# Patient Record
Sex: Female | Born: 1965 | Race: Black or African American | Hispanic: No | Smoking: Current every day smoker
Health system: Southern US, Community
[De-identification: ages and names within clinical notes are randomized; demographics above are authoritative.]

## PROBLEM LIST (undated history)

## (undated) DIAGNOSIS — N189 Chronic kidney disease, unspecified: Secondary | ICD-10-CM

## (undated) DIAGNOSIS — Z720 Tobacco use: Secondary | ICD-10-CM

## (undated) DIAGNOSIS — E039 Hypothyroidism, unspecified: Secondary | ICD-10-CM

## (undated) DIAGNOSIS — G43909 Migraine, unspecified, not intractable, without status migrainosus: Secondary | ICD-10-CM

## (undated) DIAGNOSIS — R911 Solitary pulmonary nodule: Secondary | ICD-10-CM

## (undated) DIAGNOSIS — I10 Essential (primary) hypertension: Secondary | ICD-10-CM

## (undated) DIAGNOSIS — L309 Dermatitis, unspecified: Secondary | ICD-10-CM

## (undated) DIAGNOSIS — E785 Hyperlipidemia, unspecified: Secondary | ICD-10-CM

## (undated) DIAGNOSIS — L439 Lichen planus, unspecified: Secondary | ICD-10-CM

## (undated) DIAGNOSIS — F909 Attention-deficit hyperactivity disorder, unspecified type: Secondary | ICD-10-CM

## (undated) DIAGNOSIS — E119 Type 2 diabetes mellitus without complications: Secondary | ICD-10-CM

## (undated) DIAGNOSIS — B009 Herpesviral infection, unspecified: Secondary | ICD-10-CM

## (undated) DIAGNOSIS — F329 Major depressive disorder, single episode, unspecified: Secondary | ICD-10-CM

## (undated) DIAGNOSIS — G629 Polyneuropathy, unspecified: Secondary | ICD-10-CM

## (undated) DIAGNOSIS — N951 Menopausal and female climacteric states: Secondary | ICD-10-CM

## (undated) DIAGNOSIS — F32A Depression, unspecified: Secondary | ICD-10-CM

## (undated) DIAGNOSIS — G479 Sleep disorder, unspecified: Secondary | ICD-10-CM

## (undated) DIAGNOSIS — F419 Anxiety disorder, unspecified: Secondary | ICD-10-CM

## (undated) DIAGNOSIS — J449 Chronic obstructive pulmonary disease, unspecified: Secondary | ICD-10-CM

## (undated) DIAGNOSIS — E0500 Thyrotoxicosis with diffuse goiter without thyrotoxic crisis or storm: Secondary | ICD-10-CM

## (undated) HISTORY — DX: Anxiety disorder, unspecified: F41.9

## (undated) HISTORY — DX: Thyrotoxicosis with diffuse goiter without thyrotoxic crisis or storm: E05.00

## (undated) HISTORY — DX: Attention-deficit hyperactivity disorder, unspecified type: F90.9

## (undated) HISTORY — DX: Herpesviral infection, unspecified: B00.9

## (undated) HISTORY — DX: Major depressive disorder, single episode, unspecified: F32.9

## (undated) HISTORY — PX: THYROID SURGERY: SHX805

## (undated) HISTORY — DX: Solitary pulmonary nodule: R91.1

## (undated) HISTORY — DX: Polyneuropathy, unspecified: G62.9

## (undated) HISTORY — DX: Sleep disorder, unspecified: G47.9

## (undated) HISTORY — DX: Tobacco use: Z72.0

## (undated) HISTORY — DX: Lichen planus, unspecified: L43.9

## (undated) HISTORY — DX: Chronic kidney disease, unspecified: N18.9

## (undated) HISTORY — DX: Dermatitis, unspecified: L30.9

## (undated) HISTORY — PX: FOOT SURGERY: SHX648

## (undated) HISTORY — DX: Hypothyroidism, unspecified: E03.9

## (undated) HISTORY — DX: Chronic obstructive pulmonary disease, unspecified: J44.9

## (undated) HISTORY — DX: Type 2 diabetes mellitus without complications: E11.9

## (undated) HISTORY — DX: Hyperlipidemia, unspecified: E78.5

## (undated) HISTORY — DX: Menopausal and female climacteric states: N95.1

## (undated) HISTORY — DX: Migraine, unspecified, not intractable, without status migrainosus: G43.909

## (undated) HISTORY — DX: Depression, unspecified: F32.A

## (undated) HISTORY — DX: Essential (primary) hypertension: I10

---

## 1997-11-19 ENCOUNTER — Ambulatory Visit (HOSPITAL_COMMUNITY): Admission: RE | Admit: 1997-11-19 | Discharge: 1997-11-19 | Payer: Self-pay | Admitting: *Deleted

## 2002-08-09 ENCOUNTER — Emergency Department (HOSPITAL_COMMUNITY): Admission: EM | Admit: 2002-08-09 | Discharge: 2002-08-10 | Payer: Self-pay | Admitting: *Deleted

## 2002-12-19 ENCOUNTER — Ambulatory Visit (HOSPITAL_COMMUNITY): Admission: RE | Admit: 2002-12-19 | Discharge: 2002-12-19 | Payer: Self-pay | Admitting: Family Medicine

## 2002-12-19 ENCOUNTER — Encounter: Payer: Self-pay | Admitting: Family Medicine

## 2002-12-26 ENCOUNTER — Ambulatory Visit (HOSPITAL_COMMUNITY): Admission: RE | Admit: 2002-12-26 | Discharge: 2002-12-26 | Payer: Self-pay | Admitting: Family Medicine

## 2002-12-26 ENCOUNTER — Encounter: Payer: Self-pay | Admitting: Family Medicine

## 2003-04-27 ENCOUNTER — Ambulatory Visit (HOSPITAL_COMMUNITY): Admission: RE | Admit: 2003-04-27 | Discharge: 2003-04-27 | Payer: Self-pay | Admitting: Family Medicine

## 2003-05-29 ENCOUNTER — Ambulatory Visit (HOSPITAL_COMMUNITY): Admission: RE | Admit: 2003-05-29 | Discharge: 2003-05-29 | Payer: Self-pay | Admitting: Family Medicine

## 2003-08-06 ENCOUNTER — Ambulatory Visit (HOSPITAL_COMMUNITY): Admission: RE | Admit: 2003-08-06 | Discharge: 2003-08-06 | Payer: Self-pay | Admitting: Internal Medicine

## 2004-03-04 ENCOUNTER — Ambulatory Visit: Payer: Self-pay | Admitting: Family Medicine

## 2004-03-14 ENCOUNTER — Ambulatory Visit: Payer: Self-pay | Admitting: Family Medicine

## 2004-03-29 ENCOUNTER — Ambulatory Visit: Payer: Self-pay | Admitting: Internal Medicine

## 2004-04-15 ENCOUNTER — Ambulatory Visit: Payer: Self-pay | Admitting: Family Medicine

## 2004-05-04 ENCOUNTER — Ambulatory Visit (HOSPITAL_COMMUNITY): Admission: RE | Admit: 2004-05-04 | Discharge: 2004-05-04 | Payer: Self-pay | Admitting: Family Medicine

## 2004-05-12 ENCOUNTER — Ambulatory Visit: Payer: Self-pay | Admitting: Internal Medicine

## 2004-05-16 ENCOUNTER — Encounter (HOSPITAL_COMMUNITY): Admission: RE | Admit: 2004-05-16 | Discharge: 2004-06-15 | Payer: Self-pay | Admitting: Internal Medicine

## 2004-06-03 ENCOUNTER — Ambulatory Visit: Payer: Self-pay | Admitting: Family Medicine

## 2004-08-17 ENCOUNTER — Ambulatory Visit: Payer: Self-pay | Admitting: Family Medicine

## 2004-08-19 ENCOUNTER — Ambulatory Visit (HOSPITAL_COMMUNITY): Admission: RE | Admit: 2004-08-19 | Discharge: 2004-08-19 | Payer: Self-pay | Admitting: Family Medicine

## 2004-08-23 ENCOUNTER — Ambulatory Visit (HOSPITAL_COMMUNITY): Admission: RE | Admit: 2004-08-23 | Discharge: 2004-08-23 | Payer: Self-pay | Admitting: Family Medicine

## 2004-09-08 ENCOUNTER — Ambulatory Visit: Payer: Self-pay | Admitting: Orthopedic Surgery

## 2004-11-07 ENCOUNTER — Ambulatory Visit: Payer: Self-pay | Admitting: Orthopedic Surgery

## 2004-11-15 ENCOUNTER — Encounter (HOSPITAL_COMMUNITY): Admission: RE | Admit: 2004-11-15 | Discharge: 2004-12-15 | Payer: Self-pay | Admitting: Orthopedic Surgery

## 2004-12-06 ENCOUNTER — Ambulatory Visit: Payer: Self-pay | Admitting: Family Medicine

## 2004-12-26 ENCOUNTER — Ambulatory Visit: Payer: Self-pay | Admitting: Orthopedic Surgery

## 2004-12-29 ENCOUNTER — Encounter: Payer: Self-pay | Admitting: Family Medicine

## 2004-12-29 ENCOUNTER — Ambulatory Visit (HOSPITAL_COMMUNITY): Admission: RE | Admit: 2004-12-29 | Discharge: 2004-12-29 | Payer: Self-pay | Admitting: Orthopedic Surgery

## 2005-01-05 ENCOUNTER — Ambulatory Visit: Payer: Self-pay | Admitting: Orthopedic Surgery

## 2005-01-16 ENCOUNTER — Encounter: Admission: RE | Admit: 2005-01-16 | Discharge: 2005-02-16 | Payer: Self-pay | Admitting: Orthopedic Surgery

## 2005-04-10 ENCOUNTER — Ambulatory Visit: Payer: Self-pay | Admitting: Family Medicine

## 2005-05-05 ENCOUNTER — Ambulatory Visit (HOSPITAL_COMMUNITY): Admission: RE | Admit: 2005-05-05 | Discharge: 2005-05-05 | Payer: Self-pay | Admitting: Family Medicine

## 2005-06-06 ENCOUNTER — Ambulatory Visit: Payer: Self-pay | Admitting: Family Medicine

## 2005-12-28 ENCOUNTER — Ambulatory Visit: Payer: Self-pay | Admitting: Family Medicine

## 2006-03-19 ENCOUNTER — Ambulatory Visit: Payer: Self-pay | Admitting: Family Medicine

## 2006-05-07 ENCOUNTER — Other Ambulatory Visit: Admission: RE | Admit: 2006-05-07 | Discharge: 2006-05-07 | Payer: Self-pay | Admitting: Family Medicine

## 2006-05-07 ENCOUNTER — Encounter: Payer: Self-pay | Admitting: Family Medicine

## 2006-05-07 ENCOUNTER — Ambulatory Visit: Payer: Self-pay | Admitting: Family Medicine

## 2006-05-07 LAB — CONVERTED CEMR LAB: Pap Smear: NORMAL

## 2006-06-07 ENCOUNTER — Ambulatory Visit (HOSPITAL_COMMUNITY): Admission: RE | Admit: 2006-06-07 | Discharge: 2006-06-07 | Payer: Self-pay | Admitting: Family Medicine

## 2006-07-05 ENCOUNTER — Ambulatory Visit: Payer: Self-pay | Admitting: Family Medicine

## 2006-07-06 ENCOUNTER — Encounter: Payer: Self-pay | Admitting: Family Medicine

## 2006-08-16 ENCOUNTER — Ambulatory Visit: Payer: Self-pay | Admitting: Family Medicine

## 2006-08-17 ENCOUNTER — Encounter: Payer: Self-pay | Admitting: Family Medicine

## 2006-08-17 LAB — CONVERTED CEMR LAB
ALT: 9 units/L (ref 0–35)
AST: 16 units/L (ref 0–37)
Albumin: 4 g/dL (ref 3.5–5.2)
Alkaline Phosphatase: 51 units/L (ref 39–117)
Cholesterol: 276 mg/dL — ABNORMAL HIGH (ref 0–200)
HDL: 43 mg/dL (ref >39)
Hgb A1c MFr Bld: 6.1 % (ref 4.6–6.1)
Indirect Bilirubin: 0.4 mg/dL (ref 0.0–0.9)
LDL Cholesterol: 215 mg/dL — ABNORMAL HIGH (ref 0–99)
Total Protein: 7.2 g/dL (ref 6.0–8.3)
Triglycerides: 92 mg/dL (ref <150)

## 2006-11-27 ENCOUNTER — Ambulatory Visit: Payer: Self-pay | Admitting: Family Medicine

## 2006-12-04 ENCOUNTER — Ambulatory Visit: Payer: Self-pay | Admitting: Family Medicine

## 2006-12-04 ENCOUNTER — Ambulatory Visit (HOSPITAL_COMMUNITY): Admission: RE | Admit: 2006-12-04 | Discharge: 2006-12-04 | Payer: Self-pay | Admitting: Family Medicine

## 2006-12-04 LAB — CONVERTED CEMR LAB
AST: 14 units/L (ref 0–37)
Albumin: 4.4 g/dL (ref 3.5–5.2)
BUN: 13 mg/dL (ref 6–23)
Bilirubin, Direct: 0.2 mg/dL (ref 0.0–0.3)
CO2: 16 meq/L — ABNORMAL LOW (ref 19–32)
Calcium: 10 mg/dL (ref 8.4–10.5)
Chloride: 110 meq/L (ref 96–112)
Glucose, Bld: 91 mg/dL (ref 70–99)
Hemoglobin: 15.3 g/dL — ABNORMAL HIGH (ref 12.0–15.0)
Indirect Bilirubin: 0.4 mg/dL (ref 0.0–0.9)
LDL Cholesterol: 132 mg/dL — ABNORMAL HIGH (ref 0–99)
Potassium: 3.9 meq/L (ref 3.5–5.3)
Total Bilirubin: 0.6 mg/dL (ref 0.3–1.2)
Total Protein: 8.1 g/dL (ref 6.0–8.3)
Triglycerides: 102 mg/dL (ref <150)
VLDL: 20 mg/dL (ref 0–40)

## 2006-12-05 ENCOUNTER — Encounter: Payer: Self-pay | Admitting: Family Medicine

## 2006-12-05 LAB — CONVERTED CEMR LAB: GC Probe Amp, Genital: NEGATIVE

## 2006-12-14 ENCOUNTER — Ambulatory Visit: Payer: Self-pay | Admitting: Family Medicine

## 2006-12-15 ENCOUNTER — Encounter: Payer: Self-pay | Admitting: Family Medicine

## 2006-12-27 ENCOUNTER — Ambulatory Visit: Payer: Self-pay | Admitting: Family Medicine

## 2006-12-27 LAB — CONVERTED CEMR LAB

## 2006-12-28 ENCOUNTER — Encounter: Payer: Self-pay | Admitting: Family Medicine

## 2006-12-28 LAB — CONVERTED CEMR LAB
Candida species: POSITIVE — AB
Chlamydia, DNA Probe: NEGATIVE
Trichomonal Vaginitis: NEGATIVE

## 2007-01-31 ENCOUNTER — Ambulatory Visit: Payer: Self-pay | Admitting: Family Medicine

## 2007-04-04 ENCOUNTER — Encounter: Payer: Self-pay | Admitting: Family Medicine

## 2007-04-16 ENCOUNTER — Encounter: Payer: Self-pay | Admitting: Family Medicine

## 2007-04-16 LAB — CONVERTED CEMR LAB: TSH: 6.348 microintl units/mL — ABNORMAL HIGH (ref 0.350–5.50)

## 2007-04-22 ENCOUNTER — Ambulatory Visit: Payer: Self-pay | Admitting: Family Medicine

## 2007-04-22 LAB — CONVERTED CEMR LAB
ALT: 12 units/L (ref 0–35)
AST: 15 units/L (ref 0–37)
Alkaline Phosphatase: 39 units/L (ref 39–117)
Basophils Absolute: 0 10*3/uL (ref 0.0–0.1)
Bilirubin, Direct: 0.1 mg/dL (ref 0.0–0.3)
Calcium: 9.2 mg/dL (ref 8.4–10.5)
Cholesterol: 184 mg/dL (ref 0–200)
Eosinophils Absolute: 0.1 10*3/uL (ref 0.0–0.7)
Eosinophils Relative: 1 % (ref 0–5)
Glucose, Bld: 160 mg/dL — ABNORMAL HIGH (ref 70–99)
HCT: 41.1 % (ref 36.0–46.0)
Lymphs Abs: 2.5 10*3/uL (ref 0.7–4.0)
MCV: 97.9 fL (ref 78.0–100.0)
Neutro Abs: 11.4 10*3/uL — ABNORMAL HIGH (ref 1.7–7.7)
Neutrophils Relative %: 77 % (ref 43–77)
Platelets: 302 10*3/uL (ref 150–400)
RDW: 14.6 % (ref 11.5–15.5)
Sodium: 139 meq/L (ref 135–145)
Total CHOL/HDL Ratio: 5
Triglycerides: 123 mg/dL (ref <150)
VLDL: 25 mg/dL (ref 0–40)

## 2007-04-26 ENCOUNTER — Ambulatory Visit (HOSPITAL_COMMUNITY): Admission: RE | Admit: 2007-04-26 | Discharge: 2007-04-26 | Payer: Self-pay | Admitting: Family Medicine

## 2007-05-06 ENCOUNTER — Ambulatory Visit: Payer: Self-pay | Admitting: Family Medicine

## 2007-05-24 ENCOUNTER — Ambulatory Visit: Payer: Self-pay | Admitting: Family Medicine

## 2007-05-25 ENCOUNTER — Encounter: Payer: Self-pay | Admitting: Family Medicine

## 2007-06-24 ENCOUNTER — Other Ambulatory Visit: Admission: RE | Admit: 2007-06-24 | Discharge: 2007-06-24 | Payer: Self-pay | Admitting: Family Medicine

## 2007-06-24 ENCOUNTER — Ambulatory Visit: Payer: Self-pay | Admitting: Family Medicine

## 2007-06-24 ENCOUNTER — Encounter: Payer: Self-pay | Admitting: Family Medicine

## 2007-06-25 ENCOUNTER — Encounter: Payer: Self-pay | Admitting: Family Medicine

## 2007-06-25 LAB — CONVERTED CEMR LAB
Candida species: NEGATIVE
Chlamydia, DNA Probe: NEGATIVE
GC Probe Amp, Genital: NEGATIVE
Trichomonal Vaginitis: NEGATIVE

## 2007-06-28 ENCOUNTER — Ambulatory Visit (HOSPITAL_COMMUNITY): Admission: RE | Admit: 2007-06-28 | Discharge: 2007-06-28 | Payer: Self-pay | Admitting: Family Medicine

## 2007-06-28 ENCOUNTER — Encounter: Payer: Self-pay | Admitting: Family Medicine

## 2007-06-28 DIAGNOSIS — E119 Type 2 diabetes mellitus without complications: Secondary | ICD-10-CM | POA: Insufficient documentation

## 2007-06-28 DIAGNOSIS — E1122 Type 2 diabetes mellitus with diabetic chronic kidney disease: Secondary | ICD-10-CM | POA: Insufficient documentation

## 2007-06-28 DIAGNOSIS — T7840XA Allergy, unspecified, initial encounter: Secondary | ICD-10-CM | POA: Insufficient documentation

## 2007-06-28 DIAGNOSIS — J329 Chronic sinusitis, unspecified: Secondary | ICD-10-CM | POA: Insufficient documentation

## 2007-06-28 DIAGNOSIS — E039 Hypothyroidism, unspecified: Secondary | ICD-10-CM | POA: Insufficient documentation

## 2007-06-28 DIAGNOSIS — Z720 Tobacco use: Secondary | ICD-10-CM | POA: Insufficient documentation

## 2007-08-02 ENCOUNTER — Ambulatory Visit: Payer: Self-pay | Admitting: Family Medicine

## 2007-08-02 LAB — CONVERTED CEMR LAB
Cholesterol: 168 mg/dL (ref 0–200)
HDL: 42 mg/dL (ref >39)
LDL Cholesterol: 105 mg/dL — ABNORMAL HIGH (ref 0–99)
Triglycerides: 103 mg/dL (ref <150)

## 2007-09-11 ENCOUNTER — Ambulatory Visit: Payer: Self-pay | Admitting: Family Medicine

## 2007-09-11 LAB — CONVERTED CEMR LAB: Chlamydia, DNA Probe: NEGATIVE

## 2007-09-12 ENCOUNTER — Encounter: Payer: Self-pay | Admitting: Family Medicine

## 2007-09-12 LAB — CONVERTED CEMR LAB
Candida species: NEGATIVE
Gardnerella vaginalis: NEGATIVE
Trichomonal Vaginitis: NEGATIVE

## 2007-09-26 ENCOUNTER — Ambulatory Visit: Payer: Self-pay | Admitting: Family Medicine

## 2007-10-29 ENCOUNTER — Telehealth: Payer: Self-pay | Admitting: Family Medicine

## 2007-11-04 ENCOUNTER — Encounter: Payer: Self-pay | Admitting: Family Medicine

## 2007-11-20 ENCOUNTER — Encounter: Payer: Self-pay | Admitting: Family Medicine

## 2007-11-28 ENCOUNTER — Telehealth: Payer: Self-pay | Admitting: Family Medicine

## 2007-12-05 ENCOUNTER — Telehealth: Payer: Self-pay | Admitting: Family Medicine

## 2007-12-19 ENCOUNTER — Ambulatory Visit: Payer: Self-pay | Admitting: Family Medicine

## 2007-12-19 DIAGNOSIS — R002 Palpitations: Secondary | ICD-10-CM | POA: Insufficient documentation

## 2008-01-01 ENCOUNTER — Ambulatory Visit: Payer: Self-pay | Admitting: Cardiology

## 2008-01-01 ENCOUNTER — Encounter: Payer: Self-pay | Admitting: Family Medicine

## 2008-01-02 ENCOUNTER — Telehealth: Payer: Self-pay | Admitting: Family Medicine

## 2008-01-10 ENCOUNTER — Ambulatory Visit: Payer: Self-pay | Admitting: Family Medicine

## 2008-01-10 DIAGNOSIS — N3000 Acute cystitis without hematuria: Secondary | ICD-10-CM | POA: Insufficient documentation

## 2008-01-10 LAB — CONVERTED CEMR LAB
Bilirubin Urine: NEGATIVE
Glucose, Urine, Semiquant: NEGATIVE
Protein, U semiquant: 100
Specific Gravity, Urine: 1.025
Urobilinogen, UA: 0.2
pH: 7

## 2008-01-14 ENCOUNTER — Encounter: Payer: Self-pay | Admitting: Cardiology

## 2008-01-14 ENCOUNTER — Ambulatory Visit: Payer: Self-pay | Admitting: Cardiology

## 2008-01-14 ENCOUNTER — Ambulatory Visit (HOSPITAL_COMMUNITY): Admission: RE | Admit: 2008-01-14 | Discharge: 2008-01-14 | Payer: Self-pay | Admitting: Cardiology

## 2008-01-21 ENCOUNTER — Telehealth: Payer: Self-pay | Admitting: Family Medicine

## 2008-01-28 ENCOUNTER — Telehealth: Payer: Self-pay | Admitting: Family Medicine

## 2008-02-04 ENCOUNTER — Telehealth: Payer: Self-pay | Admitting: Family Medicine

## 2008-02-17 ENCOUNTER — Encounter: Payer: Self-pay | Admitting: Family Medicine

## 2008-03-09 ENCOUNTER — Telehealth: Payer: Self-pay | Admitting: Family Medicine

## 2008-06-22 ENCOUNTER — Encounter: Payer: Self-pay | Admitting: Family Medicine

## 2008-07-10 ENCOUNTER — Ambulatory Visit (HOSPITAL_COMMUNITY): Admission: RE | Admit: 2008-07-10 | Discharge: 2008-07-10 | Payer: Self-pay | Admitting: Family Medicine

## 2009-05-28 ENCOUNTER — Ambulatory Visit: Payer: Self-pay | Admitting: Family Medicine

## 2009-05-28 DIAGNOSIS — R030 Elevated blood-pressure reading, without diagnosis of hypertension: Secondary | ICD-10-CM | POA: Insufficient documentation

## 2009-05-28 DIAGNOSIS — R82998 Other abnormal findings in urine: Secondary | ICD-10-CM | POA: Insufficient documentation

## 2009-05-28 DIAGNOSIS — R209 Unspecified disturbances of skin sensation: Secondary | ICD-10-CM | POA: Insufficient documentation

## 2009-05-28 LAB — CONVERTED CEMR LAB
Ketones, urine, test strip: NEGATIVE
Nitrite: NEGATIVE
Protein, U semiquant: NEGATIVE
Specific Gravity, Urine: 1.01
Urobilinogen, UA: 0.2

## 2009-06-07 ENCOUNTER — Telehealth: Payer: Self-pay | Admitting: Physician Assistant

## 2009-06-23 ENCOUNTER — Telehealth: Payer: Self-pay | Admitting: Physician Assistant

## 2009-06-30 ENCOUNTER — Ambulatory Visit: Payer: Self-pay | Admitting: Family Medicine

## 2009-07-04 DIAGNOSIS — M549 Dorsalgia, unspecified: Secondary | ICD-10-CM

## 2009-07-04 DIAGNOSIS — G8929 Other chronic pain: Secondary | ICD-10-CM | POA: Insufficient documentation

## 2009-07-04 DIAGNOSIS — B029 Zoster without complications: Secondary | ICD-10-CM | POA: Insufficient documentation

## 2009-07-14 ENCOUNTER — Telehealth: Payer: Self-pay | Admitting: Family Medicine

## 2010-02-28 ENCOUNTER — Encounter: Payer: Self-pay | Admitting: Family Medicine

## 2010-04-24 ENCOUNTER — Encounter: Payer: Self-pay | Admitting: Family Medicine

## 2010-05-01 LAB — CONVERTED CEMR LAB
Glucose, Bld: 150 mg/dL
Hgb A1c MFr Bld: 6.2 %

## 2010-05-03 NOTE — Assessment & Plan Note (Signed)
Summary: TALK ABOUT MRI   Vital Signs:  Patient profile:   45 year old female Height:      64 inches Weight:      150.75 pounds BMI:     25.97 O2 Sat:      97 % Pulse rate:   77 / minute Pulse rhythm:   regular Resp:     16 per minute BP sitting:   130 / 84  (left arm) Cuff size:   regular  Vitals Entered By: Everitt Amber LPN (June 30, 2009 11:19 AM)  Nutrition Counseling: Patient's BMI is greater than 25 and therefore counseled on weight management options. CC: Follow up chronic problems   CC:  Follow up chronic problems.  History of Present Illness: pt's primary concern is regarding numbness on her thigh which she nootes in recent times. She denies trauma tyo the area. 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 I will obtain the old record and follow up on this . 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 she denies symptoms of uncontrolled bloosd sugar , states she recently had a dose change in her thyroid med, she was being seen at the health dept , but plans to return here for her primamry care as soon as she is able to get her ins situation sorted out.  Current Medications (verified): 1)  Ibuprofen 800 Mg  Tabs (Ibuprofen) .... Take One Tablet By Mouth Every 8 Hours As Needed 2)  Glucophage Xr 500 Mg  Tb24 (Metformin Hcl) .... Take 1 Tablet By Mouth Two Times A Day 3)  Vytorin 10-40 Mg  Tabs (Ezetimibe-Simvastatin) .... Take 1 Tab By Mouth At Bedtime 4)  Levothyroxine Sodium 137 Mcg  Tabs (Levothyroxine Sodium) .... Take 1 Tablet By Mouth Once A Day 5)  Enpresse-28   Tabs (Levonorg-Eth Estrad Triphasic) .... Take 1 Tablet By Mouth Once A Day 6)  Mupirocin 2 %  Oint (Mupirocin) .... Uad 7)  Lovastatin 40 Mg Tabs (Lovastatin) .Marland Kitchen.. 1 At Bedtime 8)  Folgard 115-800-10 Mcg-Mcg-Mg Tabs (Folic Acid-Vit B6-Vit B12) .... Take 1  Tablet By Mouth Once A Day 9)  Peridex 0.12 % Soln (Chlorhexidine Gluconate) .... Rinse With 15cc For 30 Sec Twice Daily, Do Not Swallow. Spit After Rinsing  Allergies (verified): 1)  ! Erythromycin  Past History:  Past medical, surgical, family and social histories (including risk factors) reviewed, and no changes noted (except as noted below).  Past Medical History: ALLERGY (ICD-995.3) NICOTINE ADDICTION (ICD-305.1)  half PPD HYPOTHYROIDISM (ICD-244.9) DIABETES MELLITUS, TYPE II, CONTROLLED (ICD-250.00) Hyperlipidemia Shingles 2010  Past Surgical History: Reviewed history from 12/19/2007 and no changes required. Caesarean section 1991 2002 Partial thyroidectomy  Family History: Reviewed history from 06/28/2007 and no changes required. TWO CHILDREN MOTHER  STROKE / HYPERTENSIVE/ DIABETIC FATHER  HEALTHY ONE BROTHER HEALTHY NO SISTERS  Social History: Reviewed history from 06/28/2007 and no changes required. Single Current Smoker Alcohol use-no Drug use-no EMPLOYED   Review of Systems      See HPI General:  Complains of fatigue; denies chills and fever. Eyes:  Denies blurring and discharge. ENT:  Denies hoarseness, nasal congestion, and sinus pressure. CV:  Denies chest pain or discomfort, palpitations, and swelling of hands. Resp:  Denies cough and sputum productive. GI:  Denies change in bowel habits, constipation, diarrhea, nausea, and vomiting. GU:  Denies dysuria and urinary frequency. MS:  numb on outer side of left leg intermittently x 3 months, extending to knee burning on fire x 3 months. Derm:  Complains of itching and lesion(s); rash on shoulder x 1 week, currently, had near outbreak last Oct left breast shiongles, currently n acyclovir.Marland Kitchen Neuro:  Denies headaches and seizures. Psych:  Denies anxiety and depression. Endo:  Complains of weight change; denies excessive thirst and excessive urination; change in thyroid med in Dec , no labs since then. Heme:   Denies abnormal bruising and bleeding. Allergy:  Complains of seasonal allergies.  Physical Exam  General:  Well-developed,well-nourished,in no acute distress; alert,appropriate and cooperative throughout examination HEENT: No facial asymmetry,  EOMI, No sinus tenderness, TM's Clear, oropharynx  pink and moist.   Chest: Clear to auscultation bilaterally.  CVS: S1, S2, No murmurs, No S3.   Abd: Soft, Nontender.  ZO:[id number]  ROM spine,adequate in  hips, shoulders and knees.  Ext: No edema.   CNS: CN 2-12 intact, power tone and sensation normal throughout.   Skin: Rash on right shoulder with vesicles, some are open Psych: Good eye contact, normal affect.  Memory intact, not anxious or depressed appearing.    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  Problem # 3:  ELEVATED BLOOD PRESSURE WITHOUT DIAGNOSIS OF HYPERTENSION (ICD-796.2) Assessment: Improved  Orders: T-Basic Metabolic Panel [id number] [id number])  BP today: 130/84 Prior BP: 162/110 (05/28/2009)  Labs Reviewed: Creat: 0.98 (04/22/2007) Chol: 168 (08/02/2007)   HDL: 42 (08/02/2007)   LDL: 105 (08/02/2007)   TG: 103 (08/02/2007)  Instructed in low sodium diet (DASH Handout) and behavior modification.    Problem # 4:  NICOTINE ADDICTION (ICD-305.1) Assessment: Unchanged  Encouraged smoking cessation and discussed different methods for smoking cessation.    Problem # 5:  DIABETES MELLITUS, TYPE II, CONTROLLED (ICD-250.00) Assessment: Comment Only  Her updated medication list for this problem includes:    Glucophage Xr 500 Mg Tb24 (Metformin hcl) .Marland Kitchen... Take 1 tablet by mouth two times a day  Orders: T- Hemoglobin A1C ([id number])  Complete Medication List: 1)  Ibuprofen 800 Mg Tabs (Ibuprofen) .... Take one tablet by mouth every 8 hours as needed 2)  Glucophage Xr 500 Mg Tb24 (Metformin hcl) .... Take 1 tablet by mouth two times a day 3)  Vytorin 10-40 Mg Tabs (Ezetimibe-simvastatin) .... Take 1 tab by mouth at bedtime 4)  Levothyroxine Sodium 137 Mcg Tabs (Levothyroxine sodium) .... Take 1 tablet by mouth once a day 5)  Enpresse-28 Tabs (Levonorg-eth estrad triphasic) .... Take 1 tablet by mouth once a day 6)  Mupirocin 2 % Oint (Mupirocin) .... Uad 7)  Lovastatin 40 Mg Tabs (Lovastatin) .Marland Kitchen.. 1 at bedtime 8)  Folgard 115-800-10 Mcg-mcg-mg Tabs (Folic acid-vit b6-vit b12) .... Take 1 tablet by mouth once a day 9)  Peridex 0.12 % Soln (Chlorhexidine gluconate) .... Rinse with 15cc for 30 sec twice  daily, do not swallow. spit after rinsing 10)  Gabapentin 300 Mg Caps (Gabapentin) .... Take 1 capsule by mouth at bedtime  Other Orders: T-Hepatic Function [id number]) T-Lipid Profile [id number]) T-TSH [id number])  Patient Instructions: 1)  CPE   mid May 2)  New med for burning in leg and for pain with shingles. 3)  BMP prior to visit, ICD-9: 4)  Hepatic Panel prior to visit, ICD-9: 5)  Lipid Panel prior to visit, ICD-9:   fasting labs asap 6)  TSH prior to visit, ICD-9: 7)  HBA1C first week in may Prescriptions: GABAPENTIN 300 MG CAPS (GABAPENTIN) Take 1 capsule by mouth at bedtime  #30 x 3   Entered by:   Everitt Amber LPN   Authorized by:   Syliva Overman MD   Signed by:   Everitt Amber LPN on 57/84/6962   Method used:   Electronically to        [facility]  [address] (retail)       [address]       [place], [place]  [address]       Ph: [phone]       Fax: [phone]   RxID:   [id number]

## 2010-05-03 NOTE — Progress Notes (Signed)
Summary: WANTS MRI LEG& KNEE HURTS  Phone Note Call from Patient   Summary of Call: WENT TO DR. Burgess Estelle AND HE DID NOTHING BUT 2 XRAYS AND WILL NOT DO A MRI TOLD HER IF IT GOT WORST O COME BACK AND IF SHE WANTS A MRI DONE ASAP SHE WANTS TO KNOW WHAT IS WRONG WITH HER LEG AND KNEE SHE DOES HAVE 800.00 DED. FOR MEDICAID SHE IS WILLING TO PAY SHE JUST WANTS TO KNOW WHAT IS WRONG IT KEEPS HURTING CALL BACK AT [phone] Initial call taken by: Lind Guest,  June 23, 2009 3:14 PM  Follow-up for Phone Call        Pt is due for her follow up. (unless she has decided to continue going to the health dept)  If she is coming in for her f/u then we can discuss possibly scheduling her MRI Follow-up by: Esperanza Sheets PA,  June 23, 2009 7:41 PM

## 2010-05-03 NOTE — Letter (Signed)
Summary: PHONE NOTES  PHONE NOTES   Imported By: Lind Guest 11/23/2009 08:27:54  _____________________________________________________________________  External Attachment:    Type:   Image     Comment:   External Document

## 2010-05-03 NOTE — Letter (Signed)
Summary: MISC  MISC   Imported By: Lind Guest 11/23/2009 08:25:50  _____________________________________________________________________  External Attachment:    Type:   Image     Comment:   External Document

## 2010-05-03 NOTE — Letter (Signed)
Summary: eye exam  eye exam   Imported By: Lind Guest 02/28/2010 17:01:38  _____________________________________________________________________  External Attachment:    Type:   Image     Comment:   External Document

## 2010-05-03 NOTE — Letter (Signed)
Summary: demo  demo   Imported By: Lind Guest 11/23/2009 08:21:40  _____________________________________________________________________  External Attachment:    Type:   Image     Comment:   External Document

## 2010-05-03 NOTE — Letter (Signed)
Summary: history & physical  history & physical   Imported By: Lind Guest 11/23/2009 08:22:29  _____________________________________________________________________  External Attachment:    Type:   Image     Comment:   External Document

## 2010-05-03 NOTE — Progress Notes (Signed)
Summary: MRI  Phone Note Call from Patient   Summary of Call: WANTS YOU TO GO AHEAD AND SET UP AN MRI ON HER LEG SHE WILL PAY FOR IT SOME WAY BUT SHE IS WORIED ABOUT HER LEG GOING NUMB CALL BACK AT [phone] TO LET HER KNOW WHEN IT IS Initial call taken by: Lind Guest,  June 07, 2009 8:25 AM  Follow-up for Phone Call        Pt needs to see ortho first.  She has a referral from the health dept to see one. Follow-up by: Esperanza Sheets PA,  June 07, 2009 9:06 AM  Additional Follow-up for Phone Call Additional follow up Details #1::        patient aware Additional Follow-up by: Adella Hare LPN,  June 07, 2009 9:41 AM

## 2010-05-03 NOTE — Letter (Signed)
Summary: OFFICE NOTES  OFFICE NOTES   Imported By: Lind Guest 11/23/2009 08:26:55  _____________________________________________________________________  External Attachment:    Type:   Image     Comment:   External Document

## 2010-05-03 NOTE — Progress Notes (Signed)
Summary: mouth wash  Phone Note Call from Patient   Summary of Call: needs to speak with nurse about mouth wash. [phone] Initial call taken by: Rudene Anda,  July 14, 2009 4:44 PM  Follow-up for Phone Call        needs peridex mouthwash Follow-up by: Adella Hare LPN,  July 14, 2009 4:50 PM  Additional Follow-up for Phone Call Additional follow up Details #1::        she has had in the past, pls call pharmacy and refillx 1 Additional Follow-up by: Syliva Overman MD,  July 15, 2009 8:10 AM    Additional Follow-up for Phone Call Additional follow up Details #2::    rx sent Follow-up by: Adella Hare LPN,  July 15, 2009 8:21 AM  Prescriptions: PERIDEX 0.12 % SOLN (CHLORHEXIDINE GLUCONATE) rinse with 15cc for 30 sec twice daily, DO NOT SWALLOW. SPIT AFTER RINSING  #473 x 0   Entered by:   Adella Hare LPN   Authorized by:   Syliva Overman MD   Signed by:   Adella Hare LPN on 30/86/5784   Method used:   Faxed to ...       [facility] (retail)       [address]       [place], [place]  [address]       Ph: [phone] or [phone]       Fax: [phone]   RxID:   [id number]

## 2010-05-03 NOTE — Assessment & Plan Note (Signed)
Summary: knee and leg trouble, urine odor - room 1   Vital Signs:  Patient profile:   45 year old female Height:      64 inches Weight:      148 pounds BMI:     25.50 O2 Sat:      100 % on Room air Pulse rate:   97 / minute Resp:     16 per minute BP sitting:   162 / 110  (left arm)  Vitals Entered By: Adella Hare LPN (May 28, 2009 10:22 AM)  Serial Vital Signs/Assessments:  Time      Position  BP       Pulse  Resp  Temp     By                     156/104                        Esperanza Sheets PA  CC: left knee stiffness, left leg numbness, urine odor Is Patient Diabetic? Yes Pain Assessment Patient in pain? yes        CC:  left knee stiffness, left leg numbness, and urine odor.  History of Present Illness: Pt has been going to the health dept for her care due to no ins. She is here for sort of a second opinion. Has noticed an odor in her 1 st morning urine for about 1 mos.  Doesn't notice it any other time of the day.  Has had 2 urines checked at the health dept & no infection.  She denies any dysuria or freq.  Pt is diabetic.  She states her HgbA1c was 5.8 at the health dept Jan 2011.  She does not check her blood sugar regularly.  She denies excess thirst.  Pt also c/o numbness bilat lateral lower legs.  Now she has discomfort in her Lt knee.  Feels like something wrapped around it.  no swelling. no trauma.  hx of degen disc disease, but denies LBP x many yrs.  resolved when she lost wt. Pt was prescribed ibuprofen by the health dept & referred to orthopod but has not gone yet.    Current Medications (verified): 1)  Ibuprofen 800 Mg  Tabs (Ibuprofen) .... Take One Tablet By Mouth Every 8 Hours As Needed 2)  Glucophage Xr 500 Mg  Tb24 (Metformin Hcl) .... Take 1 Tablet By Mouth Two Times A Day 3)  Vytorin 10-40 Mg  Tabs (Ezetimibe-Simvastatin) .... Take 1 Tab By Mouth At Bedtime 4)  Levothyroxine Sodium 137 Mcg  Tabs (Levothyroxine Sodium) .... Take 1 Tablet By Mouth  Once A Day 5)  Enpresse-28   Tabs (Levonorg-Eth Estrad Triphasic) .... Take 1 Tablet By Mouth Once A Day 6)  Mupirocin 2 %  Oint (Mupirocin) .... Uad 7)  Lovastatin 40 Mg Tabs (Lovastatin) .Marland Kitchen.. 1 At Bedtime 8)  Folgard 115-800-10 Mcg-Mcg-Mg Tabs (Folic Acid-Vit B6-Vit B12) .... Take 1 Tablet By Mouth Once A Day 9)  Peridex 0.12 % Soln (Chlorhexidine Gluconate) .... Rinse With 15cc For 30 Sec Twice Daily, Do Not Swallow. Spit After Rinsing 10)  Ciprofloxacin Hcl 500 Mg Tabs (Ciprofloxacin Hcl) .... Take One Tab Two Times A Day 11)  Diflucan 150 Mg Tabs (Fluconazole) .... Take One Tab Once Daily  Allergies (verified): 1)  ! Erythromycin PMH reviewed for relevance  Review of Systems General:  Denies chills and fever. CV:  Denies chest pain or discomfort and swelling  of feet. Resp:  Denies cough and shortness of breath. GI:  Denies abdominal pain, nausea, and vomiting. GU:  Denies discharge, dysuria, hematuria, nocturia, urinary frequency, and urinary hesitancy. Neuro:  Complains of numbness; denies tingling. Endo:  Denies excessive thirst, excessive urination, and polyuria.  Physical Exam  General:  Well-developed,well-nourished,in no acute distress; alert,appropriate and cooperative throughout examination Head:  Normocephalic and atraumatic without obvious abnormalities. No apparent alopecia or balding. Ears:  External ear exam shows no significant lesions or deformities.  Otoscopic examination reveals clear canals, tympanic membranes are intact bilaterally without bulging, retraction, inflammation or discharge. Hearing is grossly normal bilaterally. Nose:  External nasal examination shows no deformity or inflammation. Nasal mucosa are pink and moist without lesions or exudates. Mouth:  Oral mucosa and oropharynx without lesions or exudates.  Teeth in good repair. Neck:  No deformities, masses, or tenderness noted. Lungs:  Normal respiratory effort, chest expands symmetrically. Lungs are  clear to auscultation, no crackles or wheezes. Heart:  Normal rate and regular rhythm. S1 and S2 normal without gallop, murmur, click, rub or other extra sounds. Msk:  normal ROM, no joint tenderness, no joint swelling, no joint instability, and no crepitation.   Pulses:  R posterior tibial normal, R dorsalis pedis normal, L posterior tibial normal, and L dorsalis pedis normal.   Extremities:  No clubbing, cyanosis, edema, or deformity noted with normal full range of motion of all joints.   Neurologic:  alert & oriented X3, strength normal in all extremities, sensation intact to light touch, gait normal, and DTRs symmetrical and normal.   Cervical Nodes:  No lymphadenopathy noted Psych:  Cognition and judgment appear intact. Alert and cooperative with normal attention span and concentration. No apparent delusions, illusions, hallucinations   Impression & Recommendations:  Problem # 1:  OTHER NONSPECIFIC FINDING EXAMINATION OF URINE (ICD-791.9) Assessment Unchanged Reassurance to pt no UTI.  As advised by the health dept recommend increase fluid uptake.  Problem # 2:  ELEVATED BLOOD PRESSURE WITHOUT DIAGNOSIS OF HYPERTENSION (ICD-796.2) Assessment: New Pt states she has never had an elevated BP before.  And even the last 2 visits shes had at the health dept in the last 1 mos her BP has been normal. Discussed with pt that this needs to be followed up.  It is up to her if she is going to have that done here, or continue to go to the health dept.  Problem # 3:  PARESTHESIA (ICD-782.0) Assessment: New of bilat LE.  Advised pt I thought the advice the health dept gave her of seeing an orthopod is good advice & I recommend the same.  Complete Medication List: 1)  Ibuprofen 800 Mg Tabs (Ibuprofen) .... Take one tablet by mouth every 8 hours as needed 2)  Glucophage Xr 500 Mg Tb24 (Metformin hcl) .... Take 1 tablet by mouth two times a day 3)  Vytorin 10-40 Mg Tabs (Ezetimibe-simvastatin) ....  Take 1 tab by mouth at bedtime 4)  Levothyroxine Sodium 137 Mcg Tabs (Levothyroxine sodium) .... Take 1 tablet by mouth once a day 5)  Enpresse-28 Tabs (Levonorg-eth estrad triphasic) .... Take 1 tablet by mouth once a day 6)  Mupirocin 2 % Oint (Mupirocin) .... Uad 7)  Lovastatin 40 Mg Tabs (Lovastatin) .Marland Kitchen.. 1 at bedtime 8)  Folgard 115-800-10 Mcg-mcg-mg Tabs (Folic acid-vit b6-vit b12) .... Take 1 tablet by mouth once a day 9)  Peridex 0.12 % Soln (Chlorhexidine gluconate) .... Rinse with 15cc for 30 sec twice daily, do not  swallow. spit after rinsing 10)  Ciprofloxacin Hcl 500 Mg Tabs (Ciprofloxacin hcl) .... Take one tab two times a day 11)  Diflucan 150 Mg Tabs (Fluconazole) .... Take one tab once daily  Other Orders: UA Dipstick W/ Micro (manual) ([id number])  Patient Instructions: 1)  Continue taking Ibuprofen as needed. 2)  Follow up with the Orthopod as the health dept suggested. 3)  Your urine is normal.  Increase your fluid intake. 4)  Your blood pressure in high today.  Follow up here, or the health dept regarding this in 2-4 wks.  Laboratory Results   Urine Tests  Date/Time Received: May 28, 2009  Date/Time Reported: May 28, 2009   Routine Urinalysis   Color: yellow Appearance: Clear Glucose: negative   (Normal Range: Negative) Bilirubin: negative   (Normal Range: Negative) Ketone: negative   (Normal Range: Negative) Spec. Gravity: 1.010   (Normal Range: 1.003-1.035) Blood: negative   (Normal Range: Negative) pH: 7.0   (Normal Range: 5.0-8.0) Protein: negative   (Normal Range: Negative) Urobilinogen: 0.2   (Normal Range: 0-1) Nitrite: negative   (Normal Range: Negative) Leukocyte Esterace: negative   (Normal Range: Negative)

## 2010-05-03 NOTE — Progress Notes (Signed)
Summary: pap results  Phone Note Call from Patient   Summary of Call: wants jamie to call about pap results. [phone] Initial call taken by: Rudene Anda,  February 04, 2008 3:11 PM  Follow-up for Phone Call        Phone Call Completed Follow-up by: Worthy Keeler LPN,  February 04, 2008 5:11 PM

## 2010-05-03 NOTE — Letter (Signed)
Summary: LABS  LABS   Imported By: Lind Guest 11/23/2009 08:25:04  _____________________________________________________________________  External Attachment:    Type:   Image     Comment:   External Document

## 2010-05-03 NOTE — Letter (Signed)
Summary: consults  consults   Imported By: Lind Guest 11/23/2009 08:16:59  _____________________________________________________________________  External Attachment:    Type:   Image     Comment:   External Document

## 2010-08-16 NOTE — Assessment & Plan Note (Signed)
[facility]                       [place] [facility]ARDIOLOG[facility] OFFICE NOTE   NAME:Kathryn Oconnell, Kathryn Oconnell                       MRN:          [id number]  DATE:01/01/2008                            DOB:          09-May-1965    REFERRING PHYSICIAN:  Milus Mallick. Lodema Hong, M.D.   REFERRING PHYSICIAN:  Milus Mallick. Lodema Hong, MD   REASON FOR CONSULTATION:  Palpitations and cardiac risk factors.   HISTORY OF PRESENT ILLNESS:  Kathryn Oconnell is a very pleasant 45 year old  woman with a history of type 2 diabetes mellitus at least since 2002,  hyperlipidemia on statin therapy, and hypothyroidism status post  surgical resection of the thyroid.  She also has an ongoing tobacco use  history, presently at one half-pack per day.  She reports finding of a  heart skip over the last several weeks in association with increased  stress.  These are not exertional and not associated with any  significant chest pain, dizziness, or syncope.  She exercises on a  treadmill approximately twice weekly, walking 45 minutes and does not  notice any palpitations or heart skipping at that time.  Her resting  electrocardiogram shows sinus rhythm with left atrial enlargement and  nonspecific ST-T wave changes, no ectopy noted.  She has not undergone  any previous risk stratification.  She states that both her grandparents  had early heart disease, although her parents did not and she has a  brother that is healthy without any major cardiac history.   ALLERGIES:  No known drug allergies.   PRESENT MEDICATIONS:  1. Metformin 500 mg p.o. b.i.d.  2. Levothyroxine 137 mcg p.o. daily.  3. Lovastatin 40 mg p.o. daily.   PAST MEDICAL HISTORY:  In addition to that outlined above includes a  cesarean section in 2002.  She has a previous history of gestational  diabetes.  No other major surgeries.  She has had a colonoscopy for  screening.  She reports hemoglobin A1c of 6.2% recently and a total  cholesterol  of 105.   FAMILY HISTORY:  Family history was reviewed significant for  cerebrovascular disease in both parents also early cardiovascular  disease in her grandparents   SOCIAL HISTORY:  The patient is divorced.  She is a Lawyer.  She smokes one  half-pack per day tobacco.  She denies any significant alcohol use.  She  has 2 children, one in college and one in the first grade.   REVIEW OF SYSTEMS:  As outlined above.  She has occasional headaches,  does wear glasses.  She had no obvious bleeding problems.  She reports  occasional reflux.  She uses Depo-Provera for contraception.  No  claudication or syncope.  Otherwise systems are negative.   PHYSICAL EXAMINATION:  VITAL SIGNS:  Blood pressure is 100/80, heart  rate is 86, weight 144 pounds, normally nourished appearing female in no  acute distress.  HEENT:  Conjunctivae are normal.  Oropharynx is clear.  NECK:  Supple.  No elevated jugular venous pressure.  No loud bruits.  No thyromegaly is noted.  LUNGS:  Clear.  No labored breathing at rest.  CARDIAC:  Regular rate and rhythm.  Soft S4.  No S3 gallop or  pericardial rub.  No pathologic systolic murmur.  ABDOMEN:  Soft and nontender.  Normoactive bowel sounds.  No  hepatomegaly.  No tenderness noted.  EXTREMITIES:  No significant pitting edema.  Distal pulses are 2+.  SKIN:  Warm and dry.  MUSCULOSKELETAL:  No kyphosis noted.  NEUROPSYCHIATRIC:  The patient is alert and oriented x3.  Affect is  appropriate.   IMPRESSION AND RECOMMENDATIONS:  History of palpitations that seem  consistent with premature ventricular complexes based on description.  This is not associated with any dizziness or syncope, however.  Her  electrocardiogram at baseline is mildly abnormal with nonspecific ST-T  wave changes and probable left atrial enlargement.  Cardiac risk factors  include ongoing tobacco use, hyperlipidemia, and a reasonably  longstanding history of diabetes mellitus.  She has not  undergone any  prior cardiac risk stratification and our plan will be a basic exercise  echocardiogram to both exclude exercise-induced dysrhythmias and also  ischemia.  If this study is normal, it would suggest a low risk for  obstructive coronary artery disease/adverse cardiac events.  Otherwise,  we will bring her back and discuss additional plans. If palpitations  otherwise progress, an event recorder might be useful.  We will hold off  on this for the time being.     Jonelle Sidle, MD  Electronically Signed    SGM/MedQ  DD: 01/01/2008  DT: 01/02/2008  Job #: [id number]   cc:   Milus Mallick. Lodema Hong, M.D.

## 2010-08-19 NOTE — H&P (Signed)
NAME:  Kathryn Oconnell, Kathryn Oconnell                          ACCOUNT NO.:  [id number]   MEDICAL RECORD NO.:  [id number]                   PATIENT TYPE:  AMB   LOCATION:  DAY                                  FACILITY:  [facility]   PHYSICIAN:  Jerolyn Shin C. Katrinka Blazing, M.D.                DATE OF BIRTH:  1966-01-26   DATE OF ADMISSION:  DATE OF DISCHARGE:                                HISTORY & PHYSICAL   A 45 year old female with a history of recurrent constipation.  She has had  constipation for at least 14 years, that has been much worse over the past  two years.  She has had no nausea, no change in appetite.  She has constant  bloating.  She has been treated with Zelnorm for one month without  improvement.  There has been no weight change.  The patient is scheduled for  colonoscopy.   PAST HISTORY:  1. She has diabetes mellitus.  2. Hypothyroidism.  3. Depression.   SURGERY:  1. Partial thyroidectomy for goiter.  2. C-section.   MEDICATIONS:  1. Synthroid 100 mcg every day.  2. Lexapro 20 mg every day.  3. Metformin 500 mg b.i.d.  4. Lipitor 40 mg every day.   FAMILY HISTORY:  Positive for hypertension, atherosclerotic heart disease,  diabetes mellitus, and a stroke.   She is a Archivist, married.  She does not drink, smoke, or use drugs.   PHYSICAL EXAMINATION:  VITAL SIGNS:  Blood pressure 110/70, pulse 80,  respirations 18, weight 180 pounds.  HEENT:  Unremarkable.  NECK:  Supple.  No JVD or bruits.  CHEST:  Clear to auscultation.  HEART:  Regular, rate and rhythm without murmur, gallop or rub.  ABDOMEN:  Soft, nontender.  No masses.  RECTAL:  Normal.  Stool guaiac negative.  EXTREMITIES:  No cyanosis, clubbing or edema.  NEUROLOGIC:  No focal motor sensory or cerebellar deficit.   IMPRESSION:  1. Chronic recurrent constipation, unresponsive to medical therapy.  2. Diabetes mellitus.  3. Hypothyroidism.  4. Depression.   PLAN:  Screening colonoscopy.     ___________________________________________                                         Dirk Dress. Katrinka Blazing, M.D.   LCS/MEDQ  D:  06/17/2003  T:  06/17/2003  Job:  [id number]

## 2010-08-19 NOTE — H&P (Signed)
Kathryn Oconnell, Kathryn Oconnell                         ACCOUNT NO.:  [id number]   MEDICAL RECORD NO.:  [id number]                   PATIENT TYPE:  AMB   LOCATION:  DAY                                  FACILITY:  [facility]   PHYSICIAN:  Jerolyn Shin C. Katrinka Blazing, M.D.                DATE OF BIRTH:  14-Apr-1965   DATE OF ADMISSION:  08/05/2003  DATE OF DISCHARGE:                                HISTORY & PHYSICAL   HISTORY OF PRESENT ILLNESS:  A 45 year old female with a history of  recurrent constipation. She has been symptomatic for at least 14 years. She  has been much worse over the past 2 years. She denies nausea and vomiting.  There has been no change in appetite. She complains of constant bloating.  She was tried with other medications without improvement. There has not been  any weight change. The patient was previously scheduled for colonoscopy on  June 18, 2003 but it had to be delayed. She remains symptomatic and she is  now rescheduled for colonoscopy.   PAST MEDICAL HISTORY:  Positive for hypothyroidism, depression, and diabetes  mellitus.   PAST SURGICAL HISTORY:  Cesarean section and partial thyroidectomy.   MEDICATIONS:  Lipitor 40 mg q.d, Metformin 5 mg b.i.d., Synthroid 100 mcg  q.d., Lexapro 20 mg q.d.   FAMILY HISTORY:  Positive for atherosclerotic heart disease, diabetes  mellitus, hypertension and stroke.   SOCIAL HISTORY:  She is married. Mother of 1 child. She is presently a  Archivist. She does not drink, smoke, or use drugs.   PHYSICAL EXAMINATION:  VITAL SIGNS:  Blood pressure 118/80, pulse 80,  respiratory rate 18, weight 182 pounds.  HEENT:  No abnormality noted.  NECK:  Supple without jugular venous distention or bruit.  CHEST:  Clear to auscultation. No rales, rubs, rhonchi or wheezes.  HEART:  Regular rate and rhythm. Without murmur, rub, or gallop.  ABDOMEN:  Soft, nontender. No masses.  RECTAL:  Normal.  No masses. Stool guaiac negative.  EXTREMITIES:  No joint  deformity. No clubbing, cyanosis, or edema.  NEUROLOGIC:  No focal motor, sensory or cerebellar deficits.   IMPRESSION:  1. Chronic constipation unresponsive to medical therapy.  2. Diabetes mellitus.  3. Hypothyroidism.  4. Depression.   PLAN:  Colonoscopy.     ___________________________________________                                         Dirk Dress. Katrinka Blazing, M.D.   LCS/MEDQ  D:  08/05/2003  T:  08/06/2003  Job:  [id number]

## 2010-08-19 NOTE — Procedures (Signed)
[name], Kathryn Oconnell                ACCOUNT NO.:  [id number]   MEDICAL RECORD NO.:  [id number]          PATIENT TYPE:  OUT   LOCATION:  RAD                           FACILITY:  [facility]   PHYSICIAN:  Gerrit Friends. Dietrich Pates, MD, FACCDATE OF BIRTH:  26-Dec-1965   DATE OF PROCEDURE:  DATE OF DISCHARGE:  01/14/2008                                ECHOCARDIOGRAM   REFERRING PHYSICIAN:  Dr. Lodema Hong.   CLINICAL DATA:  A 45 year old woman with palpitations.   1. Treadmill exercise performed to a workload of 10.1 METS and a heart      rate of 151, 85% age-predicted maximum.  Exercise discontinued due      to leg fatigue with mild dyspnea and mild generalized fatigue.  2. No significant arrhythmia; occasional PVCs throughout the study.  3. Blood pressure increased from resting value of 110/60 to 160/70      during exercise, a normal response.  4. Baseline EKG:  Normal sinus rhythm; borderline left atrial      abnormality; right ventricular conduction delay; slightly delayed R      wave progression; nonspecific T-wave abnormality.  5. Stress EKG:  Interpretation somewhat impaired by excess of      artifact; no definite abnormalities identified.  6. Baseline echocardiogram:  Normal left atrial size; normal right      ventricular size and function; normal aortic and mitral valves;      normal left ventricular size; no LVH; low normal left ventricular      systolic function.  7. Post-exercise echocardiogram:  Increased contractility in all      myocardial segments.   IMPRESSION:  Negative stress echocardiogram revealing adequate exercise  tolerance and neither electrocardiographic nor echocardiographic  evidence for ischemia or infarction.  Other findings as noted.      Gerrit Friends. Dietrich Pates, MD, [facility]  Electronically Signed     RMR/MEDQ  D:  01/16/2008  T:  01/16/2008  Job:  [id number]

## 2010-12-30 DIAGNOSIS — R079 Chest pain, unspecified: Secondary | ICD-10-CM

## 2011-10-31 DIAGNOSIS — R51 Headache: Secondary | ICD-10-CM

## 2012-01-18 ENCOUNTER — Ambulatory Visit: Payer: Self-pay | Admitting: Family Medicine

## 2012-01-22 ENCOUNTER — Ambulatory Visit (INDEPENDENT_AMBULATORY_CARE_PROVIDER_SITE_OTHER): Payer: BC Managed Care – PPO | Admitting: Family Medicine

## 2012-01-22 ENCOUNTER — Encounter: Payer: Self-pay | Admitting: Family Medicine

## 2012-01-22 VITALS — BP 118/78 | HR 92 | Resp 15 | Ht 64.0 in | Wt 157.8 lb

## 2012-01-22 DIAGNOSIS — E039 Hypothyroidism, unspecified: Secondary | ICD-10-CM

## 2012-01-22 DIAGNOSIS — F17200 Nicotine dependence, unspecified, uncomplicated: Secondary | ICD-10-CM

## 2012-01-22 DIAGNOSIS — R911 Solitary pulmonary nodule: Secondary | ICD-10-CM | POA: Insufficient documentation

## 2012-01-22 DIAGNOSIS — R1013 Epigastric pain: Secondary | ICD-10-CM

## 2012-01-22 DIAGNOSIS — J449 Chronic obstructive pulmonary disease, unspecified: Secondary | ICD-10-CM | POA: Insufficient documentation

## 2012-01-22 DIAGNOSIS — K3189 Other diseases of stomach and duodenum: Secondary | ICD-10-CM

## 2012-01-22 DIAGNOSIS — E119 Type 2 diabetes mellitus without complications: Secondary | ICD-10-CM

## 2012-01-22 MED ORDER — RANITIDINE HCL 150 MG PO TABS: 150.0000 mg | ORAL_TABLET | Freq: Every day | ORAL | Status: DC

## 2012-01-22 MED ORDER — BUPROPION HCL ER (SR) 150 MG PO TB12: 150.0000 mg | ORAL_TABLET | Freq: Two times a day (BID) | ORAL | Status: DC

## 2012-01-22 MED ORDER — METFORMIN HCL 1000 MG PO TABS: 1000.0000 mg | ORAL_TABLET | Freq: Two times a day (BID) | ORAL | Status: DC

## 2012-01-22 MED ORDER — LOVASTATIN 20 MG PO TABS: 40.0000 mg | ORAL_TABLET | Freq: Every day | ORAL | Status: DC

## 2012-01-22 MED ORDER — BENAZEPRIL HCL 10 MG PO TABS: 10.0000 mg | ORAL_TABLET | Freq: Every day | ORAL | Status: DC

## 2012-01-22 MED ORDER — LEVOTHYROXINE SODIUM 88 MCG PO TABS: 88.0000 ug | ORAL_TABLET | Freq: Every day | ORAL | Status: DC

## 2012-01-22 NOTE — Patient Instructions (Signed)
Continue current medications Set quit date Start wellbutrin take 150mg  daily x 3 days then increase to 1 tablet twice a day  Records to be obtained F/U 8 weeks for CPE with PAP Smear

## 2012-01-22 NOTE — Assessment & Plan Note (Signed)
Trial of H2 blocker

## 2012-01-22 NOTE — Assessment & Plan Note (Signed)
Obtain records from [facility] , smoking cessation discussed

## 2012-01-22 NOTE — Assessment & Plan Note (Signed)
Pt not aware she already had changes of COPD/Emphysema on CT scan, due for PFT

## 2012-01-22 NOTE — Assessment & Plan Note (Signed)
zyban sent, pt ready to quit,

## 2012-01-22 NOTE — Assessment & Plan Note (Signed)
Continue metformin, labs to be obtained, goal A1C < 7

## 2012-01-22 NOTE — Assessment & Plan Note (Signed)
Continue synthroid, labs to be obtained

## 2012-01-22 NOTE — Progress Notes (Signed)
  Subjective:    Patient ID: Kathryn Oconnell, female    DOB: [birth year], 46 y.o.   MRN: [id number]  HPI Patient here to establish care. Previous PCP [facility] department. Medications and history reviewed  History of diabetes mellitus since 2000 has been on metformin twice a day. Her last A1c was 7.5%. She's been under a lot of stress as her father passed away in Nov 22, 2022 and her mother is currently sick with breast cancer. 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  Hypothyroidism she is history of radiation for thyroid gland as well as partial thyroidectomy and is now on Synthroid. She [facility].  Due for mammogram and Pap smear.  Arrhythmia in the past she was evaluated by cardiology was diagnosed with PVCs and told to avoid caffeine.  Gas -she is noted that she has gas and pressure in her epigastrium which resolves with belching. She denies any chest pain or shortness of breath. She's not tried any over-the-counter medications this is been going on for 2 weeks.   Review of Systems  GEN- denies fatigue, fever, weight loss,weakness, recent illness HEENT- denies eye drainage, change in vision, nasal discharge, CVS- denies chest pain, palpitations RESP- denies SOB, cough, wheeze ABD- denies N/V, change in stools, abd pain GU- denies dysuria, hematuria, dribbling, incontinence MSK- denies joint pain, muscle aches, injury Neuro- denies headache, dizziness, syncope, seizure activity      Objective:   Physical Exam GEN- NAD, alert and oriented x3 HEENT- PERRL, EOMI, non injected sclera, pink conjunctiva, MMM, oropharynx clear Neck- Supple,  CVS- RRR, no murmur RESP-CTAB ABS-NABS,soft,NT,ND EXT- No edema Pulses- Radial, DP- 2+ Psych-normal mood and affect        Assessment & Plan:

## 2012-01-29 ENCOUNTER — Telehealth: Payer: Self-pay | Admitting: Family Medicine

## 2012-01-29 MED ORDER — PANTOPRAZOLE SODIUM 40 MG PO TBEC: 40.0000 mg | DELAYED_RELEASE_TABLET | Freq: Every day | ORAL | Status: DC

## 2012-01-29 NOTE — Telephone Encounter (Signed)
States the zantac isn't helping and she is still having a sucking feeling- like something is trying to come up in her throat and when she burps it goes away until the next time. Happens whether she has eaten or not. Didn't know if that was reflux too or what she needed to try for it. Neither prilosec or zantac work .

## 2012-01-29 NOTE — Telephone Encounter (Signed)
Trial of protonix, stop the zantac

## 2012-01-30 NOTE — Telephone Encounter (Signed)
Pt aware.

## 2012-02-05 ENCOUNTER — Ambulatory Visit (INDEPENDENT_AMBULATORY_CARE_PROVIDER_SITE_OTHER): Payer: BC Managed Care – PPO | Admitting: Family Medicine

## 2012-02-05 ENCOUNTER — Encounter: Payer: Self-pay | Admitting: Family Medicine

## 2012-02-05 ENCOUNTER — Telehealth: Payer: Self-pay

## 2012-02-05 VITALS — BP 130/74 | HR 77 | Temp 98.4°F | Resp 18 | Ht 64.0 in | Wt 154.0 lb

## 2012-02-05 DIAGNOSIS — G4700 Insomnia, unspecified: Secondary | ICD-10-CM

## 2012-02-05 DIAGNOSIS — F419 Anxiety disorder, unspecified: Secondary | ICD-10-CM | POA: Insufficient documentation

## 2012-02-05 DIAGNOSIS — K219 Gastro-esophageal reflux disease without esophagitis: Secondary | ICD-10-CM

## 2012-02-05 DIAGNOSIS — N183 Chronic kidney disease, stage 3 unspecified: Secondary | ICD-10-CM | POA: Insufficient documentation

## 2012-02-05 DIAGNOSIS — N289 Disorder of kidney and ureter, unspecified: Secondary | ICD-10-CM

## 2012-02-05 DIAGNOSIS — F411 Generalized anxiety disorder: Secondary | ICD-10-CM

## 2012-02-05 MED ORDER — ZOLPIDEM TARTRATE 10 MG PO TABS: 10.0000 mg | ORAL_TABLET | Freq: Every evening | ORAL | Status: DC | PRN

## 2012-02-05 MED ORDER — LORAZEPAM 0.5 MG PO TABS: 0.5000 mg | ORAL_TABLET | Freq: Three times a day (TID) | ORAL | Status: DC | PRN

## 2012-02-05 NOTE — Telephone Encounter (Signed)
Noted, pt can come in to be seen , treated last week for sinusitis

## 2012-02-05 NOTE — Assessment & Plan Note (Signed)
Plan to recheck on Thursday, she did have some contrast studies, off of Metformin currently Will also check UA for protein

## 2012-02-05 NOTE — Assessment & Plan Note (Signed)
Improved on PPI, I do not think this is the cause of her symptoms CT abd/pelvis normal, CXR neg, EKG neg

## 2012-02-05 NOTE — Assessment & Plan Note (Signed)
Her symptoms are concerning however normal exam, questions if all is related to anxiety  SHe is on wellbutrin for tobacco cessation. Will add low dose ativan and recheck Thursday to see if symptoms have stopped Interestingly, today when she discussed with her friends her stress and tried to relax she did not have any symptoms that she was having all weekend

## 2012-02-05 NOTE — Progress Notes (Signed)
  Subjective:    Patient ID: Kathryn Oconnell, female    DOB: 09-04-1965, 46 y.o.   MRN: [id number]  HPI   Pt presents with very interesting symptoms. Friday AM she awoke in the middle of the night in a panic felt like she couldn't breathe this has happened before, often she takes 2-3 benadryl doses to help her sleep. Friday she began to feel a pressure in her ear then felt like her throat was closing up, she was evaluated at [facility] ED twice in one day with negative work-up with exception of elevated Cr. She drove to [place] to see her daughter and had repeat spell, she was evaluated at [facility] twice as well, CT was repeated, CXR labs and nothing showed reason for symptoms, she was told it was anxiety. Now that she has thought about it has been under a lot of stress, never grieved her father who passed in July, mother ill with breast cancer in nursing home, brother is on drugs and asking for money from their father's estate which has been left to her. During her episodes she felt as if she is going to die. She is not sleeping well. Symptoms felt different than her typical GERD and she is taking protonix as prescribed   Review of Systems - per above  GEN- denies fatigue, fever, weight loss,weakness, recent illness HEENT- denies eye drainage, change in vision, nasal discharge, CVS- denies chest pain, palpitations RESP- denies SOB, cough, wheeze ABD- denies N/V, change in stools, abd pain GU- denies dysuria, hematuria, dribbling, incontinence MSK- denies joint pain, muscle aches, injury Neuro- denies headache, dizziness, syncope, seizure activity       Objective:   Physical Exam GEN- NAD, alert and oriented x3 HEENT- PERRL, EOMI, non injected sclera, pink conjunctiva, MMM, oropharynx clear, TM clear bilat, uvula midline Neck- Supple, no LAD CVS- RRR, no murmur RESP-CTAB ABD-NABS,soft,NT,ND EXT- No edema Pulses- Radial, DP- 2+ Psych- stressed appearing today, not overly depressed, + anxious,  no hallucinations, no SI/HI       Assessment & Plan:

## 2012-02-05 NOTE — Assessment & Plan Note (Signed)
Trial of ambien, stop benadryl

## 2012-02-05 NOTE — Patient Instructions (Signed)
Keep follow-up appointment Start ambien at bedtime  Ativan for panic/anxiety

## 2012-02-08 ENCOUNTER — Other Ambulatory Visit (HOSPITAL_COMMUNITY)
Admission: RE | Admit: 2012-02-08 | Discharge: 2012-02-08 | Disposition: A | Discharge status: Home / Self Care | Payer: BC Managed Care – PPO | Source: Ambulatory Visit | Attending: Family Medicine | Admitting: Family Medicine

## 2012-02-08 ENCOUNTER — Encounter: Payer: Self-pay | Admitting: Family Medicine

## 2012-02-08 ENCOUNTER — Ambulatory Visit (INDEPENDENT_AMBULATORY_CARE_PROVIDER_SITE_OTHER): Payer: BC Managed Care – PPO | Admitting: Family Medicine

## 2012-02-08 VITALS — BP 110/78 | HR 86 | Resp 15 | Ht 64.0 in | Wt 157.0 lb

## 2012-02-08 DIAGNOSIS — F17200 Nicotine dependence, unspecified, uncomplicated: Secondary | ICD-10-CM

## 2012-02-08 DIAGNOSIS — Z1211 Encounter for screening for malignant neoplasm of colon: Secondary | ICD-10-CM

## 2012-02-08 DIAGNOSIS — E785 Hyperlipidemia, unspecified: Secondary | ICD-10-CM

## 2012-02-08 DIAGNOSIS — E119 Type 2 diabetes mellitus without complications: Secondary | ICD-10-CM

## 2012-02-08 DIAGNOSIS — E039 Hypothyroidism, unspecified: Secondary | ICD-10-CM

## 2012-02-08 DIAGNOSIS — N289 Disorder of kidney and ureter, unspecified: Secondary | ICD-10-CM

## 2012-02-08 DIAGNOSIS — Z1239 Encounter for other screening for malignant neoplasm of breast: Secondary | ICD-10-CM

## 2012-02-08 DIAGNOSIS — Z01419 Encounter for gynecological examination (general) (routine) without abnormal findings: Secondary | ICD-10-CM

## 2012-02-08 DIAGNOSIS — F419 Anxiety disorder, unspecified: Secondary | ICD-10-CM

## 2012-02-08 DIAGNOSIS — F411 Generalized anxiety disorder: Secondary | ICD-10-CM

## 2012-02-08 DIAGNOSIS — N950 Postmenopausal bleeding: Secondary | ICD-10-CM

## 2012-02-08 DIAGNOSIS — N760 Acute vaginitis: Secondary | ICD-10-CM | POA: Insufficient documentation

## 2012-02-08 LAB — POC HEMOCCULT BLD/STL (OFFICE/1-CARD/DIAGNOSTIC): Fecal Occult Blood, POC: NEGATIVE

## 2012-02-08 NOTE — Patient Instructions (Addendum)
Get the labs done fasting We will call with results or send letter Vaginal ultrasound to be done Mammogram to be done- Call and schedule  Continue all other medications F/U 8 weeks

## 2012-02-11 ENCOUNTER — Encounter: Payer: Self-pay | Admitting: Family Medicine

## 2012-02-11 DIAGNOSIS — N950 Postmenopausal bleeding: Secondary | ICD-10-CM | POA: Insufficient documentation

## 2012-02-11 NOTE — Assessment & Plan Note (Addendum)
Ultrasound to be done Culture taken

## 2012-02-11 NOTE — Assessment & Plan Note (Signed)
Noted on labs from hospital CR, 1.8, check renal function, will need UA for protein

## 2012-02-11 NOTE — Assessment & Plan Note (Signed)
Continue prn ativan, pt stopped wellbutrin which was being used for tobacco cessation

## 2012-02-11 NOTE — Progress Notes (Signed)
  Subjective:    Patient ID: Kathryn Oconnell, female    DOB: Apr 14, 1965, 46 y.o.   MRN: [id number]  HPI Pt here for CPE. Due for Mammogram Last PAP 1 year ago Last normal menses in June 2012, however past month has had mild spotting with brown discharge like old blood.  Feeling better regarding the episodes and SOB, she stopped the wellbutrin, slept well past 2 nights with ambien. Due for fasting labs  Review of Systems  GEN- denies fatigue, fever, weight loss,weakness, recent illness HEENT- denies eye drainage, change in vision, nasal discharge, CVS- denies chest pain, palpitations RESP- denies SOB, cough, wheeze ABD- denies N/V, change in stools, abd pain GU- denies dysuria, hematuria, dribbling, incontinence MSK- denies joint pain, muscle aches, injury Neuro- denies headache, dizziness, syncope, seizure activity      Objective:   Physical Exam GEN- NAD, alert and oriented x3 Neck- supple, no thyromegaly CVS-RRR, no murmur RESP-CTAB Breast- normal symmetry, no nipple inversion,no nipple drainage, no nodules or lumps felt Nodes- no axillary nodes GU- normal external genitalia, vaginal mucosa pink and moist, cervix visualized no growth, + blood form os, minimal thin clear discharge, no CMT, no ovarian masses, uterus normal size, normal urethra Psych- not overly anxious or depressed appearing today  FOBT-neg, normal rectal tone     Assessment & Plan:     CPE- PAP smear done, Mammogram to be scheduled, immunizations UTD

## 2012-02-11 NOTE — Assessment & Plan Note (Signed)
Check A1C on metformin  

## 2012-02-11 NOTE — Assessment & Plan Note (Signed)
She is working on cessation. 

## 2012-02-12 ENCOUNTER — Telehealth: Payer: Self-pay | Admitting: Family Medicine

## 2012-02-12 NOTE — Telephone Encounter (Signed)
Patient is aware 

## 2012-02-14 ENCOUNTER — Other Ambulatory Visit (HOSPITAL_COMMUNITY): Payer: BC Managed Care – PPO

## 2012-02-16 ENCOUNTER — Ambulatory Visit (HOSPITAL_COMMUNITY): Payer: BC Managed Care – PPO

## 2012-02-16 ENCOUNTER — Other Ambulatory Visit: Payer: Self-pay | Admitting: Family Medicine

## 2012-02-16 ENCOUNTER — Telehealth: Payer: Self-pay | Admitting: Family Medicine

## 2012-02-16 ENCOUNTER — Ambulatory Visit (HOSPITAL_COMMUNITY)
Admission: RE | Admit: 2012-02-16 | Discharge: 2012-02-16 | Disposition: A | Discharge status: Home / Self Care | Payer: BC Managed Care – PPO | Source: Ambulatory Visit | Attending: Family Medicine | Admitting: Family Medicine

## 2012-02-16 DIAGNOSIS — N950 Postmenopausal bleeding: Secondary | ICD-10-CM

## 2012-02-16 DIAGNOSIS — Z1231 Encounter for screening mammogram for malignant neoplasm of breast: Secondary | ICD-10-CM | POA: Insufficient documentation

## 2012-02-16 DIAGNOSIS — Z1239 Encounter for other screening for malignant neoplasm of breast: Secondary | ICD-10-CM

## 2012-02-16 DIAGNOSIS — N83209 Unspecified ovarian cyst, unspecified side: Secondary | ICD-10-CM | POA: Insufficient documentation

## 2012-02-16 DIAGNOSIS — R9389 Abnormal findings on diagnostic imaging of other specified body structures: Secondary | ICD-10-CM

## 2012-02-16 MED ORDER — PROMETHAZINE HCL 12.5 MG PO TABS: 12.5000 mg | ORAL_TABLET | Freq: Four times a day (QID) | ORAL | Status: DC | PRN

## 2012-02-16 NOTE — Telephone Encounter (Signed)
Phenergan called in 

## 2012-02-16 NOTE — Telephone Encounter (Signed)
Pt aware.

## 2012-02-26 ENCOUNTER — Telehealth: Payer: Self-pay

## 2012-02-26 NOTE — Telephone Encounter (Signed)
She can call for a sooner appointment.  The bleeding is from the thick lining, the biopsy will let us know if there are any cancer cells causing this. For now use pads like she typically does, if it becomes very heavy call GYN.

## 2012-02-26 NOTE — Telephone Encounter (Signed)
Called and said she started bleeding on sat- bright red blood just like a period. She is going to [facility] next month to have a biopsy per [place] results. Wants to know since she hasn't had a period since Dec 2012 what this could mean. Should she get a sooner appt?

## 2012-02-26 NOTE — Telephone Encounter (Signed)
Patient aware.

## 2012-03-04 ENCOUNTER — Other Ambulatory Visit: Payer: Self-pay | Admitting: Obstetrics and Gynecology

## 2012-03-06 ENCOUNTER — Telehealth: Payer: Self-pay | Admitting: Family Medicine

## 2012-03-07 NOTE — Telephone Encounter (Signed)
Spoke with patient and she is aware that results have been received and will be reviewed by physician upon her return.

## 2012-03-07 NOTE — Telephone Encounter (Signed)
Called patient to verify what results she is referring to.

## 2012-03-15 ENCOUNTER — Telehealth: Payer: Self-pay | Admitting: Family Medicine

## 2012-03-15 MED ORDER — LORAZEPAM 0.5 MG PO TABS: 0.5000 mg | ORAL_TABLET | Freq: Three times a day (TID) | ORAL | Status: DC | PRN

## 2012-03-15 NOTE — Telephone Encounter (Signed)
Med refilled.

## 2012-03-18 ENCOUNTER — Encounter: Payer: BC Managed Care – PPO | Admitting: Family Medicine

## 2012-03-18 ENCOUNTER — Ambulatory Visit: Payer: BC Managed Care – PPO | Admitting: Family Medicine

## 2012-03-22 ENCOUNTER — Ambulatory Visit (INDEPENDENT_AMBULATORY_CARE_PROVIDER_SITE_OTHER): Payer: BC Managed Care – PPO | Admitting: Family Medicine

## 2012-03-22 ENCOUNTER — Ambulatory Visit: Payer: BC Managed Care – PPO

## 2012-03-22 ENCOUNTER — Encounter: Payer: Self-pay | Admitting: Family Medicine

## 2012-03-22 VITALS — BP 102/64 | HR 80 | Resp 18 | Ht 64.0 in | Wt 157.0 lb

## 2012-03-22 DIAGNOSIS — E785 Hyperlipidemia, unspecified: Secondary | ICD-10-CM

## 2012-03-22 DIAGNOSIS — F419 Anxiety disorder, unspecified: Secondary | ICD-10-CM

## 2012-03-22 DIAGNOSIS — E039 Hypothyroidism, unspecified: Secondary | ICD-10-CM

## 2012-03-22 DIAGNOSIS — N289 Disorder of kidney and ureter, unspecified: Secondary | ICD-10-CM

## 2012-03-22 DIAGNOSIS — F411 Generalized anxiety disorder: Secondary | ICD-10-CM

## 2012-03-22 DIAGNOSIS — E119 Type 2 diabetes mellitus without complications: Secondary | ICD-10-CM

## 2012-03-22 MED ORDER — ATORVASTATIN CALCIUM 20 MG PO TABS: 20.0000 mg | ORAL_TABLET | Freq: Every day | ORAL | Status: DC

## 2012-03-22 MED ORDER — ZOLPIDEM TARTRATE 10 MG PO TABS: 10.0000 mg | ORAL_TABLET | Freq: Every evening | ORAL | Status: DC | PRN

## 2012-03-22 NOTE — Patient Instructions (Addendum)
Referral to [facility] for therapy Start Lipitor at bedtime Watch the sugar and carbs, A1C is 7%  F/U 3 months- labs will be needed 1 week before

## 2012-03-23 ENCOUNTER — Encounter: Payer: Self-pay | Admitting: Family Medicine

## 2012-03-23 DIAGNOSIS — E782 Mixed hyperlipidemia: Secondary | ICD-10-CM | POA: Insufficient documentation

## 2012-03-23 DIAGNOSIS — E785 Hyperlipidemia, unspecified: Secondary | ICD-10-CM | POA: Insufficient documentation

## 2012-03-23 NOTE — Assessment & Plan Note (Signed)
D/C Mevacor, start lipitor

## 2012-03-23 NOTE — Assessment & Plan Note (Signed)
A1C 7%, would like for her to be closer to 6.5, discussed diet and exercise, no change to meds, she is committed to increasing activity which will also help her mood

## 2012-03-23 NOTE — Assessment & Plan Note (Signed)
She has a lot of stress and anxiety, many family problems Will refer to therapy, she is in agreement

## 2012-03-23 NOTE — Assessment & Plan Note (Signed)
Creatinine improved from 1.8 to 1.19, will monitor for now, importance of blood pressure and diabetic control

## 2012-03-23 NOTE — Progress Notes (Signed)
  Subjective:    Patient ID: Kathryn Oconnell, female    DOB: 09-Dec-1965, 46 y.o.   MRN: [id number]  HPI  Pt here to f/u labs and medications, seen by GYN for post menopausal bleeding, on Provera challenge, negative endometrial biopsy. Continues to have episodes where she feels full in the head, gets anxious and upset, ativan is being used twice a day and this stops attacks, a lot of stress at home with brother on drugs, mother sick and passing of her father which she is still grieving from.  Given antibiotics by Dentist, told her sinuses were "clogged" based on dental Panorex  Labs reviewed   Review of Systems    GEN- denies fatigue, fever, weight loss,weakness, recent illness HEENT- denies eye drainage, change in vision, nasal discharge, CVS- denies chest pain, palpitations RESP- denies SOB, cough, wheeze ABD- denies N/V, change in stools, abd pain GU- denies dysuria, hematuria, dribbling, incontinence MSK- denies joint pain, muscle aches, injury Neuro- denies headache, dizziness, syncope, seizure activity      Objective:   Physical Exam GEN- NAD, alert and oriented x3 HEENT- PERRL, EOMI, non injected sclera, pink conjunctiva, MMM, oropharynx clear CVS- RRR, no murmur RESP-CTAB EXT- No edema Pulses- Radial 2+ Psych- stressed appearing, not overly anxious or depressed       Assessment & Plan:

## 2012-03-23 NOTE — Assessment & Plan Note (Signed)
No change to meds.

## 2012-03-25 ENCOUNTER — Telehealth: Payer: Self-pay | Admitting: Family Medicine

## 2012-03-25 MED ORDER — FLUCONAZOLE 150 MG PO TABS: 150.0000 mg | ORAL_TABLET | Freq: Once | ORAL | Status: DC

## 2012-03-25 NOTE — Telephone Encounter (Signed)
Ok to send in for pt? 

## 2012-03-25 NOTE — Telephone Encounter (Signed)
Yes, with 1 refill

## 2012-04-08 ENCOUNTER — Ambulatory Visit (HOSPITAL_COMMUNITY): Payer: BC Managed Care – PPO | Admitting: Psychiatry

## 2012-04-17 ENCOUNTER — Ambulatory Visit (INDEPENDENT_AMBULATORY_CARE_PROVIDER_SITE_OTHER): Payer: BC Managed Care – PPO | Admitting: Psychiatry

## 2012-04-17 ENCOUNTER — Encounter (HOSPITAL_COMMUNITY): Payer: Self-pay | Admitting: Psychiatry

## 2012-04-17 DIAGNOSIS — F419 Anxiety disorder, unspecified: Secondary | ICD-10-CM

## 2012-04-17 DIAGNOSIS — F411 Generalized anxiety disorder: Secondary | ICD-10-CM

## 2012-04-22 ENCOUNTER — Telehealth: Payer: Self-pay | Admitting: Family Medicine

## 2012-04-23 NOTE — Telephone Encounter (Signed)
Family member given message to have Kathryn Oconnell call back

## 2012-04-25 ENCOUNTER — Ambulatory Visit: Payer: BC Managed Care – PPO | Admitting: Family Medicine

## 2012-04-25 ENCOUNTER — Ambulatory Visit (HOSPITAL_COMMUNITY): Payer: Self-pay | Admitting: Psychiatry

## 2012-04-25 MED ORDER — FLUCONAZOLE 150 MG PO TABS: 150.0000 mg | ORAL_TABLET | Freq: Once | ORAL | Status: DC

## 2012-04-25 MED ORDER — ESOMEPRAZOLE MAGNESIUM 40 MG PO CPDR: 40.0000 mg | DELAYED_RELEASE_CAPSULE | Freq: Every day | ORAL | Status: DC

## 2012-04-25 NOTE — Telephone Encounter (Signed)
Protonix not helping at all. States she used to take nexium and it was the only thing that helped. Wants the nexium sent in

## 2012-04-25 NOTE — Telephone Encounter (Signed)
nexium sent

## 2012-04-26 ENCOUNTER — Other Ambulatory Visit: Payer: Self-pay | Admitting: Family Medicine

## 2012-05-01 ENCOUNTER — Telehealth: Payer: Self-pay | Admitting: Family Medicine

## 2012-05-01 ENCOUNTER — Other Ambulatory Visit: Payer: Self-pay | Admitting: Family Medicine

## 2012-05-01 MED ORDER — LORAZEPAM 0.5 MG PO TABS: 0.5000 mg | ORAL_TABLET | Freq: Three times a day (TID) | ORAL | Status: DC | PRN

## 2012-05-01 MED ORDER — ZOLPIDEM TARTRATE 10 MG PO TABS: 10.0000 mg | ORAL_TABLET | Freq: Every evening | ORAL | Status: DC | PRN

## 2012-05-01 NOTE — Telephone Encounter (Signed)
meds refilled 

## 2012-05-03 ENCOUNTER — Ambulatory Visit (INDEPENDENT_AMBULATORY_CARE_PROVIDER_SITE_OTHER): Payer: BC Managed Care – PPO | Admitting: Psychiatry

## 2012-05-03 DIAGNOSIS — F411 Generalized anxiety disorder: Secondary | ICD-10-CM

## 2012-05-03 DIAGNOSIS — F419 Anxiety disorder, unspecified: Secondary | ICD-10-CM
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
## 2012-05-03 NOTE — Patient Instructions (Signed)
Discussed orally 

## 2012-05-06 NOTE — Patient Instructions (Signed)
Discussed orally 

## 2012-05-06 NOTE — Progress Notes (Signed)
Patient:  Kathryn Oconnell   DOB: 11/25/1965  MR Number: [id number]  Location: [facility]:  [address] [place],  [place], [address]  Start: Friday 05/03/2012 4:00 PM End: Friday 05/03/2012 4:50 PM  Provider/Observer:     Florencia Reasons, MSW, LCSW   Chief Complaint:      Chief Complaint  Patient presents with  . Anxiety    Reason For Service:    Patient is referred for services by PCP Dr. Jeanice Oconnell due to patient experiencing panic attacks. Patient reports experiencing her first panic attack in November 2013 and states going to the emergency room on 4 separate occasions in one weekend due to the panic attacks. She says the attacks were preceded by a sinus infection. She reports several stressors inn 2011/09/09 beginning with her mother being diagnosed with breast cancer in March 2013. Her father was diagnosed with stomach cancer and died 10/31/2011 due to stomach cancer. Patient was her father's caretaker during his illness. Patient started a new job on 10/07/2011. In October 2013, her mother was hospitalized. Her mother now resides in a nursing home and patient brings her to patient's home on Sundays. She reports additional stress related to her brother who is upset as their father's will gave patient the right to do what she wanted to do with the inheritance. Patient reports she refuses to give brother money as he has been a crack addict for 30 years. The patient is seen for follow up appointment today.   Interventions Strategy:  Supportive therapy  Participation Level:   Active  Participation Quality:  Appropriate      Behavioral Observation:  Casual, Alert, and Tearful.   Current Psychosocial Factors: The patient's father died in 2011/11/09. The patient's mother has poor health and resides in a nursing home.  The patient has a negative relationship with her brother.  Content of Session:   Reviewing symptoms, establishing rapport, processing feelings, discussing boundary issues in  patient's relationships  Current Status:   The patient reports continued sadness, anxiety, excessive worrying, sleep difficulty, and loss of libido.  Patient Progress:   Fair. Patient denies having any panic attacks since last session. However, she continues to experience excessive worrying along with was asked grief and loss issues. Patient reports increased thoughts about her deceased father, she expresses guilt about his death as she states she should have given him CPR when he became ill at home but also acknowledges that he was breathing at the time and CPR was not indicated. She also expresses guilt about making decision to discontine life support after doctor's informed her that father was brain dead. Patient also expresses anger with her brother as he has not been supportive and remains angry with patient. Patient also expresses frustration from extended family members as they constantly asking her for help and money. Therapist works with patient to discuss boundary issues and identify patient's pattern of interactions with her family.  Target Goals:   Establishing rapport  Last Reviewed:     Goals Addressed Today:    Establishing rapport  Impression/Diagnosis:   The patient reports beginning to experience panic attacks in November 2013. She spans several stressors in 2011-09-09 including the death of her father as well as her mother being diagnosed with breast cancer. She reports a prior history of depression and being treated with Lexapro for 6 years. Her current symptoms include sadness, anxiety, excessive worrying, sleep difficulty, and loss of libido. Diagnosis: Anxiety disorder NOS, rule out panic disorder,  Diagnosis:  Axis I:  1. Anxiety disorder             Axis II: Deferred

## 2012-05-13 ENCOUNTER — Telehealth: Payer: Self-pay | Admitting: Family Medicine

## 2012-05-13 NOTE — Telephone Encounter (Signed)
Please call and confirm with pt, Nicotrol is an inhaled tobacco product, there are not very good reviews that this works well with patients There is a nicoderm patch I can send I will continue to fill the ambien and ativan for now  If she wants nicoderm patch please send in 21mg  TTS, #30 , let her know insurance may not cover

## 2012-05-14 MED ORDER — NICOTINE 21 MG/24HR TD PT24: 1.0000 | MEDICATED_PATCH | TRANSDERMAL | Status: DC

## 2012-05-14 NOTE — Telephone Encounter (Signed)
Patient states that her insurance has a program in which they cover up to 1000 in fees to help with smoking cessation. She would like to try the patches.

## 2012-05-17 ENCOUNTER — Ambulatory Visit (HOSPITAL_COMMUNITY): Payer: Self-pay | Admitting: Psychiatry

## 2012-05-18 ENCOUNTER — Other Ambulatory Visit: Payer: Self-pay

## 2012-05-23 ENCOUNTER — Ambulatory Visit (INDEPENDENT_AMBULATORY_CARE_PROVIDER_SITE_OTHER): Payer: BC Managed Care – PPO | Admitting: Psychiatry

## 2012-05-23 DIAGNOSIS — F411 Generalized anxiety disorder: Secondary | ICD-10-CM

## 2012-05-23 DIAGNOSIS — F3289 Other specified depressive episodes: Secondary | ICD-10-CM

## 2012-05-23 DIAGNOSIS — F419 Anxiety disorder, unspecified: Secondary | ICD-10-CM
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
## 2012-05-24 NOTE — Patient Instructions (Signed)
Discussed orally 

## 2012-06-06 ENCOUNTER — Encounter: Payer: Self-pay | Admitting: Family Medicine

## 2012-06-06 ENCOUNTER — Ambulatory Visit (INDEPENDENT_AMBULATORY_CARE_PROVIDER_SITE_OTHER): Payer: BC Managed Care – PPO | Admitting: Family Medicine

## 2012-06-06 VITALS — BP 114/80 | HR 80 | Resp 16 | Wt 152.0 lb

## 2012-06-06 DIAGNOSIS — F17200 Nicotine dependence, unspecified, uncomplicated: Secondary | ICD-10-CM

## 2012-06-06 DIAGNOSIS — F32A Depression, unspecified: Secondary | ICD-10-CM

## 2012-06-06 DIAGNOSIS — F419 Anxiety disorder, unspecified: Secondary | ICD-10-CM

## 2012-06-06 DIAGNOSIS — G43909 Migraine, unspecified, not intractable, without status migrainosus: Secondary | ICD-10-CM

## 2012-06-06 DIAGNOSIS — E119 Type 2 diabetes mellitus without complications: Secondary | ICD-10-CM

## 2012-06-06 DIAGNOSIS — F411 Generalized anxiety disorder: Secondary | ICD-10-CM

## 2012-06-06 DIAGNOSIS — F3289 Other specified depressive episodes: Secondary | ICD-10-CM

## 2012-06-06 DIAGNOSIS — J019 Acute sinusitis, unspecified: Secondary | ICD-10-CM

## 2012-06-06 DIAGNOSIS — J449 Chronic obstructive pulmonary disease, unspecified: Secondary | ICD-10-CM

## 2012-06-06 DIAGNOSIS — F329 Major depressive disorder, single episode, unspecified: Secondary | ICD-10-CM

## 2012-06-06 MED ORDER — LORATADINE 10 MG PO TABS: 10.0000 mg | ORAL_TABLET | Freq: Every day | ORAL | Status: DC

## 2012-06-06 MED ORDER — BUTALBITAL-APAP-CAFFEINE 50-325-40 MG PO TABS: 1.0000 | ORAL_TABLET | Freq: Four times a day (QID) | ORAL | Status: DC | PRN

## 2012-06-06 MED ORDER — LORAZEPAM 0.5 MG PO TABS: 0.5000 mg | ORAL_TABLET | Freq: Three times a day (TID) | ORAL | Status: DC | PRN

## 2012-06-06 MED ORDER — AMOXICILLIN-POT CLAVULANATE 875-125 MG PO TABS: 1.0000 | ORAL_TABLET | Freq: Two times a day (BID) | ORAL | Status: AC

## 2012-06-06 MED ORDER — ZOLPIDEM TARTRATE 10 MG PO TABS: 10.0000 mg | ORAL_TABLET | Freq: Every evening | ORAL | Status: DC | PRN

## 2012-06-06 NOTE — Patient Instructions (Addendum)
Start antibiotics for sinus infection Take Claritin during the day Trial of fioricet for migraine Continue all other medications Get the labs done fasting Schedule your foot exam F/U 3 months
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
## 2012-06-07 ENCOUNTER — Ambulatory Visit (HOSPITAL_COMMUNITY): Payer: Self-pay | Admitting: Psychiatry

## 2012-06-07 LAB — MICROALBUMIN / CREATININE URINE RATIO
Creatinine, Urine: 27.4 mg/dL
Microalb Creat Ratio: 18.2 mg/g (ref 0.0–30.0)
Microalb, Ur: 0.5 mg/dL (ref 0.00–1.89)

## 2012-06-09 ENCOUNTER — Encounter: Payer: Self-pay | Admitting: Family Medicine

## 2012-06-09 DIAGNOSIS — J329 Chronic sinusitis, unspecified: Secondary | ICD-10-CM | POA: Insufficient documentation

## 2012-06-09 DIAGNOSIS — J019 Acute sinusitis, unspecified: Secondary | ICD-10-CM | POA: Insufficient documentation

## 2012-06-09 DIAGNOSIS — F329 Major depressive disorder, single episode, unspecified: Secondary | ICD-10-CM | POA: Insufficient documentation

## 2012-06-09 DIAGNOSIS — G43909 Migraine, unspecified, not intractable, without status migrainosus: Secondary | ICD-10-CM | POA: Insufficient documentation

## 2012-06-09 NOTE — Assessment & Plan Note (Signed)
Has not tolerated many meds, trial of fioricet

## 2012-06-09 NOTE — Assessment & Plan Note (Signed)
She is trying tobacco patches

## 2012-06-09 NOTE — Assessment & Plan Note (Signed)
Last A1C 7% December 2013, will have repeat labs On ACEI, aspirin Urine micro to be done Reschedule with podiatry

## 2012-06-09 NOTE — Assessment & Plan Note (Signed)
Fortunately she has had not difficulties, COPD changes seen on CT, she is a smoker

## 2012-06-09 NOTE — Assessment & Plan Note (Signed)
Continue with therapy  Note did not tolerate wellbutrin

## 2012-06-09 NOTE — Assessment & Plan Note (Signed)
Continue ativan, continue with therapy will not attempt to wean ativan at this time

## 2012-06-09 NOTE — Assessment & Plan Note (Signed)
Antibiotics given 

## 2012-06-14 ENCOUNTER — Other Ambulatory Visit (HOSPITAL_COMMUNITY)
Admission: RE | Admit: 2012-06-14 | Discharge: 2012-06-14 | Disposition: A | Discharge status: Home / Self Care | Payer: BC Managed Care – PPO | Source: Ambulatory Visit | Attending: Family Medicine | Admitting: Family Medicine

## 2012-06-14 ENCOUNTER — Ambulatory Visit (INDEPENDENT_AMBULATORY_CARE_PROVIDER_SITE_OTHER): Payer: BC Managed Care – PPO | Admitting: Family Medicine

## 2012-06-14 ENCOUNTER — Encounter: Payer: Self-pay | Admitting: Family Medicine

## 2012-06-14 VITALS — BP 98/68 | HR 86 | Resp 18 | Wt 150.1 lb

## 2012-06-14 DIAGNOSIS — N760 Acute vaginitis: Secondary | ICD-10-CM | POA: Insufficient documentation

## 2012-06-14 DIAGNOSIS — Z202 Contact with and (suspected) exposure to infections with a predominantly sexual mode of transmission: Secondary | ICD-10-CM

## 2012-06-14 DIAGNOSIS — A6000 Herpesviral infection of urogenital system, unspecified: Secondary | ICD-10-CM

## 2012-06-14 DIAGNOSIS — Z2089 Contact with and (suspected) exposure to other communicable diseases: Secondary | ICD-10-CM

## 2012-06-14 DIAGNOSIS — Z113 Encounter for screening for infections with a predominantly sexual mode of transmission: Secondary | ICD-10-CM | POA: Insufficient documentation

## 2012-06-14 MED ORDER — FLUCONAZOLE 150 MG PO TABS: 150.0000 mg | ORAL_TABLET | Freq: Once | ORAL | Status: AC

## 2012-06-14 MED ORDER — FLUCONAZOLE 150 MG PO TABS: 150.0000 mg | ORAL_TABLET | Freq: Once | ORAL | Status: DC

## 2012-06-14 MED ORDER — ACYCLOVIR 400 MG PO TABS: 400.0000 mg | ORAL_TABLET | Freq: Three times a day (TID) | ORAL | Status: DC

## 2012-06-14 NOTE — Patient Instructions (Signed)
Take the diflucan Do not start acyclovir unless something changes Labs to be done F/U as previous

## 2012-06-14 NOTE — Progress Notes (Signed)
  Subjective:    Patient ID: Kathryn Oconnell, female    DOB: 1965/12/11, 47 y.o.   MRN: [id number]  HPI  Patient presents with vaginal discharge and a burning sensation in vault that she noticed in the vagina a few days ago. 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 I she denies any abdominal pain or vaginal bleeding. She's here with her previous partner today as he has some questions about herpes  Review of Systems  GEN- denies fatigue, fever, weight loss,weakness, recent illness HEENT- denies eye drainage, change in vision, nasal discharge, CVS- denies chest pain, palpitations RESP- denies SOB, cough, wheeze ABD- denies N/V, change in stools, abd pain GU- denies dysuria, hematuria, dribbling, incontinence MSK- denies joint pain, muscle aches, injury Neuro- denies headache, dizziness, syncope, seizure activity      Objective:   Physical Exam GEN- NAD, alert and oriented x3   GU- normal external genitalia, vaginal mucosa pink and moist, cervix visualized no growth, no blood form os, + discharge, no CMT, left side of introitus small pupular lesion no blistering, no erythema, mild TTP         Assessment & Plan:

## 2012-06-15 DIAGNOSIS — A6000 Herpesviral infection of urogenital system, unspecified: Secondary | ICD-10-CM | POA: Insufficient documentation

## 2012-06-15 DIAGNOSIS — N760 Acute vaginitis: Secondary | ICD-10-CM | POA: Insufficient documentation

## 2012-06-15 LAB — HIV ANTIBODY (ROUTINE TESTING W REFLEX): HIV: NONREACTIVE

## 2012-06-15 NOTE — Assessment & Plan Note (Signed)
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

## 2012-06-15 NOTE — Assessment & Plan Note (Signed)
Cultures taken, treat for yeast as on antibiotics, diflucan

## 2012-06-17 ENCOUNTER — Telehealth: Payer: Self-pay | Admitting: Family Medicine

## 2012-06-17 LAB — HSV 2 ANTIBODY, IGG: HSV 2 Glycoprotein G Ab, IgG: 0.34 IV

## 2012-06-17 LAB — HSV 1 ANTIBODY, IGG: HSV 1 Glycoprotein G Ab, IgG: 5.67 IV — ABNORMAL HIGH

## 2012-06-17 NOTE — Telephone Encounter (Signed)
Patient aware that results not available yet.

## 2012-06-21 ENCOUNTER — Ambulatory Visit: Payer: BC Managed Care – PPO | Admitting: Family Medicine

## 2012-06-24 ENCOUNTER — Telehealth: Payer: Self-pay | Admitting: Family Medicine

## 2012-06-24 MED ORDER — IBUPROFEN 800 MG PO TABS: 800.0000 mg | ORAL_TABLET | Freq: Three times a day (TID) | ORAL | Status: DC | PRN

## 2012-06-24 NOTE — Telephone Encounter (Signed)
sent 

## 2012-06-24 NOTE — Telephone Encounter (Signed)
Not on med list

## 2012-06-28 ENCOUNTER — Ambulatory Visit (INDEPENDENT_AMBULATORY_CARE_PROVIDER_SITE_OTHER): Payer: BC Managed Care – PPO | Admitting: Psychiatry

## 2012-06-28 ENCOUNTER — Encounter: Payer: Self-pay | Admitting: Family Medicine

## 2012-06-28 ENCOUNTER — Ambulatory Visit: Payer: Self-pay | Admitting: Obstetrics and Gynecology

## 2012-06-28 ENCOUNTER — Encounter (INDEPENDENT_AMBULATORY_CARE_PROVIDER_SITE_OTHER): Payer: BC Managed Care – PPO | Admitting: Family Medicine

## 2012-06-28 DIAGNOSIS — F3289 Other specified depressive episodes: Secondary | ICD-10-CM

## 2012-06-28 DIAGNOSIS — R0989 Other specified symptoms and signs involving the circulatory and respiratory systems: Secondary | ICD-10-CM

## 2012-06-28 DIAGNOSIS — F329 Major depressive disorder, single episode, unspecified: Secondary | ICD-10-CM

## 2012-06-28 DIAGNOSIS — F411 Generalized anxiety disorder: Secondary | ICD-10-CM

## 2012-06-28 DIAGNOSIS — F419 Anxiety disorder, unspecified: Secondary | ICD-10-CM

## 2012-06-30 NOTE — Progress Notes (Signed)
Patient ID: Kathryn Oconnell, female   DOB: Mar 19, 1966, 47 y.o.   MRN: [id number] Pt had to reschedule appointment due to time
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
## 2012-07-02 NOTE — Patient Instructions (Signed)
Discussed orally 

## 2012-07-10 ENCOUNTER — Encounter: Payer: Self-pay | Admitting: *Deleted

## 2012-07-11 ENCOUNTER — Ambulatory Visit: Payer: Self-pay | Admitting: Obstetrics and Gynecology

## 2012-07-12 ENCOUNTER — Ambulatory Visit (HOSPITAL_COMMUNITY): Payer: Self-pay | Admitting: Psychiatry

## 2012-07-29 ENCOUNTER — Encounter: Payer: Self-pay | Admitting: Family Medicine

## 2012-07-29 ENCOUNTER — Ambulatory Visit (INDEPENDENT_AMBULATORY_CARE_PROVIDER_SITE_OTHER): Payer: BC Managed Care – PPO | Admitting: Family Medicine

## 2012-07-29 VITALS — BP 108/80 | HR 98 | Resp 15 | Wt 153.0 lb

## 2012-07-29 DIAGNOSIS — K5909 Other constipation: Secondary | ICD-10-CM

## 2012-07-29 DIAGNOSIS — K5900 Constipation, unspecified: Secondary | ICD-10-CM

## 2012-07-29 DIAGNOSIS — E785 Hyperlipidemia, unspecified: Secondary | ICD-10-CM

## 2012-07-29 DIAGNOSIS — J302 Other seasonal allergic rhinitis: Secondary | ICD-10-CM

## 2012-07-29 DIAGNOSIS — E1149 Type 2 diabetes mellitus with other diabetic neurological complication: Secondary | ICD-10-CM

## 2012-07-29 DIAGNOSIS — J309 Allergic rhinitis, unspecified: Secondary | ICD-10-CM

## 2012-07-29 DIAGNOSIS — R911 Solitary pulmonary nodule: Secondary | ICD-10-CM

## 2012-07-29 MED ORDER — LORAZEPAM 0.5 MG PO TABS: 0.5000 mg | ORAL_TABLET | Freq: Three times a day (TID) | ORAL | Status: DC | PRN

## 2012-07-29 MED ORDER — LINACLOTIDE 145 MCG PO CAPS: 145.0000 ug | ORAL_CAPSULE | Freq: Every day | ORAL | Status: DC

## 2012-07-29 MED ORDER — ESOMEPRAZOLE MAGNESIUM 40 MG PO CPDR: 40.0000 mg | DELAYED_RELEASE_CAPSULE | Freq: Every day | ORAL | Status: DC

## 2012-07-29 MED ORDER — BENAZEPRIL HCL 10 MG PO TABS: 10.0000 mg | ORAL_TABLET | Freq: Every day | ORAL | Status: DC

## 2012-07-29 MED ORDER — LORATADINE 10 MG PO TABS: 10.0000 mg | ORAL_TABLET | Freq: Every day | ORAL | Status: DC

## 2012-07-29 MED ORDER — METFORMIN HCL 1000 MG PO TABS: 1000.0000 mg | ORAL_TABLET | Freq: Two times a day (BID) | ORAL | Status: DC

## 2012-07-29 MED ORDER — LEVOTHYROXINE SODIUM 88 MCG PO TABS: 88.0000 ug | ORAL_TABLET | Freq: Every day | ORAL | Status: DC

## 2012-07-29 MED ORDER — FLUTICASONE PROPIONATE 50 MCG/ACT NA SUSP: 2.0000 | Freq: Every day | NASAL | Status: DC

## 2012-07-29 MED ORDER — ATORVASTATIN CALCIUM 20 MG PO TABS: 20.0000 mg | ORAL_TABLET | Freq: Every day | ORAL | Status: DC

## 2012-07-29 NOTE — Patient Instructions (Addendum)
Take sudafed for the next 2-3 days  Flonase for sinuses and drainage Trial Zyrtec or allegra over the counter  Labs before next visit  Trial of linzess for your bowels Appt scheduled in June

## 2012-07-29 NOTE — Progress Notes (Signed)
  Subjective:    Patient ID: Kathryn Oconnell, female    DOB: [birth year], 47 y.o.   MRN: [id number]  HPI  Patient presents with sinus pressure/congestion past few says and pain as well as allergies. She feels like her ears are stopped up. Last week she had left-sided tooth pain. She's been taking Tylenol Sinus which helped some of her symptoms. She's also taking her Claritin but has not seen much difference with her allergies. She denies any fever or shortness of breath denies cough  Due for CT of chest for lung nodules  She continues to have problems with her constipation has used MiraLAX and other over-the-counter medications is asking about the new medication Linzess  Review of Systems  GEN- denies fatigue, fever, weight loss,weakness, recent illness HEENT- denies eye drainage, change in vision,+ nasal discharge, CVS- denies chest pain, palpitations RESP- denies SOB, cough, wheeze Neuro- denies headache, dizziness, syncope, seizure activity      Objective:   Physical Exam GEN- NAD, alert and oriented x3 HEENT- PERRL, EOMI, non injected sclera, pink conjunctiva, MMM, oropharynx mild injection, TM clear bilat no effusion, + maxillary sinus tenderness, inflammed turbinates, nares dry  Neck- Supple, no LAD CVS- RRR, no murmur RESP-CTAB ABD-NABS,soft,NT,ND EXT- No edema Pulses- Radial 2+         Assessment & Plan:

## 2012-07-31 ENCOUNTER — Encounter: Payer: Self-pay | Admitting: Family Medicine

## 2012-07-31 DIAGNOSIS — J302 Other seasonal allergic rhinitis: Secondary | ICD-10-CM | POA: Insufficient documentation

## 2012-07-31 DIAGNOSIS — K5909 Other constipation: Secondary | ICD-10-CM | POA: Insufficient documentation

## 2012-07-31 NOTE — Assessment & Plan Note (Signed)
Repeat CT chest

## 2012-07-31 NOTE — Assessment & Plan Note (Signed)
Trial of linzess. 

## 2012-07-31 NOTE — Assessment & Plan Note (Signed)
Treated with antibiotics few weeks ago, more allergies and congstion. Sudafed decongest, flonase change decongestant

## 2012-08-12 ENCOUNTER — Ambulatory Visit: Payer: BC Managed Care – PPO | Admitting: Obstetrics and Gynecology

## 2012-08-19 ENCOUNTER — Ambulatory Visit (INDEPENDENT_AMBULATORY_CARE_PROVIDER_SITE_OTHER): Payer: BC Managed Care – PPO | Admitting: Obstetrics and Gynecology

## 2012-08-19 VITALS — BP 112/72 | Ht 64.0 in | Wt 148.2 lb

## 2012-08-19 DIAGNOSIS — N950 Postmenopausal bleeding: Secondary | ICD-10-CM

## 2012-08-19 LAB — BASIC METABOLIC PANEL
CO2: 25 mEq/L (ref 19–32)
Calcium: 9.9 mg/dL (ref 8.4–10.5)
Chloride: 106 mEq/L (ref 96–112)
Creat: 1.2 mg/dL — ABNORMAL HIGH (ref 0.50–1.10)
Sodium: 137 mEq/L (ref 135–145)

## 2012-08-19 LAB — CBC
HCT: 35.9 % — ABNORMAL LOW (ref 36.0–46.0)
Hemoglobin: 12.2 g/dL (ref 12.0–15.0)
MCHC: 34 g/dL (ref 30.0–36.0)
MCV: 92.5 fL (ref 78.0–100.0)
RBC: 3.88 MIL/uL (ref 3.87–5.11)
RDW: 14.9 % (ref 11.5–15.5)

## 2012-08-19 LAB — LIPID PANEL
Cholesterol: 175 mg/dL (ref 0–200)
Total CHOL/HDL Ratio: 5.5 Ratio
Triglycerides: 138 mg/dL (ref <150)
VLDL: 28 mg/dL (ref 0–40)

## 2012-08-19 MED ORDER — MEDROXYPROGESTERONE ACETATE 10 MG PO TABS: 10.0000 mg | ORAL_TABLET | Freq: Every day | ORAL | Status: DC

## 2012-08-19 NOTE — Patient Instructions (Addendum)
Please take her Provera tablets for 10 days every third month for the following year.

## 2012-08-19 NOTE — Progress Notes (Signed)
Ms. Finkle had a withdrawal bleed to her first regimen of Provera 10 mg daily x10 days. She had no withdrawal bleed to her second and third month. This appears consistent with perimenopause bleeding. She should have no significant risk of precancerous changes in the endometrium. Plan: We'll check FSH level again today 2. Will give Provera 10 mg dailys for 10 days every 3 month for the next year followup when necessary

## 2012-08-20 LAB — FOLLICLE STIMULATING HORMONE: FSH: 61.8 m[IU]/mL

## 2012-08-20 LAB — HEMOGLOBIN A1C
Hgb A1c MFr Bld: 6.7 % — ABNORMAL HIGH (ref <5.7)
Mean Plasma Glucose: 146 mg/dL — ABNORMAL HIGH (ref <117)

## 2012-09-04 ENCOUNTER — Telehealth: Payer: Self-pay | Admitting: Family Medicine

## 2012-09-04 NOTE — Telephone Encounter (Signed)
Spoke with pt, she was concerned as her daughter was told today she may have genital herpes and she  Was afraid she had passed it to her some way. Testing is pending, no actual diagnosis given  Advised it was safe for her daughter to take the medication. A like she had any contact with her open lesions on her mother it is very unlikely that she passed on general herpes to her daughter. Her daughter is sexually active

## 2012-09-04 NOTE — Telephone Encounter (Signed)
Pt wants to talk to you personally.

## 2012-09-06 ENCOUNTER — Ambulatory Visit: Payer: BC Managed Care – PPO | Admitting: Family Medicine

## 2012-09-06 ENCOUNTER — Ambulatory Visit: Payer: Self-pay | Admitting: Family Medicine

## 2012-09-12 ENCOUNTER — Encounter: Payer: Self-pay | Admitting: Family Medicine

## 2012-09-12 ENCOUNTER — Ambulatory Visit (INDEPENDENT_AMBULATORY_CARE_PROVIDER_SITE_OTHER): Payer: BC Managed Care – PPO | Admitting: Family Medicine

## 2012-09-12 VITALS — BP 114/80 | HR 68 | Temp 97.4°F | Resp 18 | Ht 63.5 in | Wt 153.0 lb

## 2012-09-12 DIAGNOSIS — K5900 Constipation, unspecified: Secondary | ICD-10-CM

## 2012-09-12 DIAGNOSIS — R911 Solitary pulmonary nodule: Secondary | ICD-10-CM

## 2012-09-12 DIAGNOSIS — E785 Hyperlipidemia, unspecified: Secondary | ICD-10-CM

## 2012-09-12 DIAGNOSIS — A6000 Herpesviral infection of urogenital system, unspecified: Secondary | ICD-10-CM

## 2012-09-12 DIAGNOSIS — F17200 Nicotine dependence, unspecified, uncomplicated: Secondary | ICD-10-CM

## 2012-09-12 DIAGNOSIS — E119 Type 2 diabetes mellitus without complications: Secondary | ICD-10-CM

## 2012-09-12 DIAGNOSIS — E039 Hypothyroidism, unspecified: Secondary | ICD-10-CM

## 2012-09-12 DIAGNOSIS — K5909 Other constipation: Secondary | ICD-10-CM

## 2012-09-12 MED ORDER — LINACLOTIDE 290 MCG PO CAPS: 1.0000 | ORAL_CAPSULE | Freq: Every day | ORAL | Status: DC

## 2012-09-12 MED ORDER — DICLOFENAC SODIUM 1 % TD GEL: TRANSDERMAL | Status: DC

## 2012-09-12 NOTE — Patient Instructions (Signed)
Continue current medications Linzess increased to 290mg  Medications refilled F/U 3 months

## 2012-09-13 ENCOUNTER — Telehealth: Payer: Self-pay | Admitting: Family Medicine

## 2012-09-13 NOTE — Telephone Encounter (Signed)
Is this ok to refill I do not see where the pt has been on it before, need your approval?

## 2012-09-14 ENCOUNTER — Telehealth: Payer: Self-pay | Admitting: Family Medicine

## 2012-09-14 MED ORDER — METFORMIN HCL 1000 MG PO TABS: 1000.0000 mg | ORAL_TABLET | Freq: Two times a day (BID) | ORAL | Status: DC

## 2012-09-14 MED ORDER — ACYCLOVIR 400 MG PO TABS: 400.0000 mg | ORAL_TABLET | Freq: Three times a day (TID) | ORAL | Status: AC

## 2012-09-14 MED ORDER — ESOMEPRAZOLE MAGNESIUM 40 MG PO CPDR: 40.0000 mg | DELAYED_RELEASE_CAPSULE | Freq: Every day | ORAL | Status: DC

## 2012-09-14 MED ORDER — LEVOTHYROXINE SODIUM 88 MCG PO TABS: 88.0000 ug | ORAL_TABLET | Freq: Every day | ORAL | Status: DC

## 2012-09-14 MED ORDER — ATORVASTATIN CALCIUM 20 MG PO TABS: 20.0000 mg | ORAL_TABLET | Freq: Every day | ORAL | Status: DC

## 2012-09-14 MED ORDER — BENAZEPRIL HCL 10 MG PO TABS: 10.0000 mg | ORAL_TABLET | Freq: Every day | ORAL | Status: DC

## 2012-09-14 NOTE — Assessment & Plan Note (Signed)
Cholesterol is much improved, continue lipitor

## 2012-09-14 NOTE — Telephone Encounter (Signed)
Please call pt and see what this request is in reference to

## 2012-09-14 NOTE — Telephone Encounter (Signed)
Please call ot about the medication request- CHlorhexadine as well as the following   1. I reviewed her labs again, the thyroid studies were left, off, this needs to be done ASAP, it can be done in [place] if needed, ordere have been placed   2. Her CT of chest for the lung nodule was not done, this also needs to be rescheduled , I am having Rob work on this

## 2012-09-14 NOTE — Progress Notes (Signed)
  Subjective:    Patient ID: MALIAKA BRASINGTON, female    DOB: 1965-11-07, 47 y.o.   MRN: [id number]  HPI  Pt here to f/u chronic medical problems. DM- did not bring meter, no hypoglycemia, no CBG > 200, no polyuria, needs meds refilled HSV- patient with outbreak currently, needs refill on acyclovir Constipation- Linzess helped but after 2 weeks, saw not change Reviewed labs from GYN- tolerating Lipitor without any concerns Tobacco- no longer using gum or patches, too much stress to think about quitting smoking Medications reviewed   Review of Systems  GEN- denies fatigue, fever, weight loss,weakness, recent illness HEENT- denies eye drainage, change in vision, nasal discharge, CVS- denies chest pain, palpitations RESP- denies SOB, cough, wheeze ABD- denies N/V, change in stools, abd pain GU- denies dysuria, hematuria, dribbling, incontinence MSK- denies joint pain, muscle aches, injury Neuro- denies headache, dizziness, syncope, seizure activity      Objective:   Physical Exam GEN- NAD, alert and oriented x3 HEENT- PERRL, EOMI, non injected sclera, pink conjunctiva, MMM, oropharynx clear Neck- Supple,  CVS- RRR, no murmur RESP-CTAB ABD-NABS,soft,NT,ND EXT- No edema Pulses- Radial, DP- 2+        Assessment & Plan:

## 2012-09-14 NOTE — Assessment & Plan Note (Signed)
Not using patches or gum, not ready to quit at this time

## 2012-09-14 NOTE — Assessment & Plan Note (Signed)
She has not had this done, will send to have this rescheduled Smoker with lung nodule seen in Oct

## 2012-09-14 NOTE — Assessment & Plan Note (Signed)
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

## 2012-09-14 NOTE — Assessment & Plan Note (Signed)
Increase linzess to 290mg 

## 2012-09-14 NOTE — Assessment & Plan Note (Signed)
On synthroid, I overlooked her TSH, T3, T4 was was not done in May, will have this done ASAP

## 2012-09-14 NOTE — Assessment & Plan Note (Signed)
Well controlled, on ASA and ACEI

## 2012-09-16 NOTE — Telephone Encounter (Signed)
Pt is aware.  

## 2012-09-16 NOTE — Telephone Encounter (Signed)
I do not prescribe the medication for any oral reasons, her dentist can call in this medication for her with the proper instructions

## 2012-09-16 NOTE — Telephone Encounter (Signed)
Pt is calling her dentist to prescribe the med

## 2012-09-16 NOTE — Telephone Encounter (Signed)
Called pt and she says its for her rescinding gumline her dentist recommended it since she can not do peroxide.

## 2012-09-18 ENCOUNTER — Ambulatory Visit (HOSPITAL_COMMUNITY)
Admission: RE | Admit: 2012-09-18 | Discharge: 2012-09-18 | Disposition: A | Discharge status: Home / Self Care | Payer: BC Managed Care – PPO | Source: Ambulatory Visit | Attending: Family Medicine | Admitting: Family Medicine

## 2012-09-18 DIAGNOSIS — R918 Other nonspecific abnormal finding of lung field: Secondary | ICD-10-CM | POA: Insufficient documentation

## 2012-09-18 DIAGNOSIS — R911 Solitary pulmonary nodule: Secondary | ICD-10-CM

## 2012-09-18 DIAGNOSIS — Z09 Encounter for follow-up examination after completed treatment for conditions other than malignant neoplasm: Secondary | ICD-10-CM | POA: Insufficient documentation

## 2012-09-23 ENCOUNTER — Encounter: Payer: Self-pay | Admitting: Physician Assistant

## 2012-09-23 ENCOUNTER — Ambulatory Visit (INDEPENDENT_AMBULATORY_CARE_PROVIDER_SITE_OTHER): Payer: BC Managed Care – PPO | Admitting: Physician Assistant

## 2012-09-23 VITALS — BP 106/76 | HR 80 | Temp 97.8°F | Resp 18 | Wt 152.0 lb

## 2012-09-23 DIAGNOSIS — R140 Abdominal distension (gaseous): Secondary | ICD-10-CM

## 2012-09-23 DIAGNOSIS — R141 Gas pain: Secondary | ICD-10-CM

## 2012-09-23 DIAGNOSIS — M719 Bursopathy, unspecified: Secondary | ICD-10-CM

## 2012-09-23 DIAGNOSIS — R110 Nausea: Secondary | ICD-10-CM

## 2012-09-23 DIAGNOSIS — M778 Other enthesopathies, not elsewhere classified: Secondary | ICD-10-CM

## 2012-09-23 DIAGNOSIS — M67919 Unspecified disorder of synovium and tendon, unspecified shoulder: Secondary | ICD-10-CM

## 2012-09-23 DIAGNOSIS — R1013 Epigastric pain: Secondary | ICD-10-CM

## 2012-09-23 DIAGNOSIS — R1011 Right upper quadrant pain: Secondary | ICD-10-CM

## 2012-09-23 LAB — CBC W/MCH & 3 PART DIFF
Hemoglobin: 13 g/dL (ref 12.0–15.0)
Lymphocytes Relative: 35 % (ref 12–46)
Lymphs Abs: 3.7 10*3/uL (ref 0.7–4.0)
MCV: 99.2 fL (ref 78.0–100.0)
Neutrophils Relative %: 57 % (ref 43–77)
Platelets: 305 10*3/uL (ref 150–400)
RBC: 3.94 MIL/uL (ref 3.87–5.11)
WBC mixed population %: 8 % (ref 3–18)
WBC: 10.6 10*3/uL — ABNORMAL HIGH (ref 4.0–10.5)

## 2012-09-23 MED ORDER — PROMETHAZINE HCL 12.5 MG PO TABS: ORAL_TABLET | ORAL | Status: DC

## 2012-09-23 NOTE — Progress Notes (Signed)
Patient ID: LUCILA KLECKA MRN: [id number], DOB: 09-27-1965, 47 y.o. Date of Encounter: @DATE @  Chief Complaint:  Chief Complaint  Patient presents with  . woke up Saturday with rt shoulder pain  decreased ROM  . c/o bad taste in mouth    HPI: 47 y.o. year old AA female  presents with c/o:  1. Has had bad taste in mouth for 4 weeks. "Aluminum taste" in beginning. Saw Dentist 09/12/12. She told dentist. Dentist just did a cleaning, said saw nothing abnormal to expalin the taste. She f/u with Dentist 09/19/12 b/c still had the taste. Dentist prescribed ZPack. Just finished it but still with bad taste in mouth.  2. Starting on Saturday 09/21/12, has been "gassy, bloated, nauseas". Has not noticed any abdominal pain or "discomfort". Has had no vomiting. Wants to avoid eating b/c knows it will cause these symtoms to occur, but also knows she has to eat routinely b/c of her diabetes. But, still, she says even with eating, she feels no "pain" or "discofort"-except just nausea, gassy, bloated.  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  She does take Nexium QD. She rarely takes NSAIDs.  3. Saturday 09/21/12 she also noticed achy pain and decreased ROM in Right Shoulder. However, this has gotten much better-Resolved. Says it hurt when she would lift/abduct the arm on Saturday.     Past Medical History  Diagnosis Date  . Hypertension   . Diabetes mellitus without complication   . Hyperlipidemia   . Hypothyroid   . Tobacco use   . Lung nodule   . COPD (chronic obstructive pulmonary disease)   . HSV-2 infection   . Depression   . Anxiety   . Graves disease      Home Meds: See attached medication section for current medication list. Any medications entered into computer today will not appear on this note's list. The medications listed below were entered prior to today. Current Outpatient Prescriptions on File Prior to Visit  Medication Sig  Dispense Refill  . acyclovir (ZOVIRAX) 400 MG tablet Take 1 tablet (400 mg total) by mouth 3 (three) times daily.  21 tablet  1  . aspirin 81 MG tablet Take 81 mg by mouth daily.      Marland Kitchen atorvastatin (LIPITOR) 20 MG tablet Take 1 tablet (20 mg total) by mouth daily.  90 tablet  1  . benazepril (LOTENSIN) 10 MG tablet Take 1 tablet (10 mg total) by mouth daily.  90 tablet  1  . butalbital-acetaminophen-caffeine (FIORICET) 50-325-40 MG per tablet Take 1-2 tablets by mouth every 6 (six) hours as needed for headache.  20 tablet  1  . cyanocobalamin 500 MCG tablet Take 500 mcg by mouth daily.      . diclofenac sodium (VOLTAREN) 1 % GEL Apply to affected area QID prn  1 Tube  3  . esomeprazole (NEXIUM) 40 MG capsule Take 1 capsule (40 mg total) by mouth daily.  90 capsule  1  . fluticasone (FLONASE) 50 MCG/ACT nasal spray Place 2 sprays into the nose daily. For sinuses/allergies  16 g  3  . folic acid (FOLVITE) 400 MCG tablet Take 400 mcg by mouth daily.      Marland Kitchen ibuprofen (ADVIL,MOTRIN) 800 MG tablet Take 1 tablet (800 mg total) by mouth every 8 (eight) hours as needed for pain.  45 tablet  2  . levothyroxine (SYNTHROID, LEVOTHROID) 88 MCG tablet Take 1 tablet (88 mcg  total) by mouth daily.  90 tablet  1  . Linaclotide 290 MCG CAPS Take 1 capsule by mouth daily.  30 capsule  6  . loratadine (CLARITIN) 10 MG tablet Take 1 tablet (10 mg total) by mouth daily.  30 tablet  2  . LORazepam (ATIVAN) 0.5 MG tablet Take 1 tablet (0.5 mg total) by mouth every 8 (eight) hours as needed for anxiety.  60 tablet  1  . medroxyPROGESTERone (PROVERA) 10 MG tablet Take 1 tablet (10 mg total) by mouth daily. Take one tablet daily by mouth for 10 consecutive days. Repeat this every 3 months for one year  10 tablet  3  . metFORMIN (GLUCOPHAGE) 1000 MG tablet Take 1 tablet (1,000 mg total) by mouth 2 (two) times daily with a meal.  180 tablet  1  . zolpidem (AMBIEN) 10 MG tablet Take 1 tablet (10 mg total) by mouth at bedtime  as needed.  30 tablet  2   No current facility-administered medications on file prior to visit.    Allergies:  Allergies  Allergen Reactions  . Erythromycin   . Imitrex (Sumatriptan)   . Sulfa Antibiotics   . Topamax (Topiramate)     Nausea and worsening    History   Social History  . Marital Status: Married    Spouse Name: N/A    Number of Children: N/A  . Years of Education: N/A   Occupational History  . Not on file.   Social History Main Topics  . Smoking status: Current Every Day Smoker -- 0.50 packs/day    Types: Cigarettes  . Smokeless tobacco: Never Used  . Alcohol Use: No  . Drug Use: No  . Sexually Active: Yes    Birth Control/ Protection: Surgical   Other Topics Concern  . Not on file   Social History Narrative  . No narrative on file    Family History  Problem Relation Age of Onset  . Stroke Mother   . Heart disease Mother   . Cancer Mother     breast   . Thyroid disease Mother   . Hyperlipidemia Mother   . Heart disease Father   . Cancer Father     stomach cancer   . Hypertension Father   . Heart attack Father   . Gout Brother      Review of Systems:  See HPI for pertinent ROS. All other ROS negative.    Physical Exam: Blood pressure 106/76, pulse 80, temperature 97.8 F (36.6 C), temperature source Oral, resp. rate 18, weight 152 lb (68.947 kg)., Body mass index is 26.5 kg/(m^2). General: WNWD AAF. Appears in no acute distress. Lungs: Clear bilaterally to auscultation without wheezes, rales, or rhonchi. Breathing is unlabored. Heart: RRR with S1 S2. No murmurs, rubs, or gallops. Abdomen: Normal Bowel Sounds. Moderate tenderness with palpation in Epigastric Region and Right Upper Quadrant both. No other area of tenderness. No mass or organomegaly.  Musculoskeletal:  Strength and tone normal for age.Right Shoulder: No tenderness with palpation. ROM is now intact. Can fully abduct today (she says she could not do this on Saturday). Forearm  Strength 5/5 with abduction and adduction both.  Extremities/Skin: Warm and dry. No clubbing or cyanosis. No edema. No rashes or suspicious lesions. Neuro: Alert and oriented X 3. Moves all extremities spontaneously. Gait is normal. CNII-XII grossly in tact. Psych:  Responds to questions appropriately with a normal affect.     ASSESSMENT AND PLAN:  47 y.o. year old female  with  1. Nausea alone - CBC w/[facility] & 3 Part Diff - COMPLETE METABOLIC PANEL WITH GFR - Helicobacter pylori abs-IgG+IgA, bld - US Abdomen Limited RUQ; Future - promethazine (PHENERGAN) 12.5 MG tablet; TAKE ONE TABLET BY MOUTH EVERY 6 HOURS AS NEEDED FOR NAUSEA  Dispense: 30 tablet; Refill: 0  2. Bloated abdomen - CBC w/[facility] & 3 Part Diff - COMPLETE METABOLIC PANEL WITH GFR - Helicobacter pylori abs-IgG+IgA, bld  3. Abdominal pain, right upper quadrant - CBC w/[facility] & 3 Part Diff - COMPLETE METABOLIC PANEL WITH GFR - Helicobacter pylori abs-IgG+IgA, bld - US Abdomen Limited RUQ; Future  4. Abdominal pain, epigastric - CBC w/[facility] & 3 Part Diff - COMPLETE METABOLIC PANEL WITH GFR - Amylase - Lipase - Helicobacter pylori abs-IgG+IgA, bld  5. Father with h/o Stomach Cancer.  Will obtain in-house CBC and review while pt here at office to make sure WBC nml. Will send out other labs. Will obtain U/S to evaluate gallbladder.  If these tests are negative/nondiagnostic, will obtain CT Abdomen, given symptoms and family h/o father having stomach cancer.  Stay off NSAIDs. Continue Nexium. Add Phenergan to use prn.   6. Tendonitis of shoulder, right Resolved. Cont ROM exercises.   Signed, [address] [place], [place], [facility] 09/23/2012 4:20 PM

## 2012-09-24 ENCOUNTER — Telehealth: Payer: Self-pay | Admitting: Family Medicine

## 2012-09-24 ENCOUNTER — Encounter (HOSPITAL_COMMUNITY): Payer: Self-pay | Admitting: *Deleted

## 2012-09-24 ENCOUNTER — Other Ambulatory Visit: Payer: Self-pay

## 2012-09-24 ENCOUNTER — Emergency Department (HOSPITAL_COMMUNITY)
Admission: EM | Admit: 2012-09-24 | Discharge: 2012-09-24 | Disposition: A | Discharge status: Home / Self Care | Payer: BC Managed Care – PPO | Attending: Emergency Medicine | Admitting: Emergency Medicine

## 2012-09-24 DIAGNOSIS — Z8619 Personal history of other infectious and parasitic diseases: Secondary | ICD-10-CM | POA: Insufficient documentation

## 2012-09-24 DIAGNOSIS — M25519 Pain in unspecified shoulder: Secondary | ICD-10-CM | POA: Insufficient documentation

## 2012-09-24 DIAGNOSIS — Z7982 Long term (current) use of aspirin: Secondary | ICD-10-CM | POA: Insufficient documentation

## 2012-09-24 DIAGNOSIS — R109 Unspecified abdominal pain: Secondary | ICD-10-CM

## 2012-09-24 DIAGNOSIS — M2550 Pain in unspecified joint: Secondary | ICD-10-CM | POA: Insufficient documentation

## 2012-09-24 DIAGNOSIS — R1011 Right upper quadrant pain: Secondary | ICD-10-CM | POA: Insufficient documentation

## 2012-09-24 DIAGNOSIS — M25569 Pain in unspecified knee: Secondary | ICD-10-CM | POA: Insufficient documentation

## 2012-09-24 DIAGNOSIS — F17200 Nicotine dependence, unspecified, uncomplicated: Secondary | ICD-10-CM | POA: Insufficient documentation

## 2012-09-24 DIAGNOSIS — E875 Hyperkalemia: Secondary | ICD-10-CM

## 2012-09-24 DIAGNOSIS — I10 Essential (primary) hypertension: Secondary | ICD-10-CM | POA: Insufficient documentation

## 2012-09-24 DIAGNOSIS — F411 Generalized anxiety disorder: Secondary | ICD-10-CM | POA: Insufficient documentation

## 2012-09-24 DIAGNOSIS — F329 Major depressive disorder, single episode, unspecified: Secondary | ICD-10-CM | POA: Insufficient documentation

## 2012-09-24 DIAGNOSIS — E039 Hypothyroidism, unspecified: Secondary | ICD-10-CM | POA: Insufficient documentation

## 2012-09-24 DIAGNOSIS — E785 Hyperlipidemia, unspecified: Secondary | ICD-10-CM | POA: Insufficient documentation

## 2012-09-24 DIAGNOSIS — E0500 Thyrotoxicosis with diffuse goiter without thyrotoxic crisis or storm: Secondary | ICD-10-CM | POA: Insufficient documentation

## 2012-09-24 DIAGNOSIS — J449 Chronic obstructive pulmonary disease, unspecified: Secondary | ICD-10-CM | POA: Insufficient documentation

## 2012-09-24 DIAGNOSIS — J4489 Other specified chronic obstructive pulmonary disease: Secondary | ICD-10-CM | POA: Insufficient documentation

## 2012-09-24 DIAGNOSIS — F3289 Other specified depressive episodes: Secondary | ICD-10-CM | POA: Insufficient documentation

## 2012-09-24 DIAGNOSIS — R110 Nausea: Secondary | ICD-10-CM | POA: Insufficient documentation

## 2012-09-24 DIAGNOSIS — Z79899 Other long term (current) drug therapy: Secondary | ICD-10-CM | POA: Insufficient documentation

## 2012-09-24 DIAGNOSIS — E119 Type 2 diabetes mellitus without complications: Secondary | ICD-10-CM | POA: Insufficient documentation

## 2012-09-24 LAB — COMPREHENSIVE METABOLIC PANEL
ALT: 19 U/L (ref 0–35)
AST: 33 U/L (ref 0–37)
Albumin: 4.1 g/dL (ref 3.5–5.2)
CO2: 28 mEq/L (ref 19–32)
Chloride: 98 mEq/L (ref 96–112)
Creatinine, Ser: 1.36 mg/dL — ABNORMAL HIGH (ref 0.50–1.10)
GFR calc non Af Amer: 46 mL/min — ABNORMAL LOW (ref >90)
Potassium: 4.7 mEq/L (ref 3.5–5.1)
Sodium: 138 mEq/L (ref 135–145)
Total Bilirubin: 0.3 mg/dL (ref 0.3–1.2)
Total Protein: 8.3 g/dL (ref 6.0–8.3)

## 2012-09-24 LAB — URINALYSIS, ROUTINE W REFLEX MICROSCOPIC
Bilirubin Urine: NEGATIVE
Glucose, UA: NEGATIVE mg/dL
Hgb urine dipstick: NEGATIVE
Leukocytes, UA: NEGATIVE
Protein, ur: NEGATIVE mg/dL
Specific Gravity, Urine: 1.01 (ref 1.005–1.030)
pH: 7.5 (ref 5.0–8.0)

## 2012-09-24 LAB — CBC
MCV: 97.1 fL (ref 78.0–100.0)
Platelets: 357 10*3/uL (ref 150–400)
RBC: 4.2 MIL/uL (ref 3.87–5.11)
RDW: 13.6 % (ref 11.5–15.5)
WBC: 9.4 10*3/uL (ref 4.0–10.5)

## 2012-09-24 LAB — COMPLETE METABOLIC PANEL WITH GFR
ALT: 16 U/L (ref 0–35)
AST: 22 U/L (ref 0–37)
Albumin: 4.5 g/dL (ref 3.5–5.2)
Alkaline Phosphatase: 63 U/L (ref 39–117)
BUN: 20 mg/dL (ref 6–23)
Calcium: 10.1 mg/dL (ref 8.4–10.5)
GFR, Est Non African American: 35 mL/min — ABNORMAL LOW
Glucose, Bld: 115 mg/dL — ABNORMAL HIGH (ref 70–99)
Potassium: 6.5 mEq/L (ref 3.5–5.3)
Sodium: 139 mEq/L (ref 135–145)
Total Bilirubin: 0.3 mg/dL (ref 0.3–1.2)

## 2012-09-24 LAB — HELICOBACTER PYLORI ABS-IGG+IGA, BLD
H Pylori IgG: 0.57 {ISR}
HELICOBACTER PYLORI AB, IGA: 2.8 U/mL (ref <9.0)

## 2012-09-24 LAB — AMYLASE: Amylase: 148 U/L — ABNORMAL HIGH (ref 0–105)

## 2012-09-24 MED ORDER — HYDROCODONE-ACETAMINOPHEN 7.5-325 MG/15ML PO SOLN: 10.0000 mL | Freq: Once | ORAL | Status: DC

## 2012-09-24 MED ORDER — HYDROCODONE-ACETAMINOPHEN 5-325 MG PO TABS: 2.0000 | ORAL_TABLET | Freq: Once | ORAL | Status: DC

## 2012-09-24 MED ORDER — SODIUM POLYSTYRENE SULFONATE 15 GM/60ML PO SUSP: 30.0000 g | Freq: Once | ORAL | Status: DC

## 2012-09-24 NOTE — Telephone Encounter (Signed)
Since the shoulder is better I would advise against getting any kind of injections If the pain worsens she can be referred to orthopedics for evaluation Okay to write note for work I will sign off she can pick up tomorrows

## 2012-09-24 NOTE — ED Provider Notes (Signed)
History    CSN: [id number] Arrival date & time 09/24/12  1551  First MD Initiated Contact with Patient 09/24/12 1754     Chief Complaint  Patient presents with  . Shoulder Pain  . Knee Pain  . Abdominal Pain   (Consider location/radiation/quality/duration/timing/severity/associated sxs/prior Treatment) HPI  Complains of right upper quadrant pain for approximately week worse with eating also complains of right shoulder pain for approximately week worse with moving her right shoulder. She admits to nausea denies vomiting no fever. Last bowel movement yesterday, normal. No chest pain no shortness of breath treated with Tylenol and Advil, without relief. Call her primary care physician who has arranged for an abdominal ultrasound as an outpatient, and evaluation in the office for later this week. Abdominal Pain is worse with eating not improved by anything. Right shoulder pain is worse with moving his shoulder and improved with remaining still. Shows a complains of left knee pain for several weeks which is worse with moving her knee. No other complaint. No other associated symptoms. Patient reports being treated with Kayexalate yesterday for hyperkalemia by her PMD, Dr. Jeanice Lim. Past Medical History  Diagnosis Date  . Hypertension   . Diabetes mellitus without complication   . Hyperlipidemia   . Hypothyroid   . Tobacco use   . Lung nodule   . COPD (chronic obstructive pulmonary disease)   . HSV-2 infection   . Depression   . Anxiety   . Graves disease    Past Surgical History  Procedure Laterality Date  . Cesarean section    . Thyroid surgery    . Foot surgery      5th digit left foot / Great toe, 2ND TOE   Family History  Problem Relation Age of Onset  . Stroke Mother   . Heart disease Mother   . Cancer Mother     breast   . Thyroid disease Mother   . Hyperlipidemia Mother   . Heart disease Father   . Cancer Father     stomach cancer   . Hypertension Father   . Heart  attack Father   . Gout Brother    History  Substance Use Topics  . Smoking status: Current Every Day Smoker -- 0.50 packs/day    Types: Cigarettes  . Smokeless tobacco: Never Used  . Alcohol Use: No   OB History   Grav Para Term Preterm Abortions TAB SAB Ect Mult Living   6 2   4  4   1      Review of Systems  Constitutional: Negative.   HENT: Negative.   Respiratory: Negative.   Cardiovascular: Negative.   Gastrointestinal: Positive for nausea and abdominal pain.  Musculoskeletal: Positive for arthralgias.  Skin: Negative.   Allergic/Immunologic: Positive for immunocompromised state.       Diabetic  Neurological: Negative.   Psychiatric/Behavioral: Negative.   All other systems reviewed and are negative.    Allergies  Erythromycin; Imitrex; Sulfa antibiotics; and Topamax  Home Medications   Current Outpatient Rx  Name  Route  Sig  Dispense  Refill  . acyclovir (ZOVIRAX) 400 MG tablet   Oral   Take 1 tablet (400 mg total) by mouth 3 (three) times daily.   21 tablet   1   . aspirin 81 MG tablet   Oral   Take 81 mg by mouth daily.         Marland Kitchen atorvastatin (LIPITOR) 20 MG tablet   Oral   Take 1 tablet (  20 mg total) by mouth daily.   90 tablet   1   . benazepril (LOTENSIN) 10 MG tablet   Oral   Take 1 tablet (10 mg total) by mouth daily.   90 tablet   1   . butalbital-acetaminophen-caffeine (FIORICET) 50-325-40 MG per tablet   Oral   Take 1-2 tablets by mouth every 6 (six) hours as needed for headache.   20 tablet   1   . cyanocobalamin 500 MCG tablet   Oral   Take 500 mcg by mouth daily.         . diclofenac sodium (VOLTAREN) 1 % GEL      Apply to affected area QID prn   1 Tube   3   . esomeprazole (NEXIUM) 40 MG capsule   Oral   Take 1 capsule (40 mg total) by mouth daily.   90 capsule   1   . fluticasone (FLONASE) 50 MCG/ACT nasal spray   Nasal   Place 2 sprays into the nose daily. For sinuses/allergies   16 g   3   . folic acid  (FOLVITE) 400 MCG tablet   Oral   Take 400 mcg by mouth daily.         Marland Kitchen ibuprofen (ADVIL,MOTRIN) 800 MG tablet   Oral   Take 1 tablet (800 mg total) by mouth every 8 (eight) hours as needed for pain.   45 tablet   2   . levothyroxine (SYNTHROID, LEVOTHROID) 88 MCG tablet   Oral   Take 1 tablet (88 mcg total) by mouth daily.   90 tablet   1   . Linaclotide 290 MCG CAPS   Oral   Take 1 capsule by mouth daily.   30 capsule   6   . loratadine (CLARITIN) 10 MG tablet   Oral   Take 1 tablet (10 mg total) by mouth daily.   30 tablet   2   . LORazepam (ATIVAN) 0.5 MG tablet   Oral   Take 1 tablet (0.5 mg total) by mouth every 8 (eight) hours as needed for anxiety.   60 tablet   1   . medroxyPROGESTERone (PROVERA) 10 MG tablet   Oral   Take 1 tablet (10 mg total) by mouth daily. Take one tablet daily by mouth for 10 consecutive days. Repeat this every 3 months for one year   10 tablet   3   . metFORMIN (GLUCOPHAGE) 1000 MG tablet   Oral   Take 1 tablet (1,000 mg total) by mouth 2 (two) times daily with a meal.   180 tablet   1   . promethazine (PHENERGAN) 12.5 MG tablet      TAKE ONE TABLET BY MOUTH EVERY 6 HOURS AS NEEDED FOR NAUSEA   30 tablet   0   . sodium polystyrene (KAYEXALATE) 15 GM/60ML suspension   Oral   Take 120 mLs (30 g total) by mouth once.   120 mL   0   . zolpidem (AMBIEN) 10 MG tablet   Oral   Take 1 tablet (10 mg total) by mouth at bedtime as needed.   30 tablet   2    BP 129/77  Pulse 87  Temp(Src) 97.6 F (36.4 C) (Oral)  Resp 16  Ht 5\' 4"  (1.626 m)  Wt 152 lb (68.947 kg)  BMI 26.08 kg/m2  SpO2 99% Physical Exam  Nursing note and vitals reviewed. Constitutional: She appears well-developed and well-nourished.  HENT:  Head:  Normocephalic and atraumatic.  Eyes: Conjunctivae are normal. Pupils are equal, round, and reactive to light.  Neck: Neck supple. No tracheal deviation present. No thyromegaly present.  Cardiovascular:  Normal rate and regular rhythm.   No murmur heard. Pulmonary/Chest: Effort normal and breath sounds normal.  Abdominal: Soft. Bowel sounds are normal. She exhibits no distension and no mass. There is tenderness. There is no rebound and no guarding.  Obese tender at right upper quadrant  Musculoskeletal: Normal range of motion. She exhibits no edema and no tenderness.  Neurological: She is alert. Coordination normal.  Skin: Skin is warm and dry. No rash noted.  Psychiatric: She has a normal mood and affect.    ED Course  Procedures (including critical care time) Labs Reviewed  COMPREHENSIVE METABOLIC PANEL - Abnormal; Notable for the following:    Glucose, Bld 140 (*)    Creatinine, Ser 1.36 (*)    GFR calc non Af Amer 46 (*)    GFR calc Af Amer 53 (*)    All other components within normal limits  CBC  URINALYSIS, ROUTINE W REFLEX MICROSCOPIC   No results found. No diagnosis found. Results for orders placed during the hospital encounter of 09/24/12  CBC      Result Value Range   WBC 9.4  4.0 - 10.5 K/uL   RBC 4.20  3.87 - 5.11 MIL/uL   Hemoglobin 13.5  12.0 - 15.0 g/dL   HCT 16.1  09.6 - 04.5 %   MCV 97.1  78.0 - 100.0 fL   MCH 32.1  26.0 - 34.0 pg   MCHC 33.1  30.0 - 36.0 g/dL   RDW 40.9  81.1 - 91.4 %   Platelets 357  150 - 400 K/uL  COMPREHENSIVE METABOLIC PANEL      Result Value Range   Sodium 138  135 - 145 mEq/L   Potassium 4.7  3.5 - 5.1 mEq/L   Chloride 98  96 - 112 mEq/L   CO2 28  19 - 32 mEq/L   Glucose, Bld 140 (*) 70 - 99 mg/dL   BUN 19  6 - 23 mg/dL   Creatinine, Ser 7.82 (*) 0.50 - 1.10 mg/dL   Calcium 95.6  8.4 - 21.3 mg/dL   Total Protein 8.3  6.0 - 8.3 g/dL   Albumin 4.1  3.5 - 5.2 g/dL   AST 33  0 - 37 U/L   ALT 19  0 - 35 U/L   Alkaline Phosphatase 75  39 - 117 U/L   Total Bilirubin 0.3  0.3 - 1.2 mg/dL   GFR calc non Af Amer 46 (*) >90 mL/min   GFR calc Af Amer 53 (*) >90 mL/min  URINALYSIS, ROUTINE W REFLEX MICROSCOPIC      Result Value  Range   Color, Urine YELLOW  YELLOW   APPearance CLEAR  CLEAR   Specific Gravity, Urine 1.010  1.005 - 1.030   pH 7.5  5.0 - 8.0   Glucose, UA NEGATIVE  NEGATIVE mg/dL   Hgb urine dipstick NEGATIVE  NEGATIVE   Bilirubin Urine NEGATIVE  NEGATIVE   Ketones, ur NEGATIVE  NEGATIVE mg/dL   Protein, ur NEGATIVE  NEGATIVE mg/dL   Urobilinogen, UA 1.0  0.0 - 1.0 mg/dL   Nitrite NEGATIVE  NEGATIVE   Leukocytes, UA NEGATIVE  NEGATIVE  LIPASE, BLOOD      Result Value Range   Lipase 36  11 - 59 U/L   Ct Chest Wo Contrast  09/18/2012   *  RADIOLOGY REPORT*  Clinical Data: Follow-up pulmonary nodules.  CT CHEST WITHOUT CONTRAST  Technique:  Multidetector CT imaging of the chest was performed following the standard protocol without IV contrast.  Comparison: 09/07/2011  Findings: 6 mm right lower lobe pulmonary nodule on image 30 is stable.  The 6 mm anterior pleural-based right middle lobe nodule on image 34 is stable.  Additional small nodules in the right lung are stable.  There is an area of emphysema and fibrotic changes in the right upper lobe on image 25 which is stable.  Left upper lobe nodule on image 20 is stable.  Mediastinum is stable.  Thoracic inlet is stable with postoperative changes from thyroidectomy.  IMPRESSION: Stable small pulmonary nodules dating back to 12/29/2010. Stability over nearly 2 years supports benign etiology.   Original Report Authenticated By: Jolaine Click, M.D.   Dx#1 abdominal pain #2 arthralgias #3 hyperglycemia Patient requested Toradol. I declined , in light of her diabetes and renal insufficiency. I offered opioids, Vicodin.  MDM  I agree the patient requires an abdominal ultrasound as part of her workup for abdominal pain. I told the patient that in light of her n.p.o. status being only approximately 3 hours ago an ultrasound would be not worthwhile for several more hours. Patient became angry and left the emergency department before I could be notified by the  nurse. I feel that shoulder pain and knee pain  musculoskeletal in etiology  Doug Sou, MD 09/24/12 1610

## 2012-09-24 NOTE — Telephone Encounter (Signed)
Pt aware.

## 2012-09-24 NOTE — Telephone Encounter (Signed)
I called and left pt a message, her potassium is very high at 6.5, kayexalate sent in, she also needs to come back tomorrow for STAT BMET to recheck this Increase water intake, Hold her Metformin and  benezapril until further notice due to her kidney function

## 2012-09-24 NOTE — Telephone Encounter (Signed)
Pt is aware but wants to know if you can go and injection on shoulder tomorrow when she comes in to get her bloodwork? Also, she is wanting to get a work note for work until [employer] account [employer].

## 2012-09-24 NOTE — ED Notes (Addendum)
Pt presents to nursing desk asking if she needed to sign any paperwork, explained to pt that she ws not up for discharge, pt states that she is leaving and does not want any treatment, explained to pt the risk of refusing treatment, pt states "I ain't staying here" kept repeating "I am not a drug addict" asked pt several times about staying and obtaining medical treatment, pt refused each time. ama form explained to pt and signed by pt as well.

## 2012-09-24 NOTE — Telephone Encounter (Signed)
Pt called today at 345pm stating she isnt feeling well, stomach still hurts, shoulder is hurting feels like going to vomit and back hurts..told me she had taken her meds like Dr. Jeanice Lim asked today and stopped the Metformin as told. She states she is hurting so bad adn just wants to go to the ED. I talked to her and told her if she is feeling that bad then she needed to go.

## 2012-09-24 NOTE — ED Notes (Signed)
Right shoulder pain since waking up Saturday, hurts with movement. Left knee pain since Saturday also. Generalized abdominal pain with nausea.Seen by PMD yesterday and was given phenergan and called in med for hyperkalemia. Pt states she isn't feeling any better. NAD

## 2012-09-25 ENCOUNTER — Ambulatory Visit (HOSPITAL_COMMUNITY): Payer: BC Managed Care – PPO

## 2012-09-25 ENCOUNTER — Other Ambulatory Visit (INDEPENDENT_AMBULATORY_CARE_PROVIDER_SITE_OTHER): Payer: Self-pay

## 2012-09-25 ENCOUNTER — Telehealth: Payer: Self-pay | Admitting: Family Medicine

## 2012-09-25 DIAGNOSIS — E039 Hypothyroidism, unspecified: Secondary | ICD-10-CM

## 2012-09-25 LAB — T4: T4, Total: 9.8 ug/dL (ref 5.0–12.5)

## 2012-09-25 LAB — T3, FREE: T3, Free: 2.1 pg/mL — ABNORMAL LOW (ref 2.3–4.2)

## 2012-09-25 MED ORDER — HYDROCODONE-ACETAMINOPHEN 5-325 MG PO TABS: 1.0000 | ORAL_TABLET | Freq: Four times a day (QID) | ORAL | Status: DC | PRN

## 2012-09-25 NOTE — Telephone Encounter (Signed)
Pt wants to know when can she take her Metformin and benezepril again. Also takes she needs something for her shoulder pain she is not getting any better.

## 2012-09-25 NOTE — Telephone Encounter (Signed)
Discussed with pt, seen in ED for abd pain and shoulder pain, left because she was labeled a "drug seeker" and she was very upset. Abd pain has resolved but still has pain into right shoulder blade, as this could be associated with gallbladder etiology, will have ultrasound in AM Given norco for shoulder pain, if no improvement or cause found refer to ortho K down to normal, Cr back at baseline, okay to restart meds

## 2012-09-26 ENCOUNTER — Ambulatory Visit (HOSPITAL_COMMUNITY): Payer: BC Managed Care – PPO | Attending: Physician Assistant

## 2012-10-01 ENCOUNTER — Ambulatory Visit (HOSPITAL_COMMUNITY)
Admission: RE | Admit: 2012-10-01 | Discharge: 2012-10-01 | Disposition: A | Discharge status: Home / Self Care | Payer: BC Managed Care – PPO | Source: Ambulatory Visit | Attending: Physician Assistant | Admitting: Physician Assistant

## 2012-10-01 ENCOUNTER — Ambulatory Visit: Payer: BC Managed Care – PPO | Admitting: Family Medicine

## 2012-10-01 DIAGNOSIS — R110 Nausea: Secondary | ICD-10-CM

## 2012-10-01 DIAGNOSIS — R1011 Right upper quadrant pain: Secondary | ICD-10-CM | POA: Insufficient documentation

## 2012-10-02 ENCOUNTER — Ambulatory Visit (HOSPITAL_COMMUNITY)
Admission: RE | Admit: 2012-10-02 | Discharge: 2012-10-02 | Disposition: A | Discharge status: Home / Self Care | Payer: BC Managed Care – PPO | Source: Ambulatory Visit | Attending: Family Medicine | Admitting: Family Medicine

## 2012-10-02 ENCOUNTER — Encounter: Payer: Self-pay | Admitting: Family Medicine

## 2012-10-02 ENCOUNTER — Ambulatory Visit (INDEPENDENT_AMBULATORY_CARE_PROVIDER_SITE_OTHER): Payer: BC Managed Care – PPO | Admitting: Family Medicine

## 2012-10-02 VITALS — BP 100/68 | HR 80 | Temp 98.0°F | Resp 16 | Ht 64.0 in | Wt 151.0 lb

## 2012-10-02 DIAGNOSIS — M25519 Pain in unspecified shoulder: Secondary | ICD-10-CM | POA: Insufficient documentation

## 2012-10-02 DIAGNOSIS — M25569 Pain in unspecified knee: Secondary | ICD-10-CM | POA: Insufficient documentation

## 2012-10-02 DIAGNOSIS — G629 Polyneuropathy, unspecified: Secondary | ICD-10-CM | POA: Insufficient documentation

## 2012-10-02 DIAGNOSIS — M542 Cervicalgia: Secondary | ICD-10-CM | POA: Insufficient documentation

## 2012-10-02 DIAGNOSIS — G609 Hereditary and idiopathic neuropathy, unspecified: Secondary | ICD-10-CM

## 2012-10-02 DIAGNOSIS — S139XXA Sprain of joints and ligaments of unspecified parts of neck, initial encounter: Secondary | ICD-10-CM

## 2012-10-02 DIAGNOSIS — N183 Chronic kidney disease, stage 3 unspecified: Secondary | ICD-10-CM

## 2012-10-02 DIAGNOSIS — M25562 Pain in left knee: Secondary | ICD-10-CM

## 2012-10-02 DIAGNOSIS — M25511 Pain in right shoulder: Secondary | ICD-10-CM

## 2012-10-02 DIAGNOSIS — S161XXA Strain of muscle, fascia and tendon at neck level, initial encounter: Secondary | ICD-10-CM | POA: Insufficient documentation

## 2012-10-02 DIAGNOSIS — N289 Disorder of kidney and ureter, unspecified: Secondary | ICD-10-CM

## 2012-10-02 MED ORDER — CYCLOBENZAPRINE HCL 5 MG PO TABS: 5.0000 mg | ORAL_TABLET | Freq: Every evening | ORAL | Status: DC | PRN

## 2012-10-02 NOTE — Assessment & Plan Note (Signed)
Xray neg, I think pain is more from neck

## 2012-10-02 NOTE — Patient Instructions (Signed)
Referral to neurology in [place] for the neuropathy Records release from Dr. Gerilyn Pilgrim [facility] neurology  Referral to Orthopedics - Dr. Milus Height at bedtime, do not take with ambien Continue biofreeze to knee Get xrays done  F/U as previous

## 2012-10-02 NOTE — Assessment & Plan Note (Signed)
Xray negative, straightening due to spasm, flexeril at bedtime

## 2012-10-02 NOTE — Assessment & Plan Note (Signed)
Xray of knee normal, no effusion, initially was to send to ortho, but with no signs of disease and normal exam will defer and have her see neurology instead

## 2012-10-02 NOTE — Assessment & Plan Note (Signed)
Creatinine back to baseline.

## 2012-10-02 NOTE — Assessment & Plan Note (Signed)
Refer to neurology for evaluation, I have no record of past conduction studies Has tried multiple medications with no improvement, but symptoms worsening

## 2012-10-02 NOTE — Progress Notes (Signed)
  Subjective:    Patient ID: Kathryn Oconnell, female    DOB: 1965/12/04, 47 y.o.   MRN: [id number]  HPI Patient here with recurrent right shoulder pain. She was evaluated about 2 weeks ago however that time had improved. She'll look from her sleep with right-sided neck pain and shoulder pain. She denies any tingling numbness in her hands.  Left knee pain she's had left knee pain for the past few weeks. She denies any injury. [place] which did not [place] she then tried bio freeze which helps some. She denies any swelling of the knee.  Neuropathy has a long-standing history of neuropathy she saw neurology many years ago she had nerve conduction done which showed severe neuropathy. She was tried on Lyrica gabapentin and appears a TCA with no improvement. She was also sent to a pain management where a topical cream was prescribed but this also did not help. She feels more burning and numbness in her feet which travel up her leg sometimes she feels like her legs have Ace bandages wrapped around them   Review of Systems - per above  GEN- denies fatigue, fever, weight loss,weakness, recent illness HEENT- denies eye drainage, change in vision, nasal discharge, CVS- denies chest pain, palpitations RESP- denies SOB, cough, wheeze MSK- + joint pain, muscle aches, injury Neuro- denies headache, dizziness, syncope, seizure activity       Objective:   Physical Exam GEN- NAD, alert and oriented x3 HEENT- PERRL, EOMI, non injected sclera, pink conjunctiva, MMM, oropharynx clear Neck- Supple, mild TTP right post neck, +spasm, good ROM CVS- RRR, no murmur RESP-CTAB MSK- Normal inspection UE, Rotator cuff in tact, biceps in tact, Spine NT , normal ROm shoulder joint bilat, left knee normal ROM, normal inspection Neuro- CNII-XII in tact, normal tone upper and lower ext, decreased monofilament feet bialt  EXT- No edema, normal skin warmth Pulses- Radial, DP- 2+        Assessment &  Plan:

## 2012-10-10 ENCOUNTER — Other Ambulatory Visit: Payer: Self-pay

## 2012-10-17 ENCOUNTER — Telehealth: Payer: Self-pay | Admitting: Family Medicine

## 2012-10-17 NOTE — Telephone Encounter (Signed)
?  ok to refill °

## 2012-10-18 ENCOUNTER — Ambulatory Visit (HOSPITAL_COMMUNITY): Payer: Self-pay | Admitting: Psychiatry

## 2012-10-18 MED ORDER — LORAZEPAM 0.5 MG PO TABS: 0.5000 mg | ORAL_TABLET | Freq: Three times a day (TID) | ORAL | Status: DC | PRN

## 2012-10-18 MED ORDER — ZOLPIDEM TARTRATE 10 MG PO TABS: 10.0000 mg | ORAL_TABLET | Freq: Every evening | ORAL | Status: DC | PRN

## 2012-10-18 NOTE — Telephone Encounter (Signed)
Meds refilled.

## 2012-10-18 NOTE — Telephone Encounter (Signed)
Okay to refill? 

## 2012-10-21 ENCOUNTER — Other Ambulatory Visit: Payer: Self-pay | Admitting: Neurology

## 2012-10-21 ENCOUNTER — Encounter: Payer: Self-pay | Admitting: Neurology

## 2012-10-21 ENCOUNTER — Ambulatory Visit (INDEPENDENT_AMBULATORY_CARE_PROVIDER_SITE_OTHER): Payer: BC Managed Care – PPO | Admitting: Neurology

## 2012-10-21 VITALS — BP 103/72 | HR 88 | Temp 97.1°F | Ht 65.0 in | Wt 148.0 lb

## 2012-10-21 DIAGNOSIS — G609 Hereditary and idiopathic neuropathy, unspecified: Secondary | ICD-10-CM

## 2012-10-21 DIAGNOSIS — G2581 Restless legs syndrome: Secondary | ICD-10-CM

## 2012-10-21 MED ORDER — ROPINIROLE HCL 0.25 MG PO TABS: ORAL_TABLET | ORAL | Status: DC

## 2012-10-21 NOTE — Progress Notes (Signed)
Subjective:    Patient ID: Kathryn Oconnell is a 47 y.o. female.  HPI  Huston Foley, MD, PhD [facility] [address] [address] [place], [place] [address]  Dear Dr. Jeanice Lim,   I saw your patient, Kathryn Oconnell, upon your kind request in my neurologic clinic today for initial consultation of her peripheral neuropathy. The patient is accompanied by her 31 yo son, Alycia Rossetti, today. As you know, Kathryn Oconnell is a very pleasant 47 year old right-handed woman with an underlying medical history of DM, HTN, HLP, hypothyroidism, COPD, panic d/o, who was previously diagnosed with peripheral neuropathy several years ago and used to see a neurologist. She has tried Lyrica, gabapentin, and try cyclic anti-depressants with no improvement. She was sent to pain management and was given a topical cream with no improvement. She has had worsening pain, numbness and tingling in her feet and legs in the past several months. She describes and burning sensation and stiffness in both feet in the toes and the soles of her feet. She shaw Dr. Gerilyn Pilgrim in [name] who did a NCV test in or around 2008, which was c/w neuropathy per pt. Her Sx are worse at night and she also endorses some restlessness and difficulty getting a comfortable position, and moves her legs a lot. In the last 3 months, she has been noticing a band-like sensation around her L leg from below knee to lower shin, which feels like an Ace wrap without pain and without tingling. She was treated with Cymbalta as well. She has not been on Requip or Mirapex. She has been noted to jerk in her sleep per son. She takes Ambien for sleep and snores, but no apneas are reported.   Her Past Medical History Is Significant For: Past Medical History  Diagnosis Date  . Hypertension   . Diabetes mellitus without complication   . Hyperlipidemia   . Hypothyroid   . Tobacco use   . Lung nodule   . COPD (chronic obstructive pulmonary disease)   .  HSV-2 infection   . Depression   . Anxiety   . Graves disease   . Neuropathy     Her Past Surgical History Is Significant For: Past Surgical History  Procedure Laterality Date  . Cesarean section    . Thyroid surgery    . Foot surgery      5th digit left foot / Great toe, 2ND TOE    Her Family History Is Significant For: Family History  Problem Relation Age of Onset  . Stroke Mother   . Heart disease Mother   . Cancer Mother     breast   . Thyroid disease Mother   . Hyperlipidemia Mother   . Heart disease Father   . Cancer Father     stomach cancer   . Hypertension Father   . Heart attack Father   . Gout Brother     Her Social History Is Significant For: History   Social History  . Marital Status: Married    Spouse Name: N/A    Number of Children: N/A  . Years of Education: N/A   Social History Main Topics  . Smoking status: Current Every Day Smoker -- 0.50 packs/day    Types: Cigarettes  . Smokeless tobacco: Never Used  . Alcohol Use: No  . Drug Use: No  . Sexually Active: Yes    Birth Control/ Protection: Surgical   Other Topics Concern  . None   Social History Narrative  .  None    Her Allergies Are:  Allergies  Allergen Reactions  . Erythromycin   . Imitrex (Sumatriptan)   . Sulfa Antibiotics   . Topamax (Topiramate)     Nausea and worsening  :   Her Current Medications Are:  Outpatient Encounter Prescriptions as of 10/21/2012  Medication Sig Dispense Refill  . aspirin 81 MG tablet Take 81 mg by mouth daily.      Marland Kitchen atorvastatin (LIPITOR) 20 MG tablet Take 1 tablet (20 mg total) by mouth daily.  90 tablet  1  . benazepril (LOTENSIN) 10 MG tablet Take 1 tablet (10 mg total) by mouth daily.  90 tablet  1  . butalbital-acetaminophen-caffeine (FIORICET) 50-325-40 MG per tablet Take 1-2 tablets by mouth every 6 (six) hours as needed for headache.  20 tablet  1  . cyanocobalamin 500 MCG tablet Take 500 mcg by mouth daily.      . cyclobenzaprine  (FLEXERIL) 5 MG tablet Take 1 tablet (5 mg total) by mouth at bedtime as needed for muscle spasms.  30 tablet  1  . diclofenac sodium (VOLTAREN) 1 % GEL Apply to affected area QID prn  1 Tube  3  . esomeprazole (NEXIUM) 40 MG capsule Take 1 capsule (40 mg total) by mouth daily.  90 capsule  1  . fluticasone (FLONASE) 50 MCG/ACT nasal spray Place 2 sprays into the nose daily. For sinuses/allergies  16 g  3  . folic acid (FOLVITE) 400 MCG tablet Take 400 mcg by mouth daily.      Marland Kitchen ibuprofen (ADVIL,MOTRIN) 800 MG tablet Take 1 tablet (800 mg total) by mouth every 8 (eight) hours as needed for pain.  45 tablet  2  . levothyroxine (SYNTHROID, LEVOTHROID) 88 MCG tablet Take 1 tablet (88 mcg total) by mouth daily.  90 tablet  1  . Linaclotide 290 MCG CAPS Take 1 capsule by mouth daily.  30 capsule  6  . loratadine (CLARITIN) 10 MG tablet Take 1 tablet (10 mg total) by mouth daily.  30 tablet  2  . LORazepam (ATIVAN) 0.5 MG tablet Take 1 tablet (0.5 mg total) by mouth every 8 (eight) hours as needed for anxiety.  60 tablet  0  . medroxyPROGESTERone (PROVERA) 10 MG tablet Take 1 tablet (10 mg total) by mouth daily. Take one tablet daily by mouth for 10 consecutive days. Repeat this every 3 months for one year  10 tablet  3  . metFORMIN (GLUCOPHAGE) 1000 MG tablet Take 1 tablet (1,000 mg total) by mouth 2 (two) times daily with a meal.  180 tablet  1  . promethazine (PHENERGAN) 12.5 MG tablet TAKE ONE TABLET BY MOUTH EVERY 6 HOURS AS NEEDED FOR NAUSEA  30 tablet  0  . zolpidem (AMBIEN) 10 MG tablet Take 1 tablet (10 mg total) by mouth at bedtime as needed.  30 tablet  0  . [DISCONTINUED] HYDROcodone-acetaminophen (NORCO) 5-325 MG per tablet Take 1 tablet by mouth every 6 (six) hours as needed for pain.  30 tablet  0   No facility-administered encounter medications on file as of 10/21/2012.  : Review of Systems  Neurological: Positive for numbness.  Psychiatric/Behavioral: The patient is nervous/anxious.      Objective:  Neurologic Exam  Physical Exam Physical Examination:   Filed Vitals:   10/21/12 1413  BP: 103/72  Pulse: 88  Temp: 97.1 F (36.2 C)    General Examination: The patient is a very pleasant 47 y.o. female in no acute  distress. She appears well-developed and well-nourished and well groomed.   HEENT: Normocephalic, atraumatic, pupils are equal, round and reactive to light and accommodation. Funduscopic exam is normal with sharp disc margins noted. Extraocular tracking is good without limitation to gaze excursion or nystagmus noted. Normal smooth pursuit is noted. Hearing is grossly intact. Tympanic membranes are clear bilaterally. Face is symmetric with normal facial animation and normal facial sensation. Speech is clear with no dysarthria noted. There is no hypophonia. There is no lip, neck/head, jaw or voice tremor. Neck is supple with full range of passive and active motion. There are no carotid bruits on auscultation. Oropharynx exam reveals: mild mouth dryness, adequate dental hygiene and mild airway crowding, due to larger tongue and tonsillar size of 1+.    Chest: Clear to auscultation without wheezing, rhonchi or crackles noted.  Heart: S1+S2+0, regular and normal without murmurs, rubs or gallops noted.   Abdomen: Soft, non-tender and non-distended with normal bowel sounds appreciated on auscultation.  Extremities: There is no pitting edema in the distal lower extremities bilaterally. Pedal pulses are intact.  Skin: Warm and dry without trophic changes noted. There are no varicose veins.  Musculoskeletal: exam reveals no obvious joint deformities, tenderness or joint swelling or erythema.   Neurologically:  Mental status: The patient is awake, alert and oriented in all 4 spheres. Her memory, attention, language and knowledge are appropriate. There is no aphasia, agnosia, apraxia or anomia. Speech is clear with normal prosody and enunciation. Thought process is  linear. Mood is congruent and affect is normal.  Cranial nerves are as described above under HEENT exam. In addition, shoulder shrug is normal with equal shoulder height noted. Motor exam: Normal bulk, strength and tone is noted. There is no drift, tremor or rebound. Romberg is negative. Reflexes are 2+ throughout. Toes are downgoing bilaterally. Fine motor skills are intact with normal finger taps, normal hand movements, normal rapid alternating patting, normal foot taps and normal foot agility.  Cerebellar testing shows no dysmetria or intention tremor on finger to nose testing. Heel to shin is unremarkable bilaterally. There is no truncal or gait ataxia.  Sensory exam is intact to light touch, pinprick, vibration, temperature sense and proprioception in the upper extremities, but reduced to PP, vibration and temperature sense in bilateral LE up to the ankles. Normal proprioception in the toes are noted.  Gait, station and balance are unremarkable. No veering to one side is noted. No leaning to one side is noted. Posture is age-appropriate and stance is narrow based. No problems turning are noted. She turns en bloc. Tandem walk is unremarkable. Intact toe and heel stance is noted.               Assessment and Plan:   Assessment and Plan:  In summary, KADIJA CRUZEN is a very pleasant 47 y.o.-year old female with a history of DM, HTN, HLP and hypothyroidism with a History and physical exam c/w neuropathy, likely diabetic, but also some historical features of RLS. Otherwise, her physical exam is non-focal and I reassured the patient in that regard.  I had a long chat with the patient about my findings and the diagnosis of PN and RLS, the prognosis and treatment options. We talked about medical treatments and non-pharmacological approaches. We talked about smoking cessation and maintaining a healthy lifestyle in general. I encouraged the patient to eat healthy, exercise daily and keep well hydrated, to  keep a scheduled bedtime and wake time routine, to not skip  any meals and eat healthy snacks in between meals and to have protein with every meal.   As far as further diagnostic testing is concerned, I suggested the following today: blood work, EMG/NCV.  As far as medications are concerned, I recommended the following at this time: trial of Requip. We will also consider Horizant down the road. She has tried multiple medications in the past and realizes, our options may be limited.  I answered all her questions today and the patient was in agreement with the above outlined plan. I would like to see the patient back in 3 months, sooner if the need arises and encouraged her to call with any interim questions, concerns, problems or updates and refill requests and test results.   Thank you very much for allowing me to participate in the care of this nice patient. If I can be of any further assistance to you please do not hesitate to call me at [phone].  Sincerely,   Huston Foley, MD, PhD

## 2012-10-21 NOTE — Patient Instructions (Addendum)
I think overall you are doing fairly well and are stable at this point.   I do have some generic suggestions for you today:  Please make sure that you drink plenty of fluids. I would like for you to exercise daily for example in the form of walking 20-30 minutes every day, if you can. Please keep a regular sleep-wake schedule, keep regular meal times, do not skip any meals, eat  healthy snacks in between meals, such as fruit or nuts. Try to eat protein with every meal.   As far as your medications are concerned, I would like to suggest: requip 0.25 mg take one pill each night for one week, then 2 pills each night for one week, then 3 pills each night thereafter. Take about 90 minutes before your bedtime, around 9:30 PM. Most common side effects or sleepiness oversedation, nausea, rare vomiting.   As far as diagnostic testing, I recommend: EMG, NCV, blood work.   Engage in social activities in your community and with your family and try to keep up with current events by reading the newspaper or watching the news.  I will see you back in 3 months, sooner if we need to. Please call us if you have any interim questions, concerns, or problems or updates to need to discuss.  Please also call us for any test results so we can go over those with you on the phone. Brett Canales is my clinical assistant and will answer any of your questions and relay your messages to me and will give you my messages.   Our phone number is [phone]. We also have an after hours call service for urgent matters and there is a physician on-call for urgent questions. For any emergencies you know to call 911 or go to the nearest emergency room.

## 2012-10-22 ENCOUNTER — Telehealth: Payer: Self-pay | Admitting: Family Medicine

## 2012-10-22 NOTE — Telephone Encounter (Signed)
Okay to refill, give 2 extra 

## 2012-10-23 ENCOUNTER — Telehealth: Payer: Self-pay | Admitting: Neurology

## 2012-10-23 MED ORDER — ZOLPIDEM TARTRATE 10 MG PO TABS: 10.0000 mg | ORAL_TABLET | Freq: Every evening | ORAL | Status: DC | PRN

## 2012-10-23 MED ORDER — LORAZEPAM 0.5 MG PO TABS: 0.5000 mg | ORAL_TABLET | Freq: Three times a day (TID) | ORAL | Status: DC | PRN

## 2012-10-23 NOTE — Telephone Encounter (Signed)
Both Meds phoned in

## 2012-10-24 NOTE — Telephone Encounter (Signed)
Patient requesting lab results. Called, no answer.

## 2012-10-25 ENCOUNTER — Telehealth: Payer: Self-pay | Admitting: Neurology

## 2012-10-25 NOTE — Telephone Encounter (Signed)
Spoke to pt and gave her results of labs.  Explained as per above.  Copper was pending.  Will call her with these when available.

## 2012-10-25 NOTE — Telephone Encounter (Signed)
I called [facility] and and they have results except for copper.  I called and LMVM for pt home # that have them except copper.  Need Dr. Frances Furbish to review and call her back once done.

## 2012-10-25 NOTE — Telephone Encounter (Signed)
Message copied by Hermenia Fiscal on Fri Oct 25, 2012 10:56 AM ------      Message from: Seth Bake      Created: Thu Oct 24, 2012  4:07 PM       Anxious to hear from her lab results.  Wants a call this afternoon.  [phone] ------

## 2012-10-25 NOTE — Telephone Encounter (Signed)
Please call patient and advise her that her labs for the most part look normal. Her B12 and folic acid were high which means that she may be taking a B complex her B. vitamins supplements. Her vitamin D was low at 15 and the lower cut off is usually around 30. She should consider taking an over-the-counter vitamin D supplement. Also the creatinine, which is a marker for kidney function was 1.17 which is borderline elevated indicating just the need to follow kidney function, especially with a Hx of HTN and DM. It is not an impaired function but is slightly increased value than the normal cut off. No other abnormality seen.

## 2012-10-27 LAB — COPPER, SERUM: Copper: 96 ug/dL (ref 72–166)

## 2012-10-27 LAB — COMPREHENSIVE METABOLIC PANEL
ALT: 15 IU/L (ref 0–32)
AST: 18 IU/L (ref 0–40)
Albumin/Globulin Ratio: 1.8 (ref 1.1–2.5)
Albumin: 4.6 g/dL (ref 3.5–5.5)
BUN/Creatinine Ratio: 18 (ref 9–23)
BUN: 21 mg/dL (ref 6–24)
CO2: 22 mmol/L (ref 18–29)
Calcium: 10.3 mg/dL — ABNORMAL HIGH (ref 8.7–10.2)
Creatinine, Ser: 1.17 mg/dL — ABNORMAL HIGH (ref 0.57–1.00)
GFR calc non Af Amer: 56 mL/min/{1.73_m2} — ABNORMAL LOW (ref >59)
Globulin, Total: 2.6 g/dL (ref 1.5–4.5)
Sodium: 140 mmol/L (ref 134–144)

## 2012-10-27 LAB — IFE AND PE, SERUM
Albumin/Glob SerPl: 1.3 (ref 0.7–2.0)
Alpha 1: 0.2 g/dL (ref 0.1–0.4)
Alpha2 Glob SerPl Elph-Mcnc: 0.8 g/dL (ref 0.4–1.2)
B-Globulin SerPl Elph-Mcnc: 1 g/dL (ref 0.6–1.3)
Gamma Glob SerPl Elph-Mcnc: 1.2 g/dL (ref 0.5–1.6)
IgA/Immunoglobulin A, Serum: 254 mg/dL (ref 91–414)
IgM (Immunoglobulin M), Srm: 44 mg/dL (ref 40–230)

## 2012-10-27 LAB — B12 AND FOLATE PANEL
Folate: >19.9 ng/mL (ref >3.0)
Vitamin B-12: >1999 pg/mL — ABNORMAL HIGH (ref 211–946)

## 2012-10-27 LAB — C-REACTIVE PROTEIN: CRP: 1 mg/L (ref 0.0–4.9)

## 2012-10-27 LAB — CK TOTAL AND CKMB (NOT AT ARMC)
CK-MB Index: 2.3 ng/mL (ref 0.0–2.9)
Total CK: 99 U/L (ref 24–173)

## 2012-10-27 LAB — VITAMIN D 25 HYDROXY (VIT D DEFICIENCY, FRACTURES): Vit D, 25-Hydroxy: 15.1 ng/mL — ABNORMAL LOW (ref 30.0–100.0)

## 2012-10-31 ENCOUNTER — Encounter: Payer: BC Managed Care – PPO | Admitting: Radiology

## 2012-10-31 ENCOUNTER — Encounter: Payer: BC Managed Care – PPO | Admitting: Neurology

## 2012-11-11 ENCOUNTER — Ambulatory Visit: Payer: BC Managed Care – PPO | Admitting: Neurology

## 2012-11-12 ENCOUNTER — Telehealth: Payer: Self-pay | Admitting: Family Medicine

## 2012-11-12 MED ORDER — ATORVASTATIN CALCIUM 20 MG PO TABS: 20.0000 mg | ORAL_TABLET | Freq: Every day | ORAL | Status: DC

## 2012-11-12 MED ORDER — ACYCLOVIR 200 MG PO CAPS: 200.0000 mg | ORAL_CAPSULE | Freq: Two times a day (BID) | ORAL | Status: DC

## 2012-11-12 NOTE — Telephone Encounter (Signed)
Med refilled.

## 2012-11-12 NOTE — Telephone Encounter (Signed)
Okay to refill meds 

## 2012-11-12 NOTE — Telephone Encounter (Signed)
Ok to refill Acyclovir? 

## 2012-11-12 NOTE — Telephone Encounter (Signed)
Acyclovir Lipitor 20 mg 1 QD #30

## 2012-11-18 ENCOUNTER — Telehealth: Payer: Self-pay | Admitting: Neurology

## 2012-11-18 DIAGNOSIS — G2581 Restless legs syndrome: Secondary | ICD-10-CM

## 2012-11-18 DIAGNOSIS — G609 Hereditary and idiopathic neuropathy, unspecified: Secondary | ICD-10-CM

## 2012-11-19 NOTE — Telephone Encounter (Signed)
Please confirm with the patient that she is taking Requip 0.25 mg strength 3 pills each night at this point. She can further increase this to 4 pills each night and weak have room to go up after that if need be. I would not like to discard this as an option at this moment as long as she can tolerate it and can go up on the dose. Please have her increase it to 4 pills each night for now and give [place] a call  after another 2-3 weeks to see how it's working.

## 2012-11-20 MED ORDER — GABAPENTIN ENACARBIL ER 300 MG PO TBCR: 300.0000 mg | EXTENDED_RELEASE_TABLET | Freq: Every day | ORAL | Status: DC

## 2012-11-20 NOTE — Telephone Encounter (Signed)
Informed patient per Dr  Frances Furbish to take 4 pills at night but patient stated that she does not think she has RLS, that's why its probably not working and that 3 pills at night keeps her up all night, so she does not think she be taking 4 pills,currently still taking 3 tablets at night(0.25mg ).

## 2012-11-20 NOTE — Telephone Encounter (Signed)
I do believe she has a component of restless leg syndrome. Nevertheless if they Requip was not working we can try her on Horizant 300 mg qHS. Please advise patient to taper off of Requip by dropping one pill every other night to a complete stop and then she can start the new medication which I will sent to her pharmacy. Most common side effects of Horizant include sedation, lethargy, confusion. Please advise patient. Also, please advise her that she should not take her Ambien when she starts the new medication as it can be quite sedating and we should be mindful of additive sedating effects of medications.

## 2012-11-20 NOTE — Telephone Encounter (Signed)
done

## 2012-11-29 ENCOUNTER — Encounter: Payer: BC Managed Care – PPO | Admitting: Radiology

## 2012-11-29 ENCOUNTER — Encounter: Payer: BC Managed Care – PPO | Admitting: Neurology

## 2012-12-09 ENCOUNTER — Telehealth: Payer: Self-pay | Admitting: Family Medicine

## 2012-12-09 MED ORDER — ZOLPIDEM TARTRATE 10 MG PO TABS: 10.0000 mg | ORAL_TABLET | Freq: Every evening | ORAL | Status: DC | PRN

## 2012-12-09 NOTE — Telephone Encounter (Signed)
Med phoned in °

## 2012-12-09 NOTE — Telephone Encounter (Signed)
Ambien 10 mg tab 1 QHS prn #30 last rf 10/18/12 °

## 2012-12-09 NOTE — Telephone Encounter (Signed)
Okay to refill? 

## 2012-12-09 NOTE — Telephone Encounter (Signed)
Ok to refill 

## 2012-12-13 ENCOUNTER — Telehealth: Payer: Self-pay | Admitting: Family Medicine

## 2012-12-13 ENCOUNTER — Ambulatory Visit (INDEPENDENT_AMBULATORY_CARE_PROVIDER_SITE_OTHER): Payer: BC Managed Care – PPO | Admitting: Family Medicine

## 2012-12-13 VITALS — BP 120/78 | HR 80 | Temp 97.8°F | Resp 20 | Wt 148.0 lb

## 2012-12-13 DIAGNOSIS — Z5189 Encounter for other specified aftercare: Secondary | ICD-10-CM

## 2012-12-13 DIAGNOSIS — G629 Polyneuropathy, unspecified: Secondary | ICD-10-CM

## 2012-12-13 DIAGNOSIS — M7918 Myalgia, other site: Secondary | ICD-10-CM

## 2012-12-13 DIAGNOSIS — R439 Unspecified disturbances of smell and taste: Secondary | ICD-10-CM

## 2012-12-13 DIAGNOSIS — G609 Hereditary and idiopathic neuropathy, unspecified: Secondary | ICD-10-CM

## 2012-12-13 DIAGNOSIS — E119 Type 2 diabetes mellitus without complications: Secondary | ICD-10-CM

## 2012-12-13 DIAGNOSIS — S20212D Contusion of left front wall of thorax, subsequent encounter: Secondary | ICD-10-CM

## 2012-12-13 DIAGNOSIS — IMO0001 Reserved for inherently not codable concepts without codable children: Secondary | ICD-10-CM

## 2012-12-13 MED ORDER — METFORMIN HCL 500 MG PO TABS: 500.0000 mg | ORAL_TABLET | Freq: Two times a day (BID) | ORAL | Status: DC

## 2012-12-13 MED ORDER — HYDROCODONE-ACETAMINOPHEN 5-325 MG PO TABS: 1.0000 | ORAL_TABLET | Freq: Four times a day (QID) | ORAL | Status: DC | PRN

## 2012-12-13 MED ORDER — VITAMIN D (ERGOCALCIFEROL) 1.25 MG (50000 UNIT) PO CAPS: 50000.0000 [IU] | ORAL_CAPSULE | ORAL | Status: DC

## 2012-12-13 NOTE — Telephone Encounter (Signed)
Ambien 10 mg tab 1 QHS prn #30 last rf 10/18/12

## 2012-12-13 NOTE — Telephone Encounter (Signed)
This was refilled on 12/09/12 with 2 refills...DENIED

## 2012-12-13 NOTE — Patient Instructions (Addendum)
Take 1/2 tablet of metformin twice a day Norco as needed for rib pain, I think this is deep bruising Stop Vit B12 and Folate Vitamin D 50,000 units 1 tablet a week for 12 weeks Hold the lipitor for 2 weeks and see if this changes anything with your taste or legs Flu shot given F/U 3 months

## 2012-12-13 NOTE — Progress Notes (Signed)
  Subjective:    Patient ID: Kathryn Oconnell, female    DOB: 1965-04-29, 47 y.o.   MRN: [id number]  HPI  Patient here to follow chronic medical problems. She's very frustrated that she has not had any improvement with her leg pain and neuropathy. She's been followed by neurology and was told she may have restless leg syndrome and she did not agree with this diagnosis. She was started on Requip which did not help therefore she called the office and has stopped this medication. She also did not followup for the nerve conduction study because she was very frustrated with neurology.  She also was seen by her dentist that she continues to have a metallic taste around one of her molars which he has metallic feeling and they told her it was due to her medications and that she should stop every single medication that she is on and he gave her a list of a different herbs to take instead.  Pt seen for workers comp after she fell playing basketball and bruised her ribs of left side, had negative Xray. Given TYlenol #3 but this did not help. Has pain with taking deep breaths  Review of Systems  GEN- denies fatigue, fever, weight loss,weakness, recent illness HEENT- denies eye drainage, change in vision, nasal discharge, CVS- denies chest pain, palpitations RESP- denies SOB, cough, wheeze ABD- denies N/V, change in stools, abd pain GU- denies dysuria, hematuria, dribbling, incontinence MSK- denies joint pain, +muscle aches,+ injury Neuro- denies headache, dizziness, syncope, seizure activity      Objective:   Physical Exam  GEN- NAD, alert and oriented x3 HEENT- PERRL, EOMI, non injected sclera, pink conjunctiva, MMM, oropharynx clear Neck- Supple, MSK- Left ribs, mild TTP lower anterior ribs, no bruising noted CVS- RRR, no murmur RESP-CTAB EXT- No edema Pulses- Radial, DP- 2+       Assessment & Plan:

## 2012-12-15 ENCOUNTER — Other Ambulatory Visit: Payer: Self-pay | Admitting: Physician Assistant

## 2012-12-15 ENCOUNTER — Encounter: Payer: Self-pay | Admitting: Family Medicine

## 2012-12-15 DIAGNOSIS — S20219A Contusion of unspecified front wall of thorax, initial encounter: Secondary | ICD-10-CM | POA: Insufficient documentation

## 2012-12-15 DIAGNOSIS — R439 Unspecified disturbances of smell and taste: Secondary | ICD-10-CM | POA: Insufficient documentation

## 2012-12-15 DIAGNOSIS — M7918 Myalgia, other site: Secondary | ICD-10-CM | POA: Insufficient documentation

## 2012-12-15 NOTE — Assessment & Plan Note (Signed)
She has muscle aches and neuropathy, reviewed neurology note. Will give her trial off her lipitor to see if this makes any difference in her symptoms Not trye spams or aches, more of tight feeling

## 2012-12-15 NOTE — Assessment & Plan Note (Signed)
A1C improved, decreased metformin to 500mg  BID

## 2012-12-15 NOTE — Assessment & Plan Note (Addendum)
Given short course of hydrocodone, ICE to rib area, f/u as needed

## 2012-12-15 NOTE — Assessment & Plan Note (Signed)
unfortunatey she continues to have a very specific taste, in only 1 portion of of mouth. I disagree with her dentist recommendations about stopping all of her medications and advised her on this. She has multiple comordities, HTN, CKD, DM. It is possible this is due to some of medications and we can give a trial off of one at a time to see what the cause is, and I explained to pt, but she declined doing this and I think that is reasonable.

## 2012-12-15 NOTE — Assessment & Plan Note (Addendum)
Advised to follow through with nerve conduction studies Leg pain and neuropathy does not appear to be radiating from lumbar region or any other joint

## 2012-12-27 ENCOUNTER — Telehealth: Payer: Self-pay | Admitting: Family Medicine

## 2012-12-27 MED ORDER — LORAZEPAM 0.5 MG PO TABS: 0.5000 mg | ORAL_TABLET | Freq: Three times a day (TID) | ORAL | Status: DC | PRN

## 2012-12-27 NOTE — Telephone Encounter (Signed)
Spoke with pt, off lipitor x 2 weeks, no change in legs, but abnormal tast

## 2012-12-27 NOTE — Telephone Encounter (Signed)
Abnormal taste did improve Will try her on crestor to see if this makes a difference She will f/u with there nerve conduction, if nothing is shown there will work up PAD

## 2012-12-31 ENCOUNTER — Telehealth: Payer: Self-pay | Admitting: Family Medicine

## 2012-12-31 NOTE — Telephone Encounter (Signed)
I called pt back regarding message below, left message for pt to return to my call

## 2012-12-31 NOTE — Telephone Encounter (Deleted)
Message copied by Samuella Cota on Tue Dec 31, 2012  5:15 PM ------      Message from: Eustaquio Maize      Created: Tue Dec 31, 2012  3:48 PM      Contact: [phone]       She did not like [facility] and she dies not want to go back.      She wants referral to Dr. Gerilyn Pilgrim.  Please call her. ------

## 2012-12-31 NOTE — Telephone Encounter (Deleted)
error 

## 2012-12-31 NOTE — Telephone Encounter (Signed)
Message copied by Samuella Cota on Tue Dec 31, 2012  5:13 PM ------      Message from: Eustaquio Maize      Created: Tue Dec 31, 2012  3:48 PM      Contact: [phone]       She did not like [facility] and she dies not want to go back.      She wants referral to Dr. Gerilyn Pilgrim.  Please call her. ------

## 2013-01-01 ENCOUNTER — Telehealth: Payer: Self-pay | Admitting: Family Medicine

## 2013-01-01 DIAGNOSIS — M791 Myalgia, unspecified site: Secondary | ICD-10-CM

## 2013-01-01 DIAGNOSIS — G629 Polyneuropathy, unspecified: Secondary | ICD-10-CM

## 2013-01-01 NOTE — Telephone Encounter (Signed)
Pt wants to go to Dr. Gerilyn Pilgrim. She is not happy with [facility]. She said she does not like the doctor and they are not good about calling her back when she calls to talk to someone. Can you refer her to Dr. Gerilyn Pilgrim?

## 2013-01-01 NOTE — Telephone Encounter (Signed)
New referral sent.

## 2013-01-02 ENCOUNTER — Telehealth: Payer: Self-pay | Admitting: Family Medicine

## 2013-01-02 NOTE — Telephone Encounter (Signed)
Called pt back and she had stated that she had spoken to Dr. Jeanice Lim about the above message

## 2013-01-02 NOTE — Telephone Encounter (Signed)
Message copied by Samuella Cota on Thu Jan 02, 2013  9:46 AM ------      Message from: Eustaquio Maize      Created: Tue Dec 31, 2012  3:48 PM      Contact: [phone]       She did not like [facility] and she dies not want to go back.      She wants referral to Dr. Gerilyn Pilgrim.  Please call her. ------

## 2013-01-14 ENCOUNTER — Telehealth: Payer: Self-pay | Admitting: Family Medicine

## 2013-01-14 MED ORDER — IBUPROFEN 800 MG PO TABS: 800.0000 mg | ORAL_TABLET | Freq: Three times a day (TID) | ORAL | Status: DC | PRN

## 2013-01-14 NOTE — Telephone Encounter (Signed)
Okay to refill? 

## 2013-01-14 NOTE — Telephone Encounter (Signed)
Motrin 800mg  Q8hr prn  #45  Last Rf 08/17/12 OK refill?

## 2013-01-21 ENCOUNTER — Other Ambulatory Visit: Payer: Self-pay | Admitting: Family Medicine

## 2013-01-21 DIAGNOSIS — Z139 Encounter for screening, unspecified: Secondary | ICD-10-CM

## 2013-01-24 ENCOUNTER — Other Ambulatory Visit: Payer: Self-pay | Admitting: Neurology

## 2013-01-24 DIAGNOSIS — R209 Unspecified disturbances of skin sensation: Secondary | ICD-10-CM

## 2013-01-30 ENCOUNTER — Ambulatory Visit: Payer: BC Managed Care – PPO | Admitting: Neurology

## 2013-02-06 ENCOUNTER — Other Ambulatory Visit: Payer: Self-pay

## 2013-02-14 ENCOUNTER — Other Ambulatory Visit (HOSPITAL_COMMUNITY): Payer: Self-pay

## 2013-02-14 ENCOUNTER — Other Ambulatory Visit: Payer: Self-pay | Admitting: Family Medicine

## 2013-02-14 MED ORDER — HYDROCODONE-ACETAMINOPHEN 5-325 MG PO TABS: 1.0000 | ORAL_TABLET | Freq: Four times a day (QID) | ORAL | Status: DC | PRN

## 2013-02-14 MED ORDER — ACYCLOVIR 200 MG PO CAPS: 200.0000 mg | ORAL_CAPSULE | Freq: Two times a day (BID) | ORAL | Status: DC

## 2013-02-14 NOTE — Telephone Encounter (Signed)
Med refilled, Rx printed out ready for doctor signature and pt to pick up

## 2013-02-18 ENCOUNTER — Ambulatory Visit (HOSPITAL_COMMUNITY): Payer: Self-pay

## 2013-02-18 ENCOUNTER — Ambulatory Visit (HOSPITAL_COMMUNITY)
Admission: RE | Admit: 2013-02-18 | Discharge: 2013-02-18 | Disposition: A | Discharge status: Home / Self Care | Payer: BC Managed Care – PPO | Source: Ambulatory Visit | Attending: Neurology | Admitting: Neurology

## 2013-02-18 ENCOUNTER — Ambulatory Visit (HOSPITAL_COMMUNITY)
Admission: RE | Admit: 2013-02-18 | Discharge: 2013-02-18 | Disposition: A | Discharge status: Home / Self Care | Payer: BC Managed Care – PPO | Source: Ambulatory Visit | Attending: Family Medicine | Admitting: Family Medicine

## 2013-02-18 DIAGNOSIS — Z1231 Encounter for screening mammogram for malignant neoplasm of breast: Secondary | ICD-10-CM | POA: Insufficient documentation

## 2013-02-18 DIAGNOSIS — Z139 Encounter for screening, unspecified: Secondary | ICD-10-CM

## 2013-02-18 DIAGNOSIS — G43909 Migraine, unspecified, not intractable, without status migrainosus: Secondary | ICD-10-CM | POA: Insufficient documentation

## 2013-02-18 DIAGNOSIS — R209 Unspecified disturbances of skin sensation: Secondary | ICD-10-CM

## 2013-02-21 ENCOUNTER — Telehealth: Payer: Self-pay | Admitting: Family Medicine

## 2013-02-21 NOTE — Telephone Encounter (Signed)
Called at [phone] no answer

## 2013-02-21 NOTE — Telephone Encounter (Signed)
Patient needs  something called in for headache . She saw Dr. Gerilyn Pilgrim  Monday and Tuesday and he Rx"ed Imitrex injection and she is allergic to this . He called this in at 3:00 pm and now they have closed for the week end. Please call her something in . She woke up this morning with this and is still there and the inside of her ear is very painful .   [employer] [phone]

## 2013-02-24 NOTE — Telephone Encounter (Signed)
Called pt back and she states that she has tried to call Dr. Dierdre Forth back no one has reponsed toher call yet. She states that her head is still hurting and ears, feels like it may be a sinus infection. Says she is allergic to imitrex and fiorocet is not working for her.. Can you call something in for her?

## 2013-02-24 NOTE — Telephone Encounter (Signed)
Pt is aware of message and has appt on Wednes at 12pm per dr. Jeanice Lim

## 2013-02-24 NOTE — Telephone Encounter (Signed)
Schedule appt for sinus infection She can use her hydrocodone for pain She can take mucinex for the sinuses

## 2013-02-26 ENCOUNTER — Ambulatory Visit: Payer: Self-pay | Admitting: Family Medicine

## 2013-03-05 ENCOUNTER — Telehealth: Payer: Self-pay | Admitting: *Deleted

## 2013-03-05 NOTE — Telephone Encounter (Signed)
Have her increase to Benazepril 20mg  and schedule visit in 1 week for blood pressure

## 2013-03-05 NOTE — Telephone Encounter (Signed)
Pt states she is still having headaches and happening all the time and that her BP is running high running 144/89,148/91 she is on benezepril 10mg  and feels like she they need to be adjusted. She went to Urgent Care in [place] and they had dx her with pinched nerve. What does she need to do now.

## 2013-03-05 NOTE — Telephone Encounter (Signed)
Pt called  back and aware of results.

## 2013-03-05 NOTE — Telephone Encounter (Signed)
Left message on pt vm to return my call

## 2013-03-07 ENCOUNTER — Telehealth: Payer: Self-pay | Admitting: *Deleted

## 2013-03-07 ENCOUNTER — Ambulatory Visit (INDEPENDENT_AMBULATORY_CARE_PROVIDER_SITE_OTHER): Payer: BC Managed Care – PPO | Admitting: Family Medicine

## 2013-03-07 VITALS — BP 120/80 | HR 100 | Temp 97.6°F | Resp 18 | Ht 63.5 in | Wt 156.0 lb

## 2013-03-07 DIAGNOSIS — G43909 Migraine, unspecified, not intractable, without status migrainosus: Secondary | ICD-10-CM

## 2013-03-07 DIAGNOSIS — I10 Essential (primary) hypertension: Secondary | ICD-10-CM

## 2013-03-07 MED ORDER — HYDROCODONE-ACETAMINOPHEN 10-325 MG PO TABS: 1.0000 | ORAL_TABLET | Freq: Three times a day (TID) | ORAL | Status: DC | PRN

## 2013-03-07 MED ORDER — PROPRANOLOL HCL ER 60 MG PO CP24: 60.0000 mg | ORAL_CAPSULE | Freq: Every day | ORAL | Status: DC

## 2013-03-07 NOTE — Telephone Encounter (Signed)
Schedule appt for today °

## 2013-03-07 NOTE — Telephone Encounter (Signed)
Pt called stating still has headaches she increased her benezapril to 20mg  BP is still running at 149/89 this am. Micah Flesher to urgent care [facility] and pressure was up to with headache gave phernagan and toradol. Wants to know what she needs to do now. Has appt on Friday next week.

## 2013-03-07 NOTE — Telephone Encounter (Signed)
Pt coming in today for rechek

## 2013-03-07 NOTE — Telephone Encounter (Signed)
Left message twice to return call, trying to fit her in todays schedule per dr. Jeanice Lim

## 2013-03-07 NOTE — Patient Instructions (Signed)
Start Inderal once a day Vicodin for pain COntinue lotensin 20mg  Change f/u appointment to End of January

## 2013-03-09 ENCOUNTER — Encounter: Payer: Self-pay | Admitting: Family Medicine

## 2013-03-09 DIAGNOSIS — I10 Essential (primary) hypertension: Secondary | ICD-10-CM | POA: Insufficient documentation

## 2013-03-09 NOTE — Assessment & Plan Note (Signed)
Will start her on propanolol once a day. She has followup with neurology and she will have a spinal tap. She has Phenergan at home. I've also given her an of hydrocodone to last her until her appointment next week with neurology

## 2013-03-09 NOTE — Assessment & Plan Note (Signed)
Increase Lotensin to 20 mg daily along with the propanolol which was added for her migraines

## 2013-03-09 NOTE — Progress Notes (Signed)
   Subjective:    Patient ID: Kathryn Oconnell, female    DOB: 1965/05/28, 47 y.o.   MRN: [id number]  HPI  Patient here for urgent visit secondary to migraines and blood pressure. She's history of migraine disorder. She's currently being worked up by neurology she did have an abnormal MRI which showed increased white matter therefore she is due to have lumbar puncture next week for workup for multiple sclerosis. She has been given multiple medications in the past for her migraines which have not helped. She was given fears that recently by myself as well as tramadol by her neurologist which has not helped. She actually went to the urgent care a few days ago secondary to migraine which was intractable she was given a shot of Phenergan and Toradol which stopped the migraine however it returned 48 hours later.  Her blood pressure has also been elevated her blood pressure has been in the upper 140s over 90s to 100. She's been taking Lotensin 20 mg for the past 2 days as directed.   Review of Systems  GEN- denies fatigue, fever, weight loss,weakness, recent illness HEENT- denies eye drainage, change in vision, nasal discharge, CVS- denies chest pain, palpitations RESP- denies SOB, cough, wheeze Neuro- +headache, denies dizziness, syncope, seizure activity      Objective:   Physical Exam  GEN- NAD, alert and oriented x3 HEENT- PERRL, EOMI, non injected sclera, pink conjunctiva, MMM, oropharynx clear, fundoscopic exam benign Neck- Supple, FROM CVS- RRR, no murmur RESP-CTAB EXT- No edema Pulses- Radial 2+        Assessment & Plan:

## 2013-03-10 ENCOUNTER — Ambulatory Visit: Payer: BC Managed Care – PPO | Admitting: Family Medicine

## 2013-03-13 ENCOUNTER — Telehealth: Payer: Self-pay | Admitting: Family Medicine

## 2013-03-13 NOTE — Telephone Encounter (Signed)
Pt would like for you to give her a call back  Call back number is [phone]

## 2013-03-14 ENCOUNTER — Ambulatory Visit: Payer: BC Managed Care – PPO | Admitting: Family Medicine

## 2013-03-14 ENCOUNTER — Other Ambulatory Visit: Payer: Self-pay | Admitting: *Deleted

## 2013-03-14 MED ORDER — ACCU-CHEK SOFTCLIX LANCETS MISC: Status: DC

## 2013-03-14 MED ORDER — GLUCOSE BLOOD VI STRP: ORAL_STRIP | Status: DC

## 2013-03-14 MED ORDER — ACCU-CHEK AVIVA DEVI: Status: DC

## 2013-03-14 NOTE — Telephone Encounter (Signed)
Called pt back she states that her BP is running low, she is scared to take her BP medication because of it running low..she says yesterday it was running 117/70 and today when she went to have her spinal tap it was 122/76 and again at 96/51. Wants to know if you want her to stay of meds or adjust them.

## 2013-03-14 NOTE — Telephone Encounter (Signed)
Pt aware of mesage

## 2013-03-14 NOTE — Telephone Encounter (Signed)
Besides the one after the spinal tap, which is low likley due to the medications, the other blood pressures are good Continue current medication doses

## 2013-04-06 ENCOUNTER — Other Ambulatory Visit: Payer: Self-pay | Admitting: Family Medicine

## 2013-04-07 ENCOUNTER — Encounter: Payer: Self-pay | Admitting: Physician Assistant

## 2013-04-07 ENCOUNTER — Other Ambulatory Visit: Payer: Self-pay | Admitting: Family Medicine

## 2013-04-07 ENCOUNTER — Ambulatory Visit (INDEPENDENT_AMBULATORY_CARE_PROVIDER_SITE_OTHER): Payer: BC Managed Care – PPO | Admitting: Physician Assistant

## 2013-04-07 VITALS — BP 118/80 | HR 68 | Temp 97.5°F | Resp 18 | Wt 150.0 lb

## 2013-04-07 DIAGNOSIS — B9689 Other specified bacterial agents as the cause of diseases classified elsewhere: Secondary | ICD-10-CM

## 2013-04-07 DIAGNOSIS — J988 Other specified respiratory disorders: Secondary | ICD-10-CM

## 2013-04-07 DIAGNOSIS — A499 Bacterial infection, unspecified: Secondary | ICD-10-CM

## 2013-04-07 MED ORDER — HYDROCODONE-ACETAMINOPHEN 10-325 MG PO TABS: 1.0000 | ORAL_TABLET | Freq: Three times a day (TID) | ORAL | Status: DC | PRN

## 2013-04-07 MED ORDER — LEVOFLOXACIN 750 MG PO TABS: 750.0000 mg | ORAL_TABLET | Freq: Every day | ORAL | Status: DC

## 2013-04-07 MED ORDER — ALBUTEROL SULFATE HFA 108 (90 BASE) MCG/ACT IN AERS: 2.0000 | INHALATION_SPRAY | Freq: Four times a day (QID) | RESPIRATORY_TRACT | Status: DC | PRN

## 2013-04-07 NOTE — Telephone Encounter (Signed)
Pt here for appt picked up script at this time

## 2013-04-07 NOTE — Progress Notes (Signed)
Patient ID: [name] MRN: [id number], DOB: Apr 29, 1965, 48 y.o. Date of Encounter: 04/07/2013, 2:26 PM    Chief Complaint:  Chief Complaint  Patient presents with  . Cough     HPI: 48 y.o. 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 Says that 7 days ago Dr. Kenton Kingfisher prescribed azithromycin 500 mg daily for 3 days. At that time they also prescribed prednisone 4 mg pills.. on day one she took 6 pills, day 2 took 5 pills, then 4, 3, 2, 1.  Says that prior to taking his medicines, he had a lot of nasal congestion and else stopped up weight. Says that at the time of doctor here as his exam her sinuses were very tender. He was getting mucus from her nose. She had some cough. She says that since taking the medication, "her head still doesn't feel quite right" "and also her chest still just not quite right.". Still has a lot of cough and still getting up phlegm. Still with drainage from her nose.  She does smoke but even prior to getting sick was working on cessation. Even prior to the illness she had gone down to 7 cigarettes per day. She is having no fevers or chills.     Home Meds: See attached medication section for any medications that were entered at today's visit. The computer does not put those onto this list.The following list is a list of meds entered prior to today's visit.   Current Outpatient Prescriptions on File Prior to Visit  Medication Sig Dispense Refill  . ACCU-CHEK SOFTCLIX LANCETS lancets Test once daily  100 each  2  . acyclovir (ZOVIRAX) 200 MG capsule Take 1 capsule (200 mg total) by mouth 2 (two) times daily.  60 capsule  0  . aspirin 81 MG tablet Take 81 mg by mouth daily.      Marland Kitchen atorvastatin (LIPITOR) 20 MG tablet Take 1 tablet (20 mg total) by mouth daily.  90 tablet  1  . benazepril (LOTENSIN) 10 MG tablet Take 20 mg by mouth daily.      . benazepril (LOTENSIN) 10 MG tablet TAKE ONE TABLET BY MOUTH ONCE DAILY  90 tablet  1  . Blood  Glucose Monitoring Suppl (ACCU-CHEK AVIVA) device Use as instructed  1 each  0  . butalbital-acetaminophen-caffeine (FIORICET) 50-325-40 MG per tablet Take 1-2 tablets by mouth every 6 (six) hours as needed for headache.  20 tablet  1  . diclofenac sodium (VOLTAREN) 1 % GEL Apply to affected area QID prn  1 Tube  3  . esomeprazole (NEXIUM) 40 MG capsule Take 1 capsule (40 mg total) by mouth daily.  90 capsule  1  . fluticasone (FLONASE) 50 MCG/ACT nasal spray Place 2 sprays into the nose daily. For sinuses/allergies  16 g  3  . glucose blood test strip Use as instructed  100 each  2  . ibuprofen (ADVIL,MOTRIN) 800 MG tablet Take 1 tablet (800 mg total) by mouth every 8 (eight) hours as needed for pain.  45 tablet  2  . levothyroxine (SYNTHROID, LEVOTHROID) 88 MCG tablet Take 1 tablet (88 mcg total) by mouth daily.  90 tablet  1  . Linaclotide 290 MCG CAPS Take 1 capsule by mouth daily.  30 capsule  6  . loratadine (CLARITIN) 10 MG tablet Take 1 tablet (10 mg total) by mouth daily.  30 tablet  2  . LORazepam (ATIVAN) 0.5 MG tablet Take  1 tablet (0.5 mg total) by mouth every 8 (eight) hours as needed for anxiety.  60 tablet  2  . medroxyPROGESTERone (PROVERA) 10 MG tablet Take 1 tablet (10 mg total) by mouth daily. Take one tablet daily by mouth for 10 consecutive days. Repeat this every 3 months for one year  10 tablet  3  . metFORMIN (GLUCOPHAGE) 500 MG tablet Take 1 tablet (500 mg total) by mouth 2 (two) times daily with a meal.  180 tablet  1  . promethazine (PHENERGAN) 12.5 MG tablet TAKE ONE TABLET BY MOUTH EVERY 6 HOURS AS NEEDED FOR NAUSEA  30 tablet  1  . propranolol ER (INDERAL LA) 60 MG 24 hr capsule Take 1 capsule (60 mg total) by mouth daily.  30 capsule  3  . traMADol (ULTRAM) 50 MG tablet Take by mouth every 6 (six) hours as needed.      . Vitamin D, Ergocalciferol, (DRISDOL) 50000 UNITS CAPS capsule Take 1 capsule (50,000 Units total) by mouth every 7 (seven) days.  30 capsule  2  .  zolpidem (AMBIEN) 10 MG tablet Take 1 tablet (10 mg total) by mouth at bedtime as needed.  30 tablet  2  . HYDROcodone-acetaminophen (NORCO) 10-325 MG per tablet Take 1 tablet by mouth every 8 (eight) hours as needed.  30 tablet  0   No current facility-administered medications on file prior to visit.    Allergies:  Allergies  Allergen Reactions  . Erythromycin   . Imitrex [Sumatriptan]   . Sulfa Antibiotics   . Topamax [Topiramate]     Nausea and worsening      Review of Systems: See HPI for pertinent ROS. All other ROS negative.    Physical Exam: Blood pressure 118/80, pulse 68, temperature 97.5 F (36.4 C), temperature source Oral, resp. rate 18, weight 150 lb (68.04 kg)., Body mass index is 26.15 kg/(m^2). General:  Appears in no acute distress. HEENT: Normocephalic, atraumatic, eyes without discharge, sclera non-icteric, nares are without discharge. Bilateral auditory canals clear, TM's are without perforation, pearly grey and translucent with reflective cone of light bilaterally. Oral cavity moist, posterior pharynx without exudate, erythema, peritonsillar abscess. No Tenderness with percussion of the sinuses. Neck: Supple. No thyromegaly. No lymphadenopathy. Lungs: Clear bilaterally to auscultation without wheezes, rales, or rhonchi. Breathing is unlabored. Lungs are clear. Hear no wheezes. Heart: Regular rhythm. No murmurs, rubs, or gallops. Msk:  Strength and tone normal for age. Extremities/Skin: Warm and dry. No clubbing or cyanosis. No edema. No rashes or suspicious lesions. Neuro: Alert and oriented X 3. Moves all extremities spontaneously. Gait is normal. CNII-XII grossly in tact. Psych:  Responds to questions appropriately with a normal affect.     ASSESSMENT AND PLAN:  48 y.o. year old female with  1. Bacterial respiratory infection Given her smoking history we'll go ahead and obtain x-ray. Will give albuterol inhaler to use 4 times a day for several days and  then can decrease to when necessary. If Symptoms do not resolve after completion of Levaquin,  followup. - levofloxacin (LEVAQUIN) 750 MG tablet; Take 1 tablet (750 mg total) by mouth daily.  Dispense: 7 tablet; Refill: 0 - albuterol (PROVENTIL HFA;VENTOLIN HFA) 108 (90 BASE) MCG/ACT inhaler; Inhale 2 puffs into the lungs every 6 (six) hours as needed for wheezing or shortness of breath.  Dispense: 1 Inhaler; Refill: 0 - DG Chest 2 View; Future   Signed, Olean Ree Felsenthal, [place], [facility] 04/07/2013 2:26 PM

## 2013-04-09 ENCOUNTER — Ambulatory Visit (HOSPITAL_COMMUNITY)
Admission: RE | Admit: 2013-04-09 | Discharge: 2013-04-09 | Disposition: A | Discharge status: Home / Self Care | Payer: BC Managed Care – PPO | Source: Ambulatory Visit | Attending: Physician Assistant | Admitting: Physician Assistant

## 2013-04-09 DIAGNOSIS — B9689 Other specified bacterial agents as the cause of diseases classified elsewhere: Secondary | ICD-10-CM

## 2013-04-09 DIAGNOSIS — R0989 Other specified symptoms and signs involving the circulatory and respiratory systems: Secondary | ICD-10-CM | POA: Insufficient documentation

## 2013-04-09 DIAGNOSIS — R0609 Other forms of dyspnea: Secondary | ICD-10-CM | POA: Insufficient documentation

## 2013-04-09 DIAGNOSIS — R05 Cough: Secondary | ICD-10-CM | POA: Insufficient documentation

## 2013-04-09 DIAGNOSIS — R059 Cough, unspecified: Secondary | ICD-10-CM | POA: Insufficient documentation

## 2013-04-09 DIAGNOSIS — J988 Other specified respiratory disorders: Secondary | ICD-10-CM

## 2013-04-10 ENCOUNTER — Telehealth: Payer: Self-pay | Admitting: Family Medicine

## 2013-04-10 NOTE — Telephone Encounter (Signed)
PT is calling back to speak to you about her xray Call back number is [phone]

## 2013-04-11 NOTE — Telephone Encounter (Signed)
Pt is calling back from were you called

## 2013-04-11 NOTE — Telephone Encounter (Signed)
Kim took care of this.Marland Kitchenpt cam in to see MBD

## 2013-04-25 ENCOUNTER — Ambulatory Visit: Payer: BC Managed Care – PPO | Admitting: Family Medicine

## 2013-04-25 ENCOUNTER — Ambulatory Visit: Payer: Self-pay | Admitting: Family Medicine

## 2013-04-28 ENCOUNTER — Other Ambulatory Visit: Payer: Self-pay | Admitting: Family Medicine

## 2013-04-28 ENCOUNTER — Telehealth: Payer: Self-pay | Admitting: Family Medicine

## 2013-04-28 NOTE — Telephone Encounter (Signed)
?   Ok to refill, last ov 12/05;last rf 02/26/13

## 2013-04-28 NOTE — Telephone Encounter (Signed)
PT is needing her Lorazepam refilled Call back number is [phone]

## 2013-04-28 NOTE — Telephone Encounter (Signed)
Okay to refill? 

## 2013-04-29 NOTE — Telephone Encounter (Signed)
Meds refilled.

## 2013-04-30 ENCOUNTER — Ambulatory Visit (INDEPENDENT_AMBULATORY_CARE_PROVIDER_SITE_OTHER): Payer: BC Managed Care – PPO | Admitting: Family Medicine

## 2013-04-30 VITALS — BP 138/86 | HR 88 | Temp 98.1°F | Resp 18 | Ht 63.5 in | Wt 152.5 lb

## 2013-04-30 DIAGNOSIS — E119 Type 2 diabetes mellitus without complications: Secondary | ICD-10-CM

## 2013-04-30 DIAGNOSIS — J449 Chronic obstructive pulmonary disease, unspecified: Secondary | ICD-10-CM

## 2013-04-30 DIAGNOSIS — G629 Polyneuropathy, unspecified: Secondary | ICD-10-CM

## 2013-04-30 DIAGNOSIS — R05 Cough: Secondary | ICD-10-CM

## 2013-04-30 DIAGNOSIS — E039 Hypothyroidism, unspecified: Secondary | ICD-10-CM

## 2013-04-30 DIAGNOSIS — R059 Cough, unspecified: Secondary | ICD-10-CM

## 2013-04-30 DIAGNOSIS — F329 Major depressive disorder, single episode, unspecified: Secondary | ICD-10-CM

## 2013-04-30 DIAGNOSIS — F32A Depression, unspecified: Secondary | ICD-10-CM

## 2013-04-30 DIAGNOSIS — N183 Chronic kidney disease, stage 3 unspecified: Secondary | ICD-10-CM

## 2013-04-30 DIAGNOSIS — F17200 Nicotine dependence, unspecified, uncomplicated: Secondary | ICD-10-CM

## 2013-04-30 DIAGNOSIS — G609 Hereditary and idiopathic neuropathy, unspecified: Secondary | ICD-10-CM

## 2013-04-30 DIAGNOSIS — F3289 Other specified depressive episodes: Secondary | ICD-10-CM

## 2013-04-30 DIAGNOSIS — E785 Hyperlipidemia, unspecified: Secondary | ICD-10-CM

## 2013-04-30 LAB — HEMOGLOBIN A1C, FINGERSTICK: Hgb A1C (fingerstick): 6.6 % — ABNORMAL HIGH (ref <5.7)

## 2013-04-30 MED ORDER — ATORVASTATIN CALCIUM 20 MG PO TABS: 20.0000 mg | ORAL_TABLET | Freq: Every day | ORAL | Status: DC

## 2013-04-30 MED ORDER — HYDROCODONE-ACETAMINOPHEN 10-325 MG PO TABS: 1.0000 | ORAL_TABLET | Freq: Three times a day (TID) | ORAL | Status: DC | PRN

## 2013-04-30 MED ORDER — DULOXETINE HCL 30 MG PO CPEP: 60.0000 mg | ORAL_CAPSULE | Freq: Every day | ORAL | Status: DC

## 2013-04-30 MED ORDER — HYDROCODONE-HOMATROPINE 5-1.5 MG/5ML PO SYRP: 5.0000 mL | ORAL_SOLUTION | Freq: Four times a day (QID) | ORAL | Status: DC | PRN

## 2013-04-30 NOTE — Patient Instructions (Signed)
Start cymbalta at 30mg  once a day for 1 week, then increase to 2 tablets daily  Cough syrup given do not take with hydocodone pills Get the labs done fasting- we will call with results if abnormal Restart liptor 20mg  at bedtime Call to let me know how cymbalta is doing for your feet or send pt email F/U 3 months

## 2013-04-30 NOTE — Progress Notes (Signed)
   Subjective:    Patient ID: Kathryn Oconnell, female    DOB: Feb 18, 1966, 48 y.o.   MRN: [id number]  HPI  Patient here to followup chronic medical problems. She was seen by neurology recently had workup for multiple sclerosis which was negative. She continues to have difficulty with the neuropathy in her feet and states it is going up her legs. She's tried multiple medications in she now has a lidocaine cream but this is not helping.  Also complains of cough with minimal production she has not had any wheezing she has tried some over-the-counter medications for the past couple days with minimal improvement. She was treated with Levaquin on January 5 by our office   Diabetes mellitus her blood sugars recently have been in the 200s fasting she does note that her diet had change. She's taking her metformin 1000 mg twice a day as prescribed   Hyperlipidemia she was recently on Crestor staples she did not notice a difference with her leg pain with within the Crestor and Lipitor or been off of the medication therefore she was to restart on her Lipitor   She continues to have a lot of stress surrounding her job and a long commute as well as her family members. She states that she is sleeping well but still just doesn't feel herself Review of Systems   GEN- denies fatigue, fever, weight loss,weakness, recent illness HEENT- denies eye drainage, change in vision, nasal discharge, CVS- denies chest pain, palpitations RESP- denies SOB, cough, wheeze ABD- denies N/V, change in stools, abd pain GU- denies dysuria, hematuria, dribbling, incontinence MSK- +joint pain, muscle aches, injury Neuro- denies headache, dizziness, syncope, seizure activity      Objective:   Physical Exam GEN- NAD, alert and oriented x3 HEENT- PERRL, EOMI, non injected sclera, pink conjunctiva, MMM, oropharynx clear,no maxillary sinus tenderness, TM clear bilat  Neck- Supple, no thryomegaly, no LAD CVS- RRR, no  murmur RESP-CTAB EXT- No edema Pulses- Radial, DP- 2+ Psych- stressed appearing, not overly anxious, good eye contact Feet- no open lesions      Assessment & Plan:

## 2013-05-01 ENCOUNTER — Other Ambulatory Visit: Payer: Self-pay | Admitting: Family Medicine

## 2013-05-02 DIAGNOSIS — R05 Cough: Secondary | ICD-10-CM | POA: Insufficient documentation

## 2013-05-02 DIAGNOSIS — R059 Cough, unspecified: Secondary | ICD-10-CM | POA: Insufficient documentation

## 2013-05-02 MED ORDER — LEVOTHYROXINE SODIUM 88 MCG PO TABS: 88.0000 ug | ORAL_TABLET | Freq: Every day | ORAL | Status: DC

## 2013-05-02 MED ORDER — LORAZEPAM 0.5 MG PO TABS: ORAL_TABLET | ORAL | Status: DC

## 2013-05-02 MED ORDER — METFORMIN HCL 1000 MG PO TABS: 1000.0000 mg | ORAL_TABLET | Freq: Two times a day (BID) | ORAL | Status: DC

## 2013-05-02 NOTE — Assessment & Plan Note (Signed)
She continues to cut back

## 2013-05-02 NOTE — Telephone Encounter (Signed)
rx called in

## 2013-05-02 NOTE — Assessment & Plan Note (Signed)
Recent illness, s/p antibiotics Cough syrup given, continue mucinex Albuterol inahler Needs smoking cessation

## 2013-05-02 NOTE — Assessment & Plan Note (Signed)
A1C looks great, continue metformin, continue to reinforce diet adherence

## 2013-05-02 NOTE — Assessment & Plan Note (Signed)
Continue synthroid.

## 2013-05-02 NOTE — Telephone Encounter (Signed)
Okay to refill  Please call in

## 2013-05-02 NOTE — Assessment & Plan Note (Signed)
Restart lipitor Fasting labs to be done

## 2013-05-02 NOTE — Telephone Encounter (Signed)
Last RF 9/8 #30 + 2.  Last OV 04/30/13  OK refill?

## 2013-05-02 NOTE — Assessment & Plan Note (Addendum)
Wouldn't give a trial of Cymbalta to see if this helps her neuropathy as well as her mood On hydrocodone prn as well

## 2013-05-02 NOTE — Assessment & Plan Note (Signed)
Cymbalta to be started 30mg  and increase to 60mg 

## 2013-05-02 NOTE — Assessment & Plan Note (Signed)
Recheck renal function. ?

## 2013-05-04 ENCOUNTER — Other Ambulatory Visit: Payer: Self-pay | Admitting: Family Medicine

## 2013-05-05 ENCOUNTER — Telehealth: Payer: Self-pay | Admitting: Family Medicine

## 2013-05-05 NOTE — Telephone Encounter (Signed)
[facility] in [place] Call back number [phone] Pt is needing a refill on her Ambien

## 2013-05-06 NOTE — Telephone Encounter (Signed)
Med refilled  On 05/01/13

## 2013-05-22 ENCOUNTER — Telehealth: Payer: Self-pay | Admitting: *Deleted

## 2013-05-22 NOTE — Telephone Encounter (Signed)
Wants to know if you can call something in for her yeast infection, wants Diflucan with one refill please.

## 2013-05-23 MED ORDER — FLUCONAZOLE 150 MG PO TABS: 150.0000 mg | ORAL_TABLET | Freq: Every day | ORAL | Status: DC

## 2013-05-23 NOTE — Telephone Encounter (Signed)
Med refill and pt aware via voice mail

## 2013-05-23 NOTE — Telephone Encounter (Signed)
Okay to call in [place] 150mg  x 1, refill 1

## 2013-05-27 ENCOUNTER — Ambulatory Visit (INDEPENDENT_AMBULATORY_CARE_PROVIDER_SITE_OTHER): Payer: BC Managed Care – PPO | Admitting: Family Medicine

## 2013-05-27 ENCOUNTER — Encounter: Payer: Self-pay | Admitting: Family Medicine

## 2013-05-27 ENCOUNTER — Ambulatory Visit: Payer: BC Managed Care – PPO | Admitting: Adult Health

## 2013-05-27 VITALS — BP 100/69 | HR 64 | Temp 98.0°F | Resp 18 | Ht 63.5 in | Wt 154.0 lb

## 2013-05-27 DIAGNOSIS — B373 Candidiasis of vulva and vagina: Secondary | ICD-10-CM

## 2013-05-27 DIAGNOSIS — K5900 Constipation, unspecified: Secondary | ICD-10-CM

## 2013-05-27 DIAGNOSIS — B3731 Acute candidiasis of vulva and vagina: Secondary | ICD-10-CM

## 2013-05-27 DIAGNOSIS — M25559 Pain in unspecified hip: Secondary | ICD-10-CM

## 2013-05-27 DIAGNOSIS — K5909 Other constipation: Secondary | ICD-10-CM

## 2013-05-27 LAB — URINALYSIS, ROUTINE W REFLEX MICROSCOPIC
Bilirubin Urine: NEGATIVE
Glucose, UA: NEGATIVE mg/dL
Hgb urine dipstick: NEGATIVE
Ketones, ur: NEGATIVE mg/dL
Leukocytes, UA: NEGATIVE
NITRITE: NEGATIVE
PROTEIN: NEGATIVE mg/dL
Specific Gravity, Urine: 1.015 (ref 1.005–1.030)
UROBILINOGEN UA: 0.2 mg/dL (ref 0.0–1.0)
pH: 7 (ref 5.0–8.0)

## 2013-05-27 LAB — WET PREP FOR TRICH, YEAST, CLUE
Clue Cells Wet Prep HPF POC: NONE SEEN
Trich, Wet Prep: NONE SEEN

## 2013-05-27 MED ORDER — HYDROCODONE-ACETAMINOPHEN 10-325 MG PO TABS: 1.0000 | ORAL_TABLET | Freq: Three times a day (TID) | ORAL | Status: DC | PRN

## 2013-05-27 MED ORDER — LUBIPROSTONE 24 MCG PO CAPS: 24.0000 ug | ORAL_CAPSULE | Freq: Two times a day (BID) | ORAL | Status: DC

## 2013-05-27 NOTE — Assessment & Plan Note (Signed)
Trial of amitiza

## 2013-05-27 NOTE — Progress Notes (Signed)
Patient ID: Kathryn Oconnell, female   DOB: March 29, 1966, 48 y.o.   MRN: [id number]     Subjective:    Patient ID: Kathryn Oconnell, female    DOB: [birth year], 48 y.o.   MRN: [id number]  Patient presents for Pelvic Pain  patient here with pelvic pain for the past week. She's not been sexually active in greater than one year. She has had some vaginal discharge and took the Diflucan pill last week. She's not had any vaginal bleeding and has been menopausal for the past 2 years. She did have an appointment scheduled with GYN this morning however they were close. She feels like the pain is near her ovaries. Occasionally she has some nausea associated.  She does have history of chronic constipation and has not had a bowel movement in 4 days. She was on Lantus previously but noticed that it was not helping therefore we discontinued this. She's been using magnesium citrate as well as MiraLAX and is only been having small bowel movements. She does not have any abdominal pain or bloating    Review Of Systems: -per above  GEN- denies fatigue, fever, weight loss,weakness, recent illness ABD- denies N/V, change in stools,+ abd pain GU- denies dysuria, hematuria, dribbling, incontinence        Objective:    BP 100/69  Pulse 64  Temp(Src) 98 F (36.7 C) (Oral)  Resp 18  Ht 5' 3.5" (1.613 m)  Wt 154 lb (69.854 kg)  BMI 26.85 kg/m2 GEN- NAD, alert and oriented x3 ABD-NABS,soft,NT,ND, no suprapubic tenderness GU- normal external genitalia, vaginal mucosa pink and moist, cervix visualized no growth, no blood form os, + discharge, no CMT, no ovarian masses, TTP left ovarian region > Right , uterus normal size           Assessment & Plan:      Problem List Items Addressed This Visit   Chronic constipation     Trial of amitiza     Other Visit Diagnoses   Pain in joint, pelvic region and thigh    -  Primary    UA neg, Wet prep Will have her f/u with GYN for ultrasound, possible ovarian cyst  causing pain on left side    Relevant Orders       Urinalysis, Routine w reflex microscopic (Completed)       WET PREP FOR TRICH, YEAST, CLUE       Note: This dictation was prepared with Dragon dictation along with smaller phrase technology. Any transcriptional errors that result from this process are unintentional.

## 2013-05-27 NOTE — Patient Instructions (Signed)
Take another dose of the diflucan Schedule visit with GYN for ultrasound Try the amitiza twice a day for constipation F/U as previous

## 2013-06-03 ENCOUNTER — Encounter: Payer: Self-pay | Admitting: Adult Health

## 2013-06-03 ENCOUNTER — Ambulatory Visit (INDEPENDENT_AMBULATORY_CARE_PROVIDER_SITE_OTHER): Payer: BC Managed Care – PPO | Admitting: Adult Health

## 2013-06-03 VITALS — BP 100/60 | Ht 63.5 in | Wt 143.0 lb

## 2013-06-03 DIAGNOSIS — B9689 Other specified bacterial agents as the cause of diseases classified elsewhere: Secondary | ICD-10-CM

## 2013-06-03 DIAGNOSIS — N898 Other specified noninflammatory disorders of vagina: Secondary | ICD-10-CM

## 2013-06-03 DIAGNOSIS — N760 Acute vaginitis: Secondary | ICD-10-CM

## 2013-06-03 DIAGNOSIS — A499 Bacterial infection, unspecified: Secondary | ICD-10-CM

## 2013-06-03 DIAGNOSIS — N949 Unspecified condition associated with female genital organs and menstrual cycle: Secondary | ICD-10-CM | POA: Insufficient documentation

## 2013-06-03 LAB — POCT WET PREP (WET MOUNT): WBC, Wet Prep HPF POC: POSITIVE

## 2013-06-03 MED ORDER — METRONIDAZOLE 500 MG PO TABS: 500.0000 mg | ORAL_TABLET | Freq: Two times a day (BID) | ORAL | Status: DC

## 2013-06-03 NOTE — Patient Instructions (Signed)
Bacterial Vaginosis Bacterial vaginosis is a vaginal infection that occurs when the normal balance of bacteria in the vagina is disrupted. It results from an overgrowth of certain bacteria. This is the most common vaginal infection in women of childbearing age. Treatment is important to prevent complications, especially in pregnant women, as it can cause a premature delivery. CAUSES  Bacterial vaginosis is caused by an increase in harmful bacteria that are normally present in smaller amounts in the vagina. Several different kinds of bacteria can cause bacterial vaginosis. However, the reason that the condition develops is not fully understood. RISK FACTORS Certain activities or behaviors can put you at an increased risk of developing bacterial vaginosis, including:  Having a new sex partner or multiple sex partners.  Douching.  Using an intrauterine device (IUD) for contraception. Women do not get bacterial vaginosis from toilet seats, bedding, swimming pools, or contact with objects around them. SIGNS AND SYMPTOMS  Some women with bacterial vaginosis have no signs or symptoms. Common symptoms include:  Grey vaginal discharge.  A fishlike odor with discharge, especially after sexual intercourse.  Itching or burning of the vagina and vulva.  Burning or pain with urination. DIAGNOSIS  Your health care provider will take a medical history and examine the vagina for signs of bacterial vaginosis. A sample of vaginal fluid may be taken. Your health care provider will look at this sample under a microscope to check for bacteria and abnormal cells. A vaginal pH test may also be done.  TREATMENT  Bacterial vaginosis may be treated with antibiotic medicines. These may be given in the form of a pill or a vaginal cream. A second round of antibiotics may be prescribed if the condition comes back after treatment.  HOME CARE INSTRUCTIONS   Only take over-the-counter or prescription medicines as  directed by your health care provider.  If antibiotic medicine was prescribed, take it as directed. Make sure you finish it even if you start to feel better.  Do not have sex until treatment is completed.  Tell all sexual partners that you have a vaginal infection. They should see their health care provider and be treated if they have problems, such as a mild rash or itching.  Practice safe sex by using condoms and only having one sex partner. SEEK MEDICAL CARE IF:   Your symptoms are not improving after 3 days of treatment.  You have increased discharge or pain.  You have a fever. MAKE SURE YOU:   Understand these instructions.  Will watch your condition.  Will get help right away if you are not doing well or get worse. FOR MORE INFORMATION  Centers for Disease Control and Prevention, Division of STD Prevention: [employer]raiser[employer]raud[employer] American Sexual Health Association (ASHA): www.ashastd.org  Document Released: 03/20/2005 Document Revised: 01/08/2013 Document Reviewed: 10/30/2012 [facility] Patient Information 2014 [place]. Return in 1 week for [place] and see me Take flagyl Pelvic Pain, Female Female pelvic pain can be caused by many different things and start from a variety of places. Pelvic pain refers to pain that is located in the lower half of the abdomen and between your hips. The pain may occur over a short period of time (acute) or may be reoccurring (chronic). The cause of pelvic pain may be related to disorders affecting the female reproductive organs (gynecologic), but it may also be related to the bladder, kidney stones, an intestinal complication, or muscle or skeletal problems. Getting help right away for pelvic pain is important, especially if there has  been severe, sharp, or a sudden onset of unusual pain. It is also important to get help right away because some types of pelvic pain can be life threatening.  CAUSES  Below are only some of the causes of pelvic pain. The  causes of pelvic pain can be in one of several categories.   Gynecologic.  Pelvic inflammatory disease.  Sexually transmitted infection.  Ovarian cyst or a twisted ovarian ligament (ovarian torsion).  Uterine lining that grows outside the uterus (endometriosis).  Fibroids, cysts, or tumors.  Ovulation.  Pregnancy.  Pregnancy that occurs outside the uterus (ectopic pregnancy).  Miscarriage.  Labor.  Abruption of the placenta or ruptured uterus.  Infection.  Uterine infection (endometritis).  Bladder infection.  Diverticulitis.  Miscarriage related to a uterine infection (septic abortion).  Bladder.  Inflammation of the bladder (cystitis).  Kidney stone(s).  Gastrointenstinal.  Constipation.  Diverticulitis.  Neurologic.  Trauma.  Feeling pelvic pain because of mental or emotional causes (psychosomatic).  Cancers of the bowel or pelvis. EVALUATION  Your caregiver will want to take a careful history of your concerns. This includes recent changes in your health, a careful gynecologic history of your periods (menses), and a sexual history. Obtaining your family history and medical history is also important. Your caregiver may suggest a pelvic exam. A pelvic exam will help identify the location and severity of the pain. It also helps in the evaluation of which organ system may be involved. In order to identify the cause of the pelvic pain and be properly treated, your caregiver may order tests. These tests may include:   A pregnancy test.  Pelvic ultrasonography.  An X-ray exam of the abdomen.  A urinalysis or evaluation of vaginal discharge.  Blood tests. HOME CARE INSTRUCTIONS   Only take over-the-counter or prescription medicines for pain, discomfort, or fever as directed by your caregiver.   Rest as directed by your caregiver.   Eat a balanced diet.   Drink enough fluids to make your urine clear or pale yellow, or as directed.   Avoid  sexual intercourse if it causes pain.   Apply warm or cold compresses to the lower abdomen depending on which one helps the pain.   Avoid stressful situations.   Keep a journal of your pelvic pain. Write down when it started, where the pain is located, and if there are things that seem to be associated with the pain, such as food or your menstrual cycle.  Follow up with your caregiver as directed.  SEEK MEDICAL CARE IF:  Your medicine does not help your pain.  You have abnormal vaginal discharge. SEEK IMMEDIATE MEDICAL CARE IF:   You have heavy bleeding from the vagina.   Your pelvic pain increases.   You feel lightheaded or faint.   You have chills.   You have pain with urination or blood in your urine.   You have uncontrolled diarrhea or vomiting.   You have a fever or persistent symptoms for more than 3 days.  You have a fever and your symptoms suddenly get worse.   You are being physically or sexually abused.  MAKE SURE YOU:  Understand these instructions.  Will watch your condition.  Will get help if you are not doing well or get worse. Document Released: 02/15/2004 Document Revised: 09/19/2011 Document Reviewed: 07/10/2011 [facility] Patient Information 2014 [place], [place].  no alcohol

## 2013-06-03 NOTE — Progress Notes (Signed)
Subjective:     Patient ID: Kathryn Oconnell, female   DOB: 1966-03-17, 48 y.o.   MRN: [id number]  HPI Kathryn Oconnell is a 49 year old black female in complaining of pain in pelvic area and vaginal discharge with burning, feels hot.Has been treated for yeast recently by Dr Carmel [place] history of ovarian cyst and thickened endometrium, was rx'd provera but did not have period so quit taking it.  Review of Systems See HPI Reviewed past medical,surgical, social and family history. Reviewed medications and allergies.     Objective:   Physical Exam BP 100/60  Ht 5' 3.5" (1.613 m)  Wt 143 lb (64.864 kg)  BMI 24.93 kg/m2 Skin warm and dry.Pelvic: external genitalia is normal in appearance, vagina: white discharge with odor, cervix:smooth and bulbous, uterus: normal size, shape and contour, non tender, no masses felt, adnexa: no masses, has LLQ tenderness noted. Wet prep: + for clue cells and +WBCs    Assessment:     Vaginal discharge BV Pelvic pain, LLQ tender    Plan:    Rx flagyl 500 mg 1 bid x 7 days, no alcohol, review handout on BV and pelvic pain Return in 1 week for [place] and see me

## 2013-06-05 ENCOUNTER — Telehealth: Payer: Self-pay | Admitting: Adult Health

## 2013-06-05 MED ORDER — METRONIDAZOLE 0.75 % VA GEL: 1.0000 | Freq: Two times a day (BID) | VAGINAL | Status: DC

## 2013-06-05 NOTE — Telephone Encounter (Signed)
Pt vomited with flagyl will rx metrogel

## 2013-06-09 ENCOUNTER — Telehealth: Payer: Self-pay | Admitting: Adult Health

## 2013-06-09 NOTE — Telephone Encounter (Signed)
Using Metrogel but not better and she took bath with chlorax in water, told her NOT to do that again and keep appt for 3/10

## 2013-06-11 ENCOUNTER — Ambulatory Visit (INDEPENDENT_AMBULATORY_CARE_PROVIDER_SITE_OTHER): Payer: BC Managed Care – PPO | Admitting: Adult Health

## 2013-06-11 ENCOUNTER — Ambulatory Visit (INDEPENDENT_AMBULATORY_CARE_PROVIDER_SITE_OTHER): Payer: BC Managed Care – PPO

## 2013-06-11 ENCOUNTER — Encounter: Payer: Self-pay | Admitting: Adult Health

## 2013-06-11 VITALS — BP 102/68 | Ht 63.5 in | Wt 146.0 lb

## 2013-06-11 DIAGNOSIS — N83202 Unspecified ovarian cyst, left side: Secondary | ICD-10-CM

## 2013-06-11 DIAGNOSIS — B9689 Other specified bacterial agents as the cause of diseases classified elsewhere: Secondary | ICD-10-CM

## 2013-06-11 DIAGNOSIS — N760 Acute vaginitis: Secondary | ICD-10-CM

## 2013-06-11 DIAGNOSIS — N949 Unspecified condition associated with female genital organs and menstrual cycle: Secondary | ICD-10-CM

## 2013-06-11 DIAGNOSIS — N8000 Endometriosis of the uterus, unspecified: Secondary | ICD-10-CM

## 2013-06-11 DIAGNOSIS — N83209 Unspecified ovarian cyst, unspecified side: Secondary | ICD-10-CM

## 2013-06-11 DIAGNOSIS — N898 Other specified noninflammatory disorders of vagina: Secondary | ICD-10-CM

## 2013-06-11 DIAGNOSIS — A499 Bacterial infection, unspecified: Secondary | ICD-10-CM

## 2013-06-11 NOTE — Patient Instructions (Signed)
Adenomyosis Return in 2 weeks to check discharge

## 2013-06-11 NOTE — Progress Notes (Signed)
Subjective:     Patient ID: Kathryn Oconnell, female   DOB: June 09, 1965, 48 y.o.   MRN: [id number]  HPI Kathryn Oconnell is a 48 year old black female in for [place] to assess LLQ pain and history of thickened endometrium, and no bleeding after provera,so quit taking it, LMP 2 years ago.Still complains of vaginal discharge but still using metrogel.  Review of Systems See HPI Reviewed past medical,surgical, social and family history. Reviewed medications and allergies.     Objective:   Physical Exam BP 102/68  Ht 5' 3.5" (1.613 m)  Wt 146 lb (66.225 kg)  BMI 25.45 kg/m2   Reviewed [place] with pt Uterus 6.9 x 4.6 x 3.9 cm, heterogeneous echotexture noted with "streaky" posterior shadows noted (?adenomyosis)  Endometrium 5.1 mm, difficult to evaluate well (?due to surrounding echotexture)  Right ovary 1.9 x 0.9 x 0.7 cm,  Left ovary 2.8 x 1.6 x 1.6 cm, with 12 x 10 mm cystic area cephalic to ovary with internal echoes noted within, no +Doppler noted within  No free fluid noted  Technician Comments:  Retroverted uterus with "streaky" posterior shadows noted, ENDOM-5.49mm, Rt ovary appears WNL, Lt ovary appears with 12 x 58mm cystic area noted cephalic to ovary with internal echoes noted within no Doppler flow noted within     Assessment:     Left ovarian cyst Adenomyosis Vaginal discharge  BV     Plan:    Google adenomyosis Take 4 tinidazole 500 mg now and 4 in am, samples given   Return in 2 weeks to re assess the discharge Will talk in am about [place] after I discuss with Dr Kathryn Oconnell

## 2013-06-12 ENCOUNTER — Telehealth: Payer: Self-pay | Admitting: Adult Health

## 2013-06-12 MED ORDER — MEDROXYPROGESTERONE ACETATE 2.5 MG PO TABS: 2.5000 mg | ORAL_TABLET | Freq: Every day | ORAL | Status: DC

## 2013-06-12 NOTE — Telephone Encounter (Signed)
Discussed [place] with Dr Elonda Husky, has cyst and ?adenomyosis, rx provera 2.5 mg 1 daily, will [place] in 3 months, pt aware

## 2013-06-16 ENCOUNTER — Telehealth: Payer: Self-pay | Admitting: *Deleted

## 2013-06-16 NOTE — Telephone Encounter (Signed)
Pt states discharge getting worse. Pt states medications not helping. Appointment made for tomorrow morning with Derrek Monaco, NP at 9 am.

## 2013-06-17 ENCOUNTER — Ambulatory Visit (INDEPENDENT_AMBULATORY_CARE_PROVIDER_SITE_OTHER): Payer: BC Managed Care – PPO | Admitting: Adult Health

## 2013-06-17 ENCOUNTER — Encounter: Payer: Self-pay | Admitting: Adult Health

## 2013-06-17 VITALS — BP 112/78 | Ht 63.5 in | Wt 146.0 lb

## 2013-06-17 DIAGNOSIS — B379 Candidiasis, unspecified: Secondary | ICD-10-CM

## 2013-06-17 DIAGNOSIS — N898 Other specified noninflammatory disorders of vagina: Secondary | ICD-10-CM

## 2013-06-17 MED ORDER — FLUCONAZOLE 100 MG PO TABS: ORAL_TABLET | ORAL | Status: DC

## 2013-06-17 NOTE — Progress Notes (Signed)
Subjective:     Patient ID: Kathryn Oconnell, female   DOB: 1966/03/02, 48 y.o.   MRN: [id number]  HPI Kathryn Oconnell is back complaining of paste like discharge and vagina burns.Hass finished Metrogel.  Review of Systems See HPI Reviewed past medical,surgical, social and family history. Reviewed medications and allergies.     Objective:   Physical Exam BP 112/78  Ht 5' 3.5" (1.613 m)  Wt 146 lb (66.225 kg)  BMI 25.45 kg/m2   Skin warm and dry.Pelvic: external genitalia is normal in appearance, vagina: white discharge like Metrogel but sidewalls mildly red, noodor, cervix:smooth and bulbous, uterus: normal size, shape and contour, non tender, no masses felt, adnexa: no masses or tenderness noted.Dr Elonda Husky in to co examine and explained vagina eco system. Assessment:     Yeast Vaginal discharge    Plan:     Rx diflucan 100 mg 1 daily x 7 days, do not take Lipitor these days Return in 10 days to recheck if still burning may try premarin vaginal cream   Review handout on yeast

## 2013-06-17 NOTE — Patient Instructions (Signed)
Take diflucan daily x 7 days Follow up in 10 days Monilial Vaginitis Vaginitis in a soreness, swelling and redness (inflammation) of the vagina and vulva. Monilial vaginitis is not a sexually transmitted infection. CAUSES  Yeast vaginitis is caused by yeast (candida) that is normally found in your vagina. With a yeast infection, the candida has overgrown in number to a point that upsets the chemical balance. SYMPTOMS   White, thick vaginal discharge.  Swelling, itching, redness and irritation of the vagina and possibly the lips of the vagina (vulva).  Burning or painful urination.  Painful intercourse. DIAGNOSIS  Things that may contribute to monilial vaginitis are:  Postmenopausal and virginal states.  Pregnancy.  Infections.  Being tired, sick or stressed, especially if you had monilial vaginitis in the past.  Diabetes. Good control will help lower the chance.  Birth control pills.  Tight fitting garments.  Using bubble bath, feminine sprays, douches or deodorant tampons.  Taking certain medications that kill germs (antibiotics).  Sporadic recurrence can occur if you become ill. TREATMENT  Your caregiver will give you medication.  There are several kinds of anti monilial vaginal creams and suppositories specific for monilial vaginitis. For recurrent yeast infections, use a suppository or cream in the vagina 2 times a week, or as directed.  Anti-monilial or steroid cream for the itching or irritation of the vulva may also be used. Get your caregiver's permission.  Painting the vagina with methylene blue solution may help if the monilial cream does not work.  Eating yogurt may help prevent monilial vaginitis. HOME CARE INSTRUCTIONS   Finish all medication as prescribed.  Do not have sex until treatment is completed or after your caregiver tells you it is okay.  Take warm sitz baths.  Do not douche.  Do not use tampons, especially scented ones.  Wear cotton  underwear.  Avoid tight pants and panty hose.  Tell your sexual partner that you have a yeast infection. They should go to their caregiver if they have symptoms such as mild rash or itching.  Your sexual partner should be treated as well if your infection is difficult to eliminate.  Practice safer sex. Use condoms.  Some vaginal medications cause latex condoms to fail. Vaginal medications that harm condoms are:  Cleocin cream.  Butoconazole (Femstat).  Terconazole (Terazol) vaginal suppository.  Miconazole (Monistat) (may be purchased over the counter). SEEK MEDICAL CARE IF:   You have a temperature by mouth above 102 F (38.9 C).  The infection is getting worse after 2 days of treatment.  The infection is not getting better after 3 days of treatment.  You develop blisters in or around your vagina.  You develop vaginal bleeding, and it is not your menstrual period.  You have pain when you urinate.  You develop intestinal problems.  You have pain with sexual intercourse. Document Released: 12/28/2004 Document Revised: 06/12/2011 Document Reviewed: 09/11/2008 [facility] Patient Information 2014 [place], [place].

## 2013-06-25 ENCOUNTER — Other Ambulatory Visit: Payer: Self-pay | Admitting: Family Medicine

## 2013-06-25 ENCOUNTER — Encounter: Payer: Self-pay | Admitting: Adult Health

## 2013-06-25 ENCOUNTER — Ambulatory Visit (INDEPENDENT_AMBULATORY_CARE_PROVIDER_SITE_OTHER): Payer: BC Managed Care – PPO | Admitting: Adult Health

## 2013-06-25 VITALS — BP 102/64 | Ht 63.5 in | Wt 145.0 lb

## 2013-06-25 DIAGNOSIS — N938 Other specified abnormal uterine and vaginal bleeding: Secondary | ICD-10-CM

## 2013-06-25 DIAGNOSIS — N949 Unspecified condition associated with female genital organs and menstrual cycle: Secondary | ICD-10-CM

## 2013-06-25 DIAGNOSIS — N925 Other specified irregular menstruation: Secondary | ICD-10-CM

## 2013-06-25 MED ORDER — HYDROCODONE-ACETAMINOPHEN 10-325 MG PO TABS: 1.0000 | ORAL_TABLET | Freq: Three times a day (TID) | ORAL | Status: DC | PRN

## 2013-06-25 NOTE — Progress Notes (Signed)
Subjective:     Patient ID: Kathryn Oconnell, female   DOB: 1965-11-02, 48 y.o.   MRN: [id number]  HPI Kathryn Oconnell is back in follow up of vaginal burning and discomfort, was treated with diflucan x 1 week, and it is better.She started provera 2.5 mg 1 daily and started spotting yesterday and is bleeding today, has some discomfort in right side.  Review of Systems See HPI Reviewed past medical,surgical, social and family history. Reviewed medications and allergies.      Objective:   Physical Exam BP 102/64  Ht 5' 3.5" (1.613 m)  Wt 145 lb (65.772 kg)  BMI 25.28 kg/m2   talk only see HPI  Assessment:     Vaginal bleeding after starting provera Resolution of vaginal burning    Plan:     Call if bleeding lasts longer than 10 days or increase in discomfort, and will get [place] before 3 months

## 2013-06-25 NOTE — Telephone Encounter (Signed)
Prescription printed and patient made aware to come to office to pick up.  

## 2013-06-25 NOTE — Patient Instructions (Signed)
Call if bleeding continues for 10 days

## 2013-07-03 ENCOUNTER — Telehealth: Payer: Self-pay | Admitting: Adult Health

## 2013-07-07 NOTE — Telephone Encounter (Signed)
Keep taking provera 2.5 mg 1 daily and return in 3 months for [place]

## 2013-07-07 NOTE — Telephone Encounter (Signed)
Pt stopped bleeding on Thursday. Pt still taking the Provera, wants to know what the next step is and when she is supposed to come back.

## 2013-07-10 ENCOUNTER — Other Ambulatory Visit: Payer: Self-pay

## 2013-07-18 ENCOUNTER — Telehealth: Payer: Self-pay | Admitting: Family Medicine

## 2013-07-18 MED ORDER — HYDROCODONE-ACETAMINOPHEN 10-325 MG PO TABS: 1.0000 | ORAL_TABLET | Freq: Three times a day (TID) | ORAL | Status: DC | PRN

## 2013-07-18 NOTE — Telephone Encounter (Signed)
Call back number is [phone] Pt is needing a refill on HYDROcodone-acetaminophen (NORCO) 10-325 MG per tablet

## 2013-07-18 NOTE — Telephone Encounter (Signed)
Prescription printed and patient made aware to come to office to pick up.  

## 2013-07-18 NOTE — Telephone Encounter (Signed)
Ok to refill??  Last office visit 05/27/2013.  Last refill 06/25/2013.

## 2013-07-18 NOTE — Telephone Encounter (Signed)
Okay to refill? 

## 2013-07-21 ENCOUNTER — Other Ambulatory Visit: Payer: Self-pay | Admitting: Family Medicine

## 2013-07-21 ENCOUNTER — Telehealth: Payer: Self-pay | Admitting: Family Medicine

## 2013-07-22 NOTE — Telephone Encounter (Signed)
No available appts now, pls let her know

## 2013-07-22 NOTE — Telephone Encounter (Signed)
Last Rf 12/15/13 #30 +1   OK refill?

## 2013-07-25 NOTE — Telephone Encounter (Signed)
Patient is aware 

## 2013-07-29 ENCOUNTER — Ambulatory Visit (INDEPENDENT_AMBULATORY_CARE_PROVIDER_SITE_OTHER): Payer: BC Managed Care – PPO | Admitting: Family Medicine

## 2013-07-29 ENCOUNTER — Encounter: Payer: Self-pay | Admitting: Family Medicine

## 2013-07-29 VITALS — BP 128/74 | HR 76 | Temp 98.4°F | Resp 18 | Ht 62.5 in | Wt 142.0 lb

## 2013-07-29 DIAGNOSIS — I10 Essential (primary) hypertension: Secondary | ICD-10-CM

## 2013-07-29 DIAGNOSIS — N898 Other specified noninflammatory disorders of vagina: Secondary | ICD-10-CM

## 2013-07-29 DIAGNOSIS — N760 Acute vaginitis: Secondary | ICD-10-CM

## 2013-07-29 DIAGNOSIS — F32A Depression, unspecified: Secondary | ICD-10-CM

## 2013-07-29 DIAGNOSIS — F3289 Other specified depressive episodes: Secondary | ICD-10-CM

## 2013-07-29 DIAGNOSIS — N183 Chronic kidney disease, stage 3 unspecified: Secondary | ICD-10-CM

## 2013-07-29 DIAGNOSIS — G629 Polyneuropathy, unspecified: Secondary | ICD-10-CM

## 2013-07-29 DIAGNOSIS — G4700 Insomnia, unspecified: Secondary | ICD-10-CM

## 2013-07-29 DIAGNOSIS — E119 Type 2 diabetes mellitus without complications: Secondary | ICD-10-CM

## 2013-07-29 DIAGNOSIS — G609 Hereditary and idiopathic neuropathy, unspecified: Secondary | ICD-10-CM

## 2013-07-29 DIAGNOSIS — E785 Hyperlipidemia, unspecified: Secondary | ICD-10-CM

## 2013-07-29 DIAGNOSIS — F329 Major depressive disorder, single episode, unspecified: Secondary | ICD-10-CM

## 2013-07-29 LAB — WET PREP FOR TRICH, YEAST, CLUE
CLUE CELLS WET PREP: NONE SEEN
TRICH WET PREP: NONE SEEN
Yeast Wet Prep HPF POC: NONE SEEN

## 2013-07-29 NOTE — Patient Instructions (Signed)
Fasting labs on Friday We will call with results this afternoon Continue current medications F/U 3 months

## 2013-07-30 NOTE — Assessment & Plan Note (Signed)
Unchanged continue to work on her diabetes keep this under control. I have refilled her hydrocodone already

## 2013-07-30 NOTE — Assessment & Plan Note (Signed)
Will plan for fasting labs which she comes in Friday with her daughter. For now continue current medications

## 2013-07-30 NOTE — Assessment & Plan Note (Signed)
Continue Ambien as needed for sleep

## 2013-07-30 NOTE — Assessment & Plan Note (Signed)
Renal function will be rechecked

## 2013-07-30 NOTE — Progress Notes (Signed)
Patient ID: Kathryn Oconnell, female   DOB: December 11, 1965, 48 y.o.   MRN: [id number]   Subjective:    Patient ID: Kathryn Oconnell, female    DOB: [birth year], 48 y.o.   MRN: [id number]  Patient presents for 3 month F/U and discuss vaginal check  patient here follow chronic medical problems. She's not been taking her Cymbalta because she states that it does not help her neuropathy or her mood and that should the only thing that helps her neuropathy is the hydrocodone. She's under a lot of stress because of her daughter who was recently diagnosed with bipolar disorder.  Diabetes mellitus she did not bring her meter with her today she is due for repeat fasting labs and A1c. States her sugars have been okay when she does take them and that she's been taking her medication as prescribed  Was seen by GYN and had a recent bacterial vaginosis she thinks that it has returned and she's had some discharge that she's not currently sexually active.     Review Of Systems:  GEN- denies fatigue, fever, weight loss,weakness, recent illness HEENT- denies eye drainage, change in vision, nasal discharge, CVS- denies chest pain, palpitations RESP- denies SOB, cough, wheeze ABD- denies N/V, change in stools, abd pain GU- denies dysuria, hematuria, dribbling, incontinence MSK- + joint pain, +muscle aches, injury Neuro- denies headache, dizziness, syncope, seizure activity       Objective:    BP 128/74  Pulse 76  Temp(Src) 98.4 F (36.9 C) (Oral)  Resp 18  Ht 5' 2.5" (1.588 m)  Wt 142 lb (64.411 kg)  BMI 25.54 kg/m2  LMP 06/18/2013 GEN- NAD, alert and oriented x3 HEENT- PERRL, EOMI, non injected sclera, pink conjunctiva, MMM, oropharynx clear Neck- Supple,  CVS- RRR, no murmur RESP-CTAB ABD-NABS,soft,NT,ND,  GU- normal external genitalia, vaginal mucosa pink and moist, cervix visualized no growth, no blood form os, + discharge, no CMT, no ovarian masses,  , uterus normal size Psych- stressed appearing,  not overly anxious, well groomed, no apparent SI EXT- No edema Pulses- Radial, DP- 2+        Assessment & Plan:      Problem List Items Addressed This Visit   Hyperlipidemia   Essential hypertension, benign   CKD (chronic kidney disease) stage 3, GFR 30-59 ml/min    Other Visit Diagnoses   Vaginitis and vulvovaginitis    -  Primary    Relevant Orders       WET PREP FOR [place], YEAST, CLUE (Completed)       Note: This dictation was prepared with Dragon dictation along with smaller phrase technology. Any transcriptional errors that result from this process are unintentional.

## 2013-07-30 NOTE — Assessment & Plan Note (Signed)
Wet prep is negative or any acute affect

## 2013-07-30 NOTE — Assessment & Plan Note (Signed)
Think she would benefit from medications has tried a few but does not want to be on any at this time. She is still taking the lorazepam as needed

## 2013-08-01 ENCOUNTER — Other Ambulatory Visit: Payer: Self-pay | Admitting: Family Medicine

## 2013-08-01 ENCOUNTER — Other Ambulatory Visit: Payer: BC Managed Care – PPO

## 2013-08-01 LAB — CBC WITH DIFFERENTIAL/PLATELET
Basophils Absolute: 0 10*3/uL (ref 0.0–0.1)
Basophils Relative: 0 % (ref 0–1)
EOS ABS: 0.2 10*3/uL (ref 0.0–0.7)
Eosinophils Relative: 2 % (ref 0–5)
HEMATOCRIT: 34.8 % — AB (ref 36.0–46.0)
Hemoglobin: 11.6 g/dL — ABNORMAL LOW (ref 12.0–15.0)
LYMPHS ABS: 3.7 10*3/uL (ref 0.7–4.0)
LYMPHS PCT: 39 % (ref 12–46)
MCH: 30.5 pg (ref 26.0–34.0)
MCHC: 33.3 g/dL (ref 30.0–36.0)
MCV: 91.6 fL (ref 78.0–100.0)
MONO ABS: 0.7 10*3/uL (ref 0.1–1.0)
Monocytes Relative: 7 % (ref 3–12)
Neutro Abs: 4.9 10*3/uL (ref 1.7–7.7)
Neutrophils Relative %: 52 % (ref 43–77)
PLATELETS: 285 10*3/uL (ref 150–400)
RBC: 3.8 MIL/uL — AB (ref 3.87–5.11)
RDW: 15.1 % (ref 11.5–15.5)
WBC: 9.5 10*3/uL (ref 4.0–10.5)

## 2013-08-01 LAB — LIPID PANEL
Cholesterol: 138 mg/dL (ref 0–200)
HDL: 34 mg/dL — AB (ref >39)
LDL Cholesterol: 87 mg/dL (ref 0–99)
Total CHOL/HDL Ratio: 4.1 Ratio
Triglycerides: 85 mg/dL (ref <150)
VLDL: 17 mg/dL (ref 0–40)

## 2013-08-01 LAB — COMPREHENSIVE METABOLIC PANEL
ALT: 12 U/L (ref 0–35)
AST: 16 U/L (ref 0–37)
Albumin: 4 g/dL (ref 3.5–5.2)
Alkaline Phosphatase: 46 U/L (ref 39–117)
BUN: 14 mg/dL (ref 6–23)
CHLORIDE: 103 meq/L (ref 96–112)
CO2: 19 mEq/L (ref 19–32)
CREATININE: 1.2 mg/dL — AB (ref 0.50–1.10)
Calcium: 9.3 mg/dL (ref 8.4–10.5)
GLUCOSE: 171 mg/dL — AB (ref 70–99)
Potassium: 4.5 mEq/L (ref 3.5–5.3)
Sodium: 133 mEq/L — ABNORMAL LOW (ref 135–145)
Total Bilirubin: 0.4 mg/dL (ref 0.2–1.2)
Total Protein: 6.7 g/dL (ref 6.0–8.3)

## 2013-08-01 LAB — TSH: TSH: 1.073 u[IU]/mL (ref 0.350–4.500)

## 2013-08-01 MED ORDER — HYDROCODONE-ACETAMINOPHEN 10-325 MG PO TABS
1.0000 | ORAL_TABLET | Freq: Three times a day (TID) | ORAL | Status: DC | PRN
Start: 2013-08-01 — End: 2013-08-27

## 2013-08-01 NOTE — Telephone Encounter (Signed)
Last Rf 07/18/13 #45.  Last OV 07/29/13  OK refill?

## 2013-08-04 LAB — HEMOGLOBIN A1C
Hgb A1c MFr Bld: 6.8 % — ABNORMAL HIGH (ref <5.7)
Mean Plasma Glucose: 148 mg/dL — ABNORMAL HIGH (ref <117)

## 2013-08-18 ENCOUNTER — Telehealth: Payer: Self-pay | Admitting: Family Medicine

## 2013-08-18 MED ORDER — ESCITALOPRAM OXALATE 20 MG PO TABS: ORAL_TABLET | ORAL | Status: DC

## 2013-08-18 NOTE — Telephone Encounter (Signed)
Prescription sent to pharmacy.  LMTRC ?

## 2013-08-18 NOTE — Telephone Encounter (Signed)
LMTRC

## 2013-08-18 NOTE — Telephone Encounter (Signed)
Call returned from patient.   Reports that she is having increased difficulty and stress in her life and requested to have MD prescribe Lexapro 20mg . States that she understands that this needs to be tapered up, but she could break tabs in half until she needed to start the full dosage.   MD please advise.

## 2013-08-18 NOTE — Telephone Encounter (Signed)
[phone] Pt is wanting to speak to you about getting lexapro 20 mg

## 2013-08-18 NOTE — Telephone Encounter (Signed)
Send in lexapro 20mg , take 1/2 tablet daily for 2 weeks, then 1 tablet, schedule a f/u appt for next 2-3 weeks

## 2013-08-19 NOTE — Telephone Encounter (Signed)
Call placed to patient and patient made aware per VM.  

## 2013-08-27 ENCOUNTER — Telehealth: Payer: Self-pay | Admitting: Family Medicine

## 2013-08-27 MED ORDER — HYDROCODONE-ACETAMINOPHEN 10-325 MG PO TABS: 1.0000 | ORAL_TABLET | Freq: Three times a day (TID) | ORAL | Status: DC | PRN

## 2013-08-27 NOTE — Telephone Encounter (Signed)
Ok to refill??  Last office visit 08/01/2013.   Last refill 07/29/2013.

## 2013-08-27 NOTE — Telephone Encounter (Signed)
Prescription printed and patient made aware to come to office to pick up.  

## 2013-08-27 NOTE — Telephone Encounter (Signed)
Patient would like script for her hydrocodone if possible  Please call her at [phone] when ready

## 2013-08-27 NOTE — Telephone Encounter (Signed)
Okay to refill? 

## 2013-09-02 ENCOUNTER — Other Ambulatory Visit: Payer: Self-pay | Admitting: Adult Health

## 2013-09-02 DIAGNOSIS — IMO0002 Reserved for concepts with insufficient information to code with codable children: Secondary | ICD-10-CM

## 2013-09-08 ENCOUNTER — Other Ambulatory Visit: Payer: BC Managed Care – PPO

## 2013-09-08 ENCOUNTER — Ambulatory Visit: Payer: BC Managed Care – PPO | Admitting: Adult Health

## 2013-09-09 ENCOUNTER — Other Ambulatory Visit: Payer: BC Managed Care – PPO

## 2013-09-09 ENCOUNTER — Ambulatory Visit: Payer: BC Managed Care – PPO | Admitting: Adult Health

## 2013-09-10 ENCOUNTER — Telehealth: Payer: Self-pay | Admitting: Family Medicine

## 2013-09-10 NOTE — Telephone Encounter (Signed)
Ok to refill??  Last office visit 07/29/2013.  Last refill 05/02/2013.

## 2013-09-10 NOTE — Telephone Encounter (Signed)
Call placed to patient.   Requested to just increase medication.   Advised MD would need to see patient.   Appointment scheduled.

## 2013-09-10 NOTE — Telephone Encounter (Signed)
[phone]  PT is wanting LORazepam (ATIVAN) 0.5 MG tablet an increase in an amount

## 2013-09-10 NOTE — Telephone Encounter (Signed)
SHe is getting 60 tablets, this should be enough, we can discuss at her apt Okay to refill

## 2013-09-12 ENCOUNTER — Ambulatory Visit (INDEPENDENT_AMBULATORY_CARE_PROVIDER_SITE_OTHER): Payer: BC Managed Care – PPO | Admitting: Family Medicine

## 2013-09-12 ENCOUNTER — Encounter: Payer: Self-pay | Admitting: Family Medicine

## 2013-09-12 VITALS — BP 128/76 | HR 82 | Temp 98.1°F | Resp 16 | Ht 63.0 in | Wt 142.0 lb

## 2013-09-12 DIAGNOSIS — F329 Major depressive disorder, single episode, unspecified: Secondary | ICD-10-CM

## 2013-09-12 DIAGNOSIS — F419 Anxiety disorder, unspecified: Secondary | ICD-10-CM

## 2013-09-12 DIAGNOSIS — F32A Depression, unspecified: Secondary | ICD-10-CM

## 2013-09-12 DIAGNOSIS — F411 Generalized anxiety disorder: Secondary | ICD-10-CM

## 2013-09-12 DIAGNOSIS — R0789 Other chest pain: Secondary | ICD-10-CM

## 2013-09-12 DIAGNOSIS — F3289 Other specified depressive episodes: Secondary | ICD-10-CM

## 2013-09-12 MED ORDER — IBUPROFEN 800 MG PO TABS: 800.0000 mg | ORAL_TABLET | Freq: Three times a day (TID) | ORAL | Status: DC | PRN

## 2013-09-12 MED ORDER — LORAZEPAM 0.5 MG PO TABS: ORAL_TABLET | ORAL | Status: DC

## 2013-09-12 MED ORDER — ZOLPIDEM TARTRATE 10 MG PO TABS: ORAL_TABLET | ORAL | Status: DC

## 2013-09-12 MED ORDER — ESCITALOPRAM OXALATE 20 MG PO TABS: ORAL_TABLET | ORAL | Status: DC

## 2013-09-12 MED ORDER — HYDROCODONE-ACETAMINOPHEN 10-325 MG PO TABS: 1.0000 | ORAL_TABLET | Freq: Three times a day (TID) | ORAL | Status: DC | PRN

## 2013-09-12 MED ORDER — METFORMIN HCL 1000 MG PO TABS: 1000.0000 mg | ORAL_TABLET | Freq: Two times a day (BID) | ORAL | Status: DC

## 2013-09-12 NOTE — Progress Notes (Signed)
Patient ID: Kathryn Oconnell, female   DOB: 1965-12-18, 48 y.o.   MRN: [id number]   Subjective:    Patient ID: Kathryn Oconnell, female    DOB: Apr 15, 1965, 49 y.o.   MRN: [id number]  Patient presents for Chest pain  Patient here to follow up medications. She actually called in initially asking for Lexapro 10 a couple weeks later she called and stated she wanted her lorazepam increase in eye asked her to make an appointment to see what was going on. It she never started the Lexapro , she was on it for about 6 years the past and did well with the medication but felt like some days she was a zombie .She's had a lot of increased stress with her daughter who is a Electronics engineer diagnosis of bipolar but refuses to take medications. She also had a stress on her job she was pulled in secondary to and off, and that she made it she feels like her job is now in jeopardy and she's been scrutinized at work. She feels like his way to much stress to him the white female. She's been having increased fatigue over the past week also to do his stay in the bed. Last night she had a short-lived episode of left side chest discomfort no nausea vomiting associated no diaphoresis no shortness of breath. She has been taking her other medications as prescribed.    Review Of Systems:  GEN- + fatigue, fever, weight loss,weakness, recent illness HEENT- denies eye drainage, change in vision, nasal discharge, CVS- +chest pain, palpitations RESP- denies SOB, cough, wheeze ABD- denies N/V, change in stools, abd pain MSK- +joint pain, muscle aches, injury Neuro- denies headache, dizziness, syncope, seizure activity       Objective:    BP 128/76  Pulse 82  Temp(Src) 98.1 F (36.7 C) (Oral)  Resp 16  Ht 5\' 3"  (1.6 m)  Wt 142 lb (64.411 kg)  BMI 25.16 kg/m2  LMP 06/18/2013 GEN- NAD, alert and oriented x3 HEENT- PERRL, EOMI, non injected sclera, pink conjunctiva, MMM, oropharynx clear Neck- Supple, no thyromegaly CVS- RRR, no  murmur RESP-CTAB ABD-NABS,soft,NT,ND PSYCH- Depressed affect, not anxious appearing, no SI, normal speech EXT- No edema Pulses- Radial 2+  EKG- NSR, no ST changes       Assessment & Plan:      Problem List Items Addressed This Visit   Depression     I will have her go ahead and start the Lexapro think she will benefit from its use. Her depression and anxiety are uncontrolled there a lot of stressors in her home life as well as the job. She will followup in 4 weeks for interim followup on medications    Relevant Medications      escitalopram (LEXAPRO) tablet      LORazepam (ATIVAN) tablet   Atypical chest pain - Primary     EKG is reassuring I think her chest pain is atypical related to her depression and anxiety. She does have risk factors for coronary artery disease therefore this reoccurs she may need to be seen by cardiology    Relevant Orders      CBC with Differential      Comprehensive metabolic panel   Anxiety     Per above regarding lorazepam and Lexapro    Relevant Medications      escitalopram (LEXAPRO) tablet      LORazepam (ATIVAN) tablet      Note: This dictation was prepared with Dragon dictation along  with smaller phrase technology. Any transcriptional errors that result from this process are unintentional.

## 2013-09-12 NOTE — Assessment & Plan Note (Signed)
I will have her go ahead and start the Lexapro think she will benefit from its use. Her depression and anxiety are uncontrolled there a lot of stressors in her home life as well as the job. She will followup in 4 weeks for interim followup on medications

## 2013-09-12 NOTE — Patient Instructions (Signed)
Start the [employer] We will call with labs F/U as previous

## 2013-09-12 NOTE — Assessment & Plan Note (Signed)
Per above regarding lorazepam and Lexapro

## 2013-09-12 NOTE — Assessment & Plan Note (Signed)
EKG is reassuring I think her chest pain is atypical related to her depression and anxiety. She does have risk factors for coronary artery disease therefore this reoccurs she may need to be seen by cardiology

## 2013-09-13 LAB — COMPREHENSIVE METABOLIC PANEL
ALBUMIN: 4.2 g/dL (ref 3.5–5.2)
ALT: 12 U/L (ref 0–35)
AST: 19 U/L (ref 0–37)
Alkaline Phosphatase: 49 U/L (ref 39–117)
BUN: 24 mg/dL — ABNORMAL HIGH (ref 6–23)
CHLORIDE: 105 meq/L (ref 96–112)
CO2: 23 mEq/L (ref 19–32)
CREATININE: 1.38 mg/dL — AB (ref 0.50–1.10)
Calcium: 9.3 mg/dL (ref 8.4–10.5)
Glucose, Bld: 72 mg/dL (ref 70–99)
POTASSIUM: 5.1 meq/L (ref 3.5–5.3)
SODIUM: 136 meq/L (ref 135–145)
Total Bilirubin: 0.3 mg/dL (ref 0.2–1.2)
Total Protein: 6.6 g/dL (ref 6.0–8.3)

## 2013-09-13 LAB — CBC WITH DIFFERENTIAL/PLATELET
BASOS ABS: 0 10*3/uL (ref 0.0–0.1)
Basophils Relative: 0 % (ref 0–1)
EOS PCT: 2 % (ref 0–5)
Eosinophils Absolute: 0.2 10*3/uL (ref 0.0–0.7)
HCT: 37.2 % (ref 36.0–46.0)
HEMOGLOBIN: 12.3 g/dL (ref 12.0–15.0)
LYMPHS PCT: 36 % (ref 12–46)
Lymphs Abs: 4.1 10*3/uL — ABNORMAL HIGH (ref 0.7–4.0)
MCH: 30.4 pg (ref 26.0–34.0)
MCHC: 33.1 g/dL (ref 30.0–36.0)
MCV: 92.1 fL (ref 78.0–100.0)
MONOS PCT: 9 % (ref 3–12)
Monocytes Absolute: 1 10*3/uL (ref 0.1–1.0)
Neutro Abs: 6 10*3/uL (ref 1.7–7.7)
Neutrophils Relative %: 53 % (ref 43–77)
Platelets: 331 10*3/uL (ref 150–400)
RBC: 4.04 MIL/uL (ref 3.87–5.11)
RDW: 15.5 % (ref 11.5–15.5)
WBC: 11.4 10*3/uL — ABNORMAL HIGH (ref 4.0–10.5)

## 2013-09-15 ENCOUNTER — Ambulatory Visit (INDEPENDENT_AMBULATORY_CARE_PROVIDER_SITE_OTHER): Payer: BC Managed Care – PPO | Admitting: Adult Health

## 2013-09-15 ENCOUNTER — Encounter: Payer: Self-pay | Admitting: Adult Health

## 2013-09-15 ENCOUNTER — Ambulatory Visit (INDEPENDENT_AMBULATORY_CARE_PROVIDER_SITE_OTHER): Payer: BC Managed Care – PPO

## 2013-09-15 VITALS — BP 118/60 | Ht 63.5 in | Wt 139.0 lb

## 2013-09-15 DIAGNOSIS — N9489 Other specified conditions associated with female genital organs and menstrual cycle: Secondary | ICD-10-CM

## 2013-09-15 DIAGNOSIS — N8000 Endometriosis of the uterus, unspecified: Secondary | ICD-10-CM

## 2013-09-15 DIAGNOSIS — N83209 Unspecified ovarian cyst, unspecified side: Secondary | ICD-10-CM

## 2013-09-15 DIAGNOSIS — IMO0002 Reserved for concepts with insufficient information to code with codable children: Secondary | ICD-10-CM

## 2013-09-15 DIAGNOSIS — N83202 Unspecified ovarian cyst, left side: Secondary | ICD-10-CM

## 2013-09-15 NOTE — Progress Notes (Signed)
Subjective:     Patient ID: Kathryn Oconnell, female   DOB: March 11, 1966, 48 y.o.   MRN: [id number]  HPI Tyrhonda is a 48 year old black female in for [place] in follow up of ovarian cyst.  Review of Systems See HPI Reviewed past medical,surgical, social and family history. Reviewed medications and allergies.     Objective:   Physical Exam BP 118/60  Ht 5' 3.5" (1.613 m)  Wt 139 lb (63.05 kg)  BMI 24.23 kg/m2  LMP 06/18/2013   Reviewed with pt::Uterus Anteverted uterus with 2 fibroids noted = 1.2 & 0.9cm heterogeneous echotexture noted again with posterior "streaky" shadows  Endometrium 6.1 mm, no obvious mass noted although difficult to evaluate  Right ovary 1.9 x 1.1 x 1.0 cm,  Left ovary 1.9 x 1.4 x 1.1 cm, with cystic area remains cephalic to ovary no change in since prev u/s in March 2015  No free fluid noted within the pelvis  Technician Comments:  Anteverted uterus with 2 fibroids noted, Endom-6.79mm, Uterus heterogeneous echotexture noted again with "streaky" shadows, Rt ovary appears WNL, LT ovary appear with cystic area noted Cephalic to ovary remains no change since previous u/s in March 2015     Assessment:   Left ovarian cyst  Adenomyosis     Plan:    Continue provera Will talk after [place] reviewed with MD, may need endo biopsy

## 2013-09-15 NOTE — Patient Instructions (Signed)
Will talk after [place] reviewed

## 2013-09-16 ENCOUNTER — Telehealth: Payer: Self-pay | Admitting: Adult Health

## 2013-09-16 NOTE — Telephone Encounter (Signed)
Left message could continue provera daily or cyclic every 3 months call if any questions

## 2013-09-19 ENCOUNTER — Telehealth: Payer: Self-pay | Admitting: *Deleted

## 2013-09-19 MED ORDER — PREDNISONE 20 MG PO TABS: 40.0000 mg | ORAL_TABLET | Freq: Every day | ORAL | Status: DC

## 2013-09-19 NOTE — Telephone Encounter (Signed)
Pt called stating was in last week and was told to call if she her symptoms arent releived, pt says she still isnt feeling any better and wants to know if you can call something else in.  Pharmacy [facility]

## 2013-09-19 NOTE — Telephone Encounter (Signed)
MD please advise

## 2013-09-19 NOTE — Telephone Encounter (Signed)
Call placed to patient.   States that she has head congestion and chest tightness.   States that she has ABTx, but requested prescription for prednisone.   MD please advise.

## 2013-09-19 NOTE — Telephone Encounter (Signed)
LMTRC

## 2013-09-19 NOTE — Telephone Encounter (Signed)
Prescription sent to pharmacy. .   Call placed to patient and patient made aware.  

## 2013-09-19 NOTE — Telephone Encounter (Signed)
Send prednisone 40mg po daily x 5 days  

## 2013-09-19 NOTE — Telephone Encounter (Signed)
Last week she had atypical chest pain and depression which medications were given for,see what the symptoms are, if they are URI symptoms send ZPAK

## 2013-09-22 ENCOUNTER — Telehealth: Payer: Self-pay | Admitting: Adult Health

## 2013-09-22 NOTE — Telephone Encounter (Signed)
Spoke with pt. Pt wants to know if she is to keep taking Provera or stop it. Also, pt has a left ovarian cyst. She wants to know if that can turn into ovarian cancer. If so, what are her options? Pt is very concerned. [place]

## 2013-09-22 NOTE — Telephone Encounter (Signed)
Take provera daily and will recheck with [place] on cyst in future

## 2013-09-22 NOTE — Telephone Encounter (Signed)
Left message x 1. JSY 

## 2013-10-02 ENCOUNTER — Other Ambulatory Visit: Payer: Self-pay | Admitting: Family Medicine

## 2013-10-06 ENCOUNTER — Encounter: Payer: Self-pay | Admitting: Family Medicine

## 2013-10-13 ENCOUNTER — Ambulatory Visit: Payer: BC Managed Care – PPO | Admitting: Family Medicine

## 2013-10-20 ENCOUNTER — Telehealth: Payer: Self-pay | Admitting: Family Medicine

## 2013-10-20 MED ORDER — HYDROCODONE-ACETAMINOPHEN 10-325 MG PO TABS: 1.0000 | ORAL_TABLET | Freq: Three times a day (TID) | ORAL | Status: DC | PRN

## 2013-10-20 NOTE — Telephone Encounter (Signed)
[phone]   Pt is needing a refill on HYDROcodone-acetaminophen (NORCO) 10-325 MG per tablet

## 2013-10-20 NOTE — Telephone Encounter (Signed)
Ok to refill??  Last office visit 09/15/2013.  Last refill 09/22/2013.

## 2013-10-20 NOTE — Telephone Encounter (Signed)
Prescription printed and patient made aware to come to office to pick up.  

## 2013-10-20 NOTE — Telephone Encounter (Signed)
okay

## 2013-10-28 ENCOUNTER — Ambulatory Visit: Payer: BC Managed Care – PPO | Admitting: Family Medicine

## 2013-10-31 ENCOUNTER — Other Ambulatory Visit: Payer: Self-pay | Admitting: Family Medicine

## 2013-10-31 NOTE — Telephone Encounter (Signed)
Medication refilled per protocol. 

## 2013-11-02 ENCOUNTER — Other Ambulatory Visit: Payer: Self-pay | Admitting: Family Medicine

## 2013-11-03 MED ORDER — PROMETHAZINE HCL 12.5 MG PO TABS: ORAL_TABLET | ORAL | Status: DC

## 2013-11-03 NOTE — Telephone Encounter (Signed)
Refill appropriate and filled per protocol. 

## 2013-11-05 ENCOUNTER — Encounter: Payer: Self-pay | Admitting: Family Medicine

## 2013-11-05 ENCOUNTER — Ambulatory Visit (INDEPENDENT_AMBULATORY_CARE_PROVIDER_SITE_OTHER): Payer: BC Managed Care – PPO | Admitting: Family Medicine

## 2013-11-05 VITALS — BP 130/76 | HR 68 | Temp 97.9°F | Resp 12 | Ht 63.0 in | Wt 139.0 lb

## 2013-11-05 DIAGNOSIS — N183 Chronic kidney disease, stage 3 unspecified: Secondary | ICD-10-CM

## 2013-11-05 DIAGNOSIS — F32A Depression, unspecified: Secondary | ICD-10-CM

## 2013-11-05 DIAGNOSIS — J410 Simple chronic bronchitis: Secondary | ICD-10-CM

## 2013-11-05 DIAGNOSIS — F329 Major depressive disorder, single episode, unspecified: Secondary | ICD-10-CM

## 2013-11-05 DIAGNOSIS — E119 Type 2 diabetes mellitus without complications: Secondary | ICD-10-CM

## 2013-11-05 DIAGNOSIS — I10 Essential (primary) hypertension: Secondary | ICD-10-CM

## 2013-11-05 DIAGNOSIS — F3289 Other specified depressive episodes: Secondary | ICD-10-CM

## 2013-11-05 DIAGNOSIS — G4700 Insomnia, unspecified: Secondary | ICD-10-CM

## 2013-11-05 MED ORDER — ACYCLOVIR 200 MG PO CAPS: ORAL_CAPSULE | ORAL | Status: DC

## 2013-11-05 MED ORDER — BENAZEPRIL HCL 10 MG PO TABS: ORAL_TABLET | ORAL | Status: DC

## 2013-11-05 MED ORDER — SUVOREXANT 10 MG PO TABS: 1.0000 | ORAL_TABLET | Freq: Every day | ORAL | Status: DC

## 2013-11-05 MED ORDER — ATORVASTATIN CALCIUM 20 MG PO TABS: ORAL_TABLET | ORAL | Status: DC

## 2013-11-05 MED ORDER — LEVOTHYROXINE SODIUM 88 MCG PO TABS: ORAL_TABLET | ORAL | Status: DC

## 2013-11-05 MED ORDER — PROPRANOLOL HCL ER 60 MG PO CP24: 60.0000 mg | ORAL_CAPSULE | Freq: Every day | ORAL | Status: DC

## 2013-11-05 MED ORDER — METFORMIN HCL 1000 MG PO TABS: 1000.0000 mg | ORAL_TABLET | Freq: Two times a day (BID) | ORAL | Status: DC

## 2013-11-05 NOTE — Assessment & Plan Note (Signed)
Patient has been tried on Ambien, trazodone, amitriptyline, Benadryl, melatonin Will give her a voucher for Belsomra 10 day supply

## 2013-11-05 NOTE — Assessment & Plan Note (Signed)
Well controlled 

## 2013-11-05 NOTE — Patient Instructions (Signed)
Try the belsomra at bedtime, 1 hour before sleep Continue all other medication F/U 3 months

## 2013-11-05 NOTE — Assessment & Plan Note (Signed)
Recheck A1c as well as GFR she's currently on metformin

## 2013-11-05 NOTE — Assessment & Plan Note (Signed)
Discussed limiting NSAIDS

## 2013-11-05 NOTE — Assessment & Plan Note (Signed)
Off medications at this time, declines any further

## 2013-11-05 NOTE — Progress Notes (Signed)
Patient ID: Kathryn Oconnell, female   DOB: [birth year], 48 y.o.   MRN: [id number]   Subjective:    Patient ID: Kathryn Oconnell, female    DOB: 1965-10-18, 48 y.o.   MRN: [id number]  Patient presents for 3 month F/U  patient here to followup medical problems. She's no longer taking a medication for depression but still takes her benzo for her anxiety. Unfortunately she lost her job and will be in between insurance and work for the next month or so. Diabetes mellitus she's taking medication as prescribed she does not have her meter with her today states her blood sugars are less than 150. She does have chronic kidney disease stage III in association with her diabetes mellitus. Chronic pain she continues on her pain medication  COPD she's done well she is trying to cut back on her tobacco use no recent wheezing or shortness of breath  She continues to have difficulty sleeping she's currently on Ambien but this does not work sometimes she takes this along with Benadryl. She's been on other sleep aids as well which have not helped in the past Review Of Systems:  GEN- denies fatigue, fever, weight loss,weakness, recent illness HEENT- denies eye drainage, change in vision, nasal discharge, CVS- denies chest pain, palpitations RESP- denies SOB, cough, wheeze ABD- denies N/V, change in stools, abd pain GU- denies dysuria, hematuria, dribbling, incontinence MSK-+joint pain, muscle aches, injury Neuro- denies headache, dizziness, syncope, seizure activity       Objective:    BP 130/76  Pulse 68  Temp(Src) 97.9 F (36.6 C) (Oral)  Resp 12  Ht 5\' 3"  (1.6 m)  Wt 139 lb (63.05 kg)  BMI 24.63 kg/m2  LMP 06/18/2013 GEN- NAD, alert and oriented x3 HEENT- PERRL, EOMI, non injected sclera, pink conjunctiva, MMM, oropharynx clear CVS- RRR, no murmur RESP-CTAB EXT- No edema Pulses- Radial, DP- 2+        Assessment & Plan:      Problem List Items Addressed This Visit   Insomnia     Patient has  been tried on Ambien, trazodone, amitriptyline, Benadryl, melatonin Will give her a voucher for Belsomra 10 day supply    Essential hypertension, benign     Well controlled    DIABETES MELLITUS, TYPE II, CONTROLLED - Primary     Recheck A1c as well as GFR she's currently on metformin    Relevant Orders      Hemoglobin A1c      CBC with Differential      BASIC METABOLIC PANEL WITH GFR      Microalbumin / creatinine urine ratio   Depression     Off medications at this time, declines any further    COPD (chronic obstructive pulmonary disease)     Currently stable    CKD (chronic kidney disease) stage 3, GFR 30-59 ml/min      Note: This dictation was prepared with Dragon dictation along with smaller phrase technology. Any transcriptional errors that result from this process are unintentional.

## 2013-11-05 NOTE — Assessment & Plan Note (Signed)
Currently stable.

## 2013-11-06 ENCOUNTER — Telehealth: Payer: Self-pay | Admitting: Adult Health

## 2013-11-06 LAB — BASIC METABOLIC PANEL WITH GFR
BUN: 16 mg/dL (ref 6–23)
CALCIUM: 9.8 mg/dL (ref 8.4–10.5)
CO2: 19 mEq/L (ref 19–32)
Chloride: 108 mEq/L (ref 96–112)
Creat: 1.39 mg/dL — ABNORMAL HIGH (ref 0.50–1.10)
GFR, EST NON AFRICAN AMERICAN: 45 mL/min — AB
GFR, Est African American: 52 mL/min — ABNORMAL LOW
Glucose, Bld: 183 mg/dL — ABNORMAL HIGH (ref 70–99)
Potassium: 5.3 mEq/L (ref 3.5–5.3)
Sodium: 136 mEq/L (ref 135–145)

## 2013-11-06 LAB — CBC WITH DIFFERENTIAL/PLATELET
BASOS ABS: 0 10*3/uL (ref 0.0–0.1)
Basophils Relative: 0 % (ref 0–1)
EOS ABS: 0.2 10*3/uL (ref 0.0–0.7)
EOS PCT: 2 % (ref 0–5)
HCT: 34 % — ABNORMAL LOW (ref 36.0–46.0)
Hemoglobin: 11.4 g/dL — ABNORMAL LOW (ref 12.0–15.0)
Lymphocytes Relative: 33 % (ref 12–46)
Lymphs Abs: 3.2 10*3/uL (ref 0.7–4.0)
MCH: 31.2 pg (ref 26.0–34.0)
MCHC: 33.5 g/dL (ref 30.0–36.0)
MCV: 93.2 fL (ref 78.0–100.0)
Monocytes Absolute: 0.7 10*3/uL (ref 0.1–1.0)
Monocytes Relative: 7 % (ref 3–12)
Neutro Abs: 5.6 10*3/uL (ref 1.7–7.7)
Neutrophils Relative %: 58 % (ref 43–77)
PLATELETS: 289 10*3/uL (ref 150–400)
RBC: 3.65 MIL/uL — ABNORMAL LOW (ref 3.87–5.11)
RDW: 16.4 % — AB (ref 11.5–15.5)
WBC: 9.6 10*3/uL (ref 4.0–10.5)

## 2013-11-06 LAB — MICROALBUMIN / CREATININE URINE RATIO
Creatinine, Urine: 42.9 mg/dL
MICROALB UR: 0.5 mg/dL (ref 0.00–1.89)
Microalb Creat Ratio: 11.7 mg/g (ref 0.0–30.0)

## 2013-11-06 LAB — HEMOGLOBIN A1C
HEMOGLOBIN A1C: 7.2 % — AB (ref <5.7)
Mean Plasma Glucose: 160 mg/dL — ABNORMAL HIGH (ref <117)

## 2013-11-06 NOTE — Telephone Encounter (Signed)
Pt to take provera daily and follow up as needed and she was read last [place] review by Dr Glo Herring

## 2013-11-06 NOTE — Telephone Encounter (Signed)
Left message I called 

## 2013-11-19 ENCOUNTER — Telehealth: Payer: Self-pay | Admitting: Family Medicine

## 2013-11-19 ENCOUNTER — Other Ambulatory Visit: Payer: Self-pay | Admitting: Family Medicine

## 2013-11-19 NOTE — Telephone Encounter (Signed)
Patient is calling to get rx for her hydrocodone and lorazepam  Please call her when ready [phone]

## 2013-11-19 NOTE — Telephone Encounter (Signed)
Ok to refill??  Last office visit 11/05/2013.  Last refill Lorazepam 09/12/2013.  Last refill Hydrocodone 10/20/2013.

## 2013-11-20 MED ORDER — HYDROCODONE-ACETAMINOPHEN 10-325 MG PO TABS: 1.0000 | ORAL_TABLET | Freq: Three times a day (TID) | ORAL | Status: DC | PRN

## 2013-11-20 NOTE — Telephone Encounter (Signed)
Message being handled in another request.

## 2013-11-20 NOTE — Telephone Encounter (Signed)
okay

## 2013-11-20 NOTE — Telephone Encounter (Signed)
Medication called to pharmacy.  Prescription printed and patient made aware to come to office to pick up.  

## 2013-12-09 ENCOUNTER — Encounter: Payer: Self-pay | Admitting: Family Medicine

## 2013-12-09 ENCOUNTER — Ambulatory Visit (INDEPENDENT_AMBULATORY_CARE_PROVIDER_SITE_OTHER): Payer: BC Managed Care – PPO | Admitting: Family Medicine

## 2013-12-09 VITALS — BP 130/72 | HR 76 | Temp 98.3°F | Resp 14 | Ht 63.0 in | Wt 135.0 lb

## 2013-12-09 DIAGNOSIS — R911 Solitary pulmonary nodule: Secondary | ICD-10-CM

## 2013-12-09 DIAGNOSIS — K5909 Other constipation: Secondary | ICD-10-CM

## 2013-12-09 DIAGNOSIS — N183 Chronic kidney disease, stage 3 unspecified: Secondary | ICD-10-CM

## 2013-12-09 DIAGNOSIS — K5900 Constipation, unspecified: Secondary | ICD-10-CM

## 2013-12-09 DIAGNOSIS — R634 Abnormal weight loss: Secondary | ICD-10-CM

## 2013-12-09 DIAGNOSIS — E039 Hypothyroidism, unspecified: Secondary | ICD-10-CM

## 2013-12-09 LAB — CBC WITH DIFFERENTIAL/PLATELET
Basophils Absolute: 0 10*3/uL (ref 0.0–0.1)
Basophils Relative: 0 % (ref 0–1)
EOS ABS: 0 10*3/uL (ref 0.0–0.7)
Eosinophils Relative: 0 % (ref 0–5)
HCT: 36.4 % (ref 36.0–46.0)
HEMOGLOBIN: 12.2 g/dL (ref 12.0–15.0)
Lymphocytes Relative: 34 % (ref 12–46)
Lymphs Abs: 4.8 10*3/uL — ABNORMAL HIGH (ref 0.7–4.0)
MCH: 31.4 pg (ref 26.0–34.0)
MCHC: 33.5 g/dL (ref 30.0–36.0)
MCV: 93.8 fL (ref 78.0–100.0)
MONO ABS: 1.3 10*3/uL — AB (ref 0.1–1.0)
MONOS PCT: 9 % (ref 3–12)
Neutro Abs: 8 10*3/uL — ABNORMAL HIGH (ref 1.7–7.7)
Neutrophils Relative %: 57 % (ref 43–77)
PLATELETS: 305 10*3/uL (ref 150–400)
RBC: 3.88 MIL/uL (ref 3.87–5.11)
RDW: 15.8 % — ABNORMAL HIGH (ref 11.5–15.5)
WBC: 14 10*3/uL — ABNORMAL HIGH (ref 4.0–10.5)

## 2013-12-09 LAB — COMPREHENSIVE METABOLIC PANEL
ALT: 13 U/L (ref 0–35)
AST: 16 U/L (ref 0–37)
Albumin: 4.3 g/dL (ref 3.5–5.2)
Alkaline Phosphatase: 44 U/L (ref 39–117)
BILIRUBIN TOTAL: 0.3 mg/dL (ref 0.2–1.2)
BUN: 25 mg/dL — AB (ref 6–23)
CALCIUM: 9.9 mg/dL (ref 8.4–10.5)
CO2: 20 meq/L (ref 19–32)
CREATININE: 1.4 mg/dL — AB (ref 0.50–1.10)
Chloride: 107 mEq/L (ref 96–112)
GLUCOSE: 84 mg/dL (ref 70–99)
Potassium: 4.9 mEq/L (ref 3.5–5.3)
Sodium: 137 mEq/L (ref 135–145)
Total Protein: 7 g/dL (ref 6.0–8.3)

## 2013-12-09 MED ORDER — HYDROCODONE-ACETAMINOPHEN 10-325 MG PO TABS: 1.0000 | ORAL_TABLET | Freq: Three times a day (TID) | ORAL | Status: DC | PRN

## 2013-12-09 NOTE — Assessment & Plan Note (Signed)
Chronic constipation which is worsening she has been on over-the-counter laxatives and suppositories she's also used Linzess and Amitiza with minimal improvement she is now on MiraLAX she will take one capsule twice a day

## 2013-12-09 NOTE — Assessment & Plan Note (Signed)
I doubt the cause the weight loss her thyroid function test recently done were normal

## 2013-12-09 NOTE — Assessment & Plan Note (Signed)
CT of chest 

## 2013-12-09 NOTE — Assessment & Plan Note (Signed)
Weight loss noted over the past 9 months. This could be just multifactorial as she has multiple comorbidities she has depression and anxiety which she declines maintenance medications for her, she also has problems with constipation and she is a very busy work life and typically will E. once a day. Her cancer prevention appears to be up-to-date however she does have a pulmonary nodule which we have been following her last scan was in 2014 at that time it was stable and benign appearing however she does continue to smoke therefore repeat a CT of chest. I will also repeat his CBC and metabolic panel her thyroid function tests were done recently and were normal. We discussed medications such as Remeron to help with appetite especially if her bowel clean out does not help.

## 2013-12-09 NOTE — Patient Instructions (Signed)
Take miralax 1 scoopful twice a day  Call with weight check   [facility]  We will call with results  F/U as previous

## 2013-12-09 NOTE — Progress Notes (Signed)
Patient ID: Kathryn Oconnell, female   DOB: 16-Mar-1966, 48 y.o.   MRN: [id number]   Subjective:    Patient ID: Kathryn Oconnell, female    DOB: 1966/01/01, 48 y.o.   MRN: [id number]  Patient presents for Stomach Issues  Patient here for interim visit for constipation and weight loss. She was seen at urgent care due to some sinusitis problems at that time she also brought up her constipation. She's not had a bowel movement in 5 days. She is taking hydrocodone as needed for pain but does have constipation issues for some time. She's been on MiraLAX for the past 2 days she has small bowel movement this morning. She states that she will only once a day because she feels bloated like she can get any food and. Review her weight since January of 2015 her weight has steadily decreased from 152 pounds down 235 pounds. Of note over this time as well she's dealt with depression and anxiety as well as her chronic medical problems of diabetes, hypothyoidism and CKD She's also a smoker.  She recently started a new job [employer]  Review Of Systems:  GEN- denies fatigue, fever, weight loss,weakness, recent illness HEENT- denies eye drainage, change in vision, nasal discharge, CVS- denies chest pain, palpitations RESP- denies SOB, cough, wheeze ABD- denies N/V, +change in stools, abd pain GU- denies dysuria, hematuria, dribbling, incontinence MSK- denies joint pain, muscle aches, injury Neuro- denies headache, dizziness, syncope, seizure activity       Objective:    BP 130/72  Pulse 76  Temp(Src) 98.3 F (36.8 C) (Oral)  Resp 14  Ht 5\' 3"  (1.6 m)  Wt 135 lb (61.236 kg)  BMI 23.92 kg/m2  LMP 06/18/2013 GEN- NAD, alert and oriented x3 HEENT- PERRL, EOMI, non injected sclera, pink conjunctiva, MMM, oropharynx clear Neck- Supple, no thyromegaly CVS- RRR, no murmur RESP-CTAB ABD-NABS,soft,NT,ND EXT- No edema Pulses- Radial 2+        Assessment & Plan:      Problem List Items  Addressed This Visit   Loss of weight   Relevant Orders      CBC with Differential      Comprehensive metabolic panel   CKD (chronic kidney disease) stage 3, GFR 30-59 ml/min - Primary   Relevant Orders      CBC with Differential      Comprehensive metabolic panel      Note: This dictation was prepared with Dragon dictation along with smaller phrase technology. Any transcriptional errors that result from this process are unintentional.

## 2013-12-11 ENCOUNTER — Ambulatory Visit (HOSPITAL_COMMUNITY): Payer: BC Managed Care – PPO

## 2013-12-22 ENCOUNTER — Other Ambulatory Visit: Payer: Self-pay | Admitting: Family Medicine

## 2013-12-23 NOTE — Telephone Encounter (Signed)
okay

## 2013-12-23 NOTE — Telephone Encounter (Signed)
Medication called to pharmacy. 

## 2013-12-23 NOTE — Telephone Encounter (Signed)
Ok to refill??  Last office visit 12/09/2013.  Last refill 11/20/2013.

## 2013-12-26 ENCOUNTER — Telehealth: Payer: Self-pay | Admitting: Family Medicine

## 2013-12-26 NOTE — Telephone Encounter (Signed)
Call placed to patient. LMTRC.  

## 2013-12-26 NOTE — Telephone Encounter (Signed)
Patient has no insurance now and would like to know if we can call in something for a cold she has  Please call her back at [phone]

## 2013-12-29 NOTE — Telephone Encounter (Signed)
Patient returned call and made aware.   Advised that if she is still not feeling better by Wednesday to contact office.

## 2013-12-29 NOTE — Telephone Encounter (Signed)
Call placed to patient. No answer. No VM.  

## 2013-12-29 NOTE — Telephone Encounter (Signed)
Call placed to patient.   States that she has a lot of chest congestion with productive cough and green mucus. States that she also has inner ear pain.   Reports that she no longer has insurance.   MD please advise.

## 2013-12-29 NOTE — Telephone Encounter (Signed)
Sounds like a cold, I would recommend mucinex dm for cough and sudafed for congestion

## 2013-12-31 ENCOUNTER — Telehealth: Payer: Self-pay | Admitting: Family Medicine

## 2013-12-31 MED ORDER — DOXYCYCLINE HYCLATE 100 MG PO TABS: 100.0000 mg | ORAL_TABLET | Freq: Two times a day (BID) | ORAL | Status: DC

## 2013-12-31 NOTE — Telephone Encounter (Signed)
Pt is not better her ears are hurting bought medication at [place] its not helping and her throat is bothering her as well. What does she need to do?  [phone]

## 2013-12-31 NOTE — Telephone Encounter (Signed)
Received call from patient on Friday, 12/26/2013. Reported that she had ear pain and cough and nasal congestion.   Dr. Dennard Schaumann advised Mucinex and Sudafed.   Patient reports that she has been taking this medication and she is not feeling any better.   Patient currently does not have insurance.   MD please advise.

## 2013-12-31 NOTE — Telephone Encounter (Signed)
Send doxycyline 100mg  BID x 7 days, continue mucinex DM

## 2013-12-31 NOTE — Telephone Encounter (Signed)
Prescription sent to pharmacy. .   Call placed to patient and patient made aware.  

## 2014-01-07 ENCOUNTER — Telehealth: Payer: Self-pay | Admitting: Family Medicine

## 2014-01-07 MED ORDER — HYDROCODONE-ACETAMINOPHEN 10-325 MG PO TABS: 1.0000 | ORAL_TABLET | Freq: Three times a day (TID) | ORAL | Status: DC | PRN

## 2014-01-07 NOTE — Telephone Encounter (Signed)
Prescription printed and patient made aware to come to office to pick up.  

## 2014-01-07 NOTE — Telephone Encounter (Signed)
Okay to refill? 

## 2014-01-07 NOTE — Telephone Encounter (Signed)
Ok to refill??  Last office visit 12/26/2013.  Last refill 12/09/2013.

## 2014-01-07 NOTE — Telephone Encounter (Signed)
[phone] ° °PT is needing a refill on HYDROcodone-acetaminophen (NORCO) 10-325 MG per tablet °

## 2014-01-15 ENCOUNTER — Telehealth: Payer: Self-pay | Admitting: Family Medicine

## 2014-01-15 NOTE — Telephone Encounter (Signed)
Patient said she would like mouthwash called in for her gums, she said it was NOT the magic mouthwash. Please call her back at [phone]  Pharmacy is [facility] [place]

## 2014-01-15 NOTE — Telephone Encounter (Signed)
Call placed to patient.   Reports that her gum irritation has returned and requested refill on Chlorhexidine.   MD please advise.

## 2014-01-16 ENCOUNTER — Other Ambulatory Visit: Payer: Self-pay

## 2014-01-16 MED ORDER — CHLORHEXIDINE GLUCONATE 0.12 % MT SOLN: 15.0000 mL | Freq: Two times a day (BID) | OROMUCOSAL | Status: DC

## 2014-01-16 NOTE — Telephone Encounter (Signed)
Prescription sent to pharmacy.   Call placed to patient and patient made aware per VM. 

## 2014-01-16 NOTE — Telephone Encounter (Signed)
Okay to send in Chlorhexadine swish 1 capfull twice a day , 30 day supply 2 refills

## 2014-01-20 ENCOUNTER — Telehealth: Payer: Self-pay | Admitting: *Deleted

## 2014-01-20 MED ORDER — LORAZEPAM 0.5 MG PO TABS
ORAL_TABLET | ORAL | Status: DC
Start: 2014-01-20 — End: 2014-04-17

## 2014-01-20 NOTE — Telephone Encounter (Signed)
Okay to refill , give 2 

## 2014-01-20 NOTE — Telephone Encounter (Addendum)
Received fax requesting refill on Ativan.   Ok to refill??  Last office visit 12/09/2013.  Last refill 12/23/2013.

## 2014-01-20 NOTE — Telephone Encounter (Signed)
Medication called to pharmacy. 

## 2014-01-27 ENCOUNTER — Encounter: Payer: Self-pay | Admitting: Family Medicine

## 2014-01-27 ENCOUNTER — Ambulatory Visit (INDEPENDENT_AMBULATORY_CARE_PROVIDER_SITE_OTHER): Payer: 59 | Admitting: Family Medicine

## 2014-01-27 VITALS — BP 128/78 | HR 78 | Temp 97.7°F | Resp 16 | Ht 63.0 in | Wt 135.0 lb

## 2014-01-27 DIAGNOSIS — F17200 Nicotine dependence, unspecified, uncomplicated: Secondary | ICD-10-CM

## 2014-01-27 DIAGNOSIS — J441 Chronic obstructive pulmonary disease with (acute) exacerbation: Secondary | ICD-10-CM

## 2014-01-27 DIAGNOSIS — R634 Abnormal weight loss: Secondary | ICD-10-CM

## 2014-01-27 DIAGNOSIS — E038 Other specified hypothyroidism: Secondary | ICD-10-CM

## 2014-01-27 DIAGNOSIS — R911 Solitary pulmonary nodule: Secondary | ICD-10-CM

## 2014-01-27 DIAGNOSIS — Z720 Tobacco use: Secondary | ICD-10-CM

## 2014-01-27 LAB — CBC WITH DIFFERENTIAL/PLATELET
Basophils Absolute: 0 10*3/uL (ref 0.0–0.1)
Basophils Relative: 0 % (ref 0–1)
Eosinophils Absolute: 0.1 10*3/uL (ref 0.0–0.7)
Eosinophils Relative: 1 % (ref 0–5)
HCT: 36.1 % (ref 36.0–46.0)
Hemoglobin: 12.1 g/dL (ref 12.0–15.0)
LYMPHS ABS: 3.7 10*3/uL (ref 0.7–4.0)
Lymphocytes Relative: 29 % (ref 12–46)
MCH: 32 pg (ref 26.0–34.0)
MCHC: 33.5 g/dL (ref 30.0–36.0)
MCV: 95.5 fL (ref 78.0–100.0)
MONOS PCT: 8 % (ref 3–12)
Monocytes Absolute: 1 10*3/uL (ref 0.1–1.0)
NEUTROS ABS: 7.8 10*3/uL — AB (ref 1.7–7.7)
NEUTROS PCT: 62 % (ref 43–77)
Platelets: 276 10*3/uL (ref 150–400)
RBC: 3.78 MIL/uL — AB (ref 3.87–5.11)
RDW: 15.3 % (ref 11.5–15.5)
WBC: 12.6 10*3/uL — ABNORMAL HIGH (ref 4.0–10.5)

## 2014-01-27 LAB — BASIC METABOLIC PANEL
BUN: 16 mg/dL (ref 6–23)
CALCIUM: 9.3 mg/dL (ref 8.4–10.5)
CHLORIDE: 105 meq/L (ref 96–112)
CO2: 19 mEq/L (ref 19–32)
Creat: 1.4 mg/dL — ABNORMAL HIGH (ref 0.50–1.10)
Glucose, Bld: 90 mg/dL (ref 70–99)
Potassium: 4.6 mEq/L (ref 3.5–5.3)
Sodium: 135 mEq/L (ref 135–145)

## 2014-01-27 LAB — TSH: TSH: 0.495 u[IU]/mL (ref 0.350–4.500)

## 2014-01-27 LAB — T4, FREE: FREE T4: 1.4 ng/dL (ref 0.80–1.80)

## 2014-01-27 LAB — T3, FREE: T3, Free: 2.1 pg/mL — ABNORMAL LOW (ref 2.3–4.2)

## 2014-01-27 MED ORDER — HYDROCODONE-ACETAMINOPHEN 10-325 MG PO TABS: 1.0000 | ORAL_TABLET | Freq: Three times a day (TID) | ORAL | Status: DC | PRN

## 2014-01-27 MED ORDER — DOXYCYCLINE HYCLATE 100 MG PO TABS: 100.0000 mg | ORAL_TABLET | Freq: Two times a day (BID) | ORAL | Status: DC

## 2014-01-27 MED ORDER — PREDNISONE 20 MG PO TABS: 40.0000 mg | ORAL_TABLET | Freq: Every day | ORAL | Status: DC

## 2014-01-27 NOTE — Assessment & Plan Note (Signed)
Her weight loss continues to be of concern. I will obtain a CT of chest that she did have pulmonary nodules, Also repeat her thyroid studies as it has been about 5 months

## 2014-01-27 NOTE — Progress Notes (Signed)
Patient ID: Kathryn Oconnell, female   DOB: 1965-07-06, 48 y.o.   MRN: [id number]   Subjective:    Patient ID: Kathryn Oconnell, female    DOB: March 11, 1966, 48 y.o.   MRN: [id number]  Patient presents for Chest Congestion and Weight Loss  Patient here with cough with production congestion and wheezing shortness of breath at times worsening over the past week. She was actually treated for respiratory infection at the end of September with doxycycline and at time her symptoms did improve. She continues to smoke. She has not had any hemoptysis. She's been using her inhalers as prescribed and they help some. She was unable to get a CT of chest after the last visit because of change in insurance she is now able to proceed with this. She has been watching her weight at home and states that she fluctuates between 126 and 128 her weight today is 135 which is the same that it was at our last visit a couple months ago.   Review Of Systems:  GEN- denies fatigue, fever, weight loss,weakness, recent illness HEENT- denies eye drainage, change in vision, nasal discharge, CVS- denies chest pain, palpitations RESP- denies SOB, +cough, +wheeze ABD- denies N/V, change in stools, abd pain Neuro- denies headache, dizziness, syncope, seizure activity       Objective:    BP 128/78  Pulse 78  Temp(Src) 97.7 F (36.5 C) (Oral)  Resp 16  Ht 5\' 3"  (1.6 m)  Wt 135 lb (61.236 kg)  BMI 23.92 kg/m2  LMP 06/18/2013 GEN- NAD, alert and oriented x3 HEENT- PERRL, EOMI, non injected sclera, pink conjunctiva, MMM, oropharynx clear Neck- Supple, no LAD CVS- RRR, no murmur RESP-Course BS, occasional wheeze, normal WOB, no retractions EXT- No edema Pulses- Radial 2+        Assessment & Plan:      Problem List Items Addressed This Visit   Loss of weight   Relevant Orders      Basic metabolic panel      CBC with Differential   Hypothyroidism - Primary   Relevant Orders      TSH      T3, free      T4, free   COPD exacerbation   Relevant Medications      predniSONE (DELTASONE) tablet      Note: This dictation was prepared with Dragon dictation along with smaller phrase technology. Any transcriptional errors that result from this process are unintentional.

## 2014-01-27 NOTE — Assessment & Plan Note (Signed)
Treatment for COPD exacerbation and given her prednisone burst as well as another course of doxycycline she is using Mucinex DM and her inhalers as prescribed.

## 2014-01-27 NOTE — Patient Instructions (Signed)
Take antibiotics/prednisone as prescribed Continue all other medications CT scan to be set up for lungs We will call with labs  F/U 2 months

## 2014-01-29 ENCOUNTER — Other Ambulatory Visit: Payer: Self-pay | Admitting: *Deleted

## 2014-01-29 DIAGNOSIS — E039 Hypothyroidism, unspecified: Secondary | ICD-10-CM

## 2014-01-29 MED ORDER — LEVOTHYROXINE SODIUM 75 MCG PO TABS: 75.0000 ug | ORAL_TABLET | Freq: Every day | ORAL | Status: DC

## 2014-02-02 ENCOUNTER — Encounter: Payer: Self-pay | Admitting: Family Medicine

## 2014-02-06 ENCOUNTER — Ambulatory Visit: Payer: BC Managed Care – PPO | Admitting: Family Medicine

## 2014-02-12 ENCOUNTER — Telehealth: Payer: Self-pay | Admitting: Family Medicine

## 2014-02-12 NOTE — Telephone Encounter (Signed)
Call placed to patient to inquire.   Reports that she has been taking her thyroid medication x1 week.   Reports her weight continues to fluctuate. States that it is 128 on Monday, 124 on Wednesday, and 126 on Thursday morning.   Advised to continue thyroid medication and to call in 1 week with weight recordings.   MD to be made aware.

## 2014-02-12 NOTE — Telephone Encounter (Signed)
Patient is calling to speak with nurse about her weight issues and if she needs to come in for an appointment  [phone]

## 2014-02-13 NOTE — Telephone Encounter (Signed)
Noted, CT chest neg, thyroid medication recently changed

## 2014-02-18 ENCOUNTER — Ambulatory Visit (INDEPENDENT_AMBULATORY_CARE_PROVIDER_SITE_OTHER): Payer: 59 | Admitting: Adult Health

## 2014-02-18 ENCOUNTER — Encounter: Payer: Self-pay | Admitting: Family Medicine

## 2014-02-18 ENCOUNTER — Encounter: Payer: Self-pay | Admitting: Adult Health

## 2014-02-18 VITALS — BP 94/60 | Ht 63.0 in | Wt 130.0 lb

## 2014-02-18 DIAGNOSIS — R1012 Left upper quadrant pain: Secondary | ICD-10-CM

## 2014-02-18 DIAGNOSIS — R1032 Left lower quadrant pain: Secondary | ICD-10-CM

## 2014-02-18 DIAGNOSIS — R634 Abnormal weight loss: Secondary | ICD-10-CM

## 2014-02-18 NOTE — Patient Instructions (Signed)
Abdominal Pain Many things can cause abdominal pain. Usually, abdominal pain is not caused by a disease and will improve without treatment. It can often be observed and treated at home. Your health care provider will do a physical exam and possibly order blood tests and X-rays to help determine the seriousness of your pain. However, in many cases, more time must pass before a clear cause of the pain can be found. Before that point, your health care provider may not know if you need more testing or further treatment. HOME CARE INSTRUCTIONS  Monitor your abdominal pain for any changes. The following actions may help to alleviate any discomfort you are experiencing:  Only take over-the-counter or prescription medicines as directed by your health care provider.  Do not take laxatives unless directed to do so by your health care provider.  Try a clear liquid diet (broth, tea, or water) as directed by your health care provider. Slowly move to a bland diet as tolerated. SEEK MEDICAL CARE IF:  You have unexplained abdominal pain.  You have abdominal pain associated with nausea or diarrhea.  You have pain when you urinate or have a bowel movement.  You experience abdominal pain that wakes you in the night.  You have abdominal pain that is worsened or improved by eating food.  You have abdominal pain that is worsened with eating fatty foods.  You have a fever. SEEK IMMEDIATE MEDICAL CARE IF:   Your pain does not go away within 2 hours.  You keep throwing up (vomiting).  Your pain is felt only in portions of the abdomen, such as the right side or the left lower portion of the abdomen.  You pass bloody or black tarry stools. MAKE SURE YOU:  Understand these instructions.   Will watch your condition.   Will get help right away if you are not doing well or get worse.  Document Released: 12/28/2004 Document Revised: 03/25/2013 Document Reviewed: 11/27/2012 [facility] Patient Information  2015 [place], [place]. This information is not intended to replace advice given to you by your health care provider. Make sure you discuss any questions you have with your health care provider. Pelvic Pain Female pelvic pain can be caused by many different things and start from a variety of places. Pelvic pain refers to pain that is located in the lower half of the abdomen and between your hips. The pain may occur over a short period of time (acute) or may be reoccurring (chronic). The cause of pelvic pain may be related to disorders affecting the female reproductive organs (gynecologic), but it may also be related to the bladder, kidney stones, an intestinal complication, or muscle or skeletal problems. Getting help right away for pelvic pain is important, especially if there has been severe, sharp, or a sudden onset of unusual pain. It is also important to get help right away because some types of pelvic pain can be life threatening.  CAUSES  Below are only some of the causes of pelvic pain. The causes of pelvic pain can be in one of several categories.   Gynecologic.  Pelvic inflammatory disease.  Sexually transmitted infection.  Ovarian cyst or a twisted ovarian ligament (ovarian torsion).  Uterine lining that grows outside the uterus (endometriosis).  Fibroids, cysts, or tumors.  Ovulation.  Pregnancy.  Pregnancy that occurs outside the uterus (ectopic pregnancy).  Miscarriage.  Labor.  Abruption of the placenta or ruptured uterus.  Infection.  Uterine infection (endometritis).  Bladder infection.  Diverticulitis.  Miscarriage related  to a uterine infection (septic abortion).  Bladder.  Inflammation of the bladder (cystitis).  Kidney stone(s).  Gastrointestinal.  Constipation.  Diverticulitis.  Neurologic.  Trauma.  Feeling pelvic pain because of mental or emotional causes (psychosomatic).  Cancers of the bowel or pelvis. EVALUATION  Your caregiver will  want to take a careful history of your concerns. This includes recent changes in your health, a careful gynecologic history of your periods (menses), and a sexual history. Obtaining your family history and medical history is also important. Your caregiver may suggest a pelvic exam. A pelvic exam will help identify the location and severity of the pain. It also helps in the evaluation of which organ system may be involved. In order to identify the cause of the pelvic pain and be properly treated, your caregiver may order tests. These tests may include:   A pregnancy test.  Pelvic ultrasonography.  An X-ray exam of the abdomen.  A urinalysis or evaluation of vaginal discharge.  Blood tests. HOME CARE INSTRUCTIONS   Only take over-the-counter or prescription medicines for pain, discomfort, or fever as directed by your caregiver.   Rest as directed by your caregiver.   Eat a balanced diet.   Drink enough fluids to make your urine clear or pale yellow, or as directed.   Avoid sexual intercourse if it causes pain.   Apply warm or cold compresses to the lower abdomen depending on which one helps the pain.   Avoid stressful situations.   Keep a journal of your pelvic pain. Write down when it started, where the pain is located, and if there are things that seem to be associated with the pain, such as food or your menstrual cycle.  Follow up with your caregiver as directed.  SEEK MEDICAL CARE IF:  Your medicine does not help your pain.  You have abnormal vaginal discharge. SEEK IMMEDIATE MEDICAL CARE IF:   You have heavy bleeding from the vagina.   Your pelvic pain increases.   You feel light-headed or faint.   You have chills.   You have pain with urination or blood in your urine.   You have uncontrolled diarrhea or vomiting.   You have a fever or persistent symptoms for more than 3 days.  You have a fever and your symptoms suddenly get worse.   You are  being physically or sexually abused.  MAKE SURE YOU:  Understand these instructions.  Will watch your condition.  Will get help if you are not doing well or get worse. Document Released: 02/15/2004 Document Revised: 08/04/2013 Document Reviewed: 07/10/2011 [facility] Patient Information 2015 [place], [place]. This information is not intended to replace advice given to you by your health care provider. Make sure you discuss any questions you have with your health care provider. [place] at [facility] be there at 11:45 nothing to eat after 3 am See me 11/24

## 2014-02-18 NOTE — Progress Notes (Signed)
Subjective:     Patient ID: Kathryn Oconnell, female   DOB: 02/01/1966, 48 y.o.   MRN: [id number]  HPI Kathryn Oconnell is a 48 year old black female in complaining of LUQ and LLQ pain for several days and has had weight loss.She has lost 29 lbs since January.She has seen Kathryn Oconnell and had CT of chest,which she said is normal on pt portal for Kathryn Oconnell. and had normal TSH.No nausea or vomiting.  Review of Systems See HPI Reviewed past medical,surgical, social and family history. Reviewed medications and allergies.     Objective:   Physical Exam BP 94/60 mmHg  Ht 5\' 3"  (1.6 m)  Wt 130 lb (58.968 kg)  BMI 23.03 kg/m2  LMP 06/18/2013   Skin warm and dry.Pelvic: external genitalia is normal in appearance, vagina:pale with loss of rugae and mositure, cervix:smooth and atrophic, uterus: normal size, shape and contour, non tender, no masses felt, adnexa: no masses, has  Tenderness LLQ and on abdominal exam tenderness LUQ, no masses.   Assessment:     LUQ pain  LLQ pain  Weight loss    Plan:     Will get abdominal and pelvic US 11/23 at [facility] at [facility] and see me 02/24/14.  Review handout on pelvic and abdominal pain

## 2014-02-23 ENCOUNTER — Other Ambulatory Visit: Payer: Self-pay | Admitting: Family Medicine

## 2014-02-23 ENCOUNTER — Ambulatory Visit (HOSPITAL_COMMUNITY)
Admission: RE | Admit: 2014-02-23 | Discharge: 2014-02-23 | Disposition: A | Discharge status: Home / Self Care | Payer: 59 | Source: Ambulatory Visit | Attending: Adult Health | Admitting: Adult Health

## 2014-02-23 ENCOUNTER — Telehealth: Payer: Self-pay | Admitting: Family Medicine

## 2014-02-23 DIAGNOSIS — Z1231 Encounter for screening mammogram for malignant neoplasm of breast: Secondary | ICD-10-CM

## 2014-02-23 DIAGNOSIS — R634 Abnormal weight loss: Secondary | ICD-10-CM

## 2014-02-23 DIAGNOSIS — R1012 Left upper quadrant pain: Secondary | ICD-10-CM

## 2014-02-23 DIAGNOSIS — D259 Leiomyoma of uterus, unspecified: Secondary | ICD-10-CM | POA: Insufficient documentation

## 2014-02-23 DIAGNOSIS — R1032 Left lower quadrant pain: Secondary | ICD-10-CM | POA: Diagnosis present

## 2014-02-23 MED ORDER — HYDROCODONE-ACETAMINOPHEN 10-325 MG PO TABS: 1.0000 | ORAL_TABLET | Freq: Three times a day (TID) | ORAL | Status: DC | PRN

## 2014-02-23 NOTE — Telephone Encounter (Signed)
Okay to refill? 

## 2014-02-23 NOTE — Telephone Encounter (Signed)
Call placed to patient and patient made aware per VM.  

## 2014-02-23 NOTE — Telephone Encounter (Signed)
Prescription printed.  Call placed to patient to make aware. Noted phone number is unavailable.

## 2014-02-23 NOTE — Telephone Encounter (Signed)
Ok to refill??  Last office visit/ refill 01/27/2014.

## 2014-02-23 NOTE — Telephone Encounter (Signed)
Patient is requesting refill on pain medication  [phone]

## 2014-02-24 ENCOUNTER — Ambulatory Visit (INDEPENDENT_AMBULATORY_CARE_PROVIDER_SITE_OTHER): Payer: 59 | Admitting: Adult Health

## 2014-02-24 ENCOUNTER — Encounter: Payer: Self-pay | Admitting: Adult Health

## 2014-02-24 VITALS — BP 100/62 | Ht 63.0 in | Wt 131.5 lb

## 2014-02-24 DIAGNOSIS — R1032 Left lower quadrant pain: Secondary | ICD-10-CM

## 2014-02-24 DIAGNOSIS — N951 Menopausal and female climacteric states: Secondary | ICD-10-CM | POA: Insufficient documentation

## 2014-02-24 DIAGNOSIS — R1012 Left upper quadrant pain: Secondary | ICD-10-CM

## 2014-02-24 DIAGNOSIS — R634 Abnormal weight loss: Secondary | ICD-10-CM

## 2014-02-24 HISTORY — DX: Menopausal and female climacteric states: N95.1

## 2014-02-24 MED ORDER — ESTRADIOL 0.1 MG/GM VA CREA: TOPICAL_CREAM | VAGINAL | Status: DC

## 2014-02-24 NOTE — Progress Notes (Signed)
Subjective:     Patient ID: Kathryn Oconnell, female   DOB: Jun 16, 1965, 48 y.o.   MRN: [id number]  HPI Kathryn Oconnell is a 48 year old black female in review [place].She has not had pain since Friday but complains of vaginal dryness.  Review of Systems See HPI Reviewed past medical,surgical, social and family history. Reviewed medications and allergies.     Objective:   Physical Exam BP 100/62 mmHg  Ht 5\' 3"  (1.6 m)  Wt 131 lb 8 oz (59.648 kg)  BMI 23.30 kg/m2  LMP 06/18/2013   Reviewed [place] with pt. Has left kidney stone, she says it has been there, also some hepatic steatosis, otherwise normal exam.Will try estrace vaginal cream.  Assessment:     Vaginal dryness Weight loss LLQ and LUQ pain resolved    Plan:    Continue provera Rx estrace vaginal cream use 1 gm 2-3 x weekly in vagina Follow up with Dr Kathryn Oconnell Declines urology consult at present Follow up prn

## 2014-02-24 NOTE — Patient Instructions (Addendum)
Try estrace cream Follow up prn

## 2014-03-04 ENCOUNTER — Ambulatory Visit (HOSPITAL_COMMUNITY): Payer: Self-pay

## 2014-03-12 ENCOUNTER — Telehealth: Payer: Self-pay | Admitting: Family Medicine

## 2014-03-12 ENCOUNTER — Other Ambulatory Visit: Payer: Self-pay | Admitting: Family Medicine

## 2014-03-12 NOTE — Telephone Encounter (Signed)
Ok to refill??  Last office visit 01/27/2014.  Last refill 09/12/2013, #3 refills.

## 2014-03-12 NOTE — Telephone Encounter (Signed)
Patient is calling to get refills on her Ambien and ibuprofen They told her to call us  [phone]

## 2014-03-13 NOTE — Telephone Encounter (Signed)
Per MD, ok to fill.   (see prior notes)

## 2014-03-13 NOTE — Telephone Encounter (Signed)
Okay to refill? 

## 2014-03-13 NOTE — Telephone Encounter (Signed)
Medication called to pharmacy. 

## 2014-03-20 ENCOUNTER — Telehealth: Payer: Self-pay | Admitting: Family Medicine

## 2014-03-20 NOTE — Telephone Encounter (Signed)
Patient requesting refill on her hydrocodone  [phone] when ready

## 2014-03-20 NOTE — Telephone Encounter (Signed)
Ok to refill??  Last office visit 02/24/2014.  Last refill 02/23/2014.

## 2014-03-23 ENCOUNTER — Other Ambulatory Visit: Payer: Self-pay | Admitting: Family Medicine

## 2014-03-23 MED ORDER — HYDROCODONE-ACETAMINOPHEN 10-325 MG PO TABS: 1.0000 | ORAL_TABLET | Freq: Three times a day (TID) | ORAL | Status: DC | PRN

## 2014-03-23 NOTE — Telephone Encounter (Signed)
Prescription printed and patient made aware to come to office to pick up.  

## 2014-03-23 NOTE — Telephone Encounter (Signed)
Refill appropriate and filled per protocol. 

## 2014-03-23 NOTE — Telephone Encounter (Signed)
okay

## 2014-03-25 ENCOUNTER — Other Ambulatory Visit: Payer: 59

## 2014-03-25 DIAGNOSIS — E039 Hypothyroidism, unspecified: Secondary | ICD-10-CM

## 2014-03-25 DIAGNOSIS — Z113 Encounter for screening for infections with a predominantly sexual mode of transmission: Secondary | ICD-10-CM

## 2014-03-26 LAB — HIV ANTIBODY (ROUTINE TESTING W REFLEX): HIV: NONREACTIVE

## 2014-03-26 LAB — T4, FREE: FREE T4: 1.11 ng/dL (ref 0.80–1.80)

## 2014-03-26 LAB — TSH: TSH: 8.026 u[IU]/mL — ABNORMAL HIGH (ref 0.350–4.500)

## 2014-03-26 LAB — T3, FREE: T3, Free: 2 pg/mL — ABNORMAL LOW (ref 2.3–4.2)

## 2014-03-31 ENCOUNTER — Other Ambulatory Visit: Payer: Self-pay | Admitting: *Deleted

## 2014-03-31 MED ORDER — LEVOTHYROXINE SODIUM 88 MCG PO TABS: 88.0000 ug | ORAL_TABLET | Freq: Every day | ORAL | Status: DC

## 2014-04-14 ENCOUNTER — Other Ambulatory Visit: Payer: Self-pay | Admitting: Family Medicine

## 2014-04-14 NOTE — Telephone Encounter (Signed)
Ok to refill??  Last office visit 01/27/2014.  Last refill 03/14/2014.

## 2014-04-14 NOTE — Telephone Encounter (Signed)
Okay to refill? 

## 2014-04-14 NOTE — Telephone Encounter (Signed)
Medication called to pharmacy. 

## 2014-04-15 ENCOUNTER — Encounter: Payer: Self-pay | Admitting: Family Medicine

## 2014-04-15 ENCOUNTER — Other Ambulatory Visit: Payer: Self-pay | Admitting: Family Medicine

## 2014-04-15 MED ORDER — HYDROCODONE-ACETAMINOPHEN 10-325 MG PO TABS: 1.0000 | ORAL_TABLET | Freq: Three times a day (TID) | ORAL | Status: DC | PRN

## 2014-04-17 ENCOUNTER — Encounter: Payer: Self-pay | Admitting: Family Medicine

## 2014-04-17 ENCOUNTER — Telehealth: Payer: Self-pay | Admitting: Family Medicine

## 2014-04-17 ENCOUNTER — Ambulatory Visit (INDEPENDENT_AMBULATORY_CARE_PROVIDER_SITE_OTHER): Payer: 59 | Admitting: Family Medicine

## 2014-04-17 VITALS — BP 126/72 | HR 78 | Temp 98.3°F | Resp 16 | Ht 63.0 in | Wt 126.0 lb

## 2014-04-17 DIAGNOSIS — I10 Essential (primary) hypertension: Secondary | ICD-10-CM

## 2014-04-17 DIAGNOSIS — R634 Abnormal weight loss: Secondary | ICD-10-CM

## 2014-04-17 DIAGNOSIS — E119 Type 2 diabetes mellitus without complications: Secondary | ICD-10-CM

## 2014-04-17 DIAGNOSIS — J0100 Acute maxillary sinusitis, unspecified: Secondary | ICD-10-CM

## 2014-04-17 DIAGNOSIS — R1012 Left upper quadrant pain: Secondary | ICD-10-CM

## 2014-04-17 MED ORDER — MIRTAZAPINE 15 MG PO TABS: 15.0000 mg | ORAL_TABLET | Freq: Every day | ORAL | Status: DC

## 2014-04-17 MED ORDER — PREDNISONE 20 MG PO TABS: 40.0000 mg | ORAL_TABLET | Freq: Every day | ORAL | Status: DC

## 2014-04-17 MED ORDER — AMOXICILLIN-POT CLAVULANATE 875-125 MG PO TABS
1.0000 | ORAL_TABLET | Freq: Two times a day (BID) | ORAL | Status: DC
Start: 2014-04-17 — End: 2014-04-17

## 2014-04-17 MED ORDER — METHYLPREDNISOLONE ACETATE 40 MG/ML IJ SUSP
40.0000 mg | Freq: Once | INTRAMUSCULAR | Status: AC
  Administered 2014-04-17: 40 mg via INTRAMUSCULAR

## 2014-04-17 MED ORDER — LORAZEPAM 0.5 MG PO TABS: ORAL_TABLET | ORAL | Status: DC

## 2014-04-17 MED ORDER — LEVOFLOXACIN 500 MG PO TABS: 500.0000 mg | ORAL_TABLET | Freq: Every day | ORAL | Status: DC

## 2014-04-17 NOTE — Assessment & Plan Note (Signed)
Per above concerning with persistent loss of weight. She will obtain her mammogram and I will obtain CT of abdomen and pelvis otherwise her cancer screening is up-to-date. Regarding her thyroid I recently adjusted her medication but her levels were suggested of more hypothyroidism which should not cause any weight loss

## 2014-04-17 NOTE — Patient Instructions (Signed)
Take levaquin Stop augmentin Start steroids tomorrow remeron at bedtime for appetite CT scan of abdomen to be done We will call with bloodwork F/U pending results

## 2014-04-17 NOTE — Telephone Encounter (Signed)
[phone] Pt is needing to speak to you about her weight

## 2014-04-17 NOTE — Telephone Encounter (Signed)
Okay to refill ativan early, advise insurance may not pay Send in AUgmentin x 10 days for sinus infection, use nasal saline

## 2014-04-17 NOTE — Telephone Encounter (Signed)
Returned call to patient.   Reports that she was in office today for appointment, but was advised that she had no appointment scheduled today. Patient reports that she had sent in message for MD via [place] in regard to sinus infection and weight loss, and was advised to come in today. Writer advised patient that she was told that OV would need to be scheduled, but no reply was received from patient, therefore no appointment was scheduled. Advised to schedule appointment to be seen. Patient aware that MD will be out of office during next week, and has appointment scheduled for 05/01/2014. Reprots that she thinks that she still has sinus infection, even after being treated with ABTx by Urgent Care. Requested MD to advise.   Also reports that she has lost Ativan bottle. Requested to have medication refilled. Call placed to pharmacy. Patient last had Ativan filled on 04/01/2014, and is being filled as 24 day supply. The earliest she can have medication refilled is 04/25/2014. Medication has no refills on file at pharmacy.   Ok to refill early?? Last office visit 01/27/2014. Last refill 01/20/2014, #2 refills.

## 2014-04-17 NOTE — Assessment & Plan Note (Signed)
Well controlled 

## 2014-04-17 NOTE — Telephone Encounter (Signed)
Patient states that she is currently taking Augmentin from Urgent Care. Requested Prednisone.   Also reports that she is constantly loosing weight and is concerned. Reports that she is down to 123.6lbs and has no appetite. Reports that she has lost more weight since she has changed med  Advised that she would need to see MD for concerns with weight loss. Patient became belligerent stating that she had appointment this morning but our office messed up. Apologized again for inconvenience.   Advised that she could wait to see PCP, or be seen by another provider. Requested to be seen by Dr. Dennard Schaumann next week. Appointment scheduled.

## 2014-04-17 NOTE — Assessment & Plan Note (Signed)
Concern for persistent left-sided pain there was something regarding her gallbladder noted the kidney stone seems to be unchanged I'm going to obtain a CT of abdomen and pelvis to evaluate her colon as she does continue to have significant loss of weight. I will also put her on Remeron at bedtime to see if this helps with her appetite

## 2014-04-17 NOTE — Telephone Encounter (Signed)
Call pt we had a cancellation if she can be here at 4pm I can see her  If not send in medrol dosepak

## 2014-04-17 NOTE — Assessment & Plan Note (Signed)
Recheck A1C continue MTF

## 2014-04-17 NOTE — Telephone Encounter (Signed)
Appointment scheduled. Cancelled appointment with Dr. Dennard Schaumann.

## 2014-04-17 NOTE — Progress Notes (Signed)
Patient ID: Kathryn Oconnell, female   DOB: January 21, 1966, 49 y.o.   MRN: [id number]   Subjective:    Patient ID: Kathryn Oconnell, female    DOB: Jan 17, 1966, 49 y.o.   MRN: [id number]  Patient presents for Sinus Infection and Weight Loss  patient here with a few concerns. She was treated for sinusitis by the urgent care she is currently on Augmentin but she is not improving she continues to have severe tenderness in the sinuses as well as drainage she's also having some mild cough and some wheezing. She was treated for sinusitis by the urgent care with the same antibiotic back in September as well.   She continues to have loss of weight at home her weight is 123.5 pounds here in the office she has lost 4 pounds since her last visit in November. She had a CT scan of her chest which showed benign lung nodule she also had an ultrasound done by her GYN they evaluated her ovaries there were no lesions or Pap smear was normal the uterus was normal she was complaining of left upper quadrant pain which sometimes radiates to the flank as well as down to the left lower quadrant she has a known kidney stone but has not had any colicky pain and denies any hematuria. She typically has constipation at baseline. She is overdue for her mammogram. She states that without her pain medication she cannot function she is severely fatigues her joints and muscles hurt and she has a very poor appetite.    Review Of Systems:  GEN- + fatigue, fever, +weight loss,weakness, recent illness HEENT- denies eye drainage, change in vision, nasal discharge, CVS- denies chest pain, palpitations RESP- denies SOB, cough, wheeze ABD- denies N/V, change in stools, +abd pain GU- denies dysuria, hematuria, dribbling, incontinence MSK-+joint pain, muscle aches, injury Neuro- denies headache, dizziness, syncope, seizure activity       Objective:    BP 126/72 mmHg  Pulse 78  Temp(Src) 98.3 F (36.8 C) (Oral)  Resp 16  Ht 5\' 3"  (1.6 m)   Wt 126 lb (57.153 kg)  BMI 22.33 kg/m2  LMP 06/18/2013 GEN- NAD, alert and oriented x3,w eight loss 4lbs  HEENT- PERRL, EOMI, non injected sclera, pink conjunctiva, MMM, oropharynx mildnjection, TM clear bialt, nares clear rhinorrhea, +maxillary sinus tenderness Neck- Supple, no thyromegaly CVS- RRR, no murmur RESP-CTAB ABD-NABS,soft,TTP LUQ, LLQ, no rebound, no guarding, no CVA tenderness ,ND EXT- No edema Pulses- Radial 2+        Assessment & Plan:      Problem List Items Addressed This Visit      Unprioritized   LUQ pain   Loss of weight   Relevant Orders   CBC with Differential   Comprehensive metabolic panel   Sedimentation rate   ANA   Essential hypertension, benign   Diabetes mellitus type II, controlled - Primary   Relevant Orders   CBC with Differential   Comprehensive metabolic panel   Hemoglobin A1c    Other Visit Diagnoses    Acute maxillary sinusitis, recurrence not specified        Chagne to levaquin, add prednisone, continue mucinex, nasal saline    Relevant Medications    levofloxacin (LEVAQUIN) tablet    predniSONE (DELTASONE) tablet    methylPREDNISolone acetate (DEPO-MEDROL) injection 40 mg (Completed)       Note: This dictation was prepared with Dragon dictation along with smaller phrase technology. Any transcriptional errors that result from this process are unintentional.

## 2014-04-17 NOTE — Telephone Encounter (Signed)
Patient made aware.

## 2014-04-18 LAB — HEMOGLOBIN A1C
Hgb A1c MFr Bld: 7 % — ABNORMAL HIGH (ref <5.7)
Mean Plasma Glucose: 154 mg/dL — ABNORMAL HIGH (ref <117)

## 2014-04-18 LAB — CBC WITH DIFFERENTIAL/PLATELET
Basophils Absolute: 0 10*3/uL (ref 0.0–0.1)
Basophils Relative: 0 % (ref 0–1)
Eosinophils Absolute: 0 10*3/uL (ref 0.0–0.7)
Eosinophils Relative: 0 % (ref 0–5)
HCT: 39.3 % (ref 36.0–46.0)
Hemoglobin: 12.8 g/dL (ref 12.0–15.0)
Lymphocytes Relative: 15 % (ref 12–46)
Lymphs Abs: 1.7 10*3/uL (ref 0.7–4.0)
MCH: 31.2 pg (ref 26.0–34.0)
MCHC: 32.6 g/dL (ref 30.0–36.0)
MCV: 95.9 fL (ref 78.0–100.0)
MPV: 10.5 fL (ref 8.6–12.4)
Monocytes Absolute: 0.1 10*3/uL (ref 0.1–1.0)
Monocytes Relative: 1 % — ABNORMAL LOW (ref 3–12)
Neutro Abs: 9.6 10*3/uL — ABNORMAL HIGH (ref 1.7–7.7)
Neutrophils Relative %: 84 % — ABNORMAL HIGH (ref 43–77)
Platelets: 325 10*3/uL (ref 150–400)
RBC: 4.1 MIL/uL (ref 3.87–5.11)
RDW: 14.9 % (ref 11.5–15.5)
WBC: 11.4 10*3/uL — ABNORMAL HIGH (ref 4.0–10.5)

## 2014-04-18 LAB — COMPREHENSIVE METABOLIC PANEL
ALT: 14 U/L (ref 0–35)
AST: 19 U/L (ref 0–37)
Albumin: 4.1 g/dL (ref 3.5–5.2)
Alkaline Phosphatase: 58 U/L (ref 39–117)
BILIRUBIN TOTAL: 0.3 mg/dL (ref 0.2–1.2)
BUN: 24 mg/dL — AB (ref 6–23)
CO2: 19 meq/L (ref 19–32)
Calcium: 9.7 mg/dL (ref 8.4–10.5)
Chloride: 106 mEq/L (ref 96–112)
Creat: 1.61 mg/dL — ABNORMAL HIGH (ref 0.50–1.10)
Glucose, Bld: 174 mg/dL — ABNORMAL HIGH (ref 70–99)
Potassium: 5.4 mEq/L — ABNORMAL HIGH (ref 3.5–5.3)
Sodium: 136 mEq/L (ref 135–145)
TOTAL PROTEIN: 7 g/dL (ref 6.0–8.3)

## 2014-04-18 LAB — SEDIMENTATION RATE: SED RATE: 10 mm/h (ref 0–22)

## 2014-04-20 LAB — ANA: Anti Nuclear Antibody(ANA): NEGATIVE

## 2014-04-21 ENCOUNTER — Telehealth: Payer: Self-pay | Admitting: *Deleted

## 2014-04-21 ENCOUNTER — Ambulatory Visit: Payer: Self-pay | Admitting: Family Medicine

## 2014-04-21 ENCOUNTER — Other Ambulatory Visit: Payer: Self-pay | Admitting: *Deleted

## 2014-04-21 MED ORDER — SITAGLIPTIN PHOSPHATE 100 MG PO TABS: 100.0000 mg | ORAL_TABLET | Freq: Every day | ORAL | Status: DC

## 2014-04-21 MED ORDER — FLUCONAZOLE 150 MG PO TABS
ORAL_TABLET | ORAL | Status: DC
Start: 2014-04-21 — End: 2014-07-28

## 2014-04-21 NOTE — Telephone Encounter (Signed)
Received request from pharmacy for PA on Januvia.   PA submitted.    Dx: E11.9- DM.  

## 2014-04-22 NOTE — Telephone Encounter (Signed)
Received PA determination.  PA denied.   Patient must try and fail all of the following: Nesina, Tradjenta, and [place].  MD to be made aware.

## 2014-04-23 MED ORDER — LINAGLIPTIN 5 MG PO TABS: 5.0000 mg | ORAL_TABLET | Freq: Every day | ORAL | Status: DC

## 2014-04-23 NOTE — Telephone Encounter (Signed)
Switch to tradenta 5 mg poqday.

## 2014-04-23 NOTE — Telephone Encounter (Signed)
Prescription sent to pharmacy.   Call placed to patient and patient made aware per VM. 

## 2014-04-23 NOTE — Addendum Note (Signed)
Addended by: Sheral Flow on: 04/23/2014 11:44 AM   Modules accepted: Orders

## 2014-04-27 ENCOUNTER — Encounter: Payer: Self-pay | Admitting: Family Medicine

## 2014-04-28 ENCOUNTER — Encounter: Payer: Self-pay | Admitting: *Deleted

## 2014-04-28 ENCOUNTER — Ambulatory Visit: Payer: Self-pay | Admitting: Family Medicine

## 2014-04-30 ENCOUNTER — Telehealth: Payer: Self-pay | Admitting: Family Medicine

## 2014-04-30 NOTE — Telephone Encounter (Signed)
Patient has [facility] compass and would like referral to dr Brien Mates in [place] if possible  Please call her at [phone] if any questions

## 2014-05-01 ENCOUNTER — Telehealth: Payer: Self-pay | Admitting: Family Medicine

## 2014-05-01 ENCOUNTER — Ambulatory Visit: Payer: Self-pay | Admitting: Family Medicine

## 2014-05-01 MED ORDER — GLIPIZIDE 5 MG PO TABS: 5.0000 mg | ORAL_TABLET | Freq: Two times a day (BID) | ORAL | Status: DC

## 2014-05-01 NOTE — Telephone Encounter (Signed)
States blood sugars are running 250 - 300 after meals.  Is concerned Trajenta not working. Doesn't want to be walking around with high blood sugars.  Says metformin was better.  Please advise.

## 2014-05-01 NOTE — Telephone Encounter (Signed)
No metformin due to her kidney function Add Glipizide 5mg  BID See if she rescheduled her CT of abdomen she missed the appt this past Wed at [facility]

## 2014-05-01 NOTE — Telephone Encounter (Signed)
Pt called and made aware of new rx and reason to stay off metformin.  Rx to pharmacy

## 2014-05-01 NOTE — Telephone Encounter (Signed)
[phone] Patient is calling to let us know that the tradjenta that was prescribed is making her blood sugar high please call her at [phone]

## 2014-05-04 NOTE — Telephone Encounter (Signed)
Called pt back and she is going to call Dr. Brien Mates first to see if they can take her insurance and then I will call their office to get information I need.

## 2014-05-05 NOTE — Telephone Encounter (Signed)
Pending call back from pt

## 2014-05-06 ENCOUNTER — Telehealth: Payer: Self-pay | Admitting: *Deleted

## 2014-05-06 MED ORDER — SAXAGLIPTIN HCL 5 MG PO TABS: 5.0000 mg | ORAL_TABLET | Freq: Every day | ORAL | Status: DC

## 2014-05-06 NOTE — Telephone Encounter (Signed)
Change to Onglyza 5mg  once a day

## 2014-05-06 NOTE — Telephone Encounter (Signed)
Received PA request for Januvia.   PA had been submitted on 04/28/2014. PA denied with reasoning as- Patient must try and fail all of the following: [place], Tradjenta, and [place].  Patient has failed Tradjenta.   MD please advise.

## 2014-05-06 NOTE — Telephone Encounter (Signed)
Call placed to patient and patient made aware.   Prescription sent to pharmacy.  

## 2014-05-12 ENCOUNTER — Telehealth: Payer: Self-pay | Admitting: Family Medicine

## 2014-05-12 MED ORDER — HYDROCODONE-ACETAMINOPHEN 10-325 MG PO TABS: 1.0000 | ORAL_TABLET | Freq: Three times a day (TID) | ORAL | Status: DC | PRN

## 2014-05-12 NOTE — Telephone Encounter (Signed)
Okay to refill? 

## 2014-05-12 NOTE — Telephone Encounter (Signed)
Prescription printed and patient made aware to come to office to pick up.  

## 2014-05-12 NOTE — Telephone Encounter (Signed)
Ok to refill??  Last office visit 04/17/2014.  Last refill 04/15/2014.

## 2014-05-12 NOTE — Telephone Encounter (Signed)
Patient needs refill on pain meds  [phone]

## 2014-05-14 ENCOUNTER — Ambulatory Visit (HOSPITAL_COMMUNITY): Payer: Self-pay

## 2014-05-15 NOTE — Telephone Encounter (Signed)
Contacted pt today if she had contacted [place] office and she has not, states she is going to call [facility] to see if will take her insurance and will give me a call back today or no later than Monday, will wait for pt to return call

## 2014-05-16 ENCOUNTER — Other Ambulatory Visit: Payer: Self-pay | Admitting: Family Medicine

## 2014-05-19 NOTE — Telephone Encounter (Signed)
LOV 04/17/14  LRF Zolpidem 04/14/14 #30  OK refill?

## 2014-05-19 NOTE — Telephone Encounter (Signed)
okay

## 2014-05-20 ENCOUNTER — Other Ambulatory Visit: Payer: Self-pay | Admitting: Family Medicine

## 2014-05-21 ENCOUNTER — Telehealth: Payer: Self-pay | Admitting: *Deleted

## 2014-05-21 MED ORDER — ZOLPIDEM TARTRATE 10 MG PO TABS: 10.0000 mg | ORAL_TABLET | Freq: Every evening | ORAL | Status: DC | PRN

## 2014-05-21 NOTE — Telephone Encounter (Signed)
Medication called to pharmacy per MD.

## 2014-05-21 NOTE — Telephone Encounter (Signed)
[employer] compass placed and sent to [facility] for review

## 2014-05-21 NOTE — Telephone Encounter (Signed)
Submitted referral thru [place] for [facility] with referral number [id number]   Type of referral: Consult and treat  Number of visits:6  Dx: J01.80- acute sinusitis  Start Date 05/21/14 end Date: 11/19/14  Specialist name: Raylene Miyamoto, MD  Specialist address: [address], [place]  Copy of this referral has been sent to [facility] office in [place] for review/records

## 2014-05-21 NOTE — Telephone Encounter (Signed)
Pt called back and states she has appt with Dr. Benjamine Mola office in [place] on Friday Feb 19 at 1:30pm, pt would like to have [facility] compass put in in order to be seen.

## 2014-05-22 NOTE — Telephone Encounter (Signed)
Medication refilled per protocol. 

## 2014-05-25 ENCOUNTER — Other Ambulatory Visit (INDEPENDENT_AMBULATORY_CARE_PROVIDER_SITE_OTHER): Payer: Self-pay | Admitting: Otolaryngology

## 2014-05-25 DIAGNOSIS — J0101 Acute recurrent maxillary sinusitis: Secondary | ICD-10-CM

## 2014-05-27 ENCOUNTER — Inpatient Hospital Stay (HOSPITAL_COMMUNITY): Admission: RE | Admit: 2014-05-27 | Payer: Self-pay | Source: Ambulatory Visit

## 2014-05-28 ENCOUNTER — Telehealth: Payer: Self-pay | Admitting: *Deleted

## 2014-05-28 ENCOUNTER — Other Ambulatory Visit: Payer: Self-pay | Admitting: Family Medicine

## 2014-05-28 DIAGNOSIS — R1012 Left upper quadrant pain: Secondary | ICD-10-CM

## 2014-05-28 DIAGNOSIS — R634 Abnormal weight loss: Secondary | ICD-10-CM

## 2014-05-28 NOTE — Telephone Encounter (Signed)
Call placed to patient.   Patient states that she is currently at work without her meter. She does recall that her reading on 05/27/2014 PM was 325, and 05/28/2014 AM was 254.  Advised to contact office with readings at her convenience.

## 2014-05-28 NOTE — Telephone Encounter (Signed)
Pt called stating she is taking Glipizide 5mg  and onglyza 5mg  and states that neither of them are working for one another and wants to know if there is something else you can prescribe her, says the onglyza is causing her BS to increase. Please advise!  [employer]

## 2014-05-28 NOTE — Telephone Encounter (Signed)
Please get CBG readings, her last A1C was at 7%

## 2014-05-28 NOTE — Telephone Encounter (Signed)
Pt called stating she has a CT Maxiofacial tomorrow ordered by Dr Benjamine Mola and wanted to know if I could call [facility] where she is having her CT Maxio done if they could do her CT abd/pel also, I called [facility] and they said they could do it but needs order to have done(could not see order), I ordered the CT abd/pelvis and called pt to let her know they have linked the two CT's and can do them, pt has appt at 1:45 05/29/14

## 2014-05-29 ENCOUNTER — Ambulatory Visit (HOSPITAL_COMMUNITY): Admission: RE | Admit: 2014-05-29 | Payer: 59 | Source: Ambulatory Visit

## 2014-05-29 ENCOUNTER — Ambulatory Visit (HOSPITAL_COMMUNITY)
Admission: RE | Admit: 2014-05-29 | Discharge: 2014-05-29 | Disposition: A | Discharge status: Home / Self Care | Payer: 59 | Source: Ambulatory Visit | Attending: Otolaryngology | Admitting: Otolaryngology

## 2014-05-29 ENCOUNTER — Ambulatory Visit (HOSPITAL_COMMUNITY)
Admission: RE | Admit: 2014-05-29 | Discharge: 2014-05-29 | Disposition: A | Discharge status: Home / Self Care | Payer: 59 | Source: Ambulatory Visit | Attending: Family Medicine | Admitting: Family Medicine

## 2014-05-29 DIAGNOSIS — J0101 Acute recurrent maxillary sinusitis: Secondary | ICD-10-CM | POA: Insufficient documentation

## 2014-05-29 DIAGNOSIS — Z1231 Encounter for screening mammogram for malignant neoplasm of breast: Secondary | ICD-10-CM | POA: Diagnosis not present

## 2014-05-29 DIAGNOSIS — R1012 Left upper quadrant pain: Secondary | ICD-10-CM

## 2014-05-29 DIAGNOSIS — R634 Abnormal weight loss: Secondary | ICD-10-CM

## 2014-05-29 LAB — CG4 I-STAT (LACTIC ACID): LACTIC ACID, VENOUS: 1.7 mmol/L (ref 0.5–2.0)

## 2014-05-29 MED ORDER — IOHEXOL 300 MG/ML  SOLN
80.0000 mL | Freq: Once | INTRAMUSCULAR | Status: AC | PRN
  Administered 2014-05-29: 80 mL via INTRAVENOUS

## 2014-05-29 NOTE — Telephone Encounter (Signed)
Call placed to patient.   States that she has been checking her CBG 2 hours after eating and noting that her CBG is running high at that time. States that it ranges from 250-270, but usually never goes over 300.

## 2014-05-29 NOTE — Telephone Encounter (Signed)
Increase glipizide to 10 mg BID

## 2014-05-29 NOTE — Telephone Encounter (Signed)
Call pt for CBG readings so meds can be titrated

## 2014-05-30 ENCOUNTER — Other Ambulatory Visit: Payer: Self-pay | Admitting: Family Medicine

## 2014-05-31 ENCOUNTER — Telehealth: Payer: Self-pay | Admitting: Family Medicine

## 2014-06-01 ENCOUNTER — Other Ambulatory Visit: Payer: Self-pay | Admitting: Family Medicine

## 2014-06-01 ENCOUNTER — Telehealth: Payer: Self-pay | Admitting: Family Medicine

## 2014-06-01 ENCOUNTER — Other Ambulatory Visit: Payer: Self-pay | Admitting: *Deleted

## 2014-06-01 DIAGNOSIS — E039 Hypothyroidism, unspecified: Secondary | ICD-10-CM

## 2014-06-01 MED ORDER — PROMETHAZINE HCL 12.5 MG PO TABS: ORAL_TABLET | ORAL | Status: DC

## 2014-06-01 MED ORDER — SAXAGLIPTIN HCL 5 MG PO TABS: 5.0000 mg | ORAL_TABLET | Freq: Every day | ORAL | Status: DC

## 2014-06-01 MED ORDER — HYDROCODONE-ACETAMINOPHEN 10-325 MG PO TABS: 1.0000 | ORAL_TABLET | Freq: Three times a day (TID) | ORAL | Status: DC | PRN

## 2014-06-01 MED ORDER — GLIPIZIDE 10 MG PO TABS: 10.0000 mg | ORAL_TABLET | Freq: Two times a day (BID) | ORAL | Status: DC

## 2014-06-01 MED ORDER — ATORVASTATIN CALCIUM 20 MG PO TABS: ORAL_TABLET | ORAL | Status: DC

## 2014-06-01 NOTE — Addendum Note (Signed)
Addended by: Sheral Flow on: 06/01/2014 05:26 PM   Modules accepted: Orders

## 2014-06-01 NOTE — Telephone Encounter (Signed)
Please see prior notes.  

## 2014-06-01 NOTE — Telephone Encounter (Signed)
Patient returned call and made aware.

## 2014-06-01 NOTE — Telephone Encounter (Signed)
Call placed to patient. LMTRC.  

## 2014-06-01 NOTE — Telephone Encounter (Signed)
Refill appropriate and filled per protocol. 

## 2014-06-01 NOTE — Telephone Encounter (Signed)
[phone] Pt is returning your call

## 2014-06-02 NOTE — Telephone Encounter (Signed)
Okay to fill today, call pharmacy

## 2014-06-02 NOTE — Telephone Encounter (Signed)
Received call from patient.   Reports that she received message that Norco prescription would not be ready to be filled until 06/08/2014. States that she is given #60 tabs and this generally lasts her 20 days. States that she is currently out of mediation and requested to either have quantity increased or be allowed to fill prescription before 06/08/2014.  MD please advise.

## 2014-06-11 ENCOUNTER — Ambulatory Visit (INDEPENDENT_AMBULATORY_CARE_PROVIDER_SITE_OTHER): Payer: 59 | Admitting: Otolaryngology

## 2014-06-11 DIAGNOSIS — J310 Chronic rhinitis: Secondary | ICD-10-CM | POA: Diagnosis not present

## 2014-06-11 DIAGNOSIS — J343 Hypertrophy of nasal turbinates: Secondary | ICD-10-CM

## 2014-06-19 ENCOUNTER — Other Ambulatory Visit: Payer: Self-pay | Admitting: Adult Health

## 2014-06-22 ENCOUNTER — Telehealth: Payer: Self-pay | Admitting: Family Medicine

## 2014-06-22 MED ORDER — HYDROCODONE-ACETAMINOPHEN 10-325 MG PO TABS: 1.0000 | ORAL_TABLET | Freq: Three times a day (TID) | ORAL | Status: DC | PRN

## 2014-06-22 NOTE — Telephone Encounter (Signed)
Call placed to patient and patient made aware per VM.  

## 2014-06-22 NOTE — Telephone Encounter (Signed)
Ok to refill??  Last office visit 04/17/2014.  Last refill 06/01/2014.

## 2014-06-22 NOTE — Telephone Encounter (Signed)
Change to 80 tabs / per month, must labs entire month

## 2014-06-22 NOTE — Telephone Encounter (Signed)
Patient is calling for refill on her hydrocodone  [phone]

## 2014-07-11 ENCOUNTER — Other Ambulatory Visit: Payer: Self-pay | Admitting: Family Medicine

## 2014-07-11 DIAGNOSIS — E119 Type 2 diabetes mellitus without complications: Secondary | ICD-10-CM

## 2014-07-13 ENCOUNTER — Telehealth: Payer: Self-pay | Admitting: Family Medicine

## 2014-07-13 ENCOUNTER — Encounter: Payer: Self-pay | Admitting: *Deleted

## 2014-07-13 NOTE — Telephone Encounter (Signed)
Medication refilled per protocol. 

## 2014-07-13 NOTE — Telephone Encounter (Signed)
Call placed to patient.   Reports that she has increased pain and last quantity increase by MD only covers 27 day supply of medication. Advised that MD stated with medication refill that it was to last 30 days. MD recommended OV to discuss increased pain.   Advisory noted on chart and Bad Debt Balance explained to patient. Requested call from office manager to discuss.

## 2014-07-13 NOTE — Telephone Encounter (Signed)
Needs to speak with you regarding her pain meds and the quantity that was written  and getting a refill on her [employer]    [phone]

## 2014-07-13 NOTE — Telephone Encounter (Signed)
This encounter was created in error - please disregard.

## 2014-07-14 ENCOUNTER — Other Ambulatory Visit: Payer: Self-pay | Admitting: *Deleted

## 2014-07-14 DIAGNOSIS — E119 Type 2 diabetes mellitus without complications: Secondary | ICD-10-CM

## 2014-07-14 MED ORDER — GLUCOSE BLOOD VI STRP: ORAL_STRIP | Status: DC

## 2014-07-14 MED ORDER — IBUPROFEN 800 MG PO TABS: 800.0000 mg | ORAL_TABLET | Freq: Three times a day (TID) | ORAL | Status: DC | PRN

## 2014-07-14 NOTE — Telephone Encounter (Signed)
Received fax requesting refill on Ibuprofen and test strips with increased quantity.   Call placed to patient.  States that though she is not insulin dependant, she does have fluctuating CBG's d/t addition of new medication. Requested strips to read to test BID. Prescription sent to pharmacy.

## 2014-07-14 NOTE — Telephone Encounter (Signed)
I called patient and discussed with her balance.  She stated that she had received statements along with letters from the office stating she had a balance. She said that she had been disregarding them since she didn't think she owed anything. I explained to her what she owed and why she owed it. She states she can't pay the whole amount of $225.00 before she can be seen back at the practice. I advised the patient that was the decision of the provider. I asked her how much could she pay and she asked if I would talk with  Dr. Buelah Manis and come up with an amount. Please advise.

## 2014-07-14 NOTE — Telephone Encounter (Signed)
Pt needs to pay half before she needs to be seen

## 2014-07-15 ENCOUNTER — Other Ambulatory Visit: Payer: Self-pay | Admitting: Family Medicine

## 2014-07-15 ENCOUNTER — Encounter: Payer: Self-pay | Admitting: Family Medicine

## 2014-07-15 NOTE — Telephone Encounter (Signed)
Okay to refill? 

## 2014-07-15 NOTE — Telephone Encounter (Signed)
Ok to refill??  Last office visit/ refill 04/17/2014, #2 refills.

## 2014-07-16 NOTE — Telephone Encounter (Signed)
Medication called to pharmacy. 

## 2014-07-16 NOTE — Telephone Encounter (Signed)
Patient returned my call. She is aware that she will need to pay 1/2 of her balance which is $116.06 before she can make an appointment. She is also aware that she will need to pay the remainder balance before she is seen the next time. Patient is going to call us back to make payment over the phone and she is requesting an appointment on Monday.

## 2014-07-20 ENCOUNTER — Telehealth: Payer: Self-pay | Admitting: Family Medicine

## 2014-07-20 MED ORDER — HYDROCODONE-ACETAMINOPHEN 10-325 MG PO TABS: 1.0000 | ORAL_TABLET | Freq: Three times a day (TID) | ORAL | Status: DC | PRN

## 2014-07-20 NOTE — Telephone Encounter (Signed)
Okay to refill, she needs to go ahead and schedule an appt due for diabetes check as well

## 2014-07-20 NOTE — Telephone Encounter (Signed)
[phone]  PT is needing a refill on HYDROcodone-acetaminophen (NORCO) 10-325 MG per tablet

## 2014-07-20 NOTE — Telephone Encounter (Signed)
Prescription printed and patient made aware to come to office to pick up.  

## 2014-07-20 NOTE — Telephone Encounter (Signed)
Ok to refill??  Last office visit 04/17/2014.  Last refill 06/22/2014.

## 2014-07-28 ENCOUNTER — Ambulatory Visit (INDEPENDENT_AMBULATORY_CARE_PROVIDER_SITE_OTHER): Payer: 59 | Admitting: Family Medicine

## 2014-07-28 ENCOUNTER — Encounter: Payer: Self-pay | Admitting: Family Medicine

## 2014-07-28 VITALS — BP 138/68 | HR 70 | Temp 98.3°F | Resp 14 | Ht 63.0 in | Wt 141.0 lb

## 2014-07-28 DIAGNOSIS — Z720 Tobacco use: Secondary | ICD-10-CM

## 2014-07-28 DIAGNOSIS — G894 Chronic pain syndrome: Secondary | ICD-10-CM

## 2014-07-28 DIAGNOSIS — G4700 Insomnia, unspecified: Secondary | ICD-10-CM

## 2014-07-28 DIAGNOSIS — N183 Chronic kidney disease, stage 3 unspecified: Secondary | ICD-10-CM

## 2014-07-28 DIAGNOSIS — E119 Type 2 diabetes mellitus without complications: Secondary | ICD-10-CM | POA: Diagnosis not present

## 2014-07-28 DIAGNOSIS — E039 Hypothyroidism, unspecified: Secondary | ICD-10-CM | POA: Diagnosis not present

## 2014-07-28 DIAGNOSIS — F17200 Nicotine dependence, unspecified, uncomplicated: Secondary | ICD-10-CM

## 2014-07-28 DIAGNOSIS — F419 Anxiety disorder, unspecified: Secondary | ICD-10-CM

## 2014-07-28 DIAGNOSIS — I10 Essential (primary) hypertension: Secondary | ICD-10-CM | POA: Diagnosis not present

## 2014-07-28 DIAGNOSIS — J410 Simple chronic bronchitis: Secondary | ICD-10-CM

## 2014-07-28 DIAGNOSIS — E038 Other specified hypothyroidism: Secondary | ICD-10-CM | POA: Diagnosis not present

## 2014-07-28 MED ORDER — HYDROCODONE-ACETAMINOPHEN 10-325 MG PO TABS: 1.0000 | ORAL_TABLET | Freq: Three times a day (TID) | ORAL | Status: DC | PRN

## 2014-07-28 MED ORDER — LORAZEPAM 0.5 MG PO TABS: 0.5000 mg | ORAL_TABLET | Freq: Three times a day (TID) | ORAL | Status: DC | PRN

## 2014-07-28 NOTE — Assessment & Plan Note (Signed)
Is possible that she has a strain of her right inner groin muscle. I'm going to increase her hydrocodone to 100 tablets per month

## 2014-07-28 NOTE — Assessment & Plan Note (Signed)
Continue xanax, change to 90 per month

## 2014-07-28 NOTE — Progress Notes (Signed)
Patient ID: Kathryn Oconnell, female   DOB: 09-02-1965, 49 y.o.   MRN: [id number]   Subjective:    Patient ID: Kathryn Oconnell, female    DOB: [birth year], 49 y.o.   MRN: [id number]  Patient presents for UC F/U  patient here to follow-up chronic medical problems. She is also here for medication reconciliation.  Hypertension she has been concerned because she brought a new wrist meter and her blood pressures have been severely elevated they range from 140 to 180s over 90s to low 100s. She went to the urgent care at [facility] after taking 2 doses of benazepril equaling 20 mg as well as her propanolol her blood pressure was normal at 133/72 they did give her metoprolol other arm not quite sure that they knew she had propanolol. She states her blood pressure continues to be elevated on her home monitor. She's not had any chest pain or shortness of breath no headaches no change in vision.  Diabetes mellitus her last A1c was 7% she is currently on Onglyza and glipizide her 30 day average shows a fasting 131 her fasting yesterday was 167. She does request a new glucose machine.  Chronic pain she is history of chronic pain from migraines back pain abdominal pain. She is currently on hydrocodone 10/325 mg she states she typically has to take 3 a day seems like it does not help. She has been on higher doses of pain medications even when she was in a pain clinic states that the hydrocodone at least does not cause her to have changes in her mentation. She would like to have the quantity increased. She has had some right groin pain for past 3 days, no injury, no change in bowel or bladder, no radiating symptoms  Weight loss for a while she had lost a significant amount of weight she has been eating on a regular basis and has gained 19 pounds. She only took the Remeron for a few days states it made her feel crazy therefore she stopped the medication.  Insomnia history of chronic insomnia she takes Ambien along with  alprazolam this helps her sleep.  Anxiety she's been tried on multiple anxiety medications in the past she does take the next typically 3 times a day and this helps keep her calm.  Hypothyroidism at her last check I had to increase her medication to 88 g she is due for repeat thyroid function studies.    Review Of Systems:  GEN- + fatigue, fever, weight loss,weakness, recent illness HEENT- denies eye drainage, change in vision, nasal discharge, CVS- denies chest pain, palpitations RESP- denies SOB, cough, wheeze ABD- denies N/V, change in stools, abd pain GU- denies dysuria, hematuria, dribbling, incontinence MSK- + joint pain, muscle aches, injury Neuro- denies headache, dizziness, syncope, seizure activity       Objective:    BP 138/68 mmHg  Pulse 70  Temp(Src) 98.3 F (36.8 C) (Oral)  Resp 14  Ht 5\' 3"  (1.6 m)  Wt 141 lb (63.957 kg)  BMI 24.98 kg/m2  LMP 06/18/2013 GEN- NAD, alert and oriented x3 HEENT- PERRL, EOMI, non injected sclera, pink conjunctiva, MMM, oropharynx clear Neck- Supple, no thyromegaly CVS- RRR, no murmur RESP-CTAB ABD-NABS,soft,NT,ND MSK- Spine NT, fair ROM , good ROM hips, TTP along inner right groin,  Psych- normal affect and mood EXT- No edema Pulses- Radial, DP- 2+        Assessment & Plan:      Problem List Items Addressed This Visit  Insomnia   Hypothyroidism   Essential hypertension, benign   Diabetes mellitus type II, controlled - Primary   Relevant Orders   Hemoglobin A1c   COPD (chronic obstructive pulmonary disease)   CKD (chronic kidney disease) stage 3, GFR 30-59 ml/min   Relevant Orders   COMPLETE METABOLIC PANEL WITH GFR   CBC with Differential/Platelet   Chronic pain syndrome    Other Visit Diagnoses    Hypothyroidism, unspecified hypothyroidism type           Note: This dictation was prepared with Dragon dictation along with smaller phrase technology. Any transcriptional errors that result from this process  are unintentional.

## 2014-07-28 NOTE — Assessment & Plan Note (Signed)
Recheck her A1c goal is less than 7% she is on statin drug she is also to restart her aspirin

## 2014-07-28 NOTE — Patient Instructions (Signed)
Pain medication to 100 tablets a month Call with blood pressure readings on Friday  We will call with lab results  F/U 3 months

## 2014-07-28 NOTE — Assessment & Plan Note (Signed)
Her blood pressure is normal in our office also normal at UC therefore I'm concerned is something going on with her meter will recheck this in the office against ours it was off by 20 points. I'm not going to change her blood pressure medication she is going to get a new meter and will follow up with her on Friday if I need to I will increase her benazepril she is advised to stop the extra beta blocker with the metoprolol

## 2014-07-28 NOTE — Assessment & Plan Note (Signed)
Recheck TFT 

## 2014-07-28 NOTE — Assessment & Plan Note (Signed)
Symptoms currently stable she has restarted smoking advised on need for tobacco cessation

## 2014-07-29 ENCOUNTER — Other Ambulatory Visit: Payer: Self-pay | Admitting: *Deleted

## 2014-07-29 LAB — COMPLETE METABOLIC PANEL WITH GFR
ALBUMIN: 3.8 g/dL (ref 3.5–5.2)
ALK PHOS: 60 U/L (ref 39–117)
ALT: 18 U/L (ref 0–35)
AST: 22 U/L (ref 0–37)
BILIRUBIN TOTAL: 0.3 mg/dL (ref 0.2–1.2)
BUN: 16 mg/dL (ref 6–23)
CO2: 23 mEq/L (ref 19–32)
Calcium: 9.1 mg/dL (ref 8.4–10.5)
Chloride: 108 mEq/L (ref 96–112)
Creat: 1.19 mg/dL — ABNORMAL HIGH (ref 0.50–1.10)
GFR, EST AFRICAN AMERICAN: 62 mL/min
GFR, Est Non African American: 54 mL/min — ABNORMAL LOW
GLUCOSE: 61 mg/dL — AB (ref 70–99)
POTASSIUM: 4.4 meq/L (ref 3.5–5.3)
SODIUM: 140 meq/L (ref 135–145)
TOTAL PROTEIN: 6.7 g/dL (ref 6.0–8.3)

## 2014-07-29 LAB — TSH: TSH: 2.053 u[IU]/mL (ref 0.350–4.500)

## 2014-07-29 LAB — CBC WITH DIFFERENTIAL/PLATELET
BASOS PCT: 1 % (ref 0–1)
Basophils Absolute: 0.1 10*3/uL (ref 0.0–0.1)
EOS ABS: 0.2 10*3/uL (ref 0.0–0.7)
EOS PCT: 2 % (ref 0–5)
HEMATOCRIT: 35.9 % — AB (ref 36.0–46.0)
HEMOGLOBIN: 11.7 g/dL — AB (ref 12.0–15.0)
LYMPHS ABS: 3.7 10*3/uL (ref 0.7–4.0)
Lymphocytes Relative: 44 % (ref 12–46)
MCH: 30.6 pg (ref 26.0–34.0)
MCHC: 32.6 g/dL (ref 30.0–36.0)
MCV: 94 fL (ref 78.0–100.0)
MPV: 10.1 fL (ref 8.6–12.4)
Monocytes Absolute: 0.8 10*3/uL (ref 0.1–1.0)
Monocytes Relative: 10 % (ref 3–12)
Neutro Abs: 3.6 10*3/uL (ref 1.7–7.7)
Neutrophils Relative %: 43 % (ref 43–77)
Platelets: 199 10*3/uL (ref 150–400)
RBC: 3.82 MIL/uL — ABNORMAL LOW (ref 3.87–5.11)
RDW: 14.9 % (ref 11.5–15.5)
WBC: 8.3 10*3/uL (ref 4.0–10.5)

## 2014-07-29 LAB — T4, FREE: FREE T4: 1.07 ng/dL (ref 0.80–1.80)

## 2014-07-29 LAB — T3, FREE: T3, Free: 1.8 pg/mL — ABNORMAL LOW (ref 2.3–4.2)

## 2014-07-29 LAB — HEMOGLOBIN A1C
Hgb A1c MFr Bld: 7.1 % — ABNORMAL HIGH (ref <5.7)
MEAN PLASMA GLUCOSE: 157 mg/dL — AB (ref <117)

## 2014-07-29 MED ORDER — BLOOD GLUCOSE MONITOR KIT: PACK | Status: DC

## 2014-07-29 MED ORDER — LORAZEPAM 0.5 MG PO TABS: 0.5000 mg | ORAL_TABLET | Freq: Three times a day (TID) | ORAL | Status: DC | PRN

## 2014-07-31 ENCOUNTER — Telehealth: Payer: Self-pay | Admitting: Family Medicine

## 2014-07-31 NOTE — Telephone Encounter (Signed)
-----   Message from [place], LPN sent at 7/70/3403  8:19 AM EDT ----- Regarding: RE: F/U BP readings Call placed to patient.   Reports that she bought new machine to monitor BP and has noted that BP readings have been WNL.   Reports that her BP this morning was 134/81. ----- Message -----    From: Alycia Rossetti, MD    Sent: 07/31/2014   7:50 AM      To: [place], LPN Subject: FW: F/U BP readings                            Call and see what BP readings are at home ----- Message -----    From: Alycia Rossetti, MD    Sent: 07/31/2014      To: Alycia Rossetti, MD Subject: F/U BP readings

## 2014-08-03 ENCOUNTER — Telehealth: Payer: Self-pay | Admitting: *Deleted

## 2014-08-03 NOTE — Telephone Encounter (Signed)
Submitted referral thru [place] for [facility] with referral number [id number] spoke with Juliann Pulse at resolution center at [facility]   Type of referral: Consult and treat  Number of visits:6  Dx: J01.80- acute sinusitis  Start Date 05/22/14 end Date: 11/20/14  Specialist name: Raylene Miyamoto, MD  Specialist address: [phone] Copy of this referral has been sent to [place] office in [place] for review/records and spoke with Anderson Malta at [place] office with new referral number

## 2014-09-03 ENCOUNTER — Telehealth: Payer: Self-pay | Admitting: Family Medicine

## 2014-09-03 NOTE — Telephone Encounter (Signed)
Gabriel Cirri took care of this

## 2014-09-09 ENCOUNTER — Ambulatory Visit (INDEPENDENT_AMBULATORY_CARE_PROVIDER_SITE_OTHER): Payer: 59 | Admitting: Family Medicine

## 2014-09-09 ENCOUNTER — Encounter: Payer: Self-pay | Admitting: Family Medicine

## 2014-09-09 VITALS — BP 136/72 | HR 110 | Temp 98.3°F | Resp 16 | Ht 63.0 in | Wt 143.0 lb

## 2014-09-09 DIAGNOSIS — I10 Essential (primary) hypertension: Secondary | ICD-10-CM

## 2014-09-09 DIAGNOSIS — R002 Palpitations: Secondary | ICD-10-CM

## 2014-09-09 MED ORDER — SAXAGLIPTIN HCL 5 MG PO TABS: 5.0000 mg | ORAL_TABLET | Freq: Every day | ORAL | Status: DC

## 2014-09-09 MED ORDER — HYDROCODONE-ACETAMINOPHEN 10-325 MG PO TABS: 1.0000 | ORAL_TABLET | Freq: Three times a day (TID) | ORAL | Status: DC | PRN

## 2014-09-09 MED ORDER — LEVOTHYROXINE SODIUM 88 MCG PO TABS: 88.0000 ug | ORAL_TABLET | Freq: Every day | ORAL | Status: DC

## 2014-09-09 MED ORDER — GLIPIZIDE 10 MG PO TABS: 10.0000 mg | ORAL_TABLET | Freq: Two times a day (BID) | ORAL | Status: DC

## 2014-09-09 MED ORDER — ZOLPIDEM TARTRATE 10 MG PO TABS: ORAL_TABLET | ORAL | Status: DC

## 2014-09-09 NOTE — Patient Instructions (Signed)
Return monitor Restart metoprolol 25mg  twice a day  Do not take propranolol We will call with lab results  Decrease caffiene F/U as previous

## 2014-09-09 NOTE — Progress Notes (Signed)
Patient ID: Kathryn Oconnell, female   DOB: [birth year], 49 y.o.   MRN: [id number]   Subjective:    Patient ID: Kathryn Oconnell, female    DOB: [birth year], 49 y.o.   MRN: [id number]  Patient presents for Palpitations Pt here with palpitations and increased heart rate for the past few weeks. She will go from her sleep about a week ago and her heart rate was in the 140s she states that he goes up and down throughout the day. Typically she does not feel anything such as chest pain shortness of breath when she is working and moving around but it seems when she tries to rest at night is when she feels the palpitations. She does drink a large thing of coffee every morning which is new in the past few months. She also states that her blood pressure seems to go up a little bed with a diastolic in the 03J at times as well. No other recent changes in her medication.     Review Of Systems:  GEN- denies fatigue, fever, weight loss,weakness, recent illness HEENT- denies eye drainage, change in vision, nasal discharge, CVS- denies chest pain, +palpitations RESP- denies SOB, cough, wheeze ABD- denies N/V, change in stools, abd pain GU- denies dysuria, hematuria, dribbling, incontinence MSK- + joint pain, muscle aches, injury Neuro- denies headache, dizziness, syncope, seizure activity       Objective:    BP 136/72 mmHg  Pulse 110  Temp(Src) 98.3 F (36.8 C) (Oral)  Resp 16  Ht 5\' 3"  (1.6 m)  Wt 143 lb (64.864 kg)  BMI 25.34 kg/m2  LMP 06/18/2013 GEN- NAD, alert and oriented x3 HEENT- PERRL, EOMI, non injected sclera, pink conjunctiva, MMM, oropharynx clear Neck- Supple, no thyromegaly CVS- RRR, no murmur , HR  90  RESP-CTAB EXT- No edema Pulses- Radial, DP- 2+  EKG-NSR, atrial enlargement, no ST changes      Assessment & Plan:      Problem List Items Addressed This Visit    None    Visit Diagnoses    Palpitations    -  Primary    Pt not taking BB, start metoprolol 25mg  BID, holter  monitor placed, check lytes, decrease caffiene, continue benicar, EKG non specific    Relevant Orders    EKG 12-Lead (Completed)    CBC with Differential/Platelet    Comprehensive metabolic panel       Note: This dictation was prepared with Dragon dictation along with smaller phrase technology. Any transcriptional errors that result from this process are unintentional.

## 2014-09-10 ENCOUNTER — Ambulatory Visit (INDEPENDENT_AMBULATORY_CARE_PROVIDER_SITE_OTHER): Payer: 59 | Admitting: Otolaryngology

## 2014-09-10 DIAGNOSIS — J343 Hypertrophy of nasal turbinates: Secondary | ICD-10-CM

## 2014-09-10 DIAGNOSIS — J310 Chronic rhinitis: Secondary | ICD-10-CM | POA: Diagnosis not present

## 2014-09-10 LAB — CBC WITH DIFFERENTIAL/PLATELET
BASOS PCT: 0 % (ref 0–1)
Basophils Absolute: 0 10*3/uL (ref 0.0–0.1)
Eosinophils Absolute: 0.2 10*3/uL (ref 0.0–0.7)
Eosinophils Relative: 2 % (ref 0–5)
HCT: 36.6 % (ref 36.0–46.0)
HEMOGLOBIN: 11.9 g/dL — AB (ref 12.0–15.0)
LYMPHS ABS: 2.8 10*3/uL (ref 0.7–4.0)
LYMPHS PCT: 37 % (ref 12–46)
MCH: 30.3 pg (ref 26.0–34.0)
MCHC: 32.5 g/dL (ref 30.0–36.0)
MCV: 93.1 fL (ref 78.0–100.0)
MPV: 10.4 fL (ref 8.6–12.4)
Monocytes Absolute: 0.5 10*3/uL (ref 0.1–1.0)
Monocytes Relative: 7 % (ref 3–12)
NEUTROS PCT: 54 % (ref 43–77)
Neutro Abs: 4.2 10*3/uL (ref 1.7–7.7)
PLATELETS: 207 10*3/uL (ref 150–400)
RBC: 3.93 MIL/uL (ref 3.87–5.11)
RDW: 14.9 % (ref 11.5–15.5)
WBC: 7.7 10*3/uL (ref 4.0–10.5)

## 2014-09-10 LAB — COMPREHENSIVE METABOLIC PANEL
ALT: 21 U/L (ref 0–35)
AST: 19 U/L (ref 0–37)
Albumin: 3.8 g/dL (ref 3.5–5.2)
Alkaline Phosphatase: 75 U/L (ref 39–117)
BILIRUBIN TOTAL: 0.3 mg/dL (ref 0.2–1.2)
BUN: 23 mg/dL (ref 6–23)
CALCIUM: 8.8 mg/dL (ref 8.4–10.5)
CHLORIDE: 105 meq/L (ref 96–112)
CO2: 24 mEq/L (ref 19–32)
CREATININE: 1.4 mg/dL — AB (ref 0.50–1.10)
GLUCOSE: 444 mg/dL — AB (ref 70–99)
Potassium: 4.7 mEq/L (ref 3.5–5.3)
Sodium: 137 mEq/L (ref 135–145)
TOTAL PROTEIN: 6.4 g/dL (ref 6.0–8.3)

## 2014-09-10 NOTE — Assessment & Plan Note (Signed)
Well controlled 

## 2014-09-11 ENCOUNTER — Telehealth: Payer: Self-pay | Admitting: Family Medicine

## 2014-09-11 NOTE — Telephone Encounter (Signed)
Noted  

## 2014-09-11 NOTE — Telephone Encounter (Signed)
-----   Message from Orlena Sheldon, PA-C sent at 09/10/2014 12:41 PM EDT ----- Regarding: RE: Can you look at the [facility] and Creatinine for this pt, she was having palpitations I did speak to her on the phone and discussed lab results. Creatinine stable. CBC stable. Potassium normal. Discussed that the only significant abnormality was that her glucose was very high at 444. Reviewed that her last A1c was fairly well controlled. Patient states that she is taking both of her diabetic medications as directed. She states that yesterday prior to her office visit she did eat at [employer] and ate 2 buttered biscuits with jelly on them. Explained to her importance of dietary compliance. Will let Dr. Buelah Manis know.  ----- Message -----    From: Alycia Rossetti, MD    Sent: 09/10/2014  12:21 AM      To: Orlena Sheldon, PA-C Subject: Can you look at the [facility] and Creatinine for#

## 2014-09-14 ENCOUNTER — Encounter: Payer: Self-pay | Admitting: Family Medicine

## 2014-09-14 ENCOUNTER — Ambulatory Visit (INDEPENDENT_AMBULATORY_CARE_PROVIDER_SITE_OTHER): Payer: 59 | Admitting: Family Medicine

## 2014-09-14 VITALS — BP 102/70 | HR 78 | Temp 98.6°F | Resp 16 | Ht 63.0 in | Wt 143.0 lb

## 2014-09-14 DIAGNOSIS — I952 Hypotension due to drugs: Secondary | ICD-10-CM | POA: Diagnosis not present

## 2014-09-14 DIAGNOSIS — E119 Type 2 diabetes mellitus without complications: Secondary | ICD-10-CM

## 2014-09-14 DIAGNOSIS — I959 Hypotension, unspecified: Secondary | ICD-10-CM | POA: Insufficient documentation

## 2014-09-14 DIAGNOSIS — N183 Chronic kidney disease, stage 3 unspecified: Secondary | ICD-10-CM

## 2014-09-14 LAB — URINALYSIS, ROUTINE W REFLEX MICROSCOPIC
Bilirubin Urine: NEGATIVE
Glucose, UA: NEGATIVE mg/dL
HGB URINE DIPSTICK: NEGATIVE
KETONES UR: NEGATIVE mg/dL
Leukocytes, UA: NEGATIVE
Nitrite: NEGATIVE
Protein, ur: NEGATIVE mg/dL
Specific Gravity, Urine: 1.015 (ref 1.005–1.030)
UROBILINOGEN UA: 0.2 mg/dL (ref 0.0–1.0)
pH: 7 (ref 5.0–8.0)

## 2014-09-14 NOTE — Assessment & Plan Note (Signed)
A1C 5 weeks ago showed great control, unclear if this is dietary changes, no signs of any systemic infection, UA neg Continue Lantus at 20 units at bedtime Due to CKD certain oral meds can not be used Consider one of the GLP-1

## 2014-09-14 NOTE — Progress Notes (Signed)
Patient ID: Kathryn Oconnell, female   DOB: 1965/04/26, 49 y.o.   MRN: [id number]   Subjective:    Patient ID: Kathryn Oconnell, female    DOB: [birth year], 49 y.o.   MRN: [id number]  Patient presents for BP Running Low and F/U BS  patient here for interim follow-up. When I did her labs last week the only thing that was abnormal was her blood glucose of 444 she states that she had not had her medicine and she just ate at [place]. She began checking her sugars more regularly and noticed that her blood sugars have been elevated from 200s up to the upper 300s. Her blood sugar reach 400 on Saturday she called her after hours line she was started on Lantus 10 units and was given instructions to titrate up 1 unit daily for fastings greater than 120 however she does her own self and gave herself 15 units twice a day yesterday and gave herself 10 units this morning. She has not had any hypoglycemia. Her blood sugar this morning was 81 last night 210. She also noticed that her blood pressure has been lower since she started the Toprol she was RD on benazepril. Her blood pressure was 88/76 yesterday with a heart rate of 115 she did not take any medication yesterday. The day before it was 102/88 she did take one tablet of metoprolol. She has not been taking Inderal    Review Of Systems:  GEN- denies fatigue, fever, weight loss,weakness, recent illness HEENT- denies eye drainage, change in vision, nasal discharge, CVS- denies chest pain, palpitations RESP- denies SOB, cough, wheeze ABD- denies N/V, change in stools, abd pain GU- denies dysuria, hematuria, dribbling, incontinence MSK- denies joint pain, muscle aches, injury Neuro- denies headache, dizziness, syncope, seizure activity       Objective:    BP 102/70 mmHg  Pulse 78  Temp(Src) 98.6 F (37 C) (Oral)  Resp 16  Ht 5\' 3"  (1.6 m)  Wt 143 lb (64.864 kg)  BMI 25.34 kg/m2  LMP 06/18/2013 GEN- NAD, alert and oriented x3 HEENT- PERRL, EOMI, non  injected sclera, pink conjunctiva, MMM, oropharynx clear Neck- Supple, no thyromegaly CVS- RRR, no murmur RESP-CTAB ABD-NABS,soft,NT,ND EXT- No edema Pulses- Radial, DP- 2+        Assessment & Plan:      Problem List Items Addressed This Visit    None      Note: This dictation was prepared with Dragon dictation along with smaller phrase technology. Any transcriptional errors that result from this process are unintentional.

## 2014-09-14 NOTE — Patient Instructions (Addendum)
Toprol take 1/2 tablet twice a day  Hold the benezapril Give lantus 20 units at bedtime ( tonight only give 10 units) Check your blood sugar fasting and before meals F/U 2 weeks with meter

## 2014-09-14 NOTE — Assessment & Plan Note (Signed)
Hold benzapril, change metoprolol to 12.5mg  BID Holter monitor returned today Monitor BP and BS at home

## 2014-09-16 ENCOUNTER — Telehealth: Payer: Self-pay | Admitting: *Deleted

## 2014-09-16 NOTE — Telephone Encounter (Signed)
Received Holter Monitor results.   MD reviewed and states that few PAC's noted (extra beats). Recommended to decrease caffeine and continue metoprolol 12.5mg  BID.   Also advised to F/U with BP readings.   Call placed to patient. [place].

## 2014-09-16 NOTE — Telephone Encounter (Signed)
Patient returned call and made aware.   States that her BP has continued to run low. Readings are as follows: 119/82 127/80 106/68 101/72 97/59  Patient requested clarification on metoprolol. Advised that if BP <110/60 to hold medication.   MD to be made aware.

## 2014-09-21 ENCOUNTER — Telehealth: Payer: Self-pay | Admitting: *Deleted

## 2014-09-21 NOTE — Telephone Encounter (Signed)
Received call from patient.   Reports that she is having extreme fluctuations in FSBS and is concerned that insulin may be too much.   States that her fasting CBG this morning was noted at 53, but after she eats, her FSBS ranges up to or above 200.  Reports that her BP has been running stable at 140/90 with HR of 88-90.  Requested to stop Lantus.   MD please advise.

## 2014-09-21 NOTE — Telephone Encounter (Signed)
Patient returned call.   States that she is not comfortable taking Lantus.   Advised that she should take Lantus with snack at night.   States that she is not sure about taking the insulin and she probably will not be using it.   MD made aware.

## 2014-09-21 NOTE — Telephone Encounter (Signed)
Call placed to patient. LMTRC.  

## 2014-09-21 NOTE — Telephone Encounter (Signed)
Decrease lantus to 10 units, do not stop at this time

## 2014-09-28 ENCOUNTER — Other Ambulatory Visit: Payer: Self-pay

## 2014-09-28 ENCOUNTER — Ambulatory Visit: Payer: 59 | Admitting: Family Medicine

## 2014-09-29 ENCOUNTER — Other Ambulatory Visit: Payer: Self-pay | Admitting: *Deleted

## 2014-09-29 MED ORDER — METOPROLOL TARTRATE 25 MG PO TABS: 25.0000 mg | ORAL_TABLET | Freq: Two times a day (BID) | ORAL | Status: DC

## 2014-09-29 NOTE — Telephone Encounter (Signed)
Received call from patient.   Reports that she requires refill on Metoprolol.   Prescription sent to pharmacy.   Appointment scheduled for 10/07/2014.

## 2014-10-07 ENCOUNTER — Encounter: Payer: Self-pay | Admitting: Family Medicine

## 2014-10-07 ENCOUNTER — Ambulatory Visit (INDEPENDENT_AMBULATORY_CARE_PROVIDER_SITE_OTHER): Payer: 59 | Admitting: Family Medicine

## 2014-10-07 VITALS — BP 130/78 | HR 72 | Temp 98.4°F | Resp 14 | Ht 63.0 in | Wt 150.0 lb

## 2014-10-07 DIAGNOSIS — E119 Type 2 diabetes mellitus without complications: Secondary | ICD-10-CM

## 2014-10-07 DIAGNOSIS — E785 Hyperlipidemia, unspecified: Secondary | ICD-10-CM

## 2014-10-07 DIAGNOSIS — I10 Essential (primary) hypertension: Secondary | ICD-10-CM

## 2014-10-07 DIAGNOSIS — E038 Other specified hypothyroidism: Secondary | ICD-10-CM | POA: Diagnosis not present

## 2014-10-07 MED ORDER — METOPROLOL TARTRATE 25 MG PO TABS: 12.5000 mg | ORAL_TABLET | Freq: Two times a day (BID) | ORAL | Status: DC

## 2014-10-07 MED ORDER — HYDROCODONE-ACETAMINOPHEN 10-325 MG PO TABS: 1.0000 | ORAL_TABLET | Freq: Three times a day (TID) | ORAL | Status: DC | PRN

## 2014-10-07 MED ORDER — CHLORHEXIDINE GLUCONATE 0.12 % MT SOLN: 15.0000 mL | Freq: Two times a day (BID) | OROMUCOSAL | Status: DC

## 2014-10-07 NOTE — Progress Notes (Signed)
Patient ID: Kathryn Oconnell, female   DOB: October 10, 1965, 49 y.o.   MRN: [id number]   Subjective:    Patient ID: Kathryn Oconnell, female    DOB: October 28, 1965, 49 y.o.   MRN: [id number]  Patient presents for 2 week F/U  here for interim follow-up on her blood pressure and diabetes mellitus. Since I decreased her metoprolol stopped the benazepril for blood pressure has been normal. She has not had any palpitations that she is also decreased her caffeine. Her 24-hour monitor showed a few PVCs but no other pathologic arrhythmias. Diabetes mellitus her last A1c was actually normal 2 months ago. She's been having a severely elevated blood sugars however the past couple weeks. When she called and a few days ago we advised her to decrease her Lantus that she was having hypoglycemia symptoms however she comes in today telling me she is taking 15 units twice a day because her blood sugars keep going up and down. She is taking her Onglyza and glipizide as well. She changed to a new meter the meter she has been using for the past few weeks shows a 7 day average of 107 and a 30 day average of 171. She states that her blood sugars usually good in the morning but tends to go up throughout the day He requests a refill on her pain medication  Review Of Systems:  GEN- denies fatigue, fever, weight loss,weakness, recent illness HEENT- denies eye drainage, change in vision, nasal discharge, CVS- denies chest pain, palpitations RESP- denies SOB, cough, wheeze ABD- denies N/V, change in stools, abd pain GU- denies dysuria, hematuria, dribbling, incontinence MSK- denies joint pain, muscle aches, injury Neuro- denies headache, dizziness, syncope, seizure activity       Objective:    BP 130/78 mmHg  Pulse 72  Temp(Src) 98.4 F (36.9 C) (Oral)  Resp 14  Ht 5\' 3"  (1.6 m)  Wt 150 lb (68.04 kg)  BMI 26.58 kg/m2  LMP 06/18/2013 GEN- NAD, alert and oriented x3         Assessment & Plan:      Problem List Items  Addressed This Visit    Hypothyroidism   Relevant Medications   metoprolol tartrate (LOPRESSOR) 25 MG tablet   Other Relevant Orders   TSH   T3, free   T4, free   Hyperlipidemia   Relevant Medications   metoprolol tartrate (LOPRESSOR) 25 MG tablet   Other Relevant Orders   Lipid panel   Essential hypertension, benign - Primary   Relevant Medications   metoprolol tartrate (LOPRESSOR) 25 MG tablet   Other Relevant Orders   CBC with Differential/Platelet   Comprehensive metabolic panel   Diabetes mellitus type II, controlled   Relevant Orders   Hemoglobin A1c      Note: This dictation was prepared with Dragon dictation along with smaller phrase technology. Any transcriptional errors that result from this process are unintentional.

## 2014-10-07 NOTE — Assessment & Plan Note (Signed)
Plan to recheck TFT

## 2014-10-07 NOTE — Patient Instructions (Signed)
Decrease insulin to 25 units once a day  COntinue metoprolol at 12.5mg  twice a day  Get labs done after July 26th  F/U 3 months

## 2014-10-07 NOTE — Assessment & Plan Note (Signed)
She continues to change her insulin which makes this very difficult to control, I think it would be best if we could get off the insulin all together, will see what her A1C looks like, as I am concerned stopping it now will cause her sugars to shoot back up to the 300's/ Considering using Trulicity For now she is take 25 units once a day , there is no need to split her insulin dose and she is getting too much

## 2014-10-07 NOTE — Assessment & Plan Note (Addendum)
BP stable today, continue metoprolol 12.5mg  BID , since she is symptomatic with tachycardia and PVC without medication

## 2014-10-13 ENCOUNTER — Telehealth: Payer: Self-pay | Admitting: *Deleted

## 2014-10-13 NOTE — Telephone Encounter (Signed)
Received call from patient.   Reports that since she has been taking the Lantus, her weight gain has been uncontrolled.   Reports that she is currently at 155lbs.   PCP stated at last visit that medication would not be changed until after A1C results (10/27/2014).   Requested to have MD recommend alternative plan.

## 2014-10-13 NOTE — Telephone Encounter (Signed)
NTBS for different plan.  I won't interject in Dr. Dorian Heckle plan without seeing her.

## 2014-10-14 NOTE — Telephone Encounter (Signed)
Call placed to patient to make aware.   Reports her FSBS have been normal, but weight gain is worrisome to her. States that she is concerned about gaining so much weight.   States that she will wait until PCP returns to discuss change in therapy.

## 2014-10-22 ENCOUNTER — Other Ambulatory Visit: Payer: Self-pay | Admitting: Family Medicine

## 2014-10-27 ENCOUNTER — Ambulatory Visit (INDEPENDENT_AMBULATORY_CARE_PROVIDER_SITE_OTHER): Payer: 59 | Admitting: Family Medicine

## 2014-10-27 ENCOUNTER — Telehealth: Payer: Self-pay | Admitting: Family Medicine

## 2014-10-27 ENCOUNTER — Encounter: Payer: Self-pay | Admitting: Family Medicine

## 2014-10-27 VITALS — BP 122/64 | HR 86 | Temp 98.3°F | Resp 14 | Ht 63.0 in | Wt 154.0 lb

## 2014-10-27 DIAGNOSIS — G629 Polyneuropathy, unspecified: Secondary | ICD-10-CM | POA: Diagnosis not present

## 2014-10-27 DIAGNOSIS — L84 Corns and callosities: Secondary | ICD-10-CM

## 2014-10-27 DIAGNOSIS — E119 Type 2 diabetes mellitus without complications: Secondary | ICD-10-CM

## 2014-10-27 DIAGNOSIS — K5900 Constipation, unspecified: Secondary | ICD-10-CM

## 2014-10-27 DIAGNOSIS — I10 Essential (primary) hypertension: Secondary | ICD-10-CM | POA: Diagnosis not present

## 2014-10-27 DIAGNOSIS — K5909 Other constipation: Secondary | ICD-10-CM

## 2014-10-27 LAB — GLUCOSE, FINGERSTICK (STAT): Glucose, fingerstick: 161 mg/dL — ABNORMAL HIGH (ref 70–99)

## 2014-10-27 LAB — HEMOGLOBIN A1C, FINGERSTICK: HEMOGLOBIN A1C, FINGERSTICK: 7.5 % — AB (ref <5.7)

## 2014-10-27 MED ORDER — NALOXEGOL OXALATE 12.5 MG PO TABS: 12.5000 mg | ORAL_TABLET | Freq: Every day | ORAL | Status: DC

## 2014-10-27 MED ORDER — OXYCODONE-ACETAMINOPHEN 7.5-325 MG PO TABS: 1.0000 | ORAL_TABLET | Freq: Three times a day (TID) | ORAL | Status: DC | PRN

## 2014-10-27 MED ORDER — DULAGLUTIDE 0.75 MG/0.5ML ~~LOC~~ SOAJ: SUBCUTANEOUS | Status: DC

## 2014-10-27 NOTE — Assessment & Plan Note (Signed)
Neuropathy with callus bilateral feet. I'm going to refer her back to podiatry think she needs custom orthotics she does not want to wear diabetic shoes

## 2014-10-27 NOTE — Assessment & Plan Note (Signed)
At the visit she asked about any medication to help with her constipation. I'm going to try her on Movantik since she is on chronic opiates

## 2014-10-27 NOTE — Assessment & Plan Note (Signed)
Blood pressure is well controlled we'll continue the current regimen

## 2014-10-27 NOTE — Telephone Encounter (Signed)
MD please advise

## 2014-10-27 NOTE — Patient Instructions (Addendum)
Referral to podiatry  Pain medication changed to percocet Hold the insulin Start the trulicity once a week  Change F/U to 3 months

## 2014-10-27 NOTE — Telephone Encounter (Signed)
Movantik sent to pharmacy - savings card- please mail to her

## 2014-10-27 NOTE — Assessment & Plan Note (Addendum)
Her blood sugar was tested against her home meter. The blood sugar at the office was 161 her home meter read 219 she does have a new meter at home which I recommend she start using. A1c returned at 7.5%. She's been on insulin for about the past 4 weeks. I'm been a try her off of insulin therapy as it is also causing weight gain and she does not want to take any further. We will start Dimmit once a week I've given her samples from the office She will call in 2 weeks with her blood sugar readings

## 2014-10-27 NOTE — Progress Notes (Signed)
Patient ID: Kathryn Oconnell, female   DOB: 1965-09-14, 49 y.o.   MRN: [id number]   Subjective:    Patient ID: Kathryn Oconnell, female    DOB: 17-Apr-1965, 49 y.o.   MRN: [id number]  Patient presents for 3 month F/U  Patient here for interim follow-up on diabetes mellitus. She would like to come off the insulin as it is causing weight gain. She has gained about 10 pounds over the past few weeks. Her blood sugars are much improved her seven-day average is 124. She's not had any hypoglycemia. She's only been injecting Lantus 24 units at bedtime as prescribed. She is also taking her oral medications.  She requests to change her pain medication which we discussed briefly at the last visit. She feels like the hydrocodone is no longer helping her chronic back pain. She would like to try the stronger Percocet.  She needs a new referral to podiatry bilateral calluses they were also trimming her nails in the past. She's had foot surgery in the past   Review Of Systems:  GEN- denies fatigue, fever, weight loss,weakness, recent illness HEENT- denies eye drainage, change in vision, nasal discharge, CVS- denies chest pain, palpitations RESP- denies SOB, cough, wheeze ABD- denies N/V, change in stools, abd pain GU- denies dysuria, hematuria, dribbling, incontinence MSK-+ joint pain, muscle aches, injury Neuro- denies headache, dizziness, syncope, seizure activity       Objective:    BP 122/64 mmHg  Pulse 86  Temp(Src) 98.3 F (36.8 C) (Oral)  Resp 14  Ht 5\' 3"  (1.6 m)  Wt 154 lb (69.854 kg)  BMI 27.29 kg/m2  LMP 06/18/2013 GEN- NAD, alert and oriented x3 Skin- callus bilat feet, great toes, pre-ulcerative callus metarsal arch left foot EXT- No edema Pulses- Radial, DP- 2+        Assessment & Plan:      Problem List Items Addressed This Visit    Peripheral neuropathy    Neuropathy with callus bilateral feet. I'm going to refer her back to podiatry think she needs custom orthotics she  does not want to wear diabetic shoes      Essential hypertension, benign - Primary    Blood pressure is well controlled we'll continue the current regimen      Relevant Orders   CBC with Differential/Platelet   Comprehensive metabolic panel   Diabetes mellitus type II, controlled   Relevant Orders   Hemoglobin A1C, fingerstick   Glucose, Random      Note: This dictation was prepared with Dragon dictation along with smaller phrase technology. Any transcriptional errors that result from this process are unintentional.

## 2014-10-27 NOTE — Telephone Encounter (Signed)
Patient called in states that her and Dr. Buelah Manis discussed something to help her go to the bathroom since she is on pain medication.

## 2014-10-28 ENCOUNTER — Telehealth: Payer: Self-pay | Admitting: Family Medicine

## 2014-10-28 LAB — COMPREHENSIVE METABOLIC PANEL WITH GFR
ALT: 18 U/L (ref 6–29)
AST: 32 U/L (ref 10–35)
Albumin: 3.7 g/dL (ref 3.6–5.1)
Alkaline Phosphatase: 69 U/L (ref 33–115)
BUN: 14 mg/dL (ref 7–25)
CO2: 22 meq/L (ref 20–31)
Calcium: 8.9 mg/dL (ref 8.6–10.2)
Chloride: 105 meq/L (ref 98–110)
Creat: 1.49 mg/dL — ABNORMAL HIGH (ref 0.50–1.10)
Glucose, Bld: 185 mg/dL — ABNORMAL HIGH (ref 70–99)
Potassium: 4.5 meq/L (ref 3.5–5.3)
Sodium: 139 meq/L (ref 135–146)
Total Bilirubin: 0.4 mg/dL (ref 0.2–1.2)
Total Protein: 6.4 g/dL (ref 6.1–8.1)

## 2014-10-28 LAB — CBC WITH DIFFERENTIAL/PLATELET
Basophils Absolute: 0 10*3/uL (ref 0.0–0.1)
Basophils Relative: 0 % (ref 0–1)
Eosinophils Absolute: 0.2 10*3/uL (ref 0.0–0.7)
Eosinophils Relative: 2 % (ref 0–5)
HCT: 34.4 % — ABNORMAL LOW (ref 36.0–46.0)
Hemoglobin: 11.4 g/dL — ABNORMAL LOW (ref 12.0–15.0)
Lymphocytes Relative: 31 % (ref 12–46)
Lymphs Abs: 3.1 10*3/uL (ref 0.7–4.0)
MCH: 30.6 pg (ref 26.0–34.0)
MCHC: 33.1 g/dL (ref 30.0–36.0)
MCV: 92.2 fL (ref 78.0–100.0)
MPV: 9.3 fL (ref 8.6–12.4)
Monocytes Absolute: 0.8 10*3/uL (ref 0.1–1.0)
Monocytes Relative: 8 % (ref 3–12)
Neutro Abs: 5.8 10*3/uL (ref 1.7–7.7)
Neutrophils Relative %: 59 % (ref 43–77)
Platelets: 204 10*3/uL (ref 150–400)
RBC: 3.73 MIL/uL — ABNORMAL LOW (ref 3.87–5.11)
RDW: 15.8 % — ABNORMAL HIGH (ref 11.5–15.5)
WBC: 9.9 10*3/uL (ref 4.0–10.5)

## 2014-10-28 NOTE — Telephone Encounter (Signed)
PA requested for Movantik.  Unable to submit through [facility].  [facility] PA request faxed.  Awaiting response

## 2014-10-28 NOTE — Telephone Encounter (Signed)
Call placed to patient and patient made aware.   Mailed savings card to patient.

## 2014-10-29 NOTE — Telephone Encounter (Signed)
rec'd approval for Movantik  Pt is aware and pharmacy has been faxed.  Approved thru 04/30/2015  File ID [id number]

## 2014-11-02 ENCOUNTER — Telehealth: Payer: Self-pay | Admitting: Family Medicine

## 2014-11-02 NOTE — Telephone Encounter (Signed)
Okay to take lantus 10 units at bedtime if CBG >300, her fasting are improving, continue with the Trulicity

## 2014-11-02 NOTE — Telephone Encounter (Signed)
Patient would like to talk to you regarding her blood sugar and a sinus infection  [phone]

## 2014-11-02 NOTE — Telephone Encounter (Signed)
Call placed to patient and patient made aware.  

## 2014-11-02 NOTE — Telephone Encounter (Signed)
Call placed to patient.   Reports that she has been taking Trulicity and monitoring her FSBS. Readings are as follows:  Fasting After breakfast  Lunch  Bedtime  178  332   196  374 204  280     316  154  293 146       323 (took 10u Lantus) 56- today  Also states that she she increased nasal congestion and ear pain. Advised to use OTC Mucinex or nasal saline for congestion. Advised to increase rest and to increase fluid intake.  States that she is going out of town on [place], but will contact office to F/U if Sx worsen.

## 2014-11-04 ENCOUNTER — Telehealth: Payer: Self-pay | Admitting: Family Medicine

## 2014-11-04 MED ORDER — LEVOFLOXACIN 500 MG PO TABS: 500.0000 mg | ORAL_TABLET | Freq: Every day | ORAL | Status: DC

## 2014-11-04 NOTE — Telephone Encounter (Signed)
Call placed to patient and patient made aware. Verbalized understanding.  

## 2014-11-04 NOTE — Telephone Encounter (Signed)
MD please advise

## 2014-11-04 NOTE — Telephone Encounter (Signed)
Send in levaquin 500mg  x7 days, continue nasal sprays, if not better on return She should call Dr. Benjamine Mola

## 2014-11-04 NOTE — Telephone Encounter (Signed)
fyi patient calling back to check on this again, she is leaving for the beach today

## 2014-11-04 NOTE — Telephone Encounter (Signed)
Patient calling to let you know that her sinus infection is no better would like a call back  [phone] (H)

## 2014-11-10 ENCOUNTER — Other Ambulatory Visit: Payer: Self-pay | Admitting: Family Medicine

## 2014-11-10 NOTE — Telephone Encounter (Signed)
Ok to refill??  Last office visit 10/22/2014.  Last refill 07/29/2014, #2 refills.

## 2014-11-11 NOTE — Telephone Encounter (Signed)
okay

## 2014-11-11 NOTE — Telephone Encounter (Signed)
Medication called to pharmacy. 

## 2014-11-12 ENCOUNTER — Telehealth: Payer: Self-pay | Admitting: *Deleted

## 2014-11-12 NOTE — Telephone Encounter (Signed)
Submitted referral thru [facility] compass for authorization to Dr. Steffanie Rainwater, MD with referral number [id number]  Type of referral: consult and treat  Number of visits: 6  Start Date: 11/12/14  End Date: 05/15/2015  Dx: G62.9- Polyneuropathy,unspecified       L84- Corns and calluses  Copy has been faxed to Dr. Matt Holmes office for review

## 2014-11-20 ENCOUNTER — Telehealth: Payer: Self-pay | Admitting: Family Medicine

## 2014-11-20 MED ORDER — DULAGLUTIDE 0.75 MG/0.5ML ~~LOC~~ SOAJ: SUBCUTANEOUS | Status: DC

## 2014-11-20 NOTE — Telephone Encounter (Signed)
Prescription sent to pharmacy.

## 2014-11-20 NOTE — Telephone Encounter (Signed)
Patient called in requesting a prescription sent to [facility] in [facility] for the trulicity injections. She states she has used the 4 samples we gave her.

## 2014-11-23 ENCOUNTER — Other Ambulatory Visit: Payer: Self-pay | Admitting: Family Medicine

## 2014-11-24 ENCOUNTER — Telehealth: Payer: Self-pay | Admitting: Family Medicine

## 2014-11-24 NOTE — Telephone Encounter (Signed)
Rec'd request for PA for Trulicity.  Request not able to be done thru [facility].  Fax form completed and returned to [employer] for review.

## 2014-11-24 NOTE — Telephone Encounter (Signed)
Medication filled x1 with no refills.   Requires office visit and fasting labs before any further refills can be given.   Letter sent.

## 2014-11-25 NOTE — Telephone Encounter (Addendum)
Pt on her last sample of Trulicity.  Needs me to call insurance and expedite PA.Rec'd denial for Trulicity.  Pt needs history of a three month trial resulting in threapeutic failure, contraindication, or intolerance to TWO of the following  Bydureon, Byetta, Tanzeum, Victoza  Please advise?  [facility] ref# [id number]

## 2014-11-27 ENCOUNTER — Other Ambulatory Visit: Payer: Self-pay | Admitting: Family Medicine

## 2014-11-27 MED ORDER — LIRAGLUTIDE 18 MG/3ML ~~LOC~~ SOPN: PEN_INJECTOR | SUBCUTANEOUS | Status: DC

## 2014-11-27 NOTE — Telephone Encounter (Signed)
RX to pharmacy, left message for pt to call me back

## 2014-11-27 NOTE — Telephone Encounter (Signed)
Call pt change to Victoza, start with 0.6mg  once a day for 1 week, then increase to 1.2mg  This is a daily injection, but will not cause any weight gain Her insurance will not cover Trulicity

## 2014-11-27 NOTE — Telephone Encounter (Signed)
Pt called back and made aware of new prescription and directions

## 2014-12-04 ENCOUNTER — Telehealth: Payer: Self-pay | Admitting: Family Medicine

## 2014-12-04 MED ORDER — HYDROCODONE-ACETAMINOPHEN 10-325 MG PO TABS: 1.0000 | ORAL_TABLET | Freq: Four times a day (QID) | ORAL | Status: DC | PRN

## 2014-12-04 NOTE — Telephone Encounter (Signed)
Patient is calling to say that she would like to be switched back to hydrocodone if possible instead of the oxycodone, she says that the hydrocodone last longer for her pain  [phone]

## 2014-12-04 NOTE — Telephone Encounter (Signed)
MD please advise

## 2014-12-04 NOTE — Telephone Encounter (Signed)
Prescription printed and patient made aware to come to office to pick up.   Patient states that she will pick up prescription on Tuesday, 12/08/2014.

## 2014-12-04 NOTE — Telephone Encounter (Signed)
Okay to chagne back to norco 10-325mg  1 po Q 6 hrs prn #100,

## 2014-12-11 ENCOUNTER — Other Ambulatory Visit: Payer: Self-pay | Admitting: Family Medicine

## 2014-12-11 ENCOUNTER — Telehealth: Payer: Self-pay | Admitting: *Deleted

## 2014-12-11 NOTE — Telephone Encounter (Signed)
Pt has appt scheduled for Sept 29th at 2:50pm with Dr. Benjamine Mola ENT in [place] location, left message on pt's vm to return my call for appt information

## 2014-12-11 NOTE — Telephone Encounter (Signed)
Submitted referral thru [facility] compass to  Dr. Tamsen Roers Teoh,ENT with referral number [id number]  Type of referral: consult and treat  Number of visits:6  Start Date:12/10/04  End date:06/10/15  Dx: J1.01- acute recurrent maxillary sinusitis       J01.80- other acute sinusitis  Copy has been faxed to Dr. Benjamine Mola office [facility] location for records/review

## 2014-12-14 NOTE — Telephone Encounter (Signed)
Ok to refill??  Last office visit 10/29/2014.  Last refill 09/09/2014, #3 refills.

## 2014-12-14 NOTE — Telephone Encounter (Signed)
Pt called back and aware of appt 

## 2014-12-14 NOTE — Telephone Encounter (Signed)
Okay to refill? 

## 2014-12-15 NOTE — Telephone Encounter (Signed)
Medication refilled per protocol. 

## 2014-12-22 ENCOUNTER — Telehealth: Payer: Self-pay | Admitting: Family Medicine

## 2014-12-22 NOTE — Telephone Encounter (Signed)
[phone] Patient is calling to say that she is itching all over but it is not all day would like a call back to see if anything can be called in (I told her to go to urgent care)

## 2015-01-04 ENCOUNTER — Telehealth: Payer: Self-pay | Admitting: *Deleted

## 2015-01-04 NOTE — Telephone Encounter (Signed)
Patient is calling to say that she is itching all over but it is not all day would like something called in if possible, states she has tried benedryl and it does not seems to help, states has had these symptoms for over a week now. MD please advise!  [place]

## 2015-01-04 NOTE — Telephone Encounter (Signed)
Atarax 25mg  TID as needed Schedule OV to see cause

## 2015-01-05 ENCOUNTER — Other Ambulatory Visit: Payer: Self-pay | Admitting: Family Medicine

## 2015-01-05 MED ORDER — HYDROXYZINE HCL 25 MG PO TABS: 25.0000 mg | ORAL_TABLET | Freq: Three times a day (TID) | ORAL | Status: DC | PRN

## 2015-01-05 NOTE — Telephone Encounter (Signed)
Pt aware of medication and already sent to pharmacy and pt also already has appt scheduled for Fri Oct 7.

## 2015-01-07 ENCOUNTER — Other Ambulatory Visit: Payer: Self-pay | Admitting: Family Medicine

## 2015-01-07 NOTE — Telephone Encounter (Signed)
Refill appropriate and filled per protocol. 

## 2015-01-08 ENCOUNTER — Encounter: Payer: Self-pay | Admitting: Family Medicine

## 2015-01-08 ENCOUNTER — Ambulatory Visit (INDEPENDENT_AMBULATORY_CARE_PROVIDER_SITE_OTHER): Payer: 59 | Admitting: Family Medicine

## 2015-01-08 VITALS — BP 128/62 | HR 82 | Temp 97.9°F | Resp 16 | Ht 63.0 in | Wt 151.0 lb

## 2015-01-08 DIAGNOSIS — N183 Chronic kidney disease, stage 3 unspecified: Secondary | ICD-10-CM

## 2015-01-08 DIAGNOSIS — E1122 Type 2 diabetes mellitus with diabetic chronic kidney disease: Secondary | ICD-10-CM | POA: Diagnosis not present

## 2015-01-08 DIAGNOSIS — F419 Anxiety disorder, unspecified: Secondary | ICD-10-CM | POA: Diagnosis not present

## 2015-01-08 DIAGNOSIS — I10 Essential (primary) hypertension: Secondary | ICD-10-CM

## 2015-01-08 DIAGNOSIS — Z23 Encounter for immunization: Secondary | ICD-10-CM | POA: Diagnosis not present

## 2015-01-08 DIAGNOSIS — S61219A Laceration without foreign body of unspecified finger without damage to nail, initial encounter: Secondary | ICD-10-CM

## 2015-01-08 MED ORDER — HYDROCODONE-ACETAMINOPHEN 10-325 MG PO TABS: 1.0000 | ORAL_TABLET | Freq: Four times a day (QID) | ORAL | Status: DC | PRN

## 2015-01-08 MED ORDER — LORAZEPAM 0.5 MG PO TABS: ORAL_TABLET | ORAL | Status: DC

## 2015-01-08 MED ORDER — ZOLPIDEM TARTRATE 10 MG PO TABS: ORAL_TABLET | ORAL | Status: DC

## 2015-01-08 NOTE — Progress Notes (Signed)
Patient ID: Kathryn Oconnell, female   DOB: 02-11-1966, 49 y.o.   MRN: [id number]   Subjective:    Patient ID: Kathryn Oconnell, female    DOB: April 27, 1965, 49 y.o.   MRN: [id number]  Patient presents for 3 month F/U  Pt here for F/U DM- she has not been checking CBG, no hypoglycemia symptoms, last A1C 7.5%   Pruritis- past week she has had itching all over, was using some type of cream for dark marks but has used on and off since August without any problems, never had a rash . Given atarax which helps, but her Ativan works better to stop the rash, so then she was concerned it was her nerves  She has been working overtime on the job and her mother has been sick recently causing her to stress out  Request refill on medications   Due for Flu shot  Injury- Cut her fingers on a glass candle holder, last week- using coconut oil, due for TDAP , did not seek care for this    Review Of Systems:  GEN- denies fatigue, fever, weight loss,weakness, recent illness HEENT- denies eye drainage, change in vision, nasal discharge, CVS- denies chest pain, palpitations RESP- denies SOB, cough, wheeze ABD- denies N/V, change in stools, abd pain GU- denies dysuria, hematuria, dribbling, incontinence MSK- denies joint pain, muscle aches, injury Neuro- denies headache, dizziness, syncope, seizure activity       Objective:    BP 128/62 mmHg  Pulse 82  Temp(Src) 97.9 F (36.6 C) (Oral)  Resp 16  Ht 5\' 3"  (1.6 m)  Wt 151 lb (68.493 kg)  BMI 26.76 kg/m2  LMP 06/18/2013 GEN- NAD, alert and oriented x3 HEENT- PERRL, EOMI, non injected sclera, pink conjunctiva, MMM, oropharynx clear Neck- Supple, no thyromegaly CVS- RRR, no murmur RESP-CTAB ABD-NABS,soft,NT,ND EXT- No edema Pulses- Radial, DP- 2+ Skin- Left hand- middle digit- small laceration- with scab on knucle, small laceeration 4th digit across distal phalenge Otherwise no rash      Assessment & Plan:      Problem List Items Addressed This  Visit    Essential hypertension, benign    Controlled, no changes      Relevant Orders   CBC with Differential/Platelet   Hemoglobin A1c   Diabetes mellitus type II, controlled (HCC)    Goal A1C < 7%, recheck A1C, continue oral meds and injectable - Victoza      Relevant Orders   Hemoglobin A1c   Lipid panel   CKD (chronic kidney disease) stage 3, GFR 30-59 ml/min - Primary   Relevant Orders   Comprehensive metabolic panel   Anxiety    She is experiencing some increased stress with the temporary changes at work and with her mother, continue ativan, possible pruritis is from nerves, no other new meds Atarax will also help with anxiety       Relevant Medications   LORazepam (ATIVAN) 0.5 MG tablet    Other Visit Diagnoses    Need for prophylactic vaccination and inoculation against influenza        Relevant Orders    Flu Vaccine QUAD 36+ mos PF IM (Fluarix & Fluzone Quad PF) (Completed)    Need for prophylactic vaccination with combined diphtheria-tetanus-pertussis (DTP) vaccine        Relevant Orders    Tdap vaccine greater than or equal to 7yo IM (Completed)    Laceration of finger, initial encounter        Now healing with scab, no  sign of infection, will give TDAP       Note: This dictation was prepared with Dragon dictation along with smaller phrase technology. Any transcriptional errors that result from this process are unintentional.

## 2015-01-08 NOTE — Patient Instructions (Signed)
Flu shot and Tetanus shot given Continue current meds Use Ambi Cream Get labs done after the 26th of Oct  F/U 3 months

## 2015-01-10 ENCOUNTER — Encounter: Payer: Self-pay | Admitting: Family Medicine

## 2015-01-10 NOTE — Assessment & Plan Note (Signed)
Controlled, no changes. 

## 2015-01-10 NOTE — Assessment & Plan Note (Signed)
She is experiencing some increased stress with the temporary changes at work and with her mother, continue ativan, possible pruritis is from nerves, no other new meds Atarax will also help with anxiety

## 2015-01-10 NOTE — Assessment & Plan Note (Signed)
Goal A1C < 7%, recheck A1C, continue oral meds and injectable - Victoza

## 2015-01-28 ENCOUNTER — Other Ambulatory Visit: Payer: Self-pay | Admitting: Family Medicine

## 2015-01-29 NOTE — Telephone Encounter (Signed)
Refill appropriate and filled per protocol. 

## 2015-02-03 ENCOUNTER — Other Ambulatory Visit: Payer: Self-pay | Admitting: Family Medicine

## 2015-02-04 NOTE — Telephone Encounter (Signed)
Test strips refilled

## 2015-02-05 ENCOUNTER — Ambulatory Visit: Payer: 59 | Admitting: Family Medicine

## 2015-02-09 ENCOUNTER — Other Ambulatory Visit: Payer: Self-pay | Admitting: Family Medicine

## 2015-02-09 ENCOUNTER — Telehealth: Payer: Self-pay | Admitting: Family Medicine

## 2015-02-09 ENCOUNTER — Ambulatory Visit: Payer: 59 | Admitting: Family Medicine

## 2015-02-09 MED ORDER — HYDROCODONE-ACETAMINOPHEN 10-325 MG PO TABS: 1.0000 | ORAL_TABLET | Freq: Four times a day (QID) | ORAL | Status: DC | PRN

## 2015-02-09 NOTE — Telephone Encounter (Signed)
Okay to refill? 

## 2015-02-09 NOTE — Telephone Encounter (Signed)
Prescription printed and patient made aware to come to office to pick up after 2pm on 02/09/2015.

## 2015-02-09 NOTE — Telephone Encounter (Signed)
Patient states she is going to see someone at [facility] on Friday for her gynecologist issues.  She is requesting refill: Pharmacy:pick up  Medication: HYDROcodone-acetaminophen (NORCO) 10-325 MG tablet [[id number]]       Qty:  Sig:  Physician: Dr. Buelah Manis  Patient's phone number: [phone]

## 2015-02-09 NOTE — Telephone Encounter (Signed)
Ok to refill??  Last office visit/ refill 01/08/2015.

## 2015-02-12 ENCOUNTER — Ambulatory Visit (INDEPENDENT_AMBULATORY_CARE_PROVIDER_SITE_OTHER): Payer: 59 | Admitting: Adult Health

## 2015-02-12 ENCOUNTER — Encounter: Payer: Self-pay | Admitting: Adult Health

## 2015-02-12 VITALS — BP 128/80 | HR 72 | Ht 63.25 in | Wt 145.0 lb

## 2015-02-12 DIAGNOSIS — N951 Menopausal and female climacteric states: Secondary | ICD-10-CM

## 2015-02-12 DIAGNOSIS — A6000 Herpesviral infection of urogenital system, unspecified: Secondary | ICD-10-CM | POA: Diagnosis not present

## 2015-02-12 MED ORDER — VALACYCLOVIR HCL 1 G PO TABS: 1000.0000 mg | ORAL_TABLET | Freq: Every day | ORAL | Status: DC

## 2015-02-12 MED ORDER — ESTRADIOL ACETATE 0.05 MG/24HR VA RING: VAGINAL_RING | VAGINAL | Status: DC

## 2015-02-12 NOTE — Progress Notes (Signed)
Subjective:     Patient ID: Kathryn Oconnell, female   DOB: 1966/01/25, 49 y.o.   MRN: [id number]  HPI [name] is a 49 year old black female in complaining of pain in vagina for about 2 months,has stopped using estrace cream,not having sex,has history of herpes and wants to take meds for suppression.  Review of Systems  Patient denies any headaches, hearing loss, fatigue, blurred vision, shortness of breath, chest pain, abdominal pain, problems with bowel movements, urination, or intercourse(not having sex). No joint pain or mood swings.See HPI for positives. Reviewed past medical,surgical, social and family history. Reviewed medications and allergies.     Objective:   Physical Exam BP 128/80 mmHg  Pulse 72  Ht 5' 3.25" (1.607 m)  Wt 145 lb (65.772 kg)  BMI 25.47 kg/m2  LMP 06/18/2013 Skin warm and dry.Pelvic: external genitalia is normal in appearance no lesions, vagina: pale with loss of moisture and rugae,urethra has no lesions or masses noted, cervix:smooth and atrophic, uterus: normal size, shape and contour, non tender, no masses felt, adnexa: no masses or tenderness noted. Bladder is non tender and no masses felt. Discussed using estrace cream,oral estrogen and she wants to try the ring.She is aware of risk and benefits.    Assessment:     Vaginal dryness/pain History of herpes    Plan:    Continue provera Rx valtrex 1 gm #30 take 1 daily with 12 refills Rx femring .05 mg 1 every 3 months with 1 refill Follow up in 3 months

## 2015-02-12 NOTE — Patient Instructions (Signed)
Estradiol vaginal ring (Femring) What is this medicine? ESTRADIOL (es tra DYE ole) vaginal ring is an insert that contains a female hormone. This medicine helps relieve symptoms of vaginal irritation and dryness that occurs in some women during menopause. This medicine can also help relieve hot flashes. This medicine may be used for other purposes; ask your health care provider or pharmacist if you have questions. What should I tell my health care provider before I take this medicine? They need to know if you have any of these conditions: -abnormal vaginal bleeding -blood vessel disease or blood clots -breast, cervical, endometrial, ovarian, liver, or uterine cancer -dementia -diabetes -gallbladder disease -heart disease or recent heart attack -high blood pressure -high cholesterol -high level of calcium in the blood -hysterectomy -kidney disease -liver disease -migraine headaches -protein C deficiency -protein S deficiency -stroke -systemic lupus erythematosus (SLE) -tobacco smoker -an unusual or allergic reaction to estrogens, other hormones, medicines, foods, dyes, or preservatives -pregnant or trying to get pregnant -breast-feeding How should I use this medicine? This medicine may be inserted by you or your physician. Follow the directions that are included with your prescription. If you are unsure how to insert the ring, contact your doctor or health care professional. The vaginal ring should remain in place for 90 days. After 90 days you should replace your old ring and insert a new one. Do not stop using except on the advice of your doctor or health care professional. Contact your pediatrician regarding the use of this medicine in children. Special care may be needed. A patient package insert for the product will be given with each prescription and refill. Read this sheet carefully each time. The sheet may change frequently. Overdosage: If you think you have taken too much of  this medicine contact a poison control center or emergency room at once. NOTE: This medicine is only for you. Do not share this medicine with others. What if I miss a dose? If you miss a dose, use it as soon as you can. If it is almost time for your next dose, use only that dose. Do not use double or extra doses. What may interact with this medicine? Do not take this medicine with any of the following medications: -aromatase inhibitors like aminoglutethimide, anastrozole, exemestane, letrozole, testolactone, vorozole This medicine may also interact with the following medications: -carbamazepine -certain antibiotics used to treat infections -certain barbiturates used for inducing sleep or treating seizures -grapefruit juice -medicines for fungus infections like itraconazole and ketoconazole -raloxifene or tamoxifen -rifabutin, rifampin, or rifapentine -ritonavir -St. John's Wort This list may not describe all possible interactions. Give your health care provider a list of all the medicines, herbs, non-prescription drugs, or dietary supplements you use. Also tell them if you smoke, drink alcohol, or use illegal drugs. Some items may interact with your medicine. What should I watch for while using this medicine? Visit your doctor or health care professional for regular checks on your progress. You will need a regular breast and pelvic exam and Pap smear while on this medicine. You should also discuss the need for regular mammograms with your health care professional, and follow his or her guidelines for these tests. This medicine can make your body retain fluid, making your fingers, hands, or ankles swell. Your blood pressure can go up. Contact your doctor or health care professional if you feel you are retaining fluid. If you have any reason to think you are pregnant, stop taking this medicine right away and contact  your doctor or health care professional. Smoking increases the risk of getting a  blood clot or having a stroke while you are taking this medicine, especially if you are more than 49 years old. You are strongly advised not to smoke. If you wear contact lenses and notice visual changes, or if the lenses begin to feel uncomfortable, consult your eye doctor or health care professional. This medicine can increase the risk of developing a condition (endometrial hyperplasia) that may lead to cancer of the lining of the uterus. Taking progestins, another hormone drug, with this medicine lowers the risk of developing this condition. Therefore, if your uterus has not been removed (by a hysterectomy), your doctor may prescribe a progestin for you to take together with your estrogen. You should know, however, that taking estrogens with progestins may have additional health risks. You should discuss the use of estrogens and progestins with your health care professional to determine the benefits and risks for you. If you are going to have surgery, you may need to stop taking this medicine. Consult your health care professional for advice before you schedule the surgery. You may bathe or participate in other activities while using this medicine. You do not need to remove the vaginal ring during sexual or other activities unless you are more comfortable doing so. Within the 90-day dosage period, you may remove the vaginal ring, rinse it with clean lukewarm (not hot or boiling) water, and re-insert the ring as needed. What side effects may I notice from receiving this medicine? Side effects that you should report to your doctor or health care professional as soon as possible: -allergic reactions like skin rash, itching or hives, swelling of the face, lips, or tongue -breast tissue changes or discharge -signs and symptoms of a blood clot such as breathing problems; changes in vision; chest pain; severe, sudden headache; pain, swelling, warmth in the leg; trouble speaking; sudden numbness or weakness of  the face, arm or leg -signs and symptoms of infection like fever or chills; vomiting; diarrhea; muscle pain; dizziness; or a red, sunburn-like rash on face and body -signs and symptoms of liver injury like dark yellow or brown urine; general ill feeling or flu-like symptoms; light-colored stools; loss of appetite; nausea; right upper belly pain; unusually weak or tired; yellowing of the eyes or skin -symptoms of bowel blockage like constipation, abdominal swelling, abdominal pain, inability to pass gas or have a bowel movement -symptoms of vaginal infection like itching, irritation or unusual discharge -unusual or increased vaginal bleeding -vaginal pain or soreness, redness, swelling Side effects that usually do not require medical attention (report to your doctor or health care professional if they continue or are bothersome): -breast tenderness -fluid retention -hair loss -headache -nausea -upset stomach -vaginal spotting This list may not describe all possible side effects. Call your doctor for medical advice about side effects. You may report side effects to FDA at 1-800-FDA-1088. Where should I keep my medicine? Keep out of the reach of children. Store at room temperature between 15 and 30 degrees C (59 and 86 degrees F). Throw away any unused medicine after the expiration date. NOTE: This sheet is a summary. It may not cover all possible information. If you have questions about this medicine, talk to your doctor, pharmacist, or health care provider.    2016, Elsevier/Gold Standard. (2014-01-12 15:22:23) Follow up in 3 months

## 2015-02-15 ENCOUNTER — Telehealth: Payer: Self-pay | Admitting: Adult Health

## 2015-02-15 NOTE — Telephone Encounter (Signed)
Spoke with pt. Pt placed the estradiol ring in yesterday. About 1 hour after placing the ring in, she started having nausea, feeling drained, and no energy. I spoke with JAG and she advised to take ring out and place in a plastic bag and put bag in a cool, dry spot in case she wants to try it again. Pt voiced understanding. [place]

## 2015-02-16 ENCOUNTER — Telehealth: Payer: Self-pay | Admitting: Adult Health

## 2015-02-16 MED ORDER — ESTRADIOL 0.1 MG/GM VA CREA: TOPICAL_CREAM | VAGINAL | Status: DC

## 2015-02-16 NOTE — Telephone Encounter (Signed)
Pt requests something for vaginal dryness will rx estrace cream,keep femring may try again later

## 2015-02-28 ENCOUNTER — Other Ambulatory Visit: Payer: Self-pay | Admitting: Family Medicine

## 2015-03-01 NOTE — Telephone Encounter (Signed)
Refill appropriate and filled per protocol. 

## 2015-03-05 ENCOUNTER — Other Ambulatory Visit: Payer: Self-pay | Admitting: Family Medicine

## 2015-03-05 NOTE — Telephone Encounter (Signed)
okay

## 2015-03-05 NOTE — Telephone Encounter (Signed)
Ok to refill??  Last office visit/ refill 01/08/2015. 

## 2015-03-08 ENCOUNTER — Telehealth: Payer: Self-pay | Admitting: *Deleted

## 2015-03-08 MED ORDER — HYDROCODONE-ACETAMINOPHEN 10-325 MG PO TABS: 1.0000 | ORAL_TABLET | Freq: Four times a day (QID) | ORAL | Status: DC | PRN

## 2015-03-08 NOTE — Telephone Encounter (Signed)
Rx script priinted and sent to provider for signature

## 2015-03-08 NOTE — Telephone Encounter (Signed)
OKAY TO REFILL change to quantity #120

## 2015-03-08 NOTE — Telephone Encounter (Signed)
rx called in

## 2015-03-08 NOTE — Telephone Encounter (Signed)
Pt called needing refill on Hydrocodone states would like to have the qty increased if possible, states last time was only 100 and is taking QID, please advise!

## 2015-03-11 ENCOUNTER — Ambulatory Visit (INDEPENDENT_AMBULATORY_CARE_PROVIDER_SITE_OTHER): Payer: Self-pay | Admitting: Otolaryngology

## 2015-03-22 ENCOUNTER — Telehealth: Payer: Self-pay | Admitting: Family Medicine

## 2015-03-22 ENCOUNTER — Other Ambulatory Visit: Payer: Self-pay | Admitting: Family Medicine

## 2015-03-22 NOTE — Telephone Encounter (Signed)
Patient aware and does have lantus and will use and keep appt.

## 2015-03-22 NOTE — Telephone Encounter (Signed)
She should have Lantus at home give 10 units QHS, continue victoza and Ongylyza

## 2015-03-22 NOTE — Telephone Encounter (Signed)
Pt took her BS this AM and it was 472- 2 hours later 372  She had lunch around 1 and at 3:45 it was 552. She does have an appt 03/24/15 but wanted to know if you wanted to change anything before then?

## 2015-03-22 NOTE — Telephone Encounter (Signed)
LMTRC

## 2015-03-23 NOTE — Telephone Encounter (Signed)
Ok to refill??  Last office visit/ refill 01/08/2015, #2 refills.

## 2015-03-23 NOTE — Telephone Encounter (Signed)
Medication called to pharmacy. 

## 2015-03-23 NOTE — Telephone Encounter (Signed)
okay

## 2015-03-24 ENCOUNTER — Encounter: Payer: Self-pay | Admitting: Family Medicine

## 2015-03-24 ENCOUNTER — Ambulatory Visit (INDEPENDENT_AMBULATORY_CARE_PROVIDER_SITE_OTHER): Payer: 59 | Admitting: Family Medicine

## 2015-03-24 ENCOUNTER — Encounter: Payer: Self-pay | Admitting: *Deleted

## 2015-03-24 VITALS — BP 138/82 | HR 86 | Temp 98.4°F | Resp 14 | Ht 63.5 in | Wt 143.0 lb

## 2015-03-24 DIAGNOSIS — E038 Other specified hypothyroidism: Secondary | ICD-10-CM

## 2015-03-24 DIAGNOSIS — T300 Burn of unspecified body region, unspecified degree: Secondary | ICD-10-CM

## 2015-03-24 DIAGNOSIS — N183 Chronic kidney disease, stage 3 unspecified: Secondary | ICD-10-CM

## 2015-03-24 DIAGNOSIS — I10 Essential (primary) hypertension: Secondary | ICD-10-CM

## 2015-03-24 DIAGNOSIS — E1122 Type 2 diabetes mellitus with diabetic chronic kidney disease: Secondary | ICD-10-CM

## 2015-03-24 LAB — CBC WITH DIFFERENTIAL/PLATELET
Basophils Absolute: 0 10*3/uL (ref 0.0–0.1)
Basophils Relative: 0 % (ref 0–1)
EOS ABS: 0.1 10*3/uL (ref 0.0–0.7)
EOS PCT: 1 % (ref 0–5)
HCT: 37.6 % (ref 36.0–46.0)
Hemoglobin: 12.7 g/dL (ref 12.0–15.0)
Lymphocytes Relative: 22 % (ref 12–46)
Lymphs Abs: 2.1 10*3/uL (ref 0.7–4.0)
MCH: 31 pg (ref 26.0–34.0)
MCHC: 33.8 g/dL (ref 30.0–36.0)
MCV: 91.7 fL (ref 78.0–100.0)
MONOS PCT: 9 % (ref 3–12)
MPV: 10.9 fL (ref 8.6–12.4)
Monocytes Absolute: 0.9 10*3/uL (ref 0.1–1.0)
Neutro Abs: 6.5 10*3/uL (ref 1.7–7.7)
Neutrophils Relative %: 68 % (ref 43–77)
PLATELETS: 216 10*3/uL (ref 150–400)
RBC: 4.1 MIL/uL (ref 3.87–5.11)
RDW: 15.6 % — ABNORMAL HIGH (ref 11.5–15.5)
WBC: 9.5 10*3/uL (ref 4.0–10.5)

## 2015-03-24 LAB — COMPREHENSIVE METABOLIC PANEL
ALBUMIN: 3.5 g/dL — AB (ref 3.6–5.1)
ALT: 12 U/L (ref 6–29)
AST: 16 U/L (ref 10–35)
Alkaline Phosphatase: 83 U/L (ref 33–115)
BUN: 11 mg/dL (ref 7–25)
CHLORIDE: 106 mmol/L (ref 98–110)
CO2: 20 mmol/L (ref 20–31)
CREATININE: 1.36 mg/dL — AB (ref 0.50–1.10)
Calcium: 9 mg/dL (ref 8.6–10.2)
Glucose, Bld: 254 mg/dL — ABNORMAL HIGH (ref 70–99)
Potassium: 4 mmol/L (ref 3.5–5.3)
SODIUM: 135 mmol/L (ref 135–146)
Total Bilirubin: 0.4 mg/dL (ref 0.2–1.2)
Total Protein: 6.3 g/dL (ref 6.1–8.1)

## 2015-03-24 LAB — T3, FREE: T3, Free: 1.7 pg/mL — ABNORMAL LOW (ref 2.3–4.2)

## 2015-03-24 LAB — TSH: TSH: 2.795 u[IU]/mL (ref 0.350–4.500)

## 2015-03-24 LAB — LIPID PANEL
Cholesterol: 140 mg/dL (ref 125–200)
HDL: 30 mg/dL — ABNORMAL LOW (ref >=46)
LDL CALC: 92 mg/dL (ref <130)
Total CHOL/HDL Ratio: 4.7 Ratio (ref <=5.0)
Triglycerides: 91 mg/dL (ref <150)
VLDL: 18 mg/dL (ref <30)

## 2015-03-24 LAB — T4, FREE: Free T4: 1.17 ng/dL (ref 0.80–1.80)

## 2015-03-24 LAB — HEMOGLOBIN A1C, FINGERSTICK: HEMOGLOBIN A1C, FINGERSTICK: 13.6 % — AB (ref <5.7)

## 2015-03-24 MED ORDER — HYDROCODONE-ACETAMINOPHEN 10-325 MG PO TABS
1.0000 | ORAL_TABLET | Freq: Four times a day (QID) | ORAL | Status: DC | PRN
Start: 2015-03-24 — End: 2015-05-05

## 2015-03-24 MED ORDER — MUPIROCIN CALCIUM 2 % EX CREA: 1.0000 "application " | TOPICAL_CREAM | Freq: Two times a day (BID) | CUTANEOUS | Status: DC

## 2015-03-24 MED ORDER — INSULIN LISPRO 100 UNIT/ML ~~LOC~~ SOLN
5.0000 [IU] | Freq: Once | SUBCUTANEOUS | Status: AC
  Administered 2015-03-24: 5 [IU] via SUBCUTANEOUS

## 2015-03-24 NOTE — Assessment & Plan Note (Signed)
Blood pressure well controlled

## 2015-03-24 NOTE — Assessment & Plan Note (Signed)
Diabetes is uncontrolled her A1c returned at 13.6%. We have given her 5 units of Humalog. Office her blood sugars in the 300s. I will have her increase her Lantus to 20 units she will continue the Victoza and her oral medications. Diabetes has deteriorated very quickly over the past 4 months her last A1c was 7.5%.

## 2015-03-24 NOTE — Patient Instructions (Addendum)
Continue current medications Lantus increase 20units  Change F/U to 1st week in Feb

## 2015-03-24 NOTE — Progress Notes (Signed)
Patient ID: Kathryn Oconnell, female   DOB: 01-11-1966, 49 y.o.   MRN: [id number]   Subjective:    Patient ID: Kathryn Oconnell, female    DOB: December 30, 1965, 49 y.o.   MRN: [id number]  Patient presents for 3 month F/U and ER F/U  issue here to discuss her diabetes. She was seen in the emergency room on Monday. Her blood sugar that morning was 427 that went up to 500s by the end of her workday. She been having a sinus infection for the past couple weeks. She went to the ER and they transfer her to the emergency room because of her blood sugar. She was given multiple bags of IV fluids as well as short acting insulin which brought her sugar down fairly well. She also started taking the Lantus as I recommended when she had called in earlier that day. She was treated for sinus infection with Rocephin and then sent home with Augmentin which she is currently taking. She has not had any fever no shortness of breath. She did note that her vision was a little blurry the weekend in the setting of her elevated sugars. Her blood sugar this morning was 310 last night for 422 Her last A1c was 7.5% 4 months ago. She also suffered a burn to her right ear just a chemical burn from a relaxer product that she put in her hair about a week ago. The skin is now peeling off and it is tender. She has not put anything on it so far. Besides Vaseline   Review Of Systems:  GEN- denies fatigue, fever, weight loss,weakness, recent illness HEENT- denies eye drainage, +change in vision,+ nasal discharge, CVS- denies chest pain, palpitations RESP- denies SOB, cough, wheeze ABD- denies N/V, change in stools, abd pain Neuro- denies headache, dizziness, syncope, seizure activity       Objective:    BP 138/82 mmHg  Pulse 86  Temp(Src) 98.4 F (36.9 C) (Oral)  Resp 14  Ht 5' 3.5" (1.613 m)  Wt 143 lb (64.864 kg)  BMI 24.93 kg/m2  LMP 06/18/2013 GEN- NAD, alert and oriented x3 HEENT- PERRL, EOMI, non injected sclera, pink  conjunctiva, MMM, oropharynx clear, mild maxillary sinus tenderness  Neck- Supple, no LAD CVS- RRR, no murmur RESP-CTAB Skin- right ear- burn to pinna with scab at center no bleeding, no drainage  EXT- No edema Pulses- Radial 2+        Assessment & Plan:     Given pain medication for Jan.   Problem List Items Addressed This Visit    Hypothyroidism    Recheck thyroid function tests continue Synthroid      Relevant Orders   T3, free   TSH   T4, free   Essential hypertension, benign    Blood pressure well controlled      Relevant Orders   CBC with Differential/Platelet   Comprehensive metabolic panel   Diabetes mellitus type II, controlled ([place])    Diabetes is uncontrolled her A1c returned at 13.6%. We have given her 5 units of Humalog. Office her blood sugars in the 300s. I will have her increase her Lantus to 20 units she will continue the Victoza and her oral medications. Diabetes has deteriorated very quickly over the past 4 months her last A1c was 7.5%.      Relevant Medications   insulin lispro (HUMALOG) injection 5 Units (Completed)   Other Relevant Orders   Hemoglobin A1C, fingerstick (Completed)   CBC with Differential/Platelet  Lipid panel   CKD (chronic kidney disease) stage 3, GFR 30-59 ml/min - Primary   Relevant Orders   Comprehensive metabolic panel    Other Visit Diagnoses    Burn        chemical burn to ear, given bactroban, allergy to sulfa       Note: This dictation was prepared with Dragon dictation along with smaller phrase technology. Any transcriptional errors that result from this process are unintentional.

## 2015-03-24 NOTE — Assessment & Plan Note (Signed)
Recheck thyroid function tests continue Synthroid 

## 2015-03-27 ENCOUNTER — Other Ambulatory Visit: Payer: Self-pay | Admitting: Family Medicine

## 2015-03-30 NOTE — Telephone Encounter (Signed)
Refill appropriate and filled per protocol. 

## 2015-04-12 ENCOUNTER — Ambulatory Visit: Payer: 59 | Admitting: Family Medicine

## 2015-04-22 ENCOUNTER — Ambulatory Visit (INDEPENDENT_AMBULATORY_CARE_PROVIDER_SITE_OTHER): Payer: Self-pay | Admitting: Otolaryngology

## 2015-04-25 ENCOUNTER — Other Ambulatory Visit: Payer: Self-pay | Admitting: Family Medicine

## 2015-04-25 LAB — HEMOGLOBIN A1C: HEMOGLOBIN A1C: 13

## 2015-04-26 MED ORDER — ATORVASTATIN CALCIUM 20 MG PO TABS: 20.0000 mg | ORAL_TABLET | Freq: Every day | ORAL | Status: DC

## 2015-04-26 MED ORDER — HYDROXYZINE HCL 25 MG PO TABS: 25.0000 mg | ORAL_TABLET | Freq: Three times a day (TID) | ORAL | Status: DC | PRN

## 2015-04-26 MED ORDER — ZOLPIDEM TARTRATE 10 MG PO TABS
10.0000 mg | ORAL_TABLET | Freq: Every evening | ORAL | Status: DC | PRN
Start: 2015-04-26 — End: 2015-06-14

## 2015-04-26 NOTE — Telephone Encounter (Signed)
Medication called to pharmacy per MD.  

## 2015-04-26 NOTE — Telephone Encounter (Signed)
Received duplicate requests for Ambien.   This refill denied since medication approved in another message.   Levothyroxine approved.

## 2015-05-05 ENCOUNTER — Ambulatory Visit (INDEPENDENT_AMBULATORY_CARE_PROVIDER_SITE_OTHER): Payer: BLUE CROSS/BLUE SHIELD | Admitting: Family Medicine

## 2015-05-05 ENCOUNTER — Encounter: Payer: Self-pay | Admitting: Family Medicine

## 2015-05-05 VITALS — BP 118/70 | HR 98 | Temp 98.1°F | Resp 20 | Wt 143.0 lb

## 2015-05-05 DIAGNOSIS — G894 Chronic pain syndrome: Secondary | ICD-10-CM

## 2015-05-05 DIAGNOSIS — N183 Chronic kidney disease, stage 3 unspecified: Secondary | ICD-10-CM

## 2015-05-05 DIAGNOSIS — E1122 Type 2 diabetes mellitus with diabetic chronic kidney disease: Secondary | ICD-10-CM

## 2015-05-05 DIAGNOSIS — J329 Chronic sinusitis, unspecified: Secondary | ICD-10-CM

## 2015-05-05 DIAGNOSIS — IMO0002 Reserved for concepts with insufficient information to code with codable children: Secondary | ICD-10-CM

## 2015-05-05 DIAGNOSIS — E1165 Type 2 diabetes mellitus with hyperglycemia: Secondary | ICD-10-CM

## 2015-05-05 MED ORDER — HYDROCODONE-ACETAMINOPHEN 10-325 MG PO TABS: 1.0000 | ORAL_TABLET | Freq: Four times a day (QID) | ORAL | Status: DC | PRN

## 2015-05-05 MED ORDER — LEVOFLOXACIN 500 MG PO TABS: 500.0000 mg | ORAL_TABLET | Freq: Every day | ORAL | Status: DC

## 2015-05-05 NOTE — Patient Instructions (Addendum)
F/U 2 months for labs and recheck Stop the victoza , stop augmentin Start levaquin  15 units Twice a day, if blood sugar below 80 in the morning do not take in the morning, only pills with meals

## 2015-05-05 NOTE — Assessment & Plan Note (Addendum)
Controlled diabetes mellitus. She continues to switch her insulin she states that she feels it doesn't fluctuate as she gets it twice a day despite this being a 24 hour release insulin. I will have her continue the 15 units twice a day of the Lantus I will discontinue Victoza she will continue her other oral medications. She is going to call us with her blood sugars if they start to spike.  she is having hypoglycemia in the mornings that she is to hold her morning shot and take her oral medications with meals

## 2015-05-05 NOTE — Progress Notes (Signed)
Patient ID: Kathryn Oconnell, female   DOB: Nov 03, 1965, 50 y.o.   MRN: [id number]   Subjective:    Patient ID: Kathryn Oconnell, female    DOB: 1966/01/22, 50 y.o.   MRN: [id number]  Patient presents for Follow-up and Medication Refill  patient here for interim follow-up on diabetes mellitus. Her A1c returned at 13.6% in December I increase her Lantus 20 units she is also on Victoza Glipizide and ongylyza.   Her fasting blood sugars have ranged  70s 170 fasting. She is not taking on him on a regular basis. She has been giving 15 units twice a day states her blood sugars going up higher which is why she went to the twice a day she thinks this works better for her. She had a couple episodes of hypoglycemia but states that she has been eating on a regular basis.   She was briefly seen at the urgent care with another sinus infection she was given Augmentin she has been on this for the past 6 days as it does not seem to be improving her symptoms. She does have ear nose and throat but missed that appointment a couple weeks ago. She feels like she needs something stronger to help with her sinus symptoms. She is doing her nasal rinses.     Review Of Systems:  GEN- + fatigue, fever, weight loss,weakness, recent illness HEENT- denies eye drainage, change in vision, +nasal discharge, CVS- denies chest pain, palpitations RESP- denies SOB, cough, wheeze ABD- denies N/V, change in stools, abd pain Neuro- denies headache, dizziness, syncope, seizure activity       Objective:    BP 118/70 mmHg  Pulse 98  Temp(Src) 98.1 F (36.7 C) (Oral)  Resp 20  Wt 143 lb (64.864 kg)  LMP 06/18/2013 GEN- NAD, alert and oriented x3 HEENT- PERRL, EOMI, non injected sclera, pink conjunctiva, MMM, oropharynx mild injection, TM clear bilat no effusion,  + maxillary sinus tenderness, inflammed turbinates,  Nasal drainage  Neck- Supple, no LAD CVS- RRR, no murmur RESP-CTAB Pulses- Radial 2+         Assessment &  Plan:      Problem List Items Addressed This Visit    Diabetes mellitus type II, uncontrolled ([place])     Controlled diabetes mellitus. She continues to switch her insulin she states that she feels it doesn't fluctuate as she gets it twice a day despite this being a 24 hour release insulin. I will have her continue the 15 units twice a day of the Lantus I will discontinue Victoza she will continue her other oral medications. She is going to call us with her blood sugars if they start to spike.  she is having hypoglycemia in the mornings that she is to hold her morning shot and take her oral medications with meals      CKD (chronic kidney disease) stage 3, GFR 30-59 ml/min - Primary   Chronic pain syndrome     Pain medications refilled       Other Visit Diagnoses    Recurrent sinus infections         recurrent infections. She sees ENT. Advised her to make a follow-up. Discontinue Augmentin and Levaquin for 7 days    Relevant Medications    levofloxacin (LEVAQUIN) 500 MG tablet       Note: This dictation was prepared with Dragon dictation along with smaller phrase technology. Any transcriptional errors that result from this process are unintentional.

## 2015-05-05 NOTE — Assessment & Plan Note (Signed)
Pain medications refilled.

## 2015-05-17 ENCOUNTER — Ambulatory Visit (INDEPENDENT_AMBULATORY_CARE_PROVIDER_SITE_OTHER): Payer: BLUE CROSS/BLUE SHIELD | Admitting: Adult Health

## 2015-05-17 ENCOUNTER — Encounter: Payer: Self-pay | Admitting: Adult Health

## 2015-05-17 VITALS — BP 128/70 | HR 80 | Ht 64.0 in | Wt 146.0 lb

## 2015-05-17 DIAGNOSIS — R234 Changes in skin texture: Secondary | ICD-10-CM | POA: Diagnosis not present

## 2015-05-17 DIAGNOSIS — N951 Menopausal and female climacteric states: Secondary | ICD-10-CM

## 2015-05-17 MED ORDER — CLOBETASOL PROP EMOLLIENT BASE 0.05 % EX CREA: TOPICAL_CREAM | CUTANEOUS | Status: DC

## 2015-05-17 NOTE — Progress Notes (Signed)
Subjective:     Patient ID: Kathryn Oconnell, female   DOB: 24-Apr-1965, 50 y.o.   MRN: [id number]  HPI Kathryn Oconnell is a 50 year old black female in with persistent vaginal dryness, but says estrace cream is irritating, so she stopped and is using replens,she also stopped femring same day for burning.  Review of Systems Patient denies any headaches, hearing loss, fatigue, blurred vision, shortness of breath, chest pain, abdominal pain, problems with bowel movements, urination, or intercourse(not having sex). No joint pain or mood swings.+ vaginal dryness. Reviewed past medical,surgical, social and family history. Reviewed medications and allergies.     Objective:   Physical Exam BP 128/70 mmHg  Pulse 80  Ht 5\' 4"  (1.626 m)  Wt 146 lb (66.225 kg)  BMI 25.05 kg/m2  LMP 06/18/2013 Skin warm and dry.Pelvic: external genitalia is normal in appearance, vagina: is pale with loss of moisture and rugae, as gray oval lesion at 5 o'clock in introitus on left labia, raised, non tender but she says feels irritated.    Assessment:     Vaginal dryness Changes in skin texture     Plan:     Rx temovate cream 0.05% cream use bid #30 gm with 1 refill Try luvena for vaginal moisture  Follow up in 4 weeks, if not better may biopsy

## 2015-05-17 NOTE — Patient Instructions (Signed)
Use temovate bid Try luvena Follow up in 4 weeks

## 2015-05-29 ENCOUNTER — Other Ambulatory Visit: Payer: Self-pay | Admitting: Family Medicine

## 2015-05-31 ENCOUNTER — Telehealth: Payer: Self-pay | Admitting: Family Medicine

## 2015-05-31 NOTE — Telephone Encounter (Signed)
Patient would like to talk to you regarding the medication that was discussed about a burn medicine with sulfer in it  About the burn on her eyelid  And would like to talk to you regarding her eyelids period  [employer]   [phone]

## 2015-05-31 NOTE — Telephone Encounter (Signed)
Medication refilled per protocol. 

## 2015-05-31 NOTE — Telephone Encounter (Signed)
LMTRC

## 2015-06-01 ENCOUNTER — Other Ambulatory Visit: Payer: Self-pay | Admitting: Adult Health

## 2015-06-01 MED ORDER — ZOLPIDEM TARTRATE ER 12.5 MG PO TBCR: 12.5000 mg | EXTENDED_RELEASE_TABLET | Freq: Every evening | ORAL | Status: DC | PRN

## 2015-06-01 NOTE — Telephone Encounter (Signed)
Pt called back and states that she has a burn like on face under eye and eye lid, feels like a sunburn and wants something called in for it, did state that she is NOT allergic to topical creams that has sulfa in it, says it is not going away. Please advise!  Pt also wants to know if you can prescribe AMBIEN XR/CR (?) that is stronger than the regular says its not helping her now.   [facility] [place]

## 2015-06-01 NOTE — Telephone Encounter (Signed)
She has to use Bacitracin which is over the counter for her face, but this may not be a burn to her face, if this is the same thing from 2 months ago- and she can go to Dermatology if needed, as I would not recommend anything else on her face    You can change to AMbien XR once a day

## 2015-06-01 NOTE — Telephone Encounter (Signed)
Pt aware of message and stated that she will find a Dermatologist in [place] where she works.   Ambien XR filled

## 2015-06-05 ENCOUNTER — Other Ambulatory Visit: Payer: Self-pay | Admitting: Family Medicine

## 2015-06-07 MED ORDER — HYDROCODONE-ACETAMINOPHEN 10-325 MG PO TABS: 1.0000 | ORAL_TABLET | Freq: Four times a day (QID) | ORAL | Status: DC | PRN

## 2015-06-07 NOTE — Telephone Encounter (Signed)
LRF 05/05/15 #120  LOV 05/05/15  OK refill?

## 2015-06-07 NOTE — Telephone Encounter (Signed)
okay

## 2015-06-07 NOTE — Telephone Encounter (Signed)
Duplicate request

## 2015-06-07 NOTE — Telephone Encounter (Signed)
RX ready and pt made aware through [facility]

## 2015-06-14 ENCOUNTER — Encounter: Payer: Self-pay | Admitting: Adult Health

## 2015-06-14 ENCOUNTER — Ambulatory Visit (INDEPENDENT_AMBULATORY_CARE_PROVIDER_SITE_OTHER): Payer: BLUE CROSS/BLUE SHIELD | Admitting: Adult Health

## 2015-06-14 VITALS — BP 116/80 | HR 76 | Ht 64.0 in | Wt 148.5 lb

## 2015-06-14 DIAGNOSIS — R234 Changes in skin texture: Secondary | ICD-10-CM | POA: Diagnosis not present

## 2015-06-14 NOTE — Progress Notes (Signed)
Subjective:     Patient ID: Kathryn Oconnell, female   DOB: 1966-03-28, 49 y.o.   MRN: [id number]  HPI Kathryn Oconnell is a 50 year old black female back in follow up of using temovate to area at introitus,that has skin and color changes.She has no complaints today.   Review of Systems Patient denies any headaches, hearing loss, fatigue, blurred vision, shortness of breath, chest pain, abdominal pain, problems with bowel movements, urination, or intercourse(not having sex). No joint pain or mood swings. Reviewed past medical,surgical, social and family history. Reviewed medications and allergies.     Objective:   Physical Exam BP 116/80 mmHg  Pulse 76  Ht 5\' 4"  (1.626 m)  Wt 148 lb 8 oz (67.359 kg)  BMI 25.48 kg/m2  LMP 06/18/2013 Skin warm and dry, area at 5 o'clock at introitus has smoothed out and color is less gray and is more pink and does not feel irritated like before.    Assessment:     Changes in skin texture    Plan:       Can is temovate 2-3 x weekly Follow up in 3 months or before if needed

## 2015-06-14 NOTE — Patient Instructions (Signed)
Can use temovate 2-3 x weekly now and recheck in 3 months

## 2015-06-25 ENCOUNTER — Other Ambulatory Visit: Payer: Self-pay | Admitting: Family Medicine

## 2015-06-25 ENCOUNTER — Other Ambulatory Visit: Payer: Self-pay | Admitting: Adult Health

## 2015-06-25 ENCOUNTER — Other Ambulatory Visit (HOSPITAL_COMMUNITY)
Admission: RE | Admit: 2015-06-25 | Discharge: 2015-06-25 | Disposition: A | Discharge status: Home / Self Care | Payer: BLUE CROSS/BLUE SHIELD | Source: Ambulatory Visit | Attending: Adult Health | Admitting: Adult Health

## 2015-06-25 ENCOUNTER — Ambulatory Visit (INDEPENDENT_AMBULATORY_CARE_PROVIDER_SITE_OTHER): Payer: BLUE CROSS/BLUE SHIELD | Admitting: Adult Health

## 2015-06-25 ENCOUNTER — Encounter: Payer: Self-pay | Admitting: Adult Health

## 2015-06-25 VITALS — BP 128/88 | HR 82 | Ht 64.0 in | Wt 150.0 lb

## 2015-06-25 DIAGNOSIS — Z01411 Encounter for gynecological examination (general) (routine) with abnormal findings: Secondary | ICD-10-CM

## 2015-06-25 DIAGNOSIS — Z01419 Encounter for gynecological examination (general) (routine) without abnormal findings: Secondary | ICD-10-CM | POA: Diagnosis present

## 2015-06-25 DIAGNOSIS — G479 Sleep disorder, unspecified: Secondary | ICD-10-CM | POA: Diagnosis not present

## 2015-06-25 DIAGNOSIS — Z1151 Encounter for screening for human papillomavirus (HPV): Secondary | ICD-10-CM | POA: Insufficient documentation

## 2015-06-25 DIAGNOSIS — N9089 Other specified noninflammatory disorders of vulva and perineum: Secondary | ICD-10-CM | POA: Diagnosis not present

## 2015-06-25 DIAGNOSIS — Z1212 Encounter for screening for malignant neoplasm of rectum: Secondary | ICD-10-CM | POA: Diagnosis not present

## 2015-06-25 HISTORY — DX: Sleep disorder, unspecified: G47.9

## 2015-06-25 LAB — HEMOCCULT GUIAC POC 1CARD (OFFICE): Fecal Occult Blood, POC: NEGATIVE

## 2015-06-25 NOTE — Telephone Encounter (Signed)
Ok to refill??  Last office visit 05/11/2015.  Last refill 03/23/2015, #2 refills.

## 2015-06-25 NOTE — Patient Instructions (Signed)
Physical in 1 year, pap in 3 if normal Mammogram yearly Colonoscopy advised Labs PCP Return in 1 week for removal of labial lesion with JVF Follow up [place] about sleep

## 2015-06-25 NOTE — Telephone Encounter (Signed)
Okay to refill? 

## 2015-06-25 NOTE — Telephone Encounter (Signed)
Medication called to pharmacy. 

## 2015-06-25 NOTE — Progress Notes (Signed)
Patient ID: Kathryn Oconnell, female   DOB: 1965/08/29, 50 y.o.   MRN: [id number] History of Present Illness: Kathryn Oconnell is a 50 year old black female in for well woman gyn exam and pap, and she complains of sore area left labia, has taken valtrex with no relief and trouble going to sleep has taken ativan,ambein and benadryl and atarax and will finally go to sleep. PCP is Dr Buelah Manis    Current Medications, Allergies, Past Medical History, Past Surgical History, Family History and Social History were reviewed in [employer] record.     Review of Systems: Patient denies any headaches, hearing loss, fatigue, blurred vision, shortness of breath, chest pain, abdominal pain, problems with bowel movements, urination, or intercourse(not currently having sex). No joint pain or mood swings.Has sore area left inner labia and has trouble going to sleep.    Physical Exam:BP 128/88 mmHg  Pulse 82  Ht 5\' 4"  (1.626 m)  Wt 150 lb (68.04 kg)  BMI 25.73 kg/m2  LMP 06/18/2013 General:  Well developed, well nourished, no acute distress Skin:  Warm and dry Neck:  Midline trachea, normal thyroid, good ROM, no lymphadenopathy,no carotid bruits heard Lungs; Clear to auscultation bilaterally Breast:  No dominant palpable mass, retraction, or nipple discharge Cardiovascular: Regular rate and rhythm Abdomen:  Soft, non tender, no hepatosplenomegaly Pelvic:  External genitalia is normal in appearance, except has gray oval 1 cm lesion left inner labia, is sore when touched.  The vagina has decreased color, moisture and rugae. Urethra has no lesions or masses. The cervix is smooth, stenotic os, pap with HPV performed.  Uterus is felt to be normal size, shape, and contour.  No adnexal masses or tenderness noted.Bladder is non tender, no masses felt. Rectal: Good sphincter tone, no polyps, or hemorrhoids felt.  Hemoccult negative. Extremities/musculoskeletal:  No swelling or varicosities noted, no clubbing  or cyanosis Psych:  No mood changes, alert and cooperative,seems happy   Impression: Well woman gyn exam and pap Labial lesion Trouble sleeping     Plan: Return in 1 week for labial lesion removal Physical in 1 year, pap in 3 Mammogram yearly Labs with PCP Colonoscopy advised Follow up with Dr Buelah Manis about sleep Can use temovate on area til sees Dr Glo Herring for removal

## 2015-06-29 LAB — CYTOLOGY - PAP

## 2015-07-02 ENCOUNTER — Ambulatory Visit (INDEPENDENT_AMBULATORY_CARE_PROVIDER_SITE_OTHER): Payer: BLUE CROSS/BLUE SHIELD | Admitting: Obstetrics and Gynecology

## 2015-07-02 ENCOUNTER — Encounter: Payer: Self-pay | Admitting: Obstetrics and Gynecology

## 2015-07-02 ENCOUNTER — Other Ambulatory Visit: Payer: Self-pay | Admitting: Obstetrics and Gynecology

## 2015-07-02 VITALS — BP 140/84 | Ht 64.0 in | Wt 152.0 lb

## 2015-07-02 DIAGNOSIS — N907 Vulvar cyst: Secondary | ICD-10-CM

## 2015-07-02 DIAGNOSIS — N9089 Other specified noninflammatory disorders of vulva and perineum: Secondary | ICD-10-CM | POA: Insufficient documentation

## 2015-07-02 NOTE — Progress Notes (Signed)
Patient ID: Kathryn Oconnell, female   DOB: 25-Jan-1966, 50 y.o.   MRN: [id number] Pt here today for a vulvar biopsy. Pt states that she has a grey area on her "lip" that Anderson Malta was concerned with.

## 2015-07-02 NOTE — Addendum Note (Signed)
Addended by: Farley Ly on: 07/02/2015 12:41 PM   Modules accepted: Orders

## 2015-07-02 NOTE — Progress Notes (Signed)
Patient ID: Kathryn Oconnell, female   DOB: [birth year], 50 y.o.   MRN: [id number]  Chief Complaint  Patient presents with  . Biopsy    vulvar biopsy    HPI Kathryn Oconnell is a 50 y.o. female.  Referred by Smitty Cords for vulva biopsy.   HPI Indication: pruritic  and white lesion of the vulva unresponsive to topical steroid tx. Symptoms:   pruritic and not painful  Location:  labia majora bilaterally  Past Medical History  Diagnosis Date  . Hypertension   . Diabetes mellitus without complication ([place])   . Hyperlipidemia   . Hypothyroid   . Tobacco use   . Lung nodule   . COPD (chronic obstructive pulmonary disease) ([place])   . HSV-2 infection   . Depression   . Anxiety   . Graves disease   . Neuropathy ([place])   . Migraines   . Vaginal discharge 06/03/2013  . BV (bacterial vaginosis) 06/03/2013  . Unspecified symptom associated with female genital organs 06/03/2013  . Yeast infection 06/17/2013  . LUQ pain 02/18/2014  . LLQ pain 02/18/2014  . Weight loss 02/18/2014  . Menopausal vaginal dryness 02/24/2014  . Chronic kidney disease   . Eczema   . Labial lesion 06/25/2015  . Trouble in sleeping 06/25/2015    Past Surgical History  Procedure Laterality Date  . Cesarean section    . Thyroid surgery    . Foot surgery      5th digit left foot / Great toe, 2ND TOE    Family History  Problem Relation Age of Onset  . Stroke Mother     x 3  . Heart disease Mother   . Cancer Mother     breast   . Thyroid disease Mother   . Hyperlipidemia Mother   . Heart disease Father   . Cancer Father     stomach cancer   . Hypertension Father   . Heart attack Father   . Gout Brother   . Migraines Son   . Hypertension Paternal Uncle   . Heart disease Maternal Grandmother   . Heart attack Maternal Grandmother   . Heart disease Maternal Grandfather   . Heart attack Maternal Grandfather   . Cancer Paternal Grandmother   . Diabetes Paternal Grandfather   . Asthma Daughter     Social  History Social History  Substance Use Topics  . Smoking status: Current Every Day Smoker -- 0.50 packs/day for 33 years    Types: Cigarettes  . Smokeless tobacco: Never Used  . Alcohol Use: No    Allergies  Allergen Reactions  . Erythromycin   . Imitrex [Sumatriptan]   . Sulfa Antibiotics Other (See Comments)    Causes stomach cramps  . Topamax [Topiramate]     Nausea and worsening    Current Outpatient Prescriptions  Medication Sig Dispense Refill  . ACCU-CHEK SOFTCLIX LANCETS lancets Test once daily 100 each 2  . adapalene (DIFFERIN) 0.1 % gel 2 (two) times daily.   0  . albuterol (PROVENTIL HFA;VENTOLIN HFA) 108 (90 BASE) MCG/ACT inhaler Inhale 2 puffs into the lungs every 6 (six) hours as needed for wheezing or shortness of breath. 1 Inhaler 0  . aspirin 81 MG tablet Take 81 mg by mouth daily.    Marland Kitchen atorvastatin (LIPITOR) 20 MG tablet Take 1 tablet (20 mg total) by mouth daily. 90 tablet 0  . Blood Glucose Monitoring Suppl (ACCU-CHEK AVIVA) device Use as instructed 1 each 0  .  chlorhexidine (PERIDEX) 0.12 % solution Use as directed 15 mLs in the mouth or throat 2 (two) times daily. (Patient taking differently: Use as directed 15 mLs in the mouth or throat as needed. ) 120 mL 2  . Clobetasol Prop Emollient Base 0.05 % emollient cream Use bid to affected area (Patient taking differently: Use 2-3 times weekly) 30 g 1  . fluticasone (FLONASE) 50 MCG/ACT nasal spray Place 2 sprays into the nose daily. For sinuses/allergies 16 g 3  . glipiZIDE (GLUCOTROL) 10 MG tablet TAKE ONE TABLET BY MOUTH TWICE DAILY BEFORE MEALS 180 tablet 6  . glucose blood (ACCU-CHEK SMARTVIEW) test strip Use to monitor FSBS 2x daily d/t fluctuating blood sugar. Dx: E11.9 100 each 11  . HYDROcodone-acetaminophen (NORCO) 10-325 MG tablet Take 1 tablet by mouth every 6 (six) hours as needed. 120 tablet 0  . hydrOXYzine (ATARAX/VISTARIL) 25 MG tablet Take 1 tablet (25 mg total) by mouth 3 (three) times daily as  needed. 90 tablet 0  . ibuprofen (ADVIL,MOTRIN) 800 MG tablet TAKE ONE TABLET BY MOUTH EVERY 8 HOURS AS NEEDED 45 tablet 3  . LANTUS SOLOSTAR 100 UNIT/ML Solostar Pen INJECT 10  TO 50 UNITS DAILY OR AS DIRECTED 15 pen 3  . levothyroxine (SYNTHROID, LEVOTHROID) 88 MCG tablet Take 1 tablet (88 mcg total) by mouth daily. 90 tablet 3  . LORazepam (ATIVAN) 0.5 MG tablet TAKE ONE TABLET BY MOUTH EVERY 8 HOURS AS NEEDED FOR ANXIETY FOR 30 DAYS 90 tablet 2  . medroxyPROGESTERone (PROVERA) 2.5 MG tablet TAKE ONE TABLET BY MOUTH ONCE DAILY 30 tablet 3  . metoprolol tartrate (LOPRESSOR) 25 MG tablet TAKE ONE TABLET BY MOUTH TWICE DAILY (Patient taking differently: TAKE 1/2 TABLET BY MOUTH TWICE DAILY) 180 tablet 0  . ONE TOUCH ULTRA TEST test strip USE TO CHECK BLOOD SUGAR TWICE DAILY 100 each 11  . ONGLYZA 5 MG TABS tablet TAKE ONE TABLET BY MOUTH ONCE DAILY 90 tablet 0  . RELION MINI PEN NEEDLES 31G X 6 MM MISC USE AS DIRECTED 50 each 5  . triamcinolone cream (KENALOG) 0.1 % 1 application 2 (two) times daily.   0  . valACYclovir (VALTREX) 1000 MG tablet Take 1 tablet (1,000 mg total) by mouth daily. (Patient taking differently: Take 1,000 mg by mouth as needed. ) 30 tablet 12  . zolpidem (AMBIEN CR) 12.5 MG CR tablet Take 1 tablet (12.5 mg total) by mouth at bedtime as needed for sleep. 30 tablet 0   No current facility-administered medications for this visit.    Review of Systems Review of Systems  Blood pressure 140/84, height 5\' 4"  (1.626 m), weight 152 lb (68.947 kg), last menstrual period 06/18/2013.  Physical Exam Physical Exam  Constitutional: She appears well-developed and well-nourished.  Eyes: Pupils are equal, round, and reactive to light.  Abdominal: Soft.  Genitourinary: Vagina normal.  Vulva with 2 "kissing " lesions of posterior l. Majora at 4, 8 oclock  excised completely Data Reviewed Note, hx exam  Assessment vulvar lesions bilateral.    Prepping with Betadine Local  anesthesia with 1% Buffered Lidocaine excisonal 10  mm  biopsy performed per protocol Silver Nitrate applied:  No:  Well tolerated single suture in each side 4-0 prolene  Specimen(2) appropriately identified and sent to pathology    Plan    Follow-up:  5 days for suture removal      Charlena Haub V 07/02/2015, 9:42 AM

## 2015-07-05 ENCOUNTER — Ambulatory Visit (INDEPENDENT_AMBULATORY_CARE_PROVIDER_SITE_OTHER): Payer: BLUE CROSS/BLUE SHIELD | Admitting: Family Medicine

## 2015-07-05 ENCOUNTER — Encounter: Payer: Self-pay | Admitting: Family Medicine

## 2015-07-05 VITALS — BP 138/78 | HR 68 | Temp 98.6°F | Resp 14 | Ht 64.0 in | Wt 153.0 lb

## 2015-07-05 DIAGNOSIS — E1165 Type 2 diabetes mellitus with hyperglycemia: Secondary | ICD-10-CM

## 2015-07-05 DIAGNOSIS — N183 Chronic kidney disease, stage 3 unspecified: Secondary | ICD-10-CM

## 2015-07-05 DIAGNOSIS — I10 Essential (primary) hypertension: Secondary | ICD-10-CM | POA: Diagnosis not present

## 2015-07-05 DIAGNOSIS — E1122 Type 2 diabetes mellitus with diabetic chronic kidney disease: Secondary | ICD-10-CM

## 2015-07-05 DIAGNOSIS — B029 Zoster without complications: Secondary | ICD-10-CM | POA: Diagnosis not present

## 2015-07-05 DIAGNOSIS — IMO0002 Reserved for concepts with insufficient information to code with codable children: Secondary | ICD-10-CM

## 2015-07-05 LAB — HEMOGLOBIN A1C, FINGERSTICK: Hgb A1C (fingerstick): 7.5 % — ABNORMAL HIGH (ref <5.7)

## 2015-07-05 MED ORDER — HYDROCODONE-ACETAMINOPHEN 10-325 MG PO TABS: 1.0000 | ORAL_TABLET | Freq: Four times a day (QID) | ORAL | Status: DC | PRN

## 2015-07-05 NOTE — Patient Instructions (Signed)
Try Tucks Take Anti-viral three times a day for 7 days We will call with lab results F/U 3 months

## 2015-07-05 NOTE — Assessment & Plan Note (Signed)
Well controlled,  

## 2015-07-05 NOTE — Assessment & Plan Note (Signed)
Small ulcerations seen can be beginning of outbreak, will give Valtrex 1000mg  TID for 7 days Can also use tucks pads

## 2015-07-05 NOTE — Assessment & Plan Note (Signed)
A1C in office 7.5% which is great improvement, I do not think she is eating enough to sustain her insulin therefore having some hypoglycemia symptoms. Discussed how the insulin works and best to dose at one time. But she can not skip meals Continue Lantus 30 units, glipizide, Onglyza

## 2015-07-05 NOTE — Progress Notes (Signed)
Patient ID: Kathryn Oconnell, female   DOB: 09/20/1965, 50 y.o.   MRN: [id number]    Subjective:    Patient ID: Kathryn Oconnell, female    DOB: 1966-01-14, 50 y.o.   MRN: [id number]  Patient presents for 2 month F/U and Anal Lesion Patient here to follow-up chronic medical problems. This last visit she's been in GYN for a vulvar lesion that would not improve. She had biopsy done last Friday. She continues to have itching and burning in rectal region, she used the topical steroid this has not helped, she was told by GYN to D/C Valtrex as no sign of HSV was seen  Diabetes mellitus she had been giving herself Lantus 15 units twice a day she felt like this controlled her blood sugar better. I discontinue the Victoza and continued her on her oral medications Glipizide/ onglyza, her last A1C was 13.6% , LDL at goal Dec 2016 CBG fluctuate 120-200's, a few hypoglycemic episodes 40;s   Review Of Systems:  GEN- denies fatigue, fever, weight loss,weakness, recent illness HEENT- denies eye drainage, change in vision, nasal discharge, CVS- denies chest pain, palpitations RESP- denies SOB, cough, wheeze ABD- denies N/V, change in stools, abd pain GU- denies dysuria, hematuria, dribbling, incontinence MSK- denies joint pain, muscle aches, injury Neuro- denies headache, dizziness, syncope, seizure activity       Objective:    BP 138/78 mmHg  Pulse 68  Temp(Src) 98.6 F (37 C) (Oral)  Resp 14  Ht 5\' 4"  (1.626 m)  Wt 153 lb (69.4 kg)  BMI 26.25 kg/m2  LMP 06/18/2013 GEN- NAD, alert and oriented x3 CVS- RRR, no murmur RESP-CTAB Skin- bilat labia sutures in tact, mild swelling, perirectal approx 7 o clock position 2 small ulcerations, mild erythema, no drainage, no bleeding, no there lesions EXT- No edema Pulses- Radial - 2+        Assessment & Plan:      Problem List Items Addressed This Visit    Herpes zoster    Small ulcerations seen can be beginning of outbreak, will give Valtrex 1000mg   TID for 7 days Can also use tucks pads      Essential hypertension, benign    Well controlled,       Relevant Orders   CBC with Differential/Platelet   Diabetes mellitus type II, uncontrolled ([place])    A1C in office 7.5% which is great improvement, I do not think she is eating enough to sustain her insulin therefore having some hypoglycemia symptoms. Discussed how the insulin works and best to dose at one time. But she can not skip meals Continue Lantus 30 units, glipizide, Onglyza      Relevant Orders   Hemoglobin A1C, fingerstick (Completed)   CKD (chronic kidney disease) stage 3, GFR 30-59 ml/min - Primary   Relevant Orders   Comprehensive metabolic panel      Note: This dictation was prepared with Dragon dictation along with smaller phrase technology. Any transcriptional errors that result from this process are unintentional.

## 2015-07-06 LAB — CBC WITH DIFFERENTIAL/PLATELET
BASOS ABS: 0 {cells}/uL (ref 0–200)
Basophils Relative: 0 %
EOS PCT: 2 %
Eosinophils Absolute: 196 cells/uL (ref 15–500)
HCT: 36.5 % (ref 35.0–45.0)
Hemoglobin: 12.2 g/dL (ref 12.0–15.0)
Lymphocytes Relative: 36 %
Lymphs Abs: 3528 cells/uL (ref 850–3900)
MCH: 32.2 pg (ref 27.0–33.0)
MCHC: 33.4 g/dL (ref 32.0–36.0)
MCV: 96.3 fL (ref 80.0–100.0)
MPV: 9.9 fL (ref 7.5–12.5)
Monocytes Absolute: 686 cells/uL (ref 200–950)
Monocytes Relative: 7 %
Neutro Abs: 5390 cells/uL (ref 1500–7800)
Neutrophils Relative %: 55 %
Platelets: 238 10*3/uL (ref 140–400)
RBC: 3.79 MIL/uL — ABNORMAL LOW (ref 3.80–5.10)
RDW: 14.9 % (ref 11.0–15.0)
WBC: 9.8 10*3/uL (ref 3.8–10.8)

## 2015-07-06 LAB — COMPREHENSIVE METABOLIC PANEL
ALBUMIN: 3.8 g/dL (ref 3.6–5.1)
ALK PHOS: 68 U/L (ref 33–130)
ALT: 24 U/L (ref 6–29)
AST: 30 U/L (ref 10–35)
BILIRUBIN TOTAL: 0.4 mg/dL (ref 0.2–1.2)
BUN: 17 mg/dL (ref 7–25)
CO2: 22 mmol/L (ref 20–31)
Calcium: 8.6 mg/dL (ref 8.6–10.4)
Chloride: 106 mmol/L (ref 98–110)
Creat: 1.6 mg/dL — ABNORMAL HIGH (ref 0.50–1.05)
Glucose, Bld: 46 mg/dL — ABNORMAL LOW (ref 70–99)
Potassium: 4.4 mmol/L (ref 3.5–5.3)
SODIUM: 136 mmol/L (ref 135–146)
Total Protein: 6.1 g/dL (ref 6.1–8.1)

## 2015-07-07 ENCOUNTER — Telehealth: Payer: Self-pay | Admitting: *Deleted

## 2015-07-07 ENCOUNTER — Ambulatory Visit (INDEPENDENT_AMBULATORY_CARE_PROVIDER_SITE_OTHER): Payer: BLUE CROSS/BLUE SHIELD | Admitting: Obstetrics and Gynecology

## 2015-07-07 ENCOUNTER — Encounter: Payer: Self-pay | Admitting: Obstetrics and Gynecology

## 2015-07-07 VITALS — BP 140/88 | Ht 64.0 in | Wt 152.0 lb

## 2015-07-07 DIAGNOSIS — Z4802 Encounter for removal of sutures: Secondary | ICD-10-CM

## 2015-07-07 MED ORDER — CYCLOBENZAPRINE HCL 10 MG PO TABS: 10.0000 mg | ORAL_TABLET | Freq: Three times a day (TID) | ORAL | Status: DC | PRN

## 2015-07-07 NOTE — Progress Notes (Addendum)
[facility] Visit  Patient name: Kathryn Oconnell MRN [id number]  Date of birth: 01-13-1966  CC & HPI:  Kathryn Oconnell is a 50 y.o. female presenting today for a suture removal s/p a vulvar biopsy done by me 5 days ago. Pathology results had returned 5 days ago and showed the specimen was benign. Patient notes no new complaints since then.   ROS:  ROS Pertinent History Reviewed:   Reviewed: Significant for Cesarean section, vulvar biopsy Medical         Past Medical History  Diagnosis Date  . Hypertension   . Diabetes mellitus without complication ([place])   . Hyperlipidemia   . Hypothyroid   . Tobacco use   . Lung nodule   . COPD (chronic obstructive pulmonary disease) ([place])   . HSV-2 infection   . Depression   . Anxiety   . Graves disease   . Neuropathy ([place])   . Migraines   . Vaginal discharge 06/03/2013  . BV (bacterial vaginosis) 06/03/2013  . Unspecified symptom associated with female genital organs 06/03/2013  . Yeast infection 06/17/2013  . LUQ pain 02/18/2014  . LLQ pain 02/18/2014  . Weight loss 02/18/2014  . Menopausal vaginal dryness 02/24/2014  . Chronic kidney disease   . Eczema   . Labial lesion 06/25/2015  . Trouble in sleeping 06/25/2015                              Surgical Hx:    Past Surgical History  Procedure Laterality Date  . Cesarean section    . Thyroid surgery    . Foot surgery      5th digit left foot / Great toe, 2ND TOE   Medications: Reviewed & Updated - see associated section                       Current outpatient prescriptions:  .  ACCU-CHEK SOFTCLIX LANCETS lancets, Test once daily, Disp: 100 each, Rfl: 2 .  adapalene (DIFFERIN) 0.1 % gel, 2 (two) times daily. , Disp: , Rfl: 0 .  albuterol (PROVENTIL HFA;VENTOLIN HFA) 108 (90 BASE) MCG/ACT inhaler, Inhale 2 puffs into the lungs every 6 (six) hours as needed for wheezing or shortness of breath., Disp: 1 Inhaler, Rfl: 0 .  aspirin 81 MG tablet, Take 81 mg by mouth daily., Disp: ,  Rfl:  .  atorvastatin (LIPITOR) 20 MG tablet, Take 1 tablet (20 mg total) by mouth daily., Disp: 90 tablet, Rfl: 0 .  Blood Glucose Monitoring Suppl (ACCU-CHEK AVIVA) device, Use as instructed, Disp: 1 each, Rfl: 0 .  chlorhexidine (PERIDEX) 0.12 % solution, Use as directed 15 mLs in the mouth or throat 2 (two) times daily. (Patient taking differently: Use as directed 15 mLs in the mouth or throat as needed. ), Disp: 120 mL, Rfl: 2 .  Clobetasol Prop Emollient Base 0.05 % emollient cream, Use bid to affected area (Patient taking differently: Use 2-3 times weekly), Disp: 30 g, Rfl: 1 .  fluticasone (FLONASE) 50 MCG/ACT nasal spray, Place 2 sprays into the nose daily. For sinuses/allergies, Disp: 16 g, Rfl: 3 .  glipiZIDE (GLUCOTROL) 10 MG tablet, TAKE ONE TABLET BY MOUTH TWICE DAILY BEFORE MEALS, Disp: 180 tablet, Rfl: 6 .  glucose blood (ACCU-CHEK SMARTVIEW) test strip, Use to monitor FSBS 2x daily d/t fluctuating blood sugar. Dx: E11.9, Disp: 100 each, Rfl: 11 .  HYDROcodone-acetaminophen (  NORCO) 10-325 MG tablet, Take 1 tablet by mouth every 6 (six) hours as needed., Disp: 120 tablet, Rfl: 0 .  hydrOXYzine (ATARAX/VISTARIL) 25 MG tablet, Take 1 tablet (25 mg total) by mouth 3 (three) times daily as needed., Disp: 90 tablet, Rfl: 0 .  ibuprofen (ADVIL,MOTRIN) 800 MG tablet, TAKE ONE TABLET BY MOUTH EVERY 8 HOURS AS NEEDED, Disp: 45 tablet, Rfl: 3 .  LANTUS SOLOSTAR 100 UNIT/ML Solostar Pen, INJECT 10  TO 50 UNITS DAILY OR AS DIRECTED, Disp: 15 pen, Rfl: 3 .  levothyroxine (SYNTHROID, LEVOTHROID) 88 MCG tablet, Take 1 tablet (88 mcg total) by mouth daily., Disp: 90 tablet, Rfl: 3 .  LORazepam (ATIVAN) 0.5 MG tablet, TAKE ONE TABLET BY MOUTH EVERY 8 HOURS AS NEEDED FOR ANXIETY FOR 30 DAYS, Disp: 90 tablet, Rfl: 2 .  medroxyPROGESTERone (PROVERA) 2.5 MG tablet, TAKE ONE TABLET BY MOUTH ONCE DAILY, Disp: 30 tablet, Rfl: 3 .  metoprolol tartrate (LOPRESSOR) 25 MG tablet, TAKE ONE TABLET BY MOUTH TWICE  DAILY (Patient taking differently: TAKE 1/2 TABLET BY MOUTH TWICE DAILY), Disp: 180 tablet, Rfl: 0 .  ONE TOUCH ULTRA TEST test strip, USE TO CHECK BLOOD SUGAR TWICE DAILY, Disp: 100 each, Rfl: 11 .  ONGLYZA 5 MG TABS tablet, TAKE ONE TABLET BY MOUTH ONCE DAILY, Disp: 90 tablet, Rfl: 0 .  RELION MINI PEN NEEDLES 31G X 6 MM MISC, USE AS DIRECTED, Disp: 50 each, Rfl: 5 .  triamcinolone cream (KENALOG) 0.1 %, 1 application 2 (two) times daily. , Disp: , Rfl: 0 .  valACYclovir (VALTREX) 1000 MG tablet, Take 1 tablet (1,000 mg total) by mouth daily. (Patient taking differently: Take 1,000 mg by mouth as needed. ), Disp: 30 tablet, Rfl: 12 .  zolpidem (AMBIEN CR) 12.5 MG CR tablet, Take 1 tablet (12.5 mg total) by mouth at bedtime as needed for sleep., Disp: 30 tablet, Rfl: 0   Social History: Reviewed -  reports that she has been smoking Cigarettes.  She has a 16.5 pack-year smoking history. She has never used smokeless tobacco.  Objective Findings:  Vitals: Blood pressure 140/88, height 5\' 4"  (1.626 m), weight 152 lb (68.947 kg), last menstrual period 06/18/2013.  Physical Examination: General appearance - alert, well appearing, and in no distress, oriented to person, place, and time and normal appearing weight Mental status - alert, oriented to person, place, and time, normal mood, behavior, speech, dress, motor activity, and thought processes, affect appropriate to mood  Pelvic - VULVA: 2 sutures in place on bilateral labia majora at 4 o' clock and 8 o'clock s/p vulvar biospy; well healed area.   SUTURE REMOVAL Performed by: Jonnie Kind, MD Authorized by: Jonnie Kind, MD Consent: Verbal consent obtained. Consent given by: Patient Required items: required blood products, implants, devices, and special equipment available  Time out: Immediately prior to procedure a "time out" was called to verify the correct patient, procedure, equipment, support staff and site/side marked as  required. Location: Bilateral labia majora Wound Appearance: clean, well healing  Suture Removed: 2 Post-removal: n/a Patient tolerance: Patient tolerated the procedure well with no immediate complications.     Assessment & Plan:   A:  1. Suture removal 5 days s/p vulvar biopsy. Pt advised to keep area dry.  P: . 1. Return prn. , topical dry vulva regimen as needed to reduce recurrence frequency.     By signing my name below, I, Stephania Fragmin, attest that this documentation has been prepared under the direction  and in the presence of Jonnie Kind, MD. Electronically Signed: Stephania Fragmin, ED Scribe. 07/07/2015. 9:19 AM.  I personally performed the services described in this documentation, which was SCRIBED in my presence. The recorded information has been reviewed and considered accurate. It has been edited as necessary during review. Jonnie Kind, MD   \

## 2015-07-07 NOTE — Telephone Encounter (Signed)
Okay to refill? 

## 2015-07-07 NOTE — Telephone Encounter (Signed)
Prescription sent to pharmacy.

## 2015-07-07 NOTE — Telephone Encounter (Signed)
Received call from patient.   Reports that she requested refill on Flexeril for TMJ from Dr. Benjamine Mola. States that she was advised that PCP will need to prescribe if MD thinks appropriate.   MD please advise.

## 2015-07-14 ENCOUNTER — Other Ambulatory Visit: Payer: Self-pay | Admitting: Family Medicine

## 2015-07-14 DIAGNOSIS — E119 Type 2 diabetes mellitus without complications: Secondary | ICD-10-CM

## 2015-07-14 MED ORDER — ACCU-CHEK SOFTCLIX LANCETS MISC: Status: DC

## 2015-07-14 MED ORDER — SAXAGLIPTIN HCL 5 MG PO TABS: 5.0000 mg | ORAL_TABLET | Freq: Every day | ORAL | Status: DC

## 2015-07-14 MED ORDER — LEVOTHYROXINE SODIUM 88 MCG PO TABS: 88.0000 ug | ORAL_TABLET | Freq: Every day | ORAL | Status: DC

## 2015-07-14 MED ORDER — GLUCOSE BLOOD VI STRP: ORAL_STRIP | Status: DC

## 2015-07-14 NOTE — Telephone Encounter (Signed)
Refill appropriate and filled per protocol. 

## 2015-07-15 ENCOUNTER — Telehealth: Payer: Self-pay | Admitting: *Deleted

## 2015-07-15 MED ORDER — ONETOUCH LANCETS MISC: Status: DC

## 2015-07-15 MED ORDER — ONETOUCH ULTRA SYSTEM W/DEVICE KIT: PACK | Status: DC

## 2015-07-15 NOTE — Telephone Encounter (Signed)
Received fax requesting refill on  DM supplies. Advised insurance prefers One Touch Ultra.   Prescription sent to pharmacy.

## 2015-07-24 LAB — HEMOGLOBIN A1C: Hemoglobin A1C: 7.5

## 2015-07-26 ENCOUNTER — Other Ambulatory Visit: Payer: Self-pay | Admitting: Family Medicine

## 2015-07-26 ENCOUNTER — Telehealth: Payer: Self-pay | Admitting: Adult Health

## 2015-07-26 DIAGNOSIS — Z1231 Encounter for screening mammogram for malignant neoplasm of breast: Secondary | ICD-10-CM

## 2015-07-26 NOTE — Telephone Encounter (Signed)
Spoke with pt. Pt is requesting Diflucan for yeast. Please advise. Thanks!! [place]

## 2015-07-27 ENCOUNTER — Telehealth: Payer: Self-pay | Admitting: Family Medicine

## 2015-07-27 MED ORDER — FLUCONAZOLE 150 MG PO TABS: 150.0000 mg | ORAL_TABLET | Freq: Once | ORAL | Status: DC

## 2015-07-27 MED ORDER — BAYER CONTOUR NEXT EZ W/DEVICE KIT: PACK | Status: DC

## 2015-07-27 MED ORDER — GLUCOSE BLOOD VI STRP: ORAL_STRIP | Status: DC

## 2015-07-27 NOTE — Telephone Encounter (Signed)
Prescription sent to pharmacy.

## 2015-07-27 NOTE — Telephone Encounter (Signed)
Diflucan rx'd.

## 2015-07-27 NOTE — Telephone Encounter (Signed)
Pt needs a Contour next EZ glucose monitor and test strips to test 2x a day called in to the [facility] in [place]. Pt's number [phone]

## 2015-08-04 ENCOUNTER — Ambulatory Visit (HOSPITAL_COMMUNITY)
Admission: RE | Admit: 2015-08-04 | Discharge: 2015-08-04 | Disposition: A | Discharge status: Home / Self Care | Payer: BLUE CROSS/BLUE SHIELD | Source: Ambulatory Visit | Attending: Family Medicine | Admitting: Family Medicine

## 2015-08-04 ENCOUNTER — Encounter: Payer: Self-pay | Admitting: Family Medicine

## 2015-08-04 ENCOUNTER — Ambulatory Visit (INDEPENDENT_AMBULATORY_CARE_PROVIDER_SITE_OTHER): Payer: BLUE CROSS/BLUE SHIELD | Admitting: Family Medicine

## 2015-08-04 VITALS — BP 128/74 | HR 72 | Temp 98.9°F | Resp 14 | Ht 64.0 in | Wt 154.0 lb

## 2015-08-04 DIAGNOSIS — Z1231 Encounter for screening mammogram for malignant neoplasm of breast: Secondary | ICD-10-CM

## 2015-08-04 DIAGNOSIS — IMO0002 Reserved for concepts with insufficient information to code with codable children: Secondary | ICD-10-CM

## 2015-08-04 DIAGNOSIS — E1122 Type 2 diabetes mellitus with diabetic chronic kidney disease: Secondary | ICD-10-CM | POA: Diagnosis not present

## 2015-08-04 DIAGNOSIS — M6530 Trigger finger, unspecified finger: Secondary | ICD-10-CM | POA: Diagnosis not present

## 2015-08-04 DIAGNOSIS — M7989 Other specified soft tissue disorders: Secondary | ICD-10-CM | POA: Diagnosis not present

## 2015-08-04 DIAGNOSIS — N183 Chronic kidney disease, stage 3 (moderate): Secondary | ICD-10-CM

## 2015-08-04 DIAGNOSIS — E1165 Type 2 diabetes mellitus with hyperglycemia: Secondary | ICD-10-CM

## 2015-08-04 DIAGNOSIS — M25841 Other specified joint disorders, right hand: Secondary | ICD-10-CM | POA: Insufficient documentation

## 2015-08-04 MED ORDER — HYDROCODONE-ACETAMINOPHEN 10-325 MG PO TABS: 1.0000 | ORAL_TABLET | Freq: Four times a day (QID) | ORAL | Status: DC | PRN

## 2015-08-04 MED ORDER — CYCLOBENZAPRINE HCL 10 MG PO TABS: 10.0000 mg | ORAL_TABLET | Freq: Three times a day (TID) | ORAL | Status: DC | PRN

## 2015-08-04 NOTE — Assessment & Plan Note (Signed)
Increase bedtime Lantus to 35 units continue oral medication

## 2015-08-04 NOTE — Patient Instructions (Addendum)
Try Biofreeze or aspercreme  Get xray of hand  Referral hand surgeon  Increase lantus 35 units at bedtime  F/U as previous

## 2015-08-04 NOTE — Progress Notes (Signed)
Patient ID: Kathryn Oconnell, female   DOB: January 23, 1966, 50 y.o.   MRN: [id number]    Subjective:    Patient ID: Kathryn Oconnell, female    DOB: 1966/03/22, 50 y.o.   MRN: [id number]  Patient presents for R Thumb Pain Issue here with right thumb pain for the past 4 weeks. It pops when she tries to bend at the knuckle. She's not had a private straight but the popping is now constant. She's also noticed some swelling of her knuckle and in the morning time her entire right hand is swollen.  Her thumb is now becoming painful with Merian Capron is not taking any anti-inflammatories secondary to her chronic kidney disease.  He requests refill of her pain medication.  IV smell as her last A1c was 7.5% she's currently on Lantus 30 units at bedtime says her fasting sugars have been going up into the 180s 190s    Review Of Systems:  GEN- denies fatigue, fever, weight loss,weakness, recent illness HEENT- denies eye drainage, change in vision, nasal discharge, CVS- denies chest pain, palpitations RESP- denies SOB, cough, wheeze ABD- denies N/V, change in stools, abd pain GU- denies dysuria, hematuria, dribbling, incontinence MSK- + joint pain, muscle aches, injury Neuro- denies headache, dizziness, syncope, seizure activity       Objective:    BP 128/74 mmHg  Pulse 72  Temp(Src) 98.9 F (37.2 C) (Oral)  Resp 14  Ht 5\' 4"  (1.626 m)  Wt 154 lb (69.854 kg)  BMI 26.42 kg/m2  LMP 06/18/2013 GEN- NAD, alert and oriented x3 MSK- Right hand- swelling at DIP of thumb, trigger and popping heard, felt, mild TTP  No other swelling or nodules of right hand or left hand, FROM other finger joints  Pulse- Radial 2+        Assessment & Plan:      Problem List Items Addressed This Visit    None    Visit Diagnoses    Trigger finger, acquired    -  Primary    Appears to have trigger, may have some OA as well, obtain xray, topical anti-inflammatory, refer to hand surgeon    Relevant Orders    DG  Hand Complete Right    Swelling of right hand        Relevant Orders    DG Hand Complete Right       Note: This dictation was prepared with Dragon dictation along with smaller phrase technology. Any transcriptional errors that result from this process are unintentional.

## 2015-08-19 ENCOUNTER — Telehealth: Payer: Self-pay | Admitting: Family Medicine

## 2015-08-19 DIAGNOSIS — E1165 Type 2 diabetes mellitus with hyperglycemia: Secondary | ICD-10-CM

## 2015-08-19 DIAGNOSIS — E118 Type 2 diabetes mellitus with unspecified complications: Principal | ICD-10-CM

## 2015-08-19 DIAGNOSIS — Z794 Long term (current) use of insulin: Principal | ICD-10-CM

## 2015-08-19 DIAGNOSIS — IMO0002 Reserved for concepts with insufficient information to code with codable children: Secondary | ICD-10-CM

## 2015-08-19 NOTE — Telephone Encounter (Signed)
Sugars are running high again 270 -290 during the day.  Fastings in AM 150 - 200.  Is using meds as directed by you.  Is concerned not good control.  Would like to see Endocrinologist

## 2015-08-20 NOTE — Telephone Encounter (Signed)
Increase lantus to 38 units, she was suppose to be on 35 units at bedtime , along with her ongylza and glipizide Okay to refer to endocrine Send last set of labs, A1C was 7.5% in April

## 2015-08-20 NOTE — Telephone Encounter (Signed)
LMTCB  Referral initiated

## 2015-08-21 ENCOUNTER — Other Ambulatory Visit: Payer: Self-pay | Admitting: Family Medicine

## 2015-08-22 ENCOUNTER — Other Ambulatory Visit: Payer: Self-pay | Admitting: Family Medicine

## 2015-08-23 NOTE — Telephone Encounter (Signed)
Refill appropriate and filled per protocol. 

## 2015-08-24 NOTE — Telephone Encounter (Signed)
Pt is aware of insulin changes and that referral has been placed

## 2015-08-26 ENCOUNTER — Telehealth: Payer: Self-pay | Admitting: *Deleted

## 2015-08-26 NOTE — Telephone Encounter (Signed)
Received call from patient.   Reports x4 days of tingling like pressure to head. States that different places inside her scalp feels like there is pressure on it intermittently. Reports that it most closely resembles the feeling of a worm crawling in her brain or the tingling from brain freeze.   Reports that she was seen on 08/25/2015 at [facility] for sinus pressure and was advised that she has ear infection as well.   Patient has appointment scheduled with PCP on 08/27/2015.Advised to go to ER for eval.

## 2015-08-27 ENCOUNTER — Ambulatory Visit (INDEPENDENT_AMBULATORY_CARE_PROVIDER_SITE_OTHER): Payer: BLUE CROSS/BLUE SHIELD | Admitting: Family Medicine

## 2015-08-27 ENCOUNTER — Encounter: Payer: Self-pay | Admitting: Family Medicine

## 2015-08-27 ENCOUNTER — Encounter: Payer: Self-pay | Admitting: *Deleted

## 2015-08-27 VITALS — BP 128/78 | HR 82 | Temp 98.7°F | Resp 14 | Ht 64.0 in | Wt 154.0 lb

## 2015-08-27 DIAGNOSIS — R209 Unspecified disturbances of skin sensation: Secondary | ICD-10-CM | POA: Diagnosis not present

## 2015-08-27 DIAGNOSIS — I10 Essential (primary) hypertension: Secondary | ICD-10-CM

## 2015-08-27 DIAGNOSIS — M26629 Arthralgia of temporomandibular joint, unspecified side: Secondary | ICD-10-CM | POA: Diagnosis not present

## 2015-08-27 DIAGNOSIS — J320 Chronic maxillary sinusitis: Secondary | ICD-10-CM | POA: Diagnosis not present

## 2015-08-27 MED ORDER — HYDROCODONE-ACETAMINOPHEN 10-325 MG PO TABS: 1.0000 | ORAL_TABLET | Freq: Four times a day (QID) | ORAL | Status: DC | PRN

## 2015-08-27 MED ORDER — GLIPIZIDE 10 MG PO TABS: ORAL_TABLET | ORAL | Status: DC

## 2015-08-27 MED ORDER — SAXAGLIPTIN HCL 5 MG PO TABS: 5.0000 mg | ORAL_TABLET | Freq: Every day | ORAL | Status: DC

## 2015-08-27 MED ORDER — ATORVASTATIN CALCIUM 20 MG PO TABS: 20.0000 mg | ORAL_TABLET | Freq: Every day | ORAL | Status: DC

## 2015-08-27 MED ORDER — LORAZEPAM 0.5 MG PO TABS: ORAL_TABLET | ORAL | Status: DC

## 2015-08-27 MED ORDER — METOPROLOL TARTRATE 25 MG PO TABS: 12.5000 mg | ORAL_TABLET | Freq: Two times a day (BID) | ORAL | Status: DC

## 2015-08-27 MED ORDER — CHLORHEXIDINE GLUCONATE 0.12 % MT SOLN: 15.0000 mL | Freq: Two times a day (BID) | OROMUCOSAL | Status: DC

## 2015-08-27 MED ORDER — LEVOTHYROXINE SODIUM 88 MCG PO TABS: 88.0000 ug | ORAL_TABLET | Freq: Every day | ORAL | Status: DC

## 2015-08-27 NOTE — Progress Notes (Signed)
Patient ID: Kathryn Oconnell, female   DOB: [birth year], 50 y.o.   MRN: [id number]   Subjective:    Patient ID: Kathryn Oconnell, female    DOB: [birth year], 50 y.o.   MRN: [id number]  Patient presents for ER F/U  Pt here for ER follow up, seen in ED  Yesterday after having a sensation in scalp, that persisted for many hours, she became worried as she has some tingling on face for CVA. Sent to ER, CT head neg, labs unremarkable, renal function was at baseline, BP was elevated Glucose normal.  No other new meds   She would also like 2nd opinion about recurrent sinusitis issues and TMJ pain. Currently on antibiotics by urgent care for another infection.     Review Of Systems:  GEN- denies fatigue, fever, weight loss,weakness, recent illness HEENT- denies eye drainage, change in vision,+ nasal discharge, CVS- denies chest pain, palpitations RESP- denies SOB, cough, wheeze ABD- denies N/V, change in stools, abd pain GU- denies dysuria, hematuria, dribbling, incontinence MSK-+ joint pain, muscle aches, injury Neuro- denies headache, dizziness, syncope, seizure activity       Objective:    BP 128/78 mmHg  Pulse 82  Temp(Src) 98.7 F (37.1 C) (Oral)  Resp 14  Ht 5\' 4"  (1.626 m)  Wt 154 lb (69.854 kg)  BMI 26.42 kg/m2  LMP 06/18/2013 GEN- NAD, alert and oriented x3 HEENT- PERRL, EOMI, non injected sclera, pink conjunctiva, MMM, oropharynx clear, mild erythema right TM no effusion, canals clear bilat, nares clear rhinorrhea  Neck- Supple, no LAD  CVS- RRR, no murmur RESP-CTAB Neuro- CNII-XII intact, no deficits  EXT- No edema         Assessment & Plan:      Problem List Items Addressed This Visit    Essential hypertension, benign    BP at goal today in office no change to meds      Relevant Medications   atorvastatin (LIPITOR) 20 MG tablet   metoprolol tartrate (LOPRESSOR) 25 MG tablet    Other Visit Diagnoses    Disturbance of skin sensation    -  Primary    Normal  exam and work up given reassurance       Note: This dictation was prepared with Diplomatic Services operational officer dictation along with smaller Company secretary. Any transcriptional errors that result from this process are unintentional.

## 2015-08-27 NOTE — Assessment & Plan Note (Signed)
BP at goal today in office no change to meds

## 2015-08-27 NOTE — Patient Instructions (Signed)
F/U as previous  You can stop the gabapentin

## 2015-09-14 ENCOUNTER — Ambulatory Visit (INDEPENDENT_AMBULATORY_CARE_PROVIDER_SITE_OTHER): Payer: BLUE CROSS/BLUE SHIELD | Admitting: Adult Health

## 2015-09-14 ENCOUNTER — Encounter: Payer: Self-pay | Admitting: Adult Health

## 2015-09-14 VITALS — BP 136/70 | HR 90 | Ht 64.0 in | Wt 156.0 lb

## 2015-09-14 DIAGNOSIS — B379 Candidiasis, unspecified: Secondary | ICD-10-CM | POA: Diagnosis not present

## 2015-09-14 DIAGNOSIS — N898 Other specified noninflammatory disorders of vagina: Secondary | ICD-10-CM

## 2015-09-14 LAB — POCT WET PREP (WET MOUNT)

## 2015-09-14 MED ORDER — TERCONAZOLE 0.4 % VA CREA: 1.0000 | TOPICAL_CREAM | Freq: Every day | VAGINAL | Status: DC

## 2015-09-14 MED ORDER — MEDROXYPROGESTERONE ACETATE 2.5 MG PO TABS: 2.5000 mg | ORAL_TABLET | Freq: Every day | ORAL | Status: DC

## 2015-09-14 NOTE — Patient Instructions (Signed)
Use terazol Follow up prn

## 2015-09-14 NOTE — Progress Notes (Signed)
Subjective:     Patient ID: Kathryn Oconnell, female   DOB: [birth year], 50 y.o.   MRN: [id number]  HPI Kathryn Oconnell is a 50 year old black female in complaining of ?eyast infection was on antibiotics last week an vagina just does not feel right and she wants labia checked where had biopsy.And she requests refill on provera.   Review of Systems Patient denies any headaches, hearing loss, fatigue, blurred vision, shortness of breath, chest pain, abdominal pain, problems with bowel movements, urination, or intercourse(not having sex). No joint pain or mood swings.See HPI for positives. Reviewed past medical,surgical, social and family history. Reviewed medications and allergies.     Objective:   Physical Exam BP 136/70 mmHg  Pulse 90  Ht 5\' 4"  (1.626 m)  Wt 156 lb (70.761 kg)  BMI 26.76 kg/m2  LMP 06/18/2013 Skin warm and dry.Pelvic: external genitalia is normal in appearance,well healed, no lesions, vagina: white discharge without odor,urethra has no lesions or masses noted, cervix:smooth, uterus: normal size, shape and contour, non tender, no masses felt, adnexa: no masses or tenderness noted. Bladder is non tender and no masses felt. Wet prep: + yeast and +WBCs. Abdomen soft and non tender.    Assessment:    Vaginal discharge Yeast     Plan:      Rx terazol 7 cream use 1 applicator in vagina at hs for 7 days and 1 refill Refilled provera 2.5 mg #30 take 1 daily with 11 refills Follow up prn

## 2015-09-23 ENCOUNTER — Encounter: Payer: Self-pay | Admitting: Nutrition

## 2015-09-23 ENCOUNTER — Ambulatory Visit (INDEPENDENT_AMBULATORY_CARE_PROVIDER_SITE_OTHER): Payer: BLUE CROSS/BLUE SHIELD | Admitting: "Endocrinology

## 2015-09-23 ENCOUNTER — Encounter: Payer: Self-pay | Admitting: "Endocrinology

## 2015-09-23 ENCOUNTER — Encounter: Payer: BLUE CROSS/BLUE SHIELD | Attending: Family Medicine | Admitting: Nutrition

## 2015-09-23 VITALS — BP 127/83 | HR 92 | Ht 64.0 in | Wt 155.0 lb

## 2015-09-23 VITALS — Ht 64.0 in | Wt 155.0 lb

## 2015-09-23 DIAGNOSIS — N183 Chronic kidney disease, stage 3 (moderate): Secondary | ICD-10-CM

## 2015-09-23 DIAGNOSIS — E118 Type 2 diabetes mellitus with unspecified complications: Secondary | ICD-10-CM | POA: Diagnosis not present

## 2015-09-23 DIAGNOSIS — IMO0002 Reserved for concepts with insufficient information to code with codable children: Secondary | ICD-10-CM

## 2015-09-23 DIAGNOSIS — E1122 Type 2 diabetes mellitus with diabetic chronic kidney disease: Secondary | ICD-10-CM

## 2015-09-23 DIAGNOSIS — I10 Essential (primary) hypertension: Secondary | ICD-10-CM | POA: Diagnosis not present

## 2015-09-23 DIAGNOSIS — E1165 Type 2 diabetes mellitus with hyperglycemia: Secondary | ICD-10-CM

## 2015-09-23 DIAGNOSIS — E038 Other specified hypothyroidism: Secondary | ICD-10-CM | POA: Diagnosis not present

## 2015-09-23 DIAGNOSIS — Z794 Long term (current) use of insulin: Secondary | ICD-10-CM | POA: Insufficient documentation

## 2015-09-23 NOTE — Patient Instructions (Signed)

## 2015-09-23 NOTE — Progress Notes (Signed)
  Medical Nutrition Therapy:  Appt start time: 1100 end time:  1130.  Assessment:  Primary concerns today: Diabetes Type 2. She is a walk in from Dr. Dorris Fetch. Hasnt met with an RD before. She is an LPN. A1C was 13% and now down to 7.5%. She is upset with the 10+ lbs weight gain she has since Jan-Mar 2017 since going on insulin. She reports she has eaten the same as she always has and now that she is on insulin she is gaining weight and doesn't want to be on insulin anymore. Currently on 30 units of Lantus as well as Onglyza. Glipizide was stopped today by Dr. Dorris Fetch.. She  Her meals are not on schedule. Tends to eat 2 meals per day and not on routine schedule.  Not exercising.  She has CKD Stg 3 and can't be on Metformin anymore. Safety questions will be answered at next visit due to short walk in visit. Current diet exceeds her needs for controlling her BS and maintaining her weight.    Lab Results  Component Value Date   HGBA1C 7.5 07/24/2015   Preferred Learning Style:    No preference indicated   Learning Readiness:     Contemplating     MEDICATIONS: See list DIETARY INTAKE:  Eats 2 meals per day. Late breakfast and large dinner. Doesn't eat on a routine schedule. Typically eats a lot of fast foods and not home prepared meals. Says she is too busy to cook meals and prepare meals to take to work.  Usual physical activity: ADL  Estimated energy needs: 1500 calories 170 g carbohydrates 112 g protein 42 g fat  Progress Towards Goal(s):  In progress.   Nutritional Diagnosis:  NB-1.1 Food and nutrition-related knowledge deficit As related to Diabetes.  As evidenced by A1 13% and now down to 7.5%..    Intervention:  Nutrition and Diabetes education provided on My Plate, CHO counting, meal planning, portion sizes, timing of meals, avoiding snacks between meals unless having a low blood sugar, target ranges for A1C and blood sugars, signs/symptoms and treatment of hyper/hypoglycemia,  monitoring blood sugars, taking medications as prescribed, benefits of exercising 30 minutes per day and prevention of complications of DM. Marland Kitchen Goals: 1. Follow My Plate Method 2. Eat three meals per day at about the times discussed. 3. Try a protein shake, cereal bar or fruit and protein at breakfast daily. 4. Avoid skipping meals. 5. Eat 2-3 carb choices per meal. 6. Take meds as prescribed. 7. Metformin is NOT recommended due to decreased kidney function. 8. Bring meter and blood sugar logs with you at each visit. 9. Exercise 30 minutes per day. 10. Lose 2-3 lbs per month.  Teaching Method Utilized:  Visual Auditory Hands on  Handouts given during visit include:  The Plate Method  Meal Plan Card    Barriers to learning/adherence to lifestyle change: None  Demonstrated degree of understanding via:  Teach Back   Monitoring/Evaluation:  Dietary intake, exercise, meal planning, SBG, and body weight in 1 month(s)  .

## 2015-09-23 NOTE — Progress Notes (Signed)
Subjective:    Patient ID: Kathryn Oconnell, female    DOB: 1965/05/20. Patient is being seen in consultation for management of diabetes requested by  Vic Blackbird, MD  Past Medical History  Diagnosis Date  . Hypertension   . Diabetes mellitus without complication ([place])   . Hyperlipidemia   . Hypothyroid   . Tobacco use   . Lung nodule   . COPD (chronic obstructive pulmonary disease) ([place])   . HSV-2 infection   . Depression   . Anxiety   . Graves disease   . Neuropathy ([place])   . Migraines   . Vaginal discharge 06/03/2013  . BV (bacterial vaginosis) 06/03/2013  . Unspecified symptom associated with female genital organs 06/03/2013  . Yeast infection 06/17/2013  . LUQ pain 02/18/2014  . LLQ pain 02/18/2014  . Weight loss 02/18/2014  . Menopausal vaginal dryness 02/24/2014  . Chronic kidney disease   . Eczema   . Labial lesion 06/25/2015  . Trouble in sleeping 06/25/2015   Past Surgical History  Procedure Laterality Date  . Cesarean section    . Thyroid surgery    . Foot surgery      5th digit left foot / Great toe, 2ND TOE   Social History   Social History  . Marital Status: Married    Spouse Name: N/A  . Number of Children: N/A  . Years of Education: N/A   Social History Main Topics  . Smoking status: Current Every Day Smoker -- 0.50 packs/day for 35 years    Types: Cigarettes  . Smokeless tobacco: Never Used  . Alcohol Use: No  . Drug Use: No  . Sexual Activity: Not Currently    Birth Control/ Protection: Post-menopausal   Other Topics Concern  . None   Social History Narrative   Outpatient Encounter Prescriptions as of 09/23/2015  Medication Sig  . Insulin Glargine (LANTUS SOLOSTAR [place]) Inject 30 Units into the skin at bedtime.  Marland Kitchen adapalene (DIFFERIN) 0.1 % gel 2 (two) times daily.   Marland Kitchen albuterol (PROVENTIL HFA;VENTOLIN HFA) 108 (90 BASE) MCG/ACT inhaler Inhale 2 puffs into the lungs every 6 (six) hours as needed for wheezing or shortness of breath.  Marland Kitchen  aspirin 81 MG tablet Take 81 mg by mouth daily.  Marland Kitchen atorvastatin (LIPITOR) 20 MG tablet Take 1 tablet (20 mg total) by mouth daily.  . Blood Glucose Monitoring Suppl (CONTOUR NEXT EZ MONITOR) w/Device KIT Use to monitor FSBS 2x daily. Dx: E11.9.  Marland Kitchen chlorhexidine (PERIDEX) 0.12 % solution Use as directed 15 mLs in the mouth or throat 2 (two) times daily. (Patient taking differently: Use as directed 15 mLs in the mouth or throat as needed. )  . cyclobenzaprine (FLEXERIL) 10 MG tablet Take 1 tablet (10 mg total) by mouth 3 (three) times daily as needed.  . fluticasone (FLONASE) 50 MCG/ACT nasal spray Place 2 sprays into the nose daily. For sinuses/allergies  . glucose blood (BAYER CONTOUR NEXT TEST) test strip Use to monitor FSBS 2x daily. Dx: E11.9.  . HYDROcodone-acetaminophen (NORCO) 10-325 MG tablet Take 1 tablet by mouth every 6 (six) hours as needed.  . hydrOXYzine (ATARAX/VISTARIL) 25 MG tablet Take 1 tablet (25 mg total) by mouth 3 (three) times daily as needed.  Marland Kitchen levothyroxine (SYNTHROID, LEVOTHROID) 88 MCG tablet Take 1 tablet (88 mcg total) by mouth daily.  Marland Kitchen LORazepam (ATIVAN) 0.5 MG tablet 1 tab every 8 hours as needed  . medroxyPROGESTERone (PROVERA) 2.5 MG tablet Take 1 tablet (  2.5 mg total) by mouth daily.  . metoprolol tartrate (LOPRESSOR) 25 MG tablet Take 0.5 tablets (12.5 mg total) by mouth 2 (two) times daily.  . ONE TOUCH LANCETS MISC Use to monitor FSBS 1x daily. Dx: E11.9.  . RELION MINI PEN NEEDLES 31G X 6 MM MISC USE AS DIRECTED  . saxagliptin HCl (ONGLYZA) 5 MG TABS tablet Take 1 tablet (5 mg total) by mouth daily.  . valACYclovir (VALTREX) 1000 MG tablet Take 1 tablet (1,000 mg total) by mouth daily. (Patient taking differently: Take 1,000 mg by mouth as needed. )  . zolpidem (AMBIEN CR) 12.5 MG CR tablet Take 1 tablet (12.5 mg total) by mouth at bedtime as needed for sleep.  . [DISCONTINUED] glipiZIDE (GLUCOTROL) 10 MG tablet TAKE ONE TABLET BY MOUTH TWICE DAILY BEFORE  MEALS  . [DISCONTINUED] LANTUS SOLOSTAR 100 UNIT/ML Solostar Pen INJECT 10 TO 50 UNITS DAILY AS OR AS DIRECTED  . [DISCONTINUED] terconazole (TERAZOL 7) 0.4 % vaginal cream Place 1 applicator vaginally at bedtime.  . [DISCONTINUED] triamcinolone cream (KENALOG) 0.1 % 1 application 2 (two) times daily.    No facility-administered encounter medications on file as of 09/23/2015.   ALLERGIES: Allergies  Allergen Reactions  . Erythromycin   . Imitrex [Sumatriptan]   . Sulfa Antibiotics Other (See Comments)    Causes stomach cramps  . Topamax [Topiramate]     Nausea and worsening   VACCINATION STATUS: Immunization History  Administered Date(s) Administered  . Hepatitis B 11/11/1998, 12/16/1998  . Influenza Whole 12/14/2006  . Influenza,inj,Quad PF,36+ Mos 01/08/2015  . Influenza-Unspecified 12/02/2012  . MMR 07/14/1998  . Pneumococcal Polysaccharide-23 05/07/2006  . Td 04/04/2001, 02/28/2010  . Tdap 02/28/2010, 01/08/2015    Diabetes She presents for her initial diabetic visit. She has type 2 diabetes mellitus. Onset time: She was diagnosed at approximate age of 30 years, she did have history of gestational diabetes at age 50. Her disease course has been improving. There are no hypoglycemic associated symptoms. Pertinent negatives for hypoglycemia include no confusion, headaches, pallor or seizures. Associated symptoms include fatigue, polydipsia and polyuria. Pertinent negatives for diabetes include no chest pain and no polyphagia. There are no hypoglycemic complications. Symptoms are improving. Diabetic complications include nephropathy. Risk factors for coronary artery disease include dyslipidemia, diabetes mellitus, hypertension, tobacco exposure and sedentary lifestyle. Current diabetic treatment includes insulin injections and oral agent (dual therapy). Her weight is increasing steadily. She is following a generally unhealthy diet. When asked about meal planning, she reported none. She  has not had a previous visit with a dietitian. She rarely participates in exercise. Home blood sugar record trend: She did not bring any meter nor log to review. She admits that she does not monitor blood glucose regularly. (Although it is not available to review today, she says her A1c was 13% in January 2017. He has since improved to 7.5% in April 2017. these labs are not available to review. ) An ACE inhibitor/angiotensin II receptor blocker is not being taken.  Hyperlipidemia This is a chronic problem. The current episode started more than 1 year ago. Exacerbating diseases include diabetes and hypothyroidism. Pertinent negatives include no chest pain, myalgias or shortness of breath. Current antihyperlipidemic treatment includes statins. Risk factors for coronary artery disease include diabetes mellitus, dyslipidemia, hypertension and a sedentary lifestyle.  Hypertension This is a chronic problem. The current episode started more than 1 year ago. Pertinent negatives include no chest pain, headaches, palpitations or shortness of breath. Past treatments include beta blockers.  The current treatment provides moderate improvement. Hypertensive end-organ damage includes kidney disease and a thyroid problem.  Thyroid Problem Presents for initial visit. Onset time: In the 80s she underwent partial thyroidectomy preceded by unsuccessful radioactive and therapy for reported hyperthyroidism and since then she was put on levothyroxine at various doses currently at 88 g by mouth every morning. Symptoms include fatigue. Patient reports no cold intolerance, diarrhea, heat intolerance or palpitations. Past treatments include iodine 131. Prior procedures include thyroidectomy. Her past medical history is significant for diabetes and hyperlipidemia.       Review of Systems  Constitutional: Positive for fatigue and unexpected weight change. Negative for fever and chills.  HENT: Negative for trouble swallowing and  voice change.   Eyes: Negative for visual disturbance.  Respiratory: Negative for cough, shortness of breath and wheezing.   Cardiovascular: Negative for chest pain, palpitations and leg swelling.  Gastrointestinal: Negative for nausea, vomiting and diarrhea.  Endocrine: Positive for polydipsia and polyuria. Negative for cold intolerance, heat intolerance and polyphagia.  Musculoskeletal: Negative for myalgias and arthralgias.  Skin: Negative for color change, pallor, rash and wound.  Neurological: Negative for seizures and headaches.  Psychiatric/Behavioral: Negative for suicidal ideas and confusion.    Objective:    BP 127/83 mmHg  Pulse 92  Ht 5' 4"  (1.626 m)  Wt 155 lb (70.308 kg)  BMI 26.59 kg/m2  LMP 06/18/2013  Wt Readings from Last 3 Encounters:  09/23/15 155 lb (70.308 kg)  09/14/15 156 lb (70.761 kg)  08/27/15 154 lb (69.854 kg)    Physical Exam  Constitutional: She is oriented to person, place, and time. She appears well-developed.  HENT:  Head: Normocephalic and atraumatic.  Eyes: EOM are normal.  Neck: Normal range of motion. Neck supple. No tracheal deviation present. No thyromegaly present.  Cardiovascular: Normal rate and regular rhythm.   Pulmonary/Chest: Effort normal and breath sounds normal.  Abdominal: Soft. Bowel sounds are normal. There is no tenderness. There is no guarding.  Musculoskeletal: Normal range of motion. She exhibits no edema.  Neurological: She is alert and oriented to person, place, and time. She has normal reflexes. No cranial nerve deficit. Coordination normal.  Skin: Skin is warm and dry. No rash noted. No erythema. No pallor.  Psychiatric: She has a normal mood and affect. Judgment normal.    CMP     Component Value Date/Time   NA 136 07/05/2015 1512   NA 140 10/21/2012 1547   K 4.4 07/05/2015 1512   CL 106 07/05/2015 1512   CO2 22 07/05/2015 1512   GLUCOSE 46* 07/05/2015 1512   GLUCOSE 161* 10/27/2014 1427   GLUCOSE 95  10/21/2012 1547   BUN 17 07/05/2015 1512   BUN 21 10/21/2012 1547   CREATININE 1.60* 07/05/2015 1512   CREATININE 1.17* 10/21/2012 1547   CALCIUM 8.6 07/05/2015 1512   PROT 6.1 07/05/2015 1512   PROT 7.2 10/21/2012 1547   ALBUMIN 3.8 07/05/2015 1512   ALBUMIN 4.6 10/21/2012 1547   AST 30 07/05/2015 1512   ALT 24 07/05/2015 1512   ALKPHOS 68 07/05/2015 1512   BILITOT 0.4 07/05/2015 1512   GFRNONAA 54* 07/28/2014 1546   GFRNONAA 56* 10/21/2012 1547   GFRAA 62 07/28/2014 1546   GFRAA 64 10/21/2012 1547    Diabetic Labs (most recent): Lab Results  Component Value Date   HGBA1C 7.5 07/24/2015   HGBA1C 7.5* 07/05/2015   HGBA1C 13 04/25/2015     Lipid Panel ( most recent) Lipid Panel  Component Value Date/Time   CHOL 140 03/24/2015 1041   TRIG 91 03/24/2015 1041   HDL 30* 03/24/2015 1041   CHOLHDL 4.7 03/24/2015 1041   VLDL 18 03/24/2015 1041   LDLCALC 92 03/24/2015 1041      Assessment & Plan:   1. Uncontrolled type 2 diabetes mellitus with stage 3 chronic kidney disease, without long-term current use of insulin ([place])   - Patient has currently uncontrolled symptomatic type 2 DM since  50 years of age,  with most recent A1c of 7.5 %- reported verbally by the patient who states that he has improved from 13% in 04/2015. Recent labs reviewed.   - her  diabetes is complicated by  CKD and patient remains at a high risk for more acute and chronic complications of diabetes which include CAD, CVA, CKD, retinopathy, and neuropathy. These are all discussed in detail with the patient.  - I have counseled the patient on diet management and weight loss, by adopting a carbohydrate restricted/protein rich diet.  - Suggestion is made for patient to avoid simple carbohydrates   from their diet including Cakes , Desserts, Ice Cream,  Soda (  diet and regular) , Sweet Tea , Candies,  Chips, Cookies, Artificial Sweeteners,   and "Sugar-free" Products . This will help patient to have  stable blood glucose profile and potentially avoid unintended weight gain.  - I encouraged the patient to switch to  unprocessed or minimally processed complex starch and increased protein intake (animal or plant source), fruits, and vegetables.  - Patient is advised to stick to a routine mealtimes to eat 3 meals  a day and avoid unnecessary snacks ( to snack only to correct hypoglycemia).  - The patient will be scheduled with Jearld Fenton, RDN, CDE for individualized DM education.  - I have approached patient with the following individualized plan to manage diabetes and patient agrees:   - I  will proceed to initiate strict monitoring of glucose  AC and HS for one week and present her meter and logs to review. -I will decrease her basal insulin Lantus to 30 units daily at bedtime. -If she requires any more insulin, she would be custard for prandial insulin using rapid acting insulin.  -Patient is encouraged to call clinic for blood glucose levels less than 70 or above 300 mg /dl. - I will continue Onglyza 56monce a day, therapeutically suitable for patient.. - I will discontinue glipizide, risk outweighs benefit for this patient. - Patient will be considered for incretin therapy as appropriate next visit. - Patient specific target  A1c;  LDL, HDL, Triglycerides, and  Waist Circumference were discussed in detail.  2) BP/HTN: Controlled. Continue current medications, she has a documented allergy to sulfa medications. 3) Lipids/HPL:   Controlled unknown, I advised her to continue statins. 4)  Weight/Diet: CDE Consult will be initiated , exercise, and detailed carbohydrates information provided.  5) Chronic Care/Health Maintenance:  -Patient is on Statin medications and encouraged to continue to follow up with Ophthalmology, Podiatrist at least yearly or according to recommendations, and advised to  stay away from smoking. I have recommended yearly flu vaccine and pneumonia vaccination at  least every 5 years; moderate intensity exercise for up to 150 minutes weekly; and  sleep for at least 7 hours a day.  - 60 minutes of time was spent on the care of this patient , 50% of which was applied for counseling on diabetes complications and their preventions.  - Patient to bring  meter and  blood glucose logs during their next visit.  - I advised patient to maintain close follow up with Vic Blackbird, MD for primary care needs.  Follow up plan: - Return in about 1 week (around 09/30/2015) for diabetes, high blood pressure, high cholesterol, underactive thyroid, follow up with meter and logs- no labs.  Glade Lloyd, MD Phone: [phone]  Fax: [phone]   09/23/2015, 10:49 AM

## 2015-09-23 NOTE — Patient Instructions (Signed)
Goals: 1. Follow My Plate Method 2. Eat three meals per day at about the times discussed. 3. Try a protein shake, cereal bar or fruit and protein at breakfast daily. 4. Avoid skipping meals. 5. Eat 2-3 carb choices per meal. 6. Take meds as prescribed. 7. Metformin is NOT recommended due to decreased kidney function. 8. Bring meter and blood sugar logs with you at each visit. 9. Exercise 30 minutes per day. 10. Lose 2-3 lbs per month.

## 2015-10-02 ENCOUNTER — Other Ambulatory Visit: Payer: Self-pay | Admitting: Family Medicine

## 2015-10-04 ENCOUNTER — Ambulatory Visit: Payer: BLUE CROSS/BLUE SHIELD | Admitting: "Endocrinology

## 2015-10-04 ENCOUNTER — Encounter: Payer: Self-pay | Admitting: Family Medicine

## 2015-10-04 ENCOUNTER — Ambulatory Visit (INDEPENDENT_AMBULATORY_CARE_PROVIDER_SITE_OTHER): Payer: BLUE CROSS/BLUE SHIELD | Admitting: Family Medicine

## 2015-10-04 VITALS — BP 122/78 | HR 78 | Temp 98.0°F | Resp 14 | Ht 64.0 in | Wt 156.0 lb

## 2015-10-04 DIAGNOSIS — N183 Chronic kidney disease, stage 3 unspecified: Secondary | ICD-10-CM

## 2015-10-04 DIAGNOSIS — I10 Essential (primary) hypertension: Secondary | ICD-10-CM

## 2015-10-04 DIAGNOSIS — E1122 Type 2 diabetes mellitus with diabetic chronic kidney disease: Secondary | ICD-10-CM

## 2015-10-04 DIAGNOSIS — E1165 Type 2 diabetes mellitus with hyperglycemia: Secondary | ICD-10-CM | POA: Diagnosis not present

## 2015-10-04 DIAGNOSIS — IMO0002 Reserved for concepts with insufficient information to code with codable children: Secondary | ICD-10-CM

## 2015-10-04 MED ORDER — INSULIN GLARGINE 100 UNIT/ML SOLOSTAR PEN: PEN_INJECTOR | SUBCUTANEOUS | Status: DC

## 2015-10-04 MED ORDER — CYCLOBENZAPRINE HCL 10 MG PO TABS: 10.0000 mg | ORAL_TABLET | Freq: Three times a day (TID) | ORAL | Status: DC | PRN

## 2015-10-04 MED ORDER — ZOLPIDEM TARTRATE ER 12.5 MG PO TBCR: 12.5000 mg | EXTENDED_RELEASE_TABLET | Freq: Every evening | ORAL | Status: DC | PRN

## 2015-10-04 MED ORDER — HYDROCODONE-ACETAMINOPHEN 10-325 MG PO TABS: 1.0000 | ORAL_TABLET | Freq: Four times a day (QID) | ORAL | Status: DC | PRN

## 2015-10-04 NOTE — Telephone Encounter (Signed)
Okay give 2

## 2015-10-04 NOTE — Telephone Encounter (Signed)
Refill appropriate and filled per protocol. 

## 2015-10-04 NOTE — Assessment & Plan Note (Signed)
Diabetes she has fair control. She will continue follow-up with endocrinology or go ahead and check her A1c today  And her renal function. She was on GLP-1 did not get any benefit from this because of her chronic kidney disease we have not used SGL2 . She is now off of glipizide she may need prandial insulin injections he also discussed her eating habits and trying to eat regularly using Glucerna in the morning to alternate with yogurt she does not have appetite in the morning

## 2015-10-04 NOTE — Telephone Encounter (Signed)
LRF 06/01/15 #30  LOV 08/27/15  OK refill?

## 2015-10-04 NOTE — Assessment & Plan Note (Signed)
Blood pressure well controlled and changed her medication 

## 2015-10-04 NOTE — Telephone Encounter (Signed)
Medication called to pharmacy. 

## 2015-10-04 NOTE — Progress Notes (Signed)
Patient ID: Kathryn Oconnell, female   DOB: 03-14-1966, 50 y.o.   MRN: [id number]   Subjective:    Patient ID: Kathryn Oconnell, female    DOB: [birth year], 50 y.o.   MRN: [id number]  Patient presents for 3 month F/U Patient for follow-up on chronic medical problems. She was referred to endocrinology for diabetes mellitus her last A1c was 7.5% she's been following with the diabetic nutritionist Her Lantus was decreased to 30 units at bedtime with the consideration of adding prandial insulin if her blood sugars are still not controlled,  onglyza was continued,glipizide I was discontinued. She was asked to supposed to follow up this morning to review her blood sugar log with her endocrinologist she missed that appointment.  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  No other new concerns  Medications reviewed Review Of Systems:  GEN- denies fatigue, fever, weight loss,weakness, recent illness HEENT- denies eye drainage, change in vision, nasal discharge, CVS- denies chest pain, palpitations RESP- denies SOB, cough, wheeze ABD- denies N/V, change in stools, abd pain GU- denies dysuria, hematuria, dribbling, incontinence MSK- + joint pain, muscle aches, injury Neuro- denies headache, dizziness, syncope, seizure activity       Objective:    BP 122/78 mmHg  Pulse 78  Temp(Src) 98 F (36.7 C) (Oral)  Resp 14  Ht 5\' 4"  (1.626 m)  Wt 156 lb (70.761 kg)  BMI 26.76 kg/m2  LMP 06/18/2013 GEN- NAD, alert and oriented x3 HEENT- PERRL, EOMI, non injected sclera, pink conjunctiva, MMM, oropharynx clear Neck- Supple, no thyromegaly CVS- RRR, no murmur RESP-CTAB EXT- No edema Pulses- Radial, DP- 2+        Assessment & Plan:      Problem List Items Addressed This Visit    None      Note: This dictation was prepared with Dragon dictation along with smaller phrase technology. Any transcriptional  errors that result from this process are unintentional.

## 2015-10-04 NOTE — Patient Instructions (Signed)
Continue current medication  We will call with lab results F/U 4 months  

## 2015-10-05 LAB — MICROALBUMIN / CREATININE URINE RATIO
Creatinine, Urine: 32 mg/dL (ref 20–320)
MICROALB UR: 0.4 mg/dL
Microalb Creat Ratio: 13 mcg/mg creat (ref <30)

## 2015-10-05 LAB — LDL CHOLESTEROL, DIRECT: Direct LDL: 95 mg/dL (ref <130)

## 2015-10-05 LAB — HEMOGLOBIN A1C
HEMOGLOBIN A1C: 7.6 % — AB (ref <5.7)
Mean Plasma Glucose: 171 mg/dL

## 2015-10-18 ENCOUNTER — Ambulatory Visit: Payer: Self-pay | Admitting: Nutrition

## 2015-10-18 ENCOUNTER — Telehealth: Payer: Self-pay | Admitting: Family Medicine

## 2015-10-18 DIAGNOSIS — E1100 Type 2 diabetes mellitus with hyperosmolarity without nonketotic hyperglycemic-hyperosmolar coma (NKHHC): Secondary | ICD-10-CM

## 2015-10-18 NOTE — Telephone Encounter (Signed)
[phone] Patient calling to ask a question about insulin med and also about getting a weight loss med called contrave

## 2015-10-19 ENCOUNTER — Other Ambulatory Visit: Payer: Self-pay | Admitting: Family Medicine

## 2015-10-19 NOTE — Telephone Encounter (Signed)
She can start 5 units of novolog or Humulog which ever is covered with her 2 largest meals as she does not typically eat three times a day She needs to see a new endocrinologist if she doesn't want to return to Dr. Vickie Epley to place referral

## 2015-10-19 NOTE — Telephone Encounter (Signed)
Refill appropriate and filled per protocol. 

## 2015-10-19 NOTE — Telephone Encounter (Signed)
Returned call to patient.   Patient reports that she is not returning to Dr. Dorris Fetch.   MD please advise.

## 2015-10-19 NOTE — Telephone Encounter (Signed)
Supposed to follow-up for a visit with endocrinology who is managing her diabetes. I do not recommend any weight loss medication at this time and with her current medications there is contraindication

## 2015-10-19 NOTE — Telephone Encounter (Signed)
Call placed to patient.   Patient requesting meal time insulin since FSBS is fluctuating.   Last readings from meter are as follows: [phone].  Patient also requesting order for Contrave. States that she feels like she will be able to control FSBS better if she can control her weight.

## 2015-10-20 ENCOUNTER — Encounter: Payer: Self-pay | Admitting: Family Medicine

## 2015-10-21 NOTE — Telephone Encounter (Signed)
Patient aware of providers recommendations via mychart

## 2015-10-25 MED ORDER — INSULIN ASPART 100 UNIT/ML FLEXPEN: 5.0000 [IU] | PEN_INJECTOR | Freq: Two times a day (BID) | SUBCUTANEOUS | 11 refills | Status: DC

## 2015-10-25 NOTE — Telephone Encounter (Signed)
Patient returned call and made aware.   Prescription sent to pharmacy.  

## 2015-10-26 ENCOUNTER — Other Ambulatory Visit: Payer: Self-pay | Admitting: Family Medicine

## 2015-10-27 ENCOUNTER — Telehealth: Payer: Self-pay | Admitting: Family Medicine

## 2015-10-27 MED ORDER — INSULIN ASPART 100 UNIT/ML FLEXPEN: PEN_INJECTOR | SUBCUTANEOUS | 11 refills | Status: DC

## 2015-10-27 MED ORDER — SAXAGLIPTIN HCL 5 MG PO TABS: 5.0000 mg | ORAL_TABLET | Freq: Every day | ORAL | 0 refills | Status: DC

## 2015-10-27 MED ORDER — HYDROCODONE-ACETAMINOPHEN 10-325 MG PO TABS: 1.0000 | ORAL_TABLET | Freq: Four times a day (QID) | ORAL | 0 refills | Status: DC | PRN

## 2015-10-27 MED ORDER — LEVOTHYROXINE SODIUM 88 MCG PO TABS: 88.0000 ug | ORAL_TABLET | Freq: Every day | ORAL | 0 refills | Status: DC

## 2015-10-27 MED ORDER — INSULIN PEN NEEDLE 31G X 6 MM MISC: 1 refills | Status: DC

## 2015-10-27 NOTE — Telephone Encounter (Signed)
Okay to change instructions

## 2015-10-27 NOTE — Telephone Encounter (Signed)
New order for insulin sent as requested

## 2015-10-27 NOTE — Telephone Encounter (Signed)
Pt says if her Humalog insulin says 5 units BID with meals it is too expensive.  The pharmacist said if it were stated 5-60 units BID with meals per sliding scale some how it will be cheaper???  OK to send order that way?

## 2015-11-01 ENCOUNTER — Telehealth: Payer: Self-pay | Admitting: Family Medicine

## 2015-11-01 NOTE — Telephone Encounter (Signed)
Refill request denied.   Prescription printed on 10/28/2015 with note to fill on 11/03/2015.

## 2015-11-01 NOTE — Telephone Encounter (Signed)
Patient calling to get rx for her hydrocodone  [phone]

## 2015-11-10 ENCOUNTER — Other Ambulatory Visit: Payer: Self-pay | Admitting: Family Medicine

## 2015-11-10 MED ORDER — ATORVASTATIN CALCIUM 20 MG PO TABS: 20.0000 mg | ORAL_TABLET | Freq: Every day | ORAL | 0 refills | Status: DC

## 2015-11-10 NOTE — Telephone Encounter (Signed)
Refill appropriate and filled per protocol. 

## 2015-11-17 ENCOUNTER — Encounter: Payer: Self-pay | Admitting: Endocrinology

## 2015-11-17 ENCOUNTER — Ambulatory Visit (INDEPENDENT_AMBULATORY_CARE_PROVIDER_SITE_OTHER): Payer: BLUE CROSS/BLUE SHIELD | Admitting: Endocrinology

## 2015-11-17 VITALS — BP 120/78 | HR 98 | Wt 160.0 lb

## 2015-11-17 DIAGNOSIS — Z794 Long term (current) use of insulin: Secondary | ICD-10-CM | POA: Diagnosis not present

## 2015-11-17 DIAGNOSIS — E039 Hypothyroidism, unspecified: Secondary | ICD-10-CM

## 2015-11-17 DIAGNOSIS — E1142 Type 2 diabetes mellitus with diabetic polyneuropathy: Secondary | ICD-10-CM

## 2015-11-17 DIAGNOSIS — E1165 Type 2 diabetes mellitus with hyperglycemia: Secondary | ICD-10-CM | POA: Diagnosis not present

## 2015-11-17 DIAGNOSIS — R635 Abnormal weight gain: Secondary | ICD-10-CM

## 2015-11-17 DIAGNOSIS — N289 Disorder of kidney and ureter, unspecified: Secondary | ICD-10-CM | POA: Diagnosis not present

## 2015-11-17 LAB — TSH: TSH: 2.71 u[IU]/mL (ref 0.35–4.50)

## 2015-11-17 LAB — BASIC METABOLIC PANEL
BUN: 19 mg/dL (ref 6–23)
CHLORIDE: 109 meq/L (ref 96–112)
CO2: 23 mEq/L (ref 19–32)
Calcium: 9.3 mg/dL (ref 8.4–10.5)
Creatinine, Ser: 1.61 mg/dL — ABNORMAL HIGH (ref 0.40–1.20)
GFR: 43.46 mL/min — ABNORMAL LOW (ref >60.00)
Glucose, Bld: 68 mg/dL — ABNORMAL LOW (ref 70–99)
POTASSIUM: 4 meq/L (ref 3.5–5.1)
Sodium: 139 mEq/L (ref 135–145)

## 2015-11-17 LAB — T4, FREE: FREE T4: 0.88 ng/dL (ref 0.60–1.60)

## 2015-11-17 MED ORDER — VICTOZA 18 MG/3ML ~~LOC~~ SOPN: 1.2000 mg | PEN_INJECTOR | Freq: Every day | SUBCUTANEOUS | 3 refills | Status: DC

## 2015-11-17 NOTE — Patient Instructions (Addendum)
Check blood sugars on waking up  5x per week  Also check blood sugars about 2 hours after a meal and do this after different meals by rotation  Recommended blood sugar levels on waking up is 90-130 and about 2 hours after meal is 130-160  Please bring your blood sugar monitor to each visit, thank you  Stop glipizide and Onglyza  LANTUS insulin:  Change this to 24 units in the morning daily  Reduce NovoLog to 8 units twice a day  Start VICTOZA injection as shown once daily at the same time of the day.  Dial the dose to 0.6 mg on the pen for the first week.  You may inject in the stomach, thigh or arm. You may experience nausea in the first few days which usually goes away.  You will feel fullness of the stomach with starting the medication and should try to keep the portions at meals small. After 1 week increase the dose to 1.2mg  daily if no nausea present.    If any questions or concerns are present call the office or the [facility] helpline at [phone]. Visit [email or web address].info/ for more useful information  Increase walking, start walking daily in the morning  Cut back on high-fat foods including meats and biscuits and have balanced low fat meals with some protein

## 2015-11-17 NOTE — Progress Notes (Signed)
Patient ID: Kathryn Oconnell, female   DOB: 02-Nov-1965, 50 y.o.   MRN: [id number]           Reason for Appointment: Consultation for Type 2 Diabetes  Referring physician: Dr. Buelah Manis     History of Present Illness:          Date of diagnosis of type 2 diabetes mellitus: 2000        Background history:   She had been on metformin for a few years but this was stopped in 1/16 when her renal function was impaired. She says she had been doing fairly well with this Trulicity 6.75 mg weekly In 10/16 she had significant hyperglycemia while taking oral hypoglycemic drugs and she was started on insulin She was switched back to oral agents with glipizide and Onglyza in 1/17 However subsequently blood sugars started going up and she was back on insulin in April 2017  Recent history:   INSULIN regimen is: Lantus 19 bid, Novolog 10 bid for breakfast and supper       Non-insulin hypoglycemic drugs the patient is taking are: Glipizide 10 twice a day, Onglyza 5 mg daily  Current management, blood sugar patterns and problems identified:  The patient is mostly concerned about her weight gain with starting insulin.  She thinks she has gained about 20 pounds this year and about 4 pounds and/month  She also appears to be getting frequent low blood sugars in the mornings and she thinks her blood sugars are not above the 90 usually and periodically in the 40s also.  Her monitor download however shows only one documented morning reading of 120  She is checking her blood sugars with different glucose monitors and is bringing only one of them for download today  Although she does not usually eat lunch even on weekends she has a glucose of 190 at 4 PM  Has a few readings after supper which range from 66-178 but are generally low normal.  She thinks she gets low sugars after supper about twice a week  However she thinks that her sugars after breakfast are about 170  He will know she says she had a  discussion with the dietitian in June she continues to have high-fat meals especially with fast food biscuits in the morning  She exercises only twice a week, mostly walking on treadmill in the evening, not able to be irregular because of long work hours           Side effects from medications have been: None  Compliance with the medical regimen: Fair HYPOGLYCEMIA: May feel shaky when symptomatic, does not always have symptoms with low sugar readings  Glucose monitoring:  done  times a day         Glucometer:  contour and Accu-Chek     Blood Glucose readings by time of day and averages from meter download:  PREMEAL Breakfast Lunch Dinner Bedtime  Overall   Glucose range: 40-120 ? 190 46-176   Median:        Self-care: The diet that the patient has been following is: None. Meal times are:  Breakfast is at 10 AM, Lunch: Usually none Dinner: 7-8 PM  Typical meal intake: Breakfast is at 10 am, usually eating fast food biscuits.  Evening meal is meat or chicken with potatoes, greens, macaroni.  Snacks on chips.  She will drink water and some juice, usually diet drinks  Dietician visit, most recent: 6/17               Exercise:  walking on treadmill about 1-2/7 days, about 30 minutes in the evening  Weight history: Previous range 119-196  Wt Readings from Last 3 Encounters:  11/17/15 160 lb (72.6 kg)  10/04/15 156 lb (70.8 kg)  09/23/15 155 lb (70.3 kg)    Glycemic control:   Lab Results  Component Value Date   HGBA1C 7.6 (H) 10/04/2015   HGBA1C 7.5 07/24/2015   HGBA1C 7.5 (H) 07/05/2015   Lab Results  Component Value Date   MICROALBUR 0.4 10/04/2015   LDLCALC 92 03/24/2015   CREATININE 1.60 (H) 07/05/2015   Lab Results  Component Value Date   MICRALBCREAT 13 10/04/2015         Medication List       Accurate as of 11/17/15  4:20 PM. Always use your most recent med list.          adapalene 0.1 % gel Commonly known as:  DIFFERIN 2 (two) times  daily.   albuterol 108 (90 Base) MCG/ACT inhaler Commonly known as:  PROVENTIL HFA;VENTOLIN HFA Inhale 2 puffs into the lungs every 6 (six) hours as needed for wheezing or shortness of breath.   aspirin 81 MG tablet Take 81 mg by mouth daily.   atorvastatin 20 MG tablet Commonly known as:  LIPITOR Take 1 tablet (20 mg total) by mouth daily.   chlorhexidine 0.12 % solution Commonly known as:  PERIDEX Use as directed 15 mLs in the mouth or throat 2 (two) times daily.   CONTOUR NEXT EZ MONITOR w/Device Kit Use to monitor FSBS 2x daily. Dx: E11.9.   cyclobenzaprine 10 MG tablet Commonly known as:  FLEXERIL Take 1 tablet (10 mg total) by mouth 3 (three) times daily as needed.   fluticasone 50 MCG/ACT nasal spray Commonly known as:  FLONASE Place 2 sprays into the nose daily. For sinuses/allergies   glucose blood test strip Commonly known as:  BAYER CONTOUR NEXT TEST Use to monitor FSBS 2x daily. Dx: E11.9.   HYDROcodone-acetaminophen 10-325 MG tablet Commonly known as:  NORCO Take 1 tablet by mouth every 6 (six) hours as needed.   hydrOXYzine 25 MG tablet Commonly known as:  ATARAX/VISTARIL Take 1 tablet (25 mg total) by mouth 3 (three) times daily as needed.   insulin aspart 100 UNIT/ML FlexPen Commonly known as:  NOVOLOG FLEXPEN Inject 5 to 60 units twice daily with meals as needed per sliding scale.   Insulin Pen Needle 31G X 6 MM Misc Commonly known as:  RELION MINI PEN NEEDLES Use to administer insulin as directed.   LANTUS SOLOSTAR [place] Inject into the skin. 19 units in the morning and 19 units at night   Insulin Glargine 100 UNIT/ML Solostar Pen Commonly known as:  LANTUS SOLOSTAR INJECT 10-50 UNITS SUBCUTANEOUSLY ONCE DAILY  OR AS DIRECTED   levothyroxine 88 MCG tablet Commonly known as:  SYNTHROID, LEVOTHROID Take 1 tablet (88 mcg total) by mouth daily.   LORazepam 0.5 MG tablet Commonly known as:  ATIVAN 1 tab every 8 hours as needed     medroxyPROGESTERone 2.5 MG tablet Commonly known as:  PROVERA Take 1 tablet (2.5 mg total) by mouth daily.   metoprolol tartrate 25 MG tablet Commonly known as:  LOPRESSOR Take 0.5 tablets (12.5 mg total) by mouth 2 (two) times daily.   ONE TOUCH LANCETS Misc Use to monitor FSBS 1x daily. Dx: E11.9.  valACYclovir 1000 MG tablet Commonly known as:  VALTREX Take 1 tablet (1,000 mg total) by mouth daily.   VICTOZA 18 MG/3ML Sopn Generic drug:  Liraglutide Inject 0.2 mLs (1.2 mg total) into the skin daily. Inject once daily at the same time   zolpidem 12.5 MG CR tablet Commonly known as:  AMBIEN CR Take 1 tablet (12.5 mg total) by mouth at bedtime as needed for sleep.       Allergies:  Allergies  Allergen Reactions  . Erythromycin   . Imitrex [Sumatriptan]   . Sulfa Antibiotics Other (See Comments)    Causes stomach cramps  . Topamax [Topiramate]     Nausea and worsening    Past Medical History:  Diagnosis Date  . Anxiety   . BV (bacterial vaginosis) 06/03/2013  . Chronic kidney disease   . COPD (chronic obstructive pulmonary disease) ([place])   . Depression   . Diabetes mellitus without complication ([place])   . Eczema   . Graves disease   . HSV-2 infection   . Hyperlipidemia   . Hypertension   . Hypothyroid   . Labial lesion 06/25/2015  . LLQ pain 02/18/2014  . Lung nodule   . LUQ pain 02/18/2014  . Menopausal vaginal dryness 02/24/2014  . Migraines   . Neuropathy ([place])   . Tobacco use   . Trouble in sleeping 06/25/2015  . Unspecified symptom associated with female genital organs 06/03/2013  . Vaginal discharge 06/03/2013  . Weight loss 02/18/2014  . Yeast infection 06/17/2013    Past Surgical History:  Procedure Laterality Date  . CESAREAN SECTION    . FOOT SURGERY     5th digit left foot / Great toe, 2ND TOE  . THYROID SURGERY      Family History  Problem Relation Age of Onset  . Stroke Mother     x 3  . Heart disease Mother   . Cancer Mother      breast   . Thyroid disease Mother   . Hyperlipidemia Mother   . Heart disease Father   . Cancer Father     stomach cancer   . Hypertension Father   . Heart attack Father   . Gout Brother   . Migraines Son   . Hypertension Paternal Uncle   . Heart disease Maternal Grandmother   . Heart attack Maternal Grandmother   . Heart disease Maternal Grandfather   . Heart attack Maternal Grandfather   . Cancer Paternal Grandmother   . Diabetes Paternal Grandfather   . Asthma Daughter     Social History:  reports that she has been smoking Cigarettes.  She has a 17.50 pack-year smoking history. She has never used smokeless tobacco. She reports that she does not drink alcohol or use drugs.   Review of Systems  Constitutional: Positive for weight gain. Negative for reduced appetite.  HENT: Positive for headaches.        Periodic migraines  Eyes: Negative for blurred vision.  Respiratory: Negative for shortness of breath.   Cardiovascular: Negative for leg swelling.  Gastrointestinal: Positive for constipation. Negative for diarrhea and abdominal pain.  Endocrine: Positive for fatigue and cold intolerance.       She had radioactive iodine for Graves' disease several years ago.  Prior to that had right lobectomy for goiter.  Her levothyroxine dose has been either 75 or 100 ug  Genitourinary: Negative for nocturia.  Musculoskeletal: Negative for joint pain.  Skin: Negative for itching.  Neurological: Positive for numbness  and tingling.       Soreness, pain  Psychiatric/Behavioral: Positive for nervousness and insomnia.     Lipid history: Currently on Lipitor 20 mg    Lab Results  Component Value Date   CHOL 140 03/24/2015   HDL 30 (L) 03/24/2015   LDLCALC 92 03/24/2015   LDLDIRECT 95 10/04/2015   TRIG 91 03/24/2015   CHOLHDL 4.7 03/24/2015           Hypertension:mild And on metoprolol alone  Most recent eye exam was In 1/17  Most recent foot exam: 8/17  Lab Results    Component Value Date   TSH 2.795 03/24/2015   TSH 2.053 07/28/2014   TSH 8.026 (H) 03/25/2014   FREET4 1.17 03/24/2015   FREET4 1.07 07/28/2014   FREET4 1.11 03/25/2014    LABS:  No visits with results within 1 Week(s) from this visit.  Latest known visit with results is:  Office Visit on 10/04/2015  Component Date Value Ref Range Status  . Creatinine, Urine 10/05/2015 32  20 - 320 mg/dL Final  . Microalb, Ur 10/05/2015 0.4  Not estab mg/dL Final  . Microalb Creat Ratio 10/05/2015 13  <30 mcg/mg creat Final   Comment: The ADA has defined abnormalities in albumin excretion as follows:           Category           Result                            (mcg/mg creatinine)                 Normal:    <30       Microalbuminuria:    30 - 299   Clinical albuminuria:    > or = 300   The ADA recommends that at least two of three specimens collected within a 3 - 6 month period be abnormal before considering a patient to be within a diagnostic category.     . Hgb A1c MFr Bld 10/05/2015 7.6* <5.7 % Final   Comment:   For someone without known diabetes, a hemoglobin A1c value of 6.5% or greater indicates that they may have diabetes and this should be confirmed with a follow-up test.   For someone with known diabetes, a value <7% indicates that their diabetes is well controlled and a value greater than or equal to 7% indicates suboptimal control. A1c targets should be individualized based on duration of diabetes, age, comorbid conditions, and other considerations.   Currently, no consensus exists for use of hemoglobin A1c for diagnosis of diabetes for children.     . Mean Plasma Glucose 10/05/2015 171  mg/dL Final  . Direct LDL 10/05/2015 95  <130 mg/dL Final   Comment:   Desirable range <100 mg/dL for patients with CHD or diabetes and <70 mg/dL for diabetic patients with known heart disease.       Physical Examination:  BP 120/78   Pulse 98   Wt 160 lb (72.6 kg)   LMP  06/18/2013 Comment: irregular  BMI 27.46 kg/m   GENERAL:         Patient has mild generalized obesity.   HEENT:         Eye exam shows normal external appearance. Fundus exam shows no retinopathy. Oral exam shows normal mucosa .  NECK:   There is no lymphadenopathy Thyroid is not enlarged and no nodules felt.  Carotids are  normal to palpation and no bruit heard LUNGS:         Chest is symmetrical. Lungs are clear to auscultation.Marland Kitchen   HEART:         Heart sounds:  S1 and S2 are normal. No murmur or click heard., no S3 or S4.   ABDOMEN:   There is no distention present. Liver and spleen are not palpable. No other mass or tenderness present.   NEUROLOGICAL:   Ankle jerks are absent bilaterally.    Diabetic Foot Exam - Simple   Simple Foot Form Diabetic Foot exam was performed with the following findings:  Yes 11/17/2015  4:01 PM  Visual Inspection No deformities, no ulcerations, no other skin breakdown bilaterally:  Yes Sensation Testing Intact to touch and monofilament testing bilaterally:  Yes Pulse Check Posterior Tibialis and Dorsalis pulse intact bilaterally:  Yes Comments Callus Rt posterior lateral heel area            Vibration sense is mildly  reduced in distal first toes. MUSCULOSKELETAL:  There is no swelling or deformity of the peripheral joints. Spine is normal to inspection.   EXTREMITIES:     There is no edema. No skin lesions present.Marland Kitchen SKIN:       No rash or lesions of concern.        ASSESSMENT:  Diabetes type 2, uncontrolled   The patient has recently been on basal bolus insulin regimen with glipizide and Onglyza and fair control Unable to review her blood sugars completely as she is only bringing in one of her multiple glucose monitors for review However the main problem appears to be frequent hypoglycemia and low normal fasting readings consistently Also has periodic hypoglycemia after her evening meal Her diet is suboptimal with getting fairly high fat meals and  snacks She is concerned about the weight gain which is occurring because of the relatively high insulin dose and high calorie diet She is not exercising much  Complications of diabetes:None evident on exam but she has some symptoms of neuropathy, relatively mild  POST ablative hypothyroidism: She has not had a thyroid level checked this year and she is complaining of some weight gain and cold intolerance  History of hyperlipidemia: Well controlled  RENAL dysfunction: Last creatinine 1.6 but had been previously normal for some time, at one point was taking Motrin apparently when renal function was abnormal  PLAN:     Since she previously had benefited from metformin will need to reevaluate her renal function today to see if she can go back on metformin  Stop glipizide and Onglyza as these aren't likely to be helping at this stage  Reduce LANTUS to once a day and she can take this in the morning starting with 24 units. Also since she had previously benefited from Trulicity she will start a GLP-1 drug.   Currently Donna Bernard is referred by her insurance. Discussed with the patient the nature of GLP-1 drugs, the actions on various organ systems, how they benefit blood glucose control, as well as the benefit of weight loss and  increase satiety . Explained possible side effects especially nausea and vomiting initially; discussed safety information in package insert.  Described the injection technique and dosage titration of Victoza  starting with 0.6 mg once a day at the same time for the first week and then increasing to 1.2 mg if no symptoms of nausea.  Educational brochure on Victoza and co-pay card given  Start checking blood sugars consistently with the same  glucose monitor  Avoid high-fat meals especially at fast food restaurants in the morning  Start exercising at least 3-4 days a week  Reduce NovoLog to 8 units twice a day  Consultation with dietitian  Follow-up in 3 weeks  Recheck  thyroid levels  Patient Instructions  Check blood sugars on waking up  5x per week  Also check blood sugars about 2 hours after a meal and do this after different meals by rotation  Recommended blood sugar levels on waking up is 90-130 and about 2 hours after meal is 130-160  Please bring your blood sugar monitor to each visit, thank you  Stop glipizide and Onglyza  LANTUS insulin:  Change this to 24 units in the morning daily  Reduce NovoLog to 8 units twice a day  Start VICTOZA injection as shown once daily at the same time of the day.  Dial the dose to 0.6 mg on the pen for the first week.  You may inject in the stomach, thigh or arm. You may experience nausea in the first few days which usually goes away.  You will feel fullness of the stomach with starting the medication and should try to keep the portions at meals small. After 1 week increase the dose to 1.53m daily if no nausea present.    If any questions or concerns are present call the office or the [facility] at [phone] Visit [email or web address] more useful information  Increase walking, start walking daily in the morning  Cut back on high-fat foods including meats and biscuits and have balanced low fat meals with some protein       Counseling time on subjects discussed above is over 50% of today's 60 minute visit   Onofre Gains 11/17/2015, 4:20 PM   Note: This office note was prepared with [place]state agent Any transcriptional errors that result from this process are unintentional.

## 2015-11-29 ENCOUNTER — Other Ambulatory Visit: Payer: Self-pay | Admitting: Family Medicine

## 2015-11-29 ENCOUNTER — Encounter: Payer: Self-pay | Admitting: Family Medicine

## 2015-11-30 ENCOUNTER — Other Ambulatory Visit: Payer: Self-pay | Admitting: Family Medicine

## 2015-11-30 MED ORDER — HYDROCODONE-ACETAMINOPHEN 10-325 MG PO TABS: 1.0000 | ORAL_TABLET | Freq: Four times a day (QID) | ORAL | 0 refills | Status: DC | PRN

## 2015-12-01 ENCOUNTER — Other Ambulatory Visit: Payer: Self-pay | Admitting: Orthopedic Surgery

## 2015-12-01 ENCOUNTER — Ambulatory Visit: Payer: Self-pay | Admitting: Orthopedic Surgery

## 2015-12-03 ENCOUNTER — Other Ambulatory Visit: Payer: Self-pay | Admitting: Family Medicine

## 2015-12-03 NOTE — Telephone Encounter (Signed)
Last OV 10-04-15 Last refill 08-27-15 Ok to refill?

## 2015-12-06 NOTE — Telephone Encounter (Signed)
Okay 

## 2015-12-08 ENCOUNTER — Encounter: Payer: Self-pay | Admitting: Orthopedic Surgery

## 2015-12-08 ENCOUNTER — Ambulatory Visit (INDEPENDENT_AMBULATORY_CARE_PROVIDER_SITE_OTHER): Payer: BLUE CROSS/BLUE SHIELD | Admitting: Orthopedic Surgery

## 2015-12-08 DIAGNOSIS — M65311 Trigger thumb, right thumb: Secondary | ICD-10-CM | POA: Diagnosis not present

## 2015-12-08 NOTE — Patient Instructions (Signed)

## 2015-12-08 NOTE — Progress Notes (Signed)
Chief complaint pain right thumb   HPI   50 year old female presents for evaluation of right thumb triggering. She received an injection from Dr. Fredna Dow in June. She got mild relief symptoms are back she doesn't want have surgery she presents for another opinion  Complains of pain over the A1 pulley locking and catching of the right thumb mild swelling.  Review of Systems  Constitutional: Negative for chills, fever and weight loss.  Respiratory: Negative for shortness of breath.   Cardiovascular: Negative for chest pain.  Neurological: Negative for tingling.    Past Medical History:  Diagnosis Date  . Anxiety   . BV (bacterial vaginosis) 06/03/2013  . Chronic kidney disease   . COPD (chronic obstructive pulmonary disease) ([place])   . Depression   . Diabetes mellitus without complication ([place])   . Eczema   . Graves disease   . HSV-2 infection   . Hyperlipidemia   . Hypertension   . Hypothyroid   . Labial lesion 06/25/2015  . LLQ pain 02/18/2014  . Lung nodule   . LUQ pain 02/18/2014  . Menopausal vaginal dryness 02/24/2014  . Migraines   . Neuropathy ([place])   . Tobacco use   . Trouble in sleeping 06/25/2015  . Unspecified symptom associated with female genital organs 06/03/2013  . Vaginal discharge 06/03/2013  . Weight loss 02/18/2014  . Yeast infection 06/17/2013    Past Surgical History:  Procedure Laterality Date  . CESAREAN SECTION    . FOOT SURGERY     5th digit left foot / Great toe, 2ND TOE  . THYROID SURGERY     Family History  Problem Relation Age of Onset  . Stroke Mother     x 3  . Heart disease Mother   . Cancer Mother     breast   . Thyroid disease Mother   . Hyperlipidemia Mother   . Heart disease Father   . Cancer Father     stomach cancer   . Hypertension Father   . Heart attack Father   . Gout Brother   . Migraines Son   . Hypertension Paternal Uncle   . Heart disease Maternal Grandmother   . Heart attack Maternal Grandmother   . Heart disease  Maternal Grandfather   . Heart attack Maternal Grandfather   . Cancer Paternal Grandmother   . Diabetes Paternal Grandfather   . Asthma Daughter    Social History  Substance Use Topics  . Smoking status: Current Every Day Smoker    Packs/day: 0.50    Years: 35.00    Types: Cigarettes  . Smokeless tobacco: Never Used  . Alcohol use No   No outpatient prescriptions have been marked as taking for the 12/08/15 encounter (Appointment) with Carole Civil, MD.    LMP 06/18/2013 Comment: irregular  Physical Exam  Constitutional: She is oriented to person, place, and time. She appears well-developed and well-nourished. No distress.  Cardiovascular: Normal rate and intact distal pulses.   Neurological: She is alert and oriented to person, place, and time. She has normal reflexes. She exhibits normal muscle tone. Coordination normal.  Skin: Skin is warm and dry. No rash noted. She is not diaphoretic. No erythema. No pallor.  Psychiatric: She has a normal mood and affect. Her behavior is normal. Judgment and thought content normal.    Ortho Exam  She has tenderness over the A1 pulley of the right thumb clicking and popping but no locking. No instability the flexor tendon strength is  normal skin is intact the joint is stable to color is normal epitrochlear lymph nodes are negative and she has normal sensation in the hand  The left thumb is nontender ASSESSMENT: My personal interpretation of the images:   I 2 recommended surgery but she wants to try another injection PLAN Inject right thumb for triggering  Right Trigger thumb injection Medication  1 mL of 40 mg Depo-Medrol  2 mL of 1% lidocaine plain  Ethyl chloride for anesthesia  Verbal consent was obtained timeout was taken to confirm the injection site as right thumb  Alcohol was used to prepare the skin along with ethyl chloride and then the injection was made at the A1 pulley there were no complications   Arther Abbott, MD 12/08/2015 2:23 PM  .meds

## 2015-12-12 ENCOUNTER — Other Ambulatory Visit: Payer: Self-pay | Admitting: Family Medicine

## 2015-12-12 ENCOUNTER — Encounter: Payer: Self-pay | Admitting: Family Medicine

## 2015-12-13 ENCOUNTER — Encounter: Payer: Self-pay | Admitting: Family Medicine

## 2015-12-13 MED ORDER — METOPROLOL TARTRATE 25 MG PO TABS: 12.5000 mg | ORAL_TABLET | Freq: Two times a day (BID) | ORAL | 0 refills | Status: DC

## 2015-12-13 MED ORDER — LORAZEPAM 0.5 MG PO TABS: ORAL_TABLET | ORAL | 2 refills | Status: DC

## 2015-12-13 MED ORDER — CYCLOBENZAPRINE HCL 10 MG PO TABS: 10.0000 mg | ORAL_TABLET | Freq: Three times a day (TID) | ORAL | 0 refills | Status: DC | PRN

## 2015-12-14 ENCOUNTER — Encounter: Payer: Self-pay | Admitting: Endocrinology

## 2015-12-14 ENCOUNTER — Ambulatory Visit (INDEPENDENT_AMBULATORY_CARE_PROVIDER_SITE_OTHER): Payer: BLUE CROSS/BLUE SHIELD | Admitting: Endocrinology

## 2015-12-14 VITALS — BP 120/82 | HR 74 | Wt 158.0 lb

## 2015-12-14 DIAGNOSIS — Z794 Long term (current) use of insulin: Secondary | ICD-10-CM

## 2015-12-14 DIAGNOSIS — E039 Hypothyroidism, unspecified: Secondary | ICD-10-CM | POA: Diagnosis not present

## 2015-12-14 DIAGNOSIS — N289 Disorder of kidney and ureter, unspecified: Secondary | ICD-10-CM | POA: Diagnosis not present

## 2015-12-14 DIAGNOSIS — E1165 Type 2 diabetes mellitus with hyperglycemia: Secondary | ICD-10-CM | POA: Diagnosis not present

## 2015-12-14 LAB — BASIC METABOLIC PANEL
BUN: 13 mg/dL (ref 6–23)
CALCIUM: 8.9 mg/dL (ref 8.4–10.5)
CO2: 28 meq/L (ref 19–32)
CREATININE: 1.41 mg/dL — AB (ref 0.40–1.20)
Chloride: 105 mEq/L (ref 96–112)
GFR: 50.63 mL/min — ABNORMAL LOW (ref >60.00)
GLUCOSE: 185 mg/dL — AB (ref 70–99)
Potassium: 4.4 mEq/L (ref 3.5–5.1)
Sodium: 135 mEq/L (ref 135–145)

## 2015-12-14 NOTE — Patient Instructions (Signed)
  Reduce LANTUS to 20 units  Check blood sugars on waking up  3x weekly  Also check blood sugars about 2 hours after a meal and do this after different meals by rotation  Recommended blood sugar levels on waking up is 90-130 and about 2 hours after meal is 130-160  Please bring your blood sugar monitor to each visit, thank you

## 2015-12-14 NOTE — Progress Notes (Signed)
Patient ID: Kathryn Oconnell, female   DOB: Dec 13, 1965, 50 y.o.   MRN: [id number]           Reason for Appointment: Follow-up for Type 2 Diabetes  Referring physician: Dr. Buelah Manis   History of Present Illness:          Date of diagnosis of type 2 diabetes mellitus: 2000        Background history:   She had been on metformin for a few years but this was stopped in 1/16 when her renal function was impaired. She says she had been doing fairly well with this Trulicity 4.76 mg weekly In 10/16 she had significant hyperglycemia while taking oral hypoglycemic drugs and she was started on insulin She was switched back to oral agents with glipizide and Onglyza in 1/17 However subsequently blood sugars started going up and she was back on insulin in April 2017  Recent history:   INSULIN regimen is: LANTUS 24 in am, Novolog recently  Non-insulin hypoglycemic drugs the patient is taking are: Victoza 1.2 mg daily  Current management, blood sugar patterns and problems identified:  On her last visit because of tendency to low blood sugars and weight gain with her basal bolus insulin regimen she was started on Victoza and Lantus changed to morning injection only  Although she was supposed to continue NovoLog 8 units twice a day she has not taken this lately cause her sugars were improving  However the patient has check blood sugars infrequently  She has only a few readings in the mornings which appear to be near normal, lowest 79 last month  She is checking blood sugars infrequently after meals especially supper with only one relatively  high reading of 188  HYPOGLYCEMIA: She is tending to have some low sugars recently sporadically with some low readings either at 6 PM or 10 PM including last Sunday  Her weight has come down a couple of pounds also, she thinks she is eating less with starting Victoza  She is not exercising enough because of her work hours and schedule           Side effects  from medications have been: None  Compliance with the medical regimen: Fair HYPOGLYCEMIA: May feel shaky when symptomatic, does not always have symptoms with low sugar readings  Glucose monitoring:  done 1 times a day         Glucometer:  contour  Blood Glucose readings by time of day and averages from meter download:  Mean values apply above for all meters except median for One Touch  /PRE-MEAL Fasting Lunch Dinner 10 PM  Overall  Glucose range: 79-121   52  59-188    Mean/median:    110  110      Self-care: The diet that the patient has been following is: Reducing fats. Meal times are:  Breakfast is at 10 AM, Lunch: Usually none Dinner: 7-8 PM  Typical meal intake: Breakfast is at 10 am, rarely eating fast food biscuits.  Evening meal is meat or chicken with potatoes, greens, macaroni.  Snacks on chips.  She will drink water and some juice, usually diet drinks                Dietician visit, most recent: 6/17               Exercise:  walking on treadmill about 1-2/7 days, about 30 minutes in the evening  Weight history: Previous range 119-196  Wt Readings from  Last 3 Encounters:  12/14/15 158 lb (71.7 kg)  11/17/15 160 lb (72.6 kg)  10/04/15 156 lb (70.8 kg)    Glycemic control:   Lab Results  Component Value Date   HGBA1C 7.6 (H) 10/04/2015   HGBA1C 7.5 07/24/2015   HGBA1C 7.5 (H) 07/05/2015   Lab Results  Component Value Date   MICROALBUR 0.4 10/04/2015   LDLCALC 92 03/24/2015   CREATININE 1.41 (H) 12/14/2015   Lab Results  Component Value Date   MICRALBCREAT 13 10/04/2015         Medication List       Accurate as of 12/14/15 11:59 PM. Always use your most recent med list.          adapalene 0.1 % gel Commonly known as:  DIFFERIN 2 (two) times daily.   albuterol 108 (90 Base) MCG/ACT inhaler Commonly known as:  PROVENTIL HFA;VENTOLIN HFA Inhale 2 puffs into the lungs every 6 (six) hours as needed for wheezing or shortness of breath.   aspirin  81 MG tablet Take 81 mg by mouth daily.   atorvastatin 20 MG tablet Commonly known as:  LIPITOR Take 1 tablet (20 mg total) by mouth daily.   chlorhexidine 0.12 % solution Commonly known as:  PERIDEX Use as directed 15 mLs in the mouth or throat 2 (two) times daily.   CONTOUR NEXT EZ MONITOR w/Device Kit Use to monitor FSBS 2x daily. Dx: E11.9.   cyclobenzaprine 10 MG tablet Commonly known as:  FLEXERIL Take 1 tablet (10 mg total) by mouth 3 (three) times daily as needed.   cyclobenzaprine 10 MG tablet Commonly known as:  FLEXERIL Take 1 tablet (10 mg total) by mouth 3 (three) times daily as needed.   fluticasone 50 MCG/ACT nasal spray Commonly known as:  FLONASE Place 2 sprays into the nose daily. For sinuses/allergies   glucose blood test strip Commonly known as:  BAYER CONTOUR NEXT TEST Use to monitor FSBS 2x daily. Dx: E11.9.   HYDROcodone-acetaminophen 10-325 MG tablet Commonly known as:  NORCO Take 1 tablet by mouth every 6 (six) hours as needed.   hydrOXYzine 25 MG tablet Commonly known as:  ATARAX/VISTARIL Take 1 tablet (25 mg total) by mouth 3 (three) times daily as needed.   insulin aspart 100 UNIT/ML FlexPen Commonly known as:  NOVOLOG FLEXPEN Inject 5 to 60 units twice daily with meals as needed per sliding scale.   Insulin Pen Needle 31G X 6 MM Misc Commonly known as:  RELION MINI PEN NEEDLES Use to administer insulin as directed.   LANTUS SOLOSTAR [place] Inject into the skin. 19 units in the morning and 19 units at night   Insulin Glargine 100 UNIT/ML Solostar Pen Commonly known as:  LANTUS SOLOSTAR INJECT 10-50 UNITS SUBCUTANEOUSLY ONCE DAILY  OR AS DIRECTED   levothyroxine 88 MCG tablet Commonly known as:  SYNTHROID, LEVOTHROID Take 1 tablet (88 mcg total) by mouth daily.   LORazepam 0.5 MG tablet Commonly known as:  ATIVAN 1 tab every 8 hours as needed   medroxyPROGESTERone 2.5 MG tablet Commonly known as:  PROVERA Take 1 tablet (2.5 mg  total) by mouth daily.   metoprolol tartrate 25 MG tablet Commonly known as:  LOPRESSOR Take 0.5 tablets (12.5 mg total) by mouth 2 (two) times daily.   ONE TOUCH LANCETS Misc Use to monitor FSBS 1x daily. Dx: E11.9.   valACYclovir 1000 MG tablet Commonly known as:  VALTREX Take 1 tablet (1,000 mg total) by mouth daily.  VICTOZA 18 MG/3ML Sopn Generic drug:  Liraglutide Inject 0.2 mLs (1.2 mg total) into the skin daily. Inject once daily at the same time   zolpidem 12.5 MG CR tablet Commonly known as:  AMBIEN CR Take 1 tablet (12.5 mg total) by mouth at bedtime as needed for sleep.       Allergies:  Allergies  Allergen Reactions  . Erythromycin   . Imitrex [Sumatriptan]   . Sulfa Antibiotics Other (See Comments)    Causes stomach cramps  . Topamax [Topiramate]     Nausea and worsening    Past Medical History:  Diagnosis Date  . Anxiety   . BV (bacterial vaginosis) 06/03/2013  . Chronic kidney disease   . COPD (chronic obstructive pulmonary disease) ([place])   . Depression   . Diabetes mellitus without complication ([place])   . Eczema   . Graves disease   . HSV-2 infection   . Hyperlipidemia   . Hypertension   . Hypothyroid   . Labial lesion 06/25/2015  . LLQ pain 02/18/2014  . Lung nodule   . LUQ pain 02/18/2014  . Menopausal vaginal dryness 02/24/2014  . Migraines   . Neuropathy ([place])   . Tobacco use   . Trouble in sleeping 06/25/2015  . Unspecified symptom associated with female genital organs 06/03/2013  . Vaginal discharge 06/03/2013  . Weight loss 02/18/2014  . Yeast infection 06/17/2013    Past Surgical History:  Procedure Laterality Date  . CESAREAN SECTION    . FOOT SURGERY     5th digit left foot / Great toe, 2ND TOE  . THYROID SURGERY      Family History  Problem Relation Age of Onset  . Stroke Mother     x 3  . Heart disease Mother   . Cancer Mother     breast   . Thyroid disease Mother   . Hyperlipidemia Mother   . Heart disease Father     . Cancer Father     stomach cancer   . Hypertension Father   . Heart attack Father   . Gout Brother   . Migraines Son   . Hypertension Paternal Uncle   . Heart disease Maternal Grandmother   . Heart attack Maternal Grandmother   . Heart disease Maternal Grandfather   . Heart attack Maternal Grandfather   . Cancer Paternal Grandmother   . Diabetes Paternal Grandfather   . Asthma Daughter     Social History:  reports that she has been smoking Cigarettes.  She has a 17.50 pack-year smoking history. She has never used smokeless tobacco. She reports that she does not drink alcohol or use drugs.   Review of Systems  RENAL function abnormalities: She has had recently high creatinine and is waiting for a referral for the nephrologist.  Lipid history: Currently on Lipitor 20 mg with adequate control    Lab Results  Component Value Date   CHOL 140 03/24/2015   HDL 30 (L) 03/24/2015   LDLCALC 92 03/24/2015   LDLDIRECT 95 10/04/2015   TRIG 91 03/24/2015   CHOLHDL 4.7 03/24/2015           Hypertension:mild And on metoprolol alone  Most recent eye exam was In 1/17  Most recent foot exam: 8/17  HYPOTHYROIDISM: She has had fairly stable thyroid levels with 88 g levothyroxine  Lab Results  Component Value Date   TSH 2.71 11/17/2015   TSH 2.795 03/24/2015   TSH 2.053 07/28/2014   FREET4 0.88 11/17/2015  FREET4 1.17 03/24/2015   FREET4 1.07 07/28/2014    LABS:  Office Visit on 12/14/2015  Component Date Value Ref Range Status  . Fructosamine 12/15/2015 292* 0 - 285 umol/L Final   Comment: Published reference interval for apparently healthy subjects between age 75 and 4 is 25 - 285 umol/L and in a poorly controlled diabetic population is 228 - 563 umol/L with a mean of 396 umol/L.   Marland Kitchen Sodium 12/14/2015 135  135 - 145 mEq/L Final  . Potassium 12/14/2015 4.4  3.5 - 5.1 mEq/L Final  . Chloride 12/14/2015 105  96 - 112 mEq/L Final  . CO2 12/14/2015 28  19 - 32 mEq/L  Final  . Glucose, Bld 12/14/2015 185* 70 - 99 mg/dL Final  . BUN 12/14/2015 13  6 - 23 mg/dL Final  . Creatinine, Ser 12/14/2015 1.41* 0.40 - 1.20 mg/dL Final  . Calcium 12/14/2015 8.9  8.4 - 10.5 mg/dL Final  . GFR 12/14/2015 50.63* >60.00 mL/min Final    Physical Examination:  BP 120/82 (BP Location: Left Arm, Patient Position: Sitting)   Pulse 74   Wt 158 lb (71.7 kg)   LMP 06/18/2013 Comment: irregular  SpO2 94%   BMI 27.12 kg/m    ASSESSMENT:  Diabetes type 2, BMI 27  See history of present illness for detailed discussion of current diabetes management, blood sugar patterns and problems identified  Although she has not checked readings consistently she appears to be benefiting significantly from adding Victoza which she is tolerating The patient was on Novolog previously which she has not taken since overall blood sugars are better with not clear if she has some high readings after meals Also since no recent fructosamine available not clear if sugars are consistently controlled However her weight is improving She is doing better on her diet and portion control She can do better with exercise HYPOGLYCEMIA: She has had a few readings in the 50s documented in the evenings which are not predictable, may be related to variable food intake during the day but likely to be from her Lantus insulin  POST ablative hypothyroidism: She has normal thyroid levels recently   RENAL dysfunction: Last creatinine 1.6 and will recheck it again, waiting for nephrology consultation. Currently not taking any nephrotoxic drugs Not clear of the etiology as she does not have any risk factors for renal disease and no microalbuminuria  PLAN:    Will reduce her LANTUS insulin as she is getting low sugars in the evenings at times, she will try 20 units  She may be able to reduce this further if her blood sugars stay down.  However needs to do more readings after meals  Discussed need to exercise  more regularly  If her fasting readings are higher than the afternoon May switch Lantus to the evening  Will recheck creatinine today, still pending nephrology consultation.  Patient Instructions   Reduce LANTUS to 20 units  Check blood sugars on waking up  3x weekly  Also check blood sugars about 2 hours after a meal and do this after different meals by rotation  Recommended blood sugar levels on waking up is 90-130 and about 2 hours after meal is 130-160  Please bring your blood sugar monitor to each visit, thank you       Addendum: Creatinine slightly better at 1.4  [facility] 12/15/2015, 12:16 PM   Note: This office note was prepared with Estate agent. Any transcriptional errors that result from this process  are unintentional.

## 2015-12-15 LAB — FRUCTOSAMINE: FRUCTOSAMINE: 292 umol/L — AB (ref 0–285)

## 2015-12-16 ENCOUNTER — Encounter (HOSPITAL_BASED_OUTPATIENT_CLINIC_OR_DEPARTMENT_OTHER): Payer: Self-pay

## 2015-12-16 ENCOUNTER — Encounter: Payer: Self-pay | Admitting: Dietician

## 2015-12-16 ENCOUNTER — Ambulatory Visit (HOSPITAL_BASED_OUTPATIENT_CLINIC_OR_DEPARTMENT_OTHER): Admit: 2015-12-16 | Payer: BLUE CROSS/BLUE SHIELD | Admitting: Orthopedic Surgery

## 2015-12-16 SURGERY — RELEASE, A1 PULLEY, FOR TRIGGER FINGER
Anesthesia: Choice | Laterality: Right

## 2015-12-17 NOTE — Progress Notes (Signed)
Please let patient know that the kidney test is slightly better, overall glucose average is slightly high

## 2015-12-24 ENCOUNTER — Telehealth: Payer: Self-pay | Admitting: Family Medicine

## 2015-12-24 MED ORDER — CYCLOBENZAPRINE HCL 10 MG PO TABS: 10.0000 mg | ORAL_TABLET | Freq: Three times a day (TID) | ORAL | 1 refills | Status: DC | PRN

## 2015-12-24 NOTE — Telephone Encounter (Signed)
I dont see how it is possible for her to take it TID with only 3 tablets, also not safe to take daily flexeril TID  I have her only using as needed for her TMJ You can give at most 60 tablets  R 1

## 2015-12-24 NOTE — Telephone Encounter (Signed)
Call placed to patient in regards to refill request.   States that she does not take Tramadol.   Reports that she had questions about quantity of Flexeril. States that last refill she was given 90, but now was only given 30 and she is running out.   Advised that last refill was on 10/04/2015, #30 with 2 refills.   States that she uses medications TID and is running out.   MD please advise.

## 2015-12-24 NOTE — Telephone Encounter (Signed)
Patient is calling for refill on her tramadol [facility]  [phone]

## 2015-12-29 ENCOUNTER — Other Ambulatory Visit: Payer: Self-pay | Admitting: Family Medicine

## 2015-12-30 NOTE — Telephone Encounter (Signed)
Ok to refill hydrocodone??  Last office visit 10/04/2015.  Last refill 11/30/2015.

## 2015-12-31 MED ORDER — HYDROCODONE-ACETAMINOPHEN 10-325 MG PO TABS: 1.0000 | ORAL_TABLET | Freq: Four times a day (QID) | ORAL | 0 refills | Status: DC | PRN

## 2015-12-31 NOTE — Telephone Encounter (Signed)
okay

## 2015-12-31 NOTE — Telephone Encounter (Signed)
Prescription printed and patient made aware to come to office to pick up on 01/03/2016.

## 2016-01-10 ENCOUNTER — Telehealth: Payer: Self-pay | Admitting: Family Medicine

## 2016-01-10 NOTE — Telephone Encounter (Signed)
Patient called stating her ears are stopped up and she states that Dr. Buelah Manis stopped  her ibuprofen 800 mg. What would you recommend her taken for antiinflammatory.  CB# [phone]

## 2016-01-12 ENCOUNTER — Other Ambulatory Visit: Payer: Self-pay | Admitting: Family Medicine

## 2016-01-12 NOTE — Telephone Encounter (Signed)
She can take 600mg  BID for 3 days, then stop due to her kidneys

## 2016-01-12 NOTE — Telephone Encounter (Signed)
Call placed to patient and patient made aware.  

## 2016-01-12 NOTE — Telephone Encounter (Signed)
Call placed to patient.   Reports that she is having sinus pressure with HA, ear pressure, and head/ chest congestion.   States that she is taking Mucinex for congestion.   States that she was advised to not take IBU D/T kidney function. Reports that she has been taking Norco as needed without relief from the HA/ sinus pressure. States that when she is taking IBU she does feel more relief in ears and head. States that it feels like the IBU relieves swelling or inflammation.   MD please advise.

## 2016-02-01 ENCOUNTER — Other Ambulatory Visit: Payer: Self-pay | Admitting: Family Medicine

## 2016-02-02 ENCOUNTER — Other Ambulatory Visit: Payer: Self-pay | Admitting: Family Medicine

## 2016-02-02 MED ORDER — ATORVASTATIN CALCIUM 20 MG PO TABS: 20.0000 mg | ORAL_TABLET | Freq: Every day | ORAL | 0 refills | Status: DC

## 2016-02-02 MED ORDER — HYDROCODONE-ACETAMINOPHEN 10-325 MG PO TABS: 1.0000 | ORAL_TABLET | Freq: Four times a day (QID) | ORAL | 0 refills | Status: DC | PRN

## 2016-02-02 MED ORDER — LEVOTHYROXINE SODIUM 88 MCG PO TABS: 88.0000 ug | ORAL_TABLET | Freq: Every day | ORAL | 0 refills | Status: DC

## 2016-02-02 MED ORDER — METOPROLOL TARTRATE 25 MG PO TABS: 12.5000 mg | ORAL_TABLET | Freq: Two times a day (BID) | ORAL | 0 refills | Status: DC

## 2016-02-02 NOTE — Telephone Encounter (Signed)
Ok to refill Norco??  Last office visit 10/04/2015.  Last refill 12/31/2015.

## 2016-02-02 NOTE — Telephone Encounter (Signed)
Okay to refill? 

## 2016-02-08 ENCOUNTER — Ambulatory Visit (INDEPENDENT_AMBULATORY_CARE_PROVIDER_SITE_OTHER): Payer: BLUE CROSS/BLUE SHIELD | Admitting: Family Medicine

## 2016-02-08 ENCOUNTER — Encounter: Payer: Self-pay | Admitting: Family Medicine

## 2016-02-08 VITALS — BP 124/82 | HR 85 | Temp 97.9°F | Resp 16 | Ht 64.0 in | Wt 154.0 lb

## 2016-02-08 DIAGNOSIS — E782 Mixed hyperlipidemia: Secondary | ICD-10-CM

## 2016-02-08 DIAGNOSIS — E1122 Type 2 diabetes mellitus with diabetic chronic kidney disease: Secondary | ICD-10-CM

## 2016-02-08 DIAGNOSIS — Z23 Encounter for immunization: Secondary | ICD-10-CM

## 2016-02-08 DIAGNOSIS — F17200 Nicotine dependence, unspecified, uncomplicated: Secondary | ICD-10-CM

## 2016-02-08 DIAGNOSIS — G6289 Other specified polyneuropathies: Secondary | ICD-10-CM | POA: Diagnosis not present

## 2016-02-08 DIAGNOSIS — G894 Chronic pain syndrome: Secondary | ICD-10-CM | POA: Diagnosis not present

## 2016-02-08 DIAGNOSIS — N183 Chronic kidney disease, stage 3 unspecified: Secondary | ICD-10-CM

## 2016-02-08 DIAGNOSIS — J410 Simple chronic bronchitis: Secondary | ICD-10-CM

## 2016-02-08 DIAGNOSIS — R911 Solitary pulmonary nodule: Secondary | ICD-10-CM | POA: Diagnosis not present

## 2016-02-08 NOTE — Progress Notes (Signed)
   Subjective:    Patient ID: Kathryn Oconnell, female    DOB: [birth year], 50 y.o.   MRN: [id number]  Patient presents for Diabetes Patient for follow-up on chronic medical problems. She is followed by endocrinology for her diabetes mellitus this has been slowly improving. She is now on Victoza along with her insulin therapy.  She has chronic daily disease with worsening a few months back her creatinine spiked up to 1.7 now back to 1.4 on recent labs a few weeks ago. Seems to be established with nephrology with her ongoing diabetes mellitus.  Chronic pain with neuropathy, back pain she is still taking the hydrocodone  She continues to smoke. She had lung nodule noted on CT scan she is due for recheck this in 2 years  Review Of Systems:  GEN- denies fatigue, fever, weight loss,weakness, recent illness HEENT- denies eye drainage, change in vision, nasal discharge, CVS- denies chest pain, palpitations RESP- denies SOB, cough, wheeze ABD- denies N/V, change in stools, abd pain GU- denies dysuria, hematuria, dribbling, incontinence MSK- +oint pain, muscle aches, injury Neuro- denies headache, dizziness, syncope, seizure activity       Objective:    BP 124/82   Pulse 85   Temp 97.9 F (36.6 C) (Oral)   Resp 16   Ht 5\' 4"  (1.626 m)   Wt 154 lb (69.9 kg)   LMP 06/18/2013 Comment: irregular  SpO2 99%   BMI 26.43 kg/m  GEN- NAD, alert and oriented x3 HEENT- PERRL, EOMI, non injected sclera, pink conjunctiva, MMM, oropharynx clear Neck- Supple, no thyromegaly CVS- RRR, no murmur RESP-CTAB EXT- No edema Pulses- Radial, DP- 2+        Assessment & Plan:      Problem List Items Addressed This Visit    Peripheral neuropathy ([place])   NICOTINE ADDICTION   Lung nodule    Repeat CT of chest she does continue to smoke      Hyperlipidemia   Relevant Orders   Lipid panel (Completed)   Diabetes mellitus ([place]) - Primary    Her blood sugar has been improving significantly she will  continue follow-up with endocrinology      COPD (chronic obstructive pulmonary disease) ([facility])   CKD (chronic kidney disease) stage 3, GFR 30-59 ml/min    Referral to nephrology to establish care. Her creatinine is back at its baseline      Chronic pain syndrome    Pain medication refilled       Other Visit Diagnoses    Flu vaccine need       Relevant Orders   Flu Vaccine QUAD 36+ mos IM (Completed)      Note: This dictation was prepared with Dragon dictation along with smaller phrase technology. Any transcriptional errors that result from this process are unintentional.

## 2016-02-08 NOTE — Patient Instructions (Addendum)
Referral to nephrology [facility]  CT scan  chest to be done for lung nodules  Flu shot done  We will call with lab results  F/u 4 MONTHS

## 2016-02-09 LAB — LIPID PANEL
Cholesterol: 148 mg/dL (ref <200)
HDL: 41 mg/dL — ABNORMAL LOW (ref >50)
LDL CALC: 93 mg/dL
Total CHOL/HDL Ratio: 3.6 Ratio (ref <5.0)
Triglycerides: 68 mg/dL (ref <150)
VLDL: 14 mg/dL (ref <30)

## 2016-02-09 NOTE — Assessment & Plan Note (Signed)
Her blood sugar has been improving significantly she will continue follow-up with endocrinology

## 2016-02-09 NOTE — Assessment & Plan Note (Signed)
Pain medication refilled

## 2016-02-09 NOTE — Assessment & Plan Note (Signed)
Referral to nephrology to establish care. Her creatinine is back at its baseline

## 2016-02-09 NOTE — Assessment & Plan Note (Signed)
Repeat CT of chest she does continue to smoke

## 2016-02-14 ENCOUNTER — Ambulatory Visit: Payer: Self-pay | Admitting: Endocrinology

## 2016-02-15 ENCOUNTER — Telehealth: Payer: Self-pay | Admitting: Family Medicine

## 2016-02-15 NOTE — Telephone Encounter (Signed)
Patient would like to speak to you regarding some issues she is having and also about fmla  [phone]

## 2016-02-15 NOTE — Telephone Encounter (Signed)
Call placed to patient. LMTRC.  

## 2016-02-16 ENCOUNTER — Other Ambulatory Visit: Payer: Self-pay

## 2016-02-16 NOTE — Telephone Encounter (Signed)
Call placed to patient.   States that she is having a lot of difficulty with feeling overwhelmed at this time. States that her mother has recently been placed in SNF.   Recommended OV to discuss with MD.  Appointment scheduled.

## 2016-02-16 NOTE — Telephone Encounter (Signed)
noted 

## 2016-02-17 ENCOUNTER — Encounter: Payer: Self-pay | Admitting: Dietician

## 2016-02-17 ENCOUNTER — Encounter: Payer: Self-pay | Admitting: Endocrinology

## 2016-02-17 ENCOUNTER — Ambulatory Visit (INDEPENDENT_AMBULATORY_CARE_PROVIDER_SITE_OTHER): Payer: BLUE CROSS/BLUE SHIELD | Admitting: Endocrinology

## 2016-02-17 ENCOUNTER — Encounter: Payer: BLUE CROSS/BLUE SHIELD | Attending: Endocrinology | Admitting: Dietician

## 2016-02-17 VITALS — BP 128/82 | Ht 64.0 in | Wt 152.0 lb

## 2016-02-17 DIAGNOSIS — Z794 Long term (current) use of insulin: Secondary | ICD-10-CM | POA: Insufficient documentation

## 2016-02-17 DIAGNOSIS — E1165 Type 2 diabetes mellitus with hyperglycemia: Secondary | ICD-10-CM | POA: Insufficient documentation

## 2016-02-17 DIAGNOSIS — Z713 Dietary counseling and surveillance: Secondary | ICD-10-CM | POA: Diagnosis not present

## 2016-02-17 DIAGNOSIS — E1122 Type 2 diabetes mellitus with diabetic chronic kidney disease: Secondary | ICD-10-CM

## 2016-02-17 DIAGNOSIS — N183 Chronic kidney disease, stage 3 (moderate): Secondary | ICD-10-CM

## 2016-02-17 DIAGNOSIS — N289 Disorder of kidney and ureter, unspecified: Secondary | ICD-10-CM | POA: Diagnosis not present

## 2016-02-17 LAB — COMPREHENSIVE METABOLIC PANEL
ALK PHOS: 55 U/L (ref 39–117)
ALT: 18 U/L (ref 0–35)
AST: 24 U/L (ref 0–37)
Albumin: 3.9 g/dL (ref 3.5–5.2)
BILIRUBIN TOTAL: 0.3 mg/dL (ref 0.2–1.2)
BUN: 18 mg/dL (ref 6–23)
CO2: 25 meq/L (ref 19–32)
CREATININE: 1.46 mg/dL — AB (ref 0.40–1.20)
Calcium: 9.1 mg/dL (ref 8.4–10.5)
Chloride: 108 mEq/L (ref 96–112)
GFR: 48.6 mL/min — ABNORMAL LOW (ref >60.00)
GLUCOSE: 144 mg/dL — AB (ref 70–99)
Potassium: 4.2 mEq/L (ref 3.5–5.1)
Sodium: 138 mEq/L (ref 135–145)
TOTAL PROTEIN: 6.8 g/dL (ref 6.0–8.3)

## 2016-02-17 LAB — POCT GLYCOSYLATED HEMOGLOBIN (HGB A1C): Hemoglobin A1C: 6.8

## 2016-02-17 NOTE — Patient Instructions (Signed)
Breakfast, lunch, dinner daily. Plan ahead when possible. LUVO frozen meals may be a quick option. (whole grain, low fat, low sodium) Glucerna or Boost Glucose Control if you are unable to eat.  Possibly add fruit. Small amounts of protein with each meal and snack. How can you start to incorporate exercise into your schedule?  Aim for 30 minutes most days. Consider 2000 units vitamin D daily Consider stopping smoking.    Plan:  Aim for 2-3 Carb Choices per meal (30-45 grams) +/- 1 either way  Aim for 0-1 Carbs per snack if hungry  Include protein in moderation with your meals and snacks Consider reading food labels for Total Carbohydrate and Fat Grams of foods Consider checking BG at alternate times per day as directed by MD  Consider taking medication as directed by MD

## 2016-02-17 NOTE — Progress Notes (Signed)
Medical Nutrition Therapy:  Appt start time: 1400 end time:  1525.   Assessment:  Primary concerns today: Patient is here alone.  She would like to learn how to eat healthier.  She states her appetite has decreased on the Victoza.  She only eats 1 meal per day most days.  She has decreased her intake of chips and cookies since starting on Victoza. Hx includes type 2 diabetes since 2000, depression, vitamin D deficiency, hyperlipidemia, HTN, CKD, COPD and continues to smoke, and hypothyroid s/p Graves Disease.  Labs 02/08/16 include cholesterol 148, Triglycerides 68, HDL 41, LDL 93.  Her A1C was 7.6% 10/04/15 and decreased to 6.8% today and GFR of 50 12/14/15.  Weight today:  147 lbs.  States that she got down to 119 lbs 2 years ago but that was during a divorce then she increased to 160 lbs 11/2015.  She saw RD in [place] in June 2017.  Wt Readings from Last 3 Encounters:  02/17/16 152 lb (68.9 kg)  02/17/16 147 lb (66.7 kg)  02/08/16 154 lb (69.9 kg)   Patient lives with her 29 yo son.  Her ex husband is very involved and helpful.  She does the shopping and cooking.  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  Her mother is in a [place] and requires total care.  She is thinking about bringing her home and caring for her.  She cooks for her son but often does not eat it.  Her diet is insufficient in vitamins, minerals, and protein.  Preferred Learning Style:   No preference indicated   Learning Readiness:   Ready  MEDICATIONS: see list to include Lantus 24 units each am, Novolog 3-4 units with meals (but often only eats one meal per day), and Victoza.   DIETARY INTAKE:  Usual eating pattern includes 1-2 meals and 1 snacks per day. Everyday foods include jelly biscuit.   24-hr recall:  B ( AM): Jelly Biscuit and berry juice, water, coffee with sweet and low  Snk ( AM): none  L ( PM): skips Snk ( PM): occasional peanut butter and crackers D  ( PM): occasional steak, shrimp, rice and ginger sauce, soup, salad or SKIPS Snk ( PM): none Beverages: water, berry juice, coffee with sweet and low  Usual physical activity: none  (treadmill and bicycle at the house but has not used them)  Estimated energy needs: 1400calories 158 g carbohydrates 88 g protein 47 g fat  Progress Towards Goal(s):  In progress.   Nutritional Diagnosis:  NB-1.1 Food and nutrition-related knowledge deficit As related to balance of carbohydrate, protein, and fat.  As evidenced by diet hx.    Intervention:  Nutrition counseling/education related to meal planning and healthy eating for diabetes.  Discussed importance of regularly scheduled meals and caring for herself.  Discussed her A1C and goals. This has improved today but she is not eating well.  Discussed benefits of exercise daily.    Breakfast, lunch, dinner daily. Plan ahead when possible. LUVO frozen meals may be a quick option. (whole grain, low fat, low sodium) Glucerna or Boost Glucose Control if you are unable to eat.  Possibly add fruit. Small amounts of protein with each meal and snack. How can you start to incorporate exercise into your schedule?  Aim for 30 minutes most days. Consider 2000 units vitamin D daily Consider stopping smoking.    Plan:  Aim for 2-3 Carb Choices per meal (30-45  grams) +/- 1 either way  Aim for 0-1 Carbs per snack if hungry  Include protein in moderation with your meals and snacks Consider reading food labels for Total Carbohydrate and Fat Grams of foods Consider checking BG at alternate times per day as directed by MD  Consider taking medication as directed by MD  Teaching Method Utilized:  Visual Auditory Hands on  Handouts given during visit include:  List of counselors that take her insurance  Living with Diabetes  Meal plan card  My plate  Snack list  Label reading  Breakfast ideas  Barriers to learning/adherence to lifestyle change:  time/depression  Demonstrated degree of understanding via:  Teach Back   Monitoring/Evaluation:  Dietary intake, exercise, label reading, and body weight prn.

## 2016-02-17 NOTE — Progress Notes (Signed)
Patient ID: Kathryn Oconnell, female   DOB: 1965-07-09, 50 y.o.   MRN: [id number]           Reason for Appointment: Follow-up for Type 2 Diabetes  Referring physician: Dr. Buelah Manis   History of Present Illness:          Date of diagnosis of type 2 diabetes mellitus: 2000        Background history:   She had been on metformin for a few years but this was stopped in 1/16 when her renal function was impaired. She says she had been doing fairly well with this Trulicity 2.59 mg weekly In 10/16 she had significant hyperglycemia while taking oral hypoglycemic drugs and she was started on insulin She was switched back to oral agents with glipizide and Onglyza in 1/17 However subsequently blood sugars started going up and she was back on insulin in April 2017  Recent history:   INSULIN regimen is: LANTUS 24 in am, Novolog recently 3-4 U with large meals  Non-insulin hypoglycemic drugs the patient is taking are: Victoza 1.2 mg daily  A1c is now significantly better at 6.8, previously 7.6  Current management, blood sugar patterns and problems identified:  On her last visit he was told to cut back on Lantus 20 units but she is still taking 24 units  She is again checking her blood sugars somewhat erratically  Not clear if she has checked any blood sugars in the morning fasting, most of her readings are probably after breakfast   Again she appears to have a  tendency to low blood sugars periodically, once after breakfast and another time after supper  NOVOLOG: She is now and usually not taking this and was generally taken only with a big meal; even after [place] food last evening her blood sugar was only 154  She has lost significant amount of weight and she thinks this is from watching her diet better and probably skipping meals at times  Also continues to try and walk as much as possible lately  No side effects from Victoza which she is taking consistently    She has lost about 6 pounds  since her last visit       Side effects from medications have been: None  Compliance with the medical regimen: Fair HYPOGLYCEMIA: May feel shaky when symptomatic, does not always have symptoms with low sugar readings  Glucose monitoring:  done 1 times a day         Glucometer:  contour  Blood Glucose readings by time of day and averages from meter download:  Mean values apply above for all meters except median for One Touch  PRE-MEAL Fasting Lunch Dinner Bedtime Overall  Glucose range: ?  107   150  83-198    Mean/median:    142  120   POST-MEAL PC Breakfast PC Lunch PC Dinner  Glucose range: 57-98   46-222   Mean/median:       Self-care: The diet that the patient has been following is: Reducing fats. Meal times are:  Breakfast is at 10 AM, Lunch: Usually none Dinner: 7-8 PM   Typical meal intake: Breakfast is at 10 am, rarely eating fast food biscuits.  Evening meal is meat or chicken with potatoes, greens, macaroni.  Snacks on chips.  She will drink water and some juice, usually diet drinks                Dietician visit, most recent: 6/17  Exercise:  walking on treadmill about 1-2/7 days, about 30 minutes in the evening  Weight history: Previous range 119-196  Wt Readings from Last 3 Encounters:  02/17/16 152 lb (68.9 kg)  02/17/16 147 lb (66.7 kg)  02/08/16 154 lb (69.9 kg)    Glycemic control:   Lab Results  Component Value Date   HGBA1C 6.8 02/17/2016   HGBA1C 7.6 (H) 10/04/2015   HGBA1C 7.5 07/24/2015   Lab Results  Component Value Date   MICROALBUR 0.4 10/04/2015   LDLCALC 93 02/08/2016   CREATININE 1.41 (H) 12/14/2015   Lab Results  Component Value Date   MICRALBCREAT 13 10/04/2015    Other active problems discussed today: See review of systems     Medication List       Accurate as of 02/17/16  4:50 PM. Always use your most recent med list.          adapalene 0.1 % gel Commonly known as:  DIFFERIN 2 (two) times daily.     albuterol 108 (90 Base) MCG/ACT inhaler Commonly known as:  PROVENTIL HFA;VENTOLIN HFA Inhale 2 puffs into the lungs every 6 (six) hours as needed for wheezing or shortness of breath.   aspirin 81 MG tablet Take 81 mg by mouth daily.   atorvastatin 20 MG tablet Commonly known as:  LIPITOR Take 1 tablet (20 mg total) by mouth daily.   chlorhexidine 0.12 % solution Commonly known as:  PERIDEX Use as directed 15 mLs in the mouth or throat 2 (two) times daily.   CONTOUR NEXT EZ MONITOR w/Device Kit Use to monitor FSBS 2x daily. Dx: E11.9.   cyclobenzaprine 10 MG tablet Commonly known as:  FLEXERIL Take 1 tablet (10 mg total) by mouth 3 (three) times daily as needed.   fluticasone 50 MCG/ACT nasal spray Commonly known as:  FLONASE Place 2 sprays into the nose daily. For sinuses/allergies   glucose blood test strip Commonly known as:  BAYER CONTOUR NEXT TEST Use to monitor FSBS 2x daily. Dx: E11.9.   HYDROcodone-acetaminophen 10-325 MG tablet Commonly known as:  NORCO Take 1 tablet by mouth every 6 (six) hours as needed.   hydrOXYzine 25 MG tablet Commonly known as:  ATARAX/VISTARIL Take 1 tablet (25 mg total) by mouth 3 (three) times daily as needed.   insulin aspart 100 UNIT/ML FlexPen Commonly known as:  NOVOLOG FLEXPEN Inject 5 to 60 units twice daily with meals as needed per sliding scale.   LANTUS SOLOSTAR [place] Inject into the skin. 24 units daily   Insulin Glargine 100 UNIT/ML Solostar Pen Commonly known as:  LANTUS SOLOSTAR INJECT 10-50 UNITS SUBCUTANEOUSLY ONCE DAILY  OR AS DIRECTED   levothyroxine 88 MCG tablet Commonly known as:  SYNTHROID, LEVOTHROID Take 1 tablet (88 mcg total) by mouth daily.   LORazepam 0.5 MG tablet Commonly known as:  ATIVAN 1 tab every 8 hours as needed   medroxyPROGESTERone 2.5 MG tablet Commonly known as:  PROVERA Take 1 tablet (2.5 mg total) by mouth daily.   metoprolol tartrate 25 MG tablet Commonly known as:   LOPRESSOR Take 0.5 tablets (12.5 mg total) by mouth 2 (two) times daily.   ONE TOUCH LANCETS Misc Use to monitor FSBS 1x daily. Dx: E11.9.   RELION PEN NEEDLES 31G X 6 MM Misc Generic drug:  Insulin Pen Needle USE AS DIRECTED TO  ADMINISTER  INSULIN   valACYclovir 1000 MG tablet Commonly known as:  VALTREX Take 1 tablet (1,000 mg total) by mouth daily.  VICTOZA 18 MG/3ML Sopn Generic drug:  liraglutide Inject 0.2 mLs (1.2 mg total) into the skin daily. Inject once daily at the same time   zolpidem 12.5 MG CR tablet Commonly known as:  AMBIEN CR Take 1 tablet (12.5 mg total) by mouth at bedtime as needed for sleep.       Allergies:  Allergies  Allergen Reactions  . Erythromycin   . Imitrex [Sumatriptan]   . Sulfa Antibiotics Other (See Comments)    Causes stomach cramps  . Topamax [Topiramate]     Nausea and worsening    Past Medical History:  Diagnosis Date  . Anxiety   . BV (bacterial vaginosis) 06/03/2013  . Chronic kidney disease   . COPD (chronic obstructive pulmonary disease) ([place])   . Depression   . Diabetes mellitus without complication ([place])   . Eczema   . Graves disease   . HSV-2 infection   . Hyperlipidemia   . Hypertension   . Hypothyroid   . Labial lesion 06/25/2015  . LLQ pain 02/18/2014  . Lung nodule   . LUQ pain 02/18/2014  . Menopausal vaginal dryness 02/24/2014  . Migraines   . Neuropathy ([place])   . Tobacco use   . Trouble in sleeping 06/25/2015  . Unspecified symptom associated with female genital organs 06/03/2013  . Vaginal discharge 06/03/2013  . Weight loss 02/18/2014  . Yeast infection 06/17/2013    Past Surgical History:  Procedure Laterality Date  . CESAREAN SECTION    . FOOT SURGERY     5th digit left foot / Great toe, 2ND TOE  . THYROID SURGERY      Family History  Problem Relation Age of Onset  . Stroke Mother     x 3  . Heart disease Mother   . Cancer Mother     breast   . Thyroid disease Mother   . Hyperlipidemia  Mother   . Heart disease Father   . Cancer Father     stomach cancer   . Hypertension Father   . Heart attack Father   . Gout Brother   . Migraines Son   . Hypertension Paternal Uncle   . Heart disease Maternal Grandmother   . Heart attack Maternal Grandmother   . Heart disease Maternal Grandfather   . Heart attack Maternal Grandfather   . Cancer Paternal Grandmother   . Diabetes Paternal Grandfather   . Asthma Daughter     Social History:  reports that she has been smoking Cigarettes.  She has a 17.50 pack-year smoking history. She has never used smokeless tobacco. She reports that she does not drink alcohol or use drugs.   Review of Systems  RENAL function abnormalities: She has had slightly high creatinine and is waiting for a referral for the nephrologist.Last creatinine 1.4, has not had evaluation by PCP recently  Lipid history: Currently on Lipitor 20 mg with adequate control    Lab Results  Component Value Date   CHOL 148 02/08/2016   HDL 41 (L) 02/08/2016   LDLCALC 93 02/08/2016   LDLDIRECT 95 10/04/2015   TRIG 68 02/08/2016   CHOLHDL 3.6 02/08/2016           Hypertension:mild, on metoprolol alone  Most recent eye exam was In 1/17  Most recent foot exam: 8/17  HYPOTHYROIDISM: She has had fairly stable thyroid levels with 88 g levothyroxine  Lab Results  Component Value Date   TSH 2.71 11/17/2015   TSH 2.795 03/24/2015   TSH  2.053 07/28/2014   FREET4 0.88 11/17/2015   FREET4 1.17 03/24/2015   FREET4 1.07 07/28/2014    LABS:  Office Visit on 02/17/2016  Component Date Value Ref Range Status  . Hemoglobin A1C 02/17/2016 6.8   Final    Physical Examination:  BP 128/82   Ht _0  (1.626 m)   Wt 152 lb (68.9 kg)   LMP 06/18/2013 Comment: irregular  BMI 26.09 kg/m    ASSESSMENT:  Diabetes type 2, BMI 27  See history of present illness for detailed discussion of current diabetes management, blood sugar patterns and problems  identified  A1c is now below 7 at 6.8  Blood sugars are generally well controlled She appears to be getting fairly good postprandial control with Victoza and not usually using Novolog However has not checked enough readings after meals to know whether she needs NovoLog consistently or not Also on occasion has had low sugars with trying NovoLog and not eating as much FASTING blood sugars have not been monitored but may be low normal especially since after breakfast her readings are generally low normal also without NovoLog usually Improved A1c is likely indicates good control overall However does need to avoid hypoglycemia as above  RENAL dysfunction: Last creatinine 1.4 and will recheck it again, waiting for nephrology consultation.  Hypertension: Well controlled, no history of microalbuminuria  PLAN:    Will reduce her LANTUS insulin down to 20 units  Check post prandial readings more to decide on need for NovoLog and how much she needs for various kind of meals  Generally needs to check more often and discussed timing and targets of glucose monitoring. Consistent exercise  Patient Instructions  Lantus 20 units  Keep am sugars 90-120 range  More testing     Counseling time on subjects discussed above is over 50% of today's 25 minute visit  Dorin Stooksbury 02/17/2016, 4:50 PM   Note: This office note was prepared with Estate agent. Any transcriptional errors that result from this process are unintentional.  ADDENDUM: Creatinine is about the same of 1.46 will forward to PCP

## 2016-02-17 NOTE — Patient Instructions (Addendum)
Lantus 20 units  Keep am sugars 90-120 range  More testing

## 2016-02-18 ENCOUNTER — Ambulatory Visit: Payer: Self-pay | Admitting: Family Medicine

## 2016-02-18 NOTE — Progress Notes (Signed)
Please let patient know that the kidney test is about the same as before, have forwarded to PCP

## 2016-02-23 ENCOUNTER — Other Ambulatory Visit: Payer: Self-pay

## 2016-02-28 ENCOUNTER — Inpatient Hospital Stay: Admission: RE | Admit: 2016-02-28 | Payer: Self-pay | Source: Ambulatory Visit

## 2016-02-28 ENCOUNTER — Telehealth: Payer: Self-pay | Admitting: *Deleted

## 2016-02-28 NOTE — Telephone Encounter (Signed)
Received call from [place] at [place].   Reports that patient "No Shoed" for appointment today for CT.   MD to be made aware.

## 2016-03-01 ENCOUNTER — Encounter: Payer: Self-pay | Admitting: Family Medicine

## 2016-03-01 ENCOUNTER — Other Ambulatory Visit: Payer: Self-pay | Admitting: Family Medicine

## 2016-03-02 MED ORDER — HYDROCODONE-ACETAMINOPHEN 10-325 MG PO TABS: 1.0000 | ORAL_TABLET | Freq: Four times a day (QID) | ORAL | 0 refills | Status: DC | PRN

## 2016-03-07 ENCOUNTER — Telehealth: Payer: Self-pay | Admitting: Family Medicine

## 2016-03-07 NOTE — Telephone Encounter (Signed)
Patient would like to speak with you regarding her heart rate and blood pressure being up  Please call her at [phone]

## 2016-03-08 ENCOUNTER — Other Ambulatory Visit: Payer: Self-pay | Admitting: Endocrinology

## 2016-03-08 NOTE — Telephone Encounter (Signed)
Call placed to patient and patient made aware.   Appointment re-scheduled for Friday.

## 2016-03-08 NOTE — Telephone Encounter (Signed)
She needs OV, So keep appt on Monday, if she could come in Friday would be better Keep taking the metoprolol 1.5 ( 37.5mg )  tablets twice a day  If she has chest pain, palpitations go to ER

## 2016-03-08 NOTE — Telephone Encounter (Signed)
Call placed to patient.   States that she has noted her BP and HR have been elevated for some time.   She has increased her own Metoprolol to 25mg  PO BID, and her readings remain elevated.   BP runs around 160/90. HR ranges between 80-110.  States that she has appt scheduled for Monday, 03/13/2016.  MD please advise.

## 2016-03-10 ENCOUNTER — Ambulatory Visit (INDEPENDENT_AMBULATORY_CARE_PROVIDER_SITE_OTHER): Payer: BLUE CROSS/BLUE SHIELD | Admitting: Family Medicine

## 2016-03-10 ENCOUNTER — Ambulatory Visit: Payer: BLUE CROSS/BLUE SHIELD | Admitting: Family Medicine

## 2016-03-10 ENCOUNTER — Encounter: Payer: Self-pay | Admitting: Family Medicine

## 2016-03-10 VITALS — BP 164/94 | HR 64 | Temp 97.7°F | Resp 14 | Ht 64.0 in | Wt 150.0 lb

## 2016-03-10 DIAGNOSIS — F5101 Primary insomnia: Secondary | ICD-10-CM | POA: Diagnosis not present

## 2016-03-10 DIAGNOSIS — N183 Chronic kidney disease, stage 3 unspecified: Secondary | ICD-10-CM

## 2016-03-10 DIAGNOSIS — G4452 New daily persistent headache (NDPH): Secondary | ICD-10-CM | POA: Diagnosis not present

## 2016-03-10 DIAGNOSIS — I10 Essential (primary) hypertension: Secondary | ICD-10-CM | POA: Diagnosis not present

## 2016-03-10 MED ORDER — KETOROLAC TROMETHAMINE 60 MG/2ML IM SOLN
60.0000 mg | Freq: Once | INTRAMUSCULAR | Status: AC
  Administered 2016-03-10: 60 mg via INTRAMUSCULAR

## 2016-03-10 NOTE — Progress Notes (Signed)
Subjective:    Patient ID: Kathryn Oconnell, female    DOB: 10-08-1965, 50 y.o.   MRN: [id number]  Patient presents for HTN (BP elevated)  Issue here with hypertension she has underlying hypertension in setting of diabetes mellitus chronic kidney disease stage III. Her blood pressures been well controlled this entire year however couple weeks ago her blood pressure started to spike up. She admits that her mother has been sick with pneumonia and there's been some increased stress at work but she has not been able to get her blood pressure back down. Her blood pressure maxed out at 172/96 she's been checking daily he ranges from 120 to 160s over 60s to 101 her heart rate also jumps up. I advised her to increase her metoprolol to the quinolone of 37.5 mg twice a day which she did yesterday evening. She did go to the urgent care because she was having headaches as well they gave her amlodipine 5 mg  She denies a change in her vision. She has been taking extra hydrocodone but this has not helped with her headache Her thyroid has been controlled, diabetes has been much improved  She does admit to not sleeping well    Review Of Systems:  GEN- denies fatigue, fever, weight loss,weakness, recent illness HEENT- denies eye drainage, change in vision, nasal discharge, CVS- denies chest pain, palpitations RESP- denies SOB, cough, wheeze ABD- denies N/V, change in stools, abd pain GU- denies dysuria, hematuria, dribbling, incontinence MSK- denies joint pain, muscle aches, injury Neuro- + headache,  Denies dizziness, syncope, seizure activity       Objective:    BP (!) 164/94 (BP Location: Right Arm, Patient Position: Sitting, Cuff Size: Normal)   Pulse 64   Temp 97.7 F (36.5 C) (Oral)   Resp 14   Ht 5\' 4"  (1.626 m)   Wt 150 lb (68 kg)   LMP 06/18/2013 Comment: irregular  SpO2 98%   BMI 25.75 kg/m  GEN- NAD, alert and oriented x3 HEENT- PERRL, EOMI, non injected sclera, pink conjunctiva,  MMM, oropharynx clear Neck- Supple, no thyromegaly CVS- RRR, no murmur RESP-CTAB Neuro-CNII-XII in tact, no focal deficits  EXT- No edema Pulses- Radial,  2+  EKG- NSR, atrial enlargement, flat t waves, unchanged from previous       Assessment & Plan:      Problem List Items Addressed This Visit    Insomnia    Has some underlying anxiety in setting insomia, she has been taking more ativan, recommend that we not increase this due to her high dose of narcotics throughout the month already       Essential hypertension, benign - Primary    BP elevating unclear cause out of no where, EKG unchanged, check renal function, ensure near her baseline of 1.4, check lytes. Recent thyroid studies have been normal Some change in stress level may be contributing as well but with headache. Hold the Norvasc Increase Toprol to 50mg  BID, given toradol IM today If headaches does not improve then will plan for imaging of brain- r/o anuesym or CVA  Her son has appt Monday so will f/u with her then, if she cancels by Tuesday scan send me results of BP      Relevant Orders   EKG 12-Lead (Completed)   Basic metabolic panel (Completed)   TSH (Completed)   CKD (chronic kidney disease) stage 3, GFR 30-59 ml/min    Other Visit Diagnoses    New daily persistent headache  Relevant Medications   ketorolac (TORADOL) injection 60 mg (Completed)      Note: This dictation was prepared with Dragon dictation along with smaller phrase technology. Any transcriptional errors that result from this process are unintentional.

## 2016-03-10 NOTE — Patient Instructions (Addendum)
Increase toprol to 50mg  Twice a day   ( Take 2 of the 25mg  )  tablets twice a day   Hold Norvasc for now  Send me BP readings on Tuesday  F/U pending results

## 2016-03-11 LAB — BASIC METABOLIC PANEL
BUN: 18 mg/dL (ref 7–25)
CHLORIDE: 105 mmol/L (ref 98–110)
CO2: 24 mmol/L (ref 20–31)
CREATININE: 1.18 mg/dL — AB (ref 0.50–1.05)
Calcium: 9.1 mg/dL (ref 8.6–10.4)
GLUCOSE: 49 mg/dL — AB (ref 70–99)
Potassium: 4.4 mmol/L (ref 3.5–5.3)
Sodium: 137 mmol/L (ref 135–146)

## 2016-03-11 LAB — TSH: TSH: 7.75 mIU/L — ABNORMAL HIGH

## 2016-03-12 ENCOUNTER — Encounter: Payer: Self-pay | Admitting: Family Medicine

## 2016-03-12 NOTE — Assessment & Plan Note (Signed)
Has some underlying anxiety in setting insomia, she has been taking more ativan, recommend that we not increase this due to her high dose of narcotics throughout the month already

## 2016-03-12 NOTE — Assessment & Plan Note (Signed)
BP elevating unclear cause out of no where, EKG unchanged, check renal function, ensure near her baseline of 1.4, check lytes. Recent thyroid studies have been normal Some change in stress level may be contributing as well but with headache. Hold the Norvasc Increase Toprol to 50mg  BID, given toradol IM today If headaches does not improve then will plan for imaging of brain- r/o anuesym or CVA  Her son has appt Monday so will f/u with her then, if she cancels by Tuesday scan send me results of BP

## 2016-03-13 ENCOUNTER — Encounter: Payer: Self-pay | Admitting: Family Medicine

## 2016-03-13 ENCOUNTER — Ambulatory Visit: Payer: BLUE CROSS/BLUE SHIELD | Admitting: Family Medicine

## 2016-03-13 ENCOUNTER — Other Ambulatory Visit: Payer: Self-pay | Admitting: Family Medicine

## 2016-03-13 ENCOUNTER — Other Ambulatory Visit: Payer: Self-pay | Admitting: *Deleted

## 2016-03-13 DIAGNOSIS — E039 Hypothyroidism, unspecified: Secondary | ICD-10-CM

## 2016-03-13 MED ORDER — LEVOTHYROXINE SODIUM 100 MCG PO TABS: 100.0000 ug | ORAL_TABLET | Freq: Every day | ORAL | 3 refills | Status: DC

## 2016-03-14 ENCOUNTER — Encounter: Payer: Self-pay | Admitting: Family Medicine

## 2016-03-14 MED ORDER — LORAZEPAM 0.5 MG PO TABS: ORAL_TABLET | ORAL | 2 refills | Status: DC

## 2016-03-14 NOTE — Telephone Encounter (Signed)
Ok to refill Ambien??  Last office visit 03/10/2016.  Last refill 10/04/2015, #2 refills.

## 2016-03-14 NOTE — Telephone Encounter (Signed)
Okay to refill Lorrin Mais, ativan

## 2016-03-14 NOTE — Telephone Encounter (Signed)
Medication called to pharmacy. 

## 2016-03-18 ENCOUNTER — Encounter: Payer: Self-pay | Admitting: Family Medicine

## 2016-03-20 ENCOUNTER — Telehealth: Payer: Self-pay | Admitting: Family Medicine

## 2016-03-20 ENCOUNTER — Other Ambulatory Visit: Payer: Self-pay | Admitting: Family Medicine

## 2016-03-20 ENCOUNTER — Encounter: Payer: Self-pay | Admitting: Endocrinology

## 2016-03-20 ENCOUNTER — Telehealth: Payer: Self-pay | Admitting: Endocrinology

## 2016-03-20 NOTE — Telephone Encounter (Signed)
TSH is 7.75 and BP is up to 150/99 tested at Dr. Janeann Forehand office

## 2016-03-20 NOTE — Telephone Encounter (Signed)
Patient calling to say her bp is still running high and she is having bad headaches, would like to know what she can do or if dr Buelah Manis can call in different bp med  [phone]

## 2016-03-20 NOTE — Telephone Encounter (Signed)
Call placed to patient.   Patient is adamant about changing BP medication since she feels it is no worksing.   MD made aware and advised OV to discuss.   Appointment scheduled.

## 2016-03-20 NOTE — Telephone Encounter (Signed)
Call placed to patient to inquire.   Phone call disconnected due to bad service.   Will attempt to call later.

## 2016-03-20 NOTE — Telephone Encounter (Signed)
I dont recommend abruptly stopping the beta blocker, this may cause her to have palpitations. Her thyroid levels have been off, so she should have just started this She can resume the norvasc.  If she is feeling very anxious this is going to make her blood pressure go up. I think her blood pressure will start to come down. She can always drop by as a nurse visit and not pay a copay and we can check her blood pressure

## 2016-03-20 NOTE — Telephone Encounter (Signed)
Received return call from patient. Discussed MD recommendations in MyChart message.   Patient is concerned about continuing Metoprolol since it is ineffective at this time. States that she does not want to keep taking Metoprolol since it seems it is no longer working for her.   Patient has high anxiety since she feels her mother's BP contributed to her early stroke.  MD please advise.

## 2016-03-21 ENCOUNTER — Encounter: Payer: Self-pay | Admitting: Family Medicine

## 2016-03-21 ENCOUNTER — Ambulatory Visit (INDEPENDENT_AMBULATORY_CARE_PROVIDER_SITE_OTHER): Payer: BLUE CROSS/BLUE SHIELD | Admitting: Family Medicine

## 2016-03-21 ENCOUNTER — Ambulatory Visit (HOSPITAL_COMMUNITY)
Admission: RE | Admit: 2016-03-21 | Discharge: 2016-03-21 | Disposition: A | Discharge status: Home / Self Care | Payer: BLUE CROSS/BLUE SHIELD | Source: Ambulatory Visit | Attending: Family Medicine | Admitting: Family Medicine

## 2016-03-21 VITALS — BP 148/68 | HR 82 | Temp 98.1°F | Resp 14 | Ht 64.0 in | Wt 145.0 lb

## 2016-03-21 DIAGNOSIS — I10 Essential (primary) hypertension: Secondary | ICD-10-CM

## 2016-03-21 DIAGNOSIS — I739 Peripheral vascular disease, unspecified: Secondary | ICD-10-CM | POA: Diagnosis not present

## 2016-03-21 DIAGNOSIS — E039 Hypothyroidism, unspecified: Secondary | ICD-10-CM

## 2016-03-21 DIAGNOSIS — R51 Headache: Secondary | ICD-10-CM | POA: Diagnosis not present

## 2016-03-21 DIAGNOSIS — R519 Headache, unspecified: Secondary | ICD-10-CM

## 2016-03-21 MED ORDER — KETOROLAC TROMETHAMINE 60 MG/2ML IM SOLN
60.0000 mg | Freq: Once | INTRAMUSCULAR | Status: AC
  Administered 2016-03-21: 60 mg via INTRAMUSCULAR

## 2016-03-21 NOTE — Assessment & Plan Note (Signed)
Abnormal TFT recently, continue synthroid at 166mcg rechecking levels in 6 weeks

## 2016-03-21 NOTE — Telephone Encounter (Signed)
okay

## 2016-03-21 NOTE — Progress Notes (Signed)
Subjective:    Patient ID: Kathryn Oconnell, female    DOB: 1965/04/08, 50 y.o.   MRN: [id number]  Patient presents for Medication Management (HTN/ palpitations/ HA)  Pt here to follow-up for blood pressure. She was seen December 8 at that time she had been having elevated blood pressures for a couple weeks. She did admit to some increased stress and difficulty sleeping. I increased her metoprolol to 37.5 mg before the actual visit. Her blood pressures were still running the 150s to 170 over 90s. She actually went to the urgent care on December 7 he gave her amlodipine 5 mg. At her visit I increased her to metoprolol 50 mg twice a day she's been having some headache but declined any palpitations or chest pain at that time. She is called back and stated her blood pressure is still high and she no longer wants to take the metoprolol. I recommended that she not sick stop the beta blocker because she has history of palpitations/PVC dating back to 2009 when she was seen by cardiology. She is not on hydrochlorothiazide  Had some she was on Benicar in past but had hypotensive episodes with this, therefore taken down to just metoprolol in May/June of 2016. Note her labs also shows her thyroid levels were off and sythroid was recently increased to 163mcg which may be also contributing to her BP fluctations  This past weekend had increased palpitations, She admits that she had to file for chapter 13 bankruptcy and had to go to court yesterday which increased some stress. She is also in the process of filing divorce from her husband. Also her mother has not been doing well in the nursing home. She's also having difficulties with her brother whom she is having to help support her financially as well. She is having a lot of difficulty and the job and is contemplating quitting her job and taking her mother from the nursing home to take care of her. She still however having headaches daily which is concerning to  her.   Review Of Systems:  GEN- denies fatigue, fever, weight loss,weakness, recent illness HEENT- denies eye drainage, change in vision, nasal discharge, CVS- denies chest pain, +palpitations RESP- denies SOB, cough, wheeze ABD- denies N/V, change in stools, abd pain GU- denies dysuria, hematuria, dribbling, incontinence MSK- denies joint pain, muscle aches, injury Neuro-+ headache, dizziness, syncope, seizure activity       Objective:    BP (!) 148/68 (BP Location: Left Arm, Patient Position: Sitting, Cuff Size: Normal)   Pulse 82   Temp 98.1 F (36.7 C) (Oral)   Resp 14   Ht 5\' 4"  (1.626 m)   Wt 145 lb (65.8 kg)   LMP 06/18/2013 Comment: irregular  SpO2 99%   BMI 24.89 kg/m  GEN- NAD, alert and oriented x3 HEENT- PERRL, EOMI, non injected sclera, pink conjunctiva, MMM, oropharynx clear CVS- RRR, no murmur RESP-CTAB Psych- stressed appearing, not anxious , normal speech  EXT- No edema Neuro-CNII-XII in tact, no focal deficits  Pulses- Radial  2+  EKG- NSR, flat t waves  Unchanged from previous 2 ekg        Assessment & Plan:      Problem List Items Addressed This Visit    Hypothyroidism - Primary    Abnormal TFT recently, continue synthroid at 125mcg rechecking levels in 6 weeks      Essential hypertension, benign    Blood pressure is still uncontrolled think this is significantly multifactorial. I was  not aware of all the stressors that have been going on in her life especially recently with her court apperances. I think all of this has  Led to the increase in her  blood pressure along with the hypothyroidism issue. She is quite reluctant to stay on the metoprolol as she is convinced that it is not working. I've given her bysystolic 5 mg she will take this in addition to the amlodipine. She will monitor her blood pressure at home. She deathly needs to be on some type of rate blocking medication as she has history of PVCs and palpitations. I did obtain a CT scan  of her head as she is high-risk for stroke based on her other comorbidities this was negative with regards to the headache. She was given another injection of Toradol which did help her headache the last time.  If she can take  figure out her financial issues think this will also help.      Relevant Medications   amLODipine (NORVASC) 10 MG tablet   Other Relevant Orders   EKG 12-Lead (Completed)   CT Head Wo Contrast (Completed)    Other Visit Diagnoses    Acute nonintractable headache, unspecified headache type       Relevant Medications   amLODipine (NORVASC) 10 MG tablet   ketorolac (TORADOL) injection 60 mg (Completed)   Other Relevant Orders   CT Head Wo Contrast (Completed)      Note: This dictation was prepared with Dragon dictation along with smaller phrase technology. Any transcriptional errors that result from this process are unintentional.

## 2016-03-21 NOTE — Patient Instructions (Addendum)
Bystolic once a day  Stop the metoprolol Continue the norvasc- you can take all 10mg  at same time  CT of head to be done  Message me on Friday with blood pressure readings  Give note for work for today, can return tomorrow

## 2016-03-21 NOTE — Assessment & Plan Note (Addendum)
Blood pressure is still uncontrolled think this is significantly multifactorial. I was not aware of all the stressors that have been going on in her life especially recently with her court apperances. I think all of this has  Led to the increase in her  blood pressure along with the hypothyroidism issue. She is quite reluctant to stay on the metoprolol as she is convinced that it is not working. I've given her bysystolic 5 mg she will take this in addition to the amlodipine. She will monitor her blood pressure at home. She deathly needs to be on some type of rate blocking medication as she has history of PVCs and palpitations. I did obtain a CT scan of her head as she is high-risk for stroke based on her other comorbidities this was negative with regards to the headache. She was given another injection of Toradol which did help her headache the last time.  If she can take  figure out her financial issues think this will also help.

## 2016-03-21 NOTE — Telephone Encounter (Signed)
Ok to refill 

## 2016-03-22 NOTE — Telephone Encounter (Signed)
Noted  

## 2016-03-29 ENCOUNTER — Other Ambulatory Visit: Payer: Self-pay | Admitting: Physician Assistant

## 2016-03-29 ENCOUNTER — Telehealth: Payer: Self-pay | Admitting: Family Medicine

## 2016-03-29 NOTE — Telephone Encounter (Signed)
Patient calling to speak to you regarding her blood pressure readings and also get rx for her hydrocodone  [phone]

## 2016-03-30 MED ORDER — HYDROCODONE-ACETAMINOPHEN 10-325 MG PO TABS: 1.0000 | ORAL_TABLET | Freq: Four times a day (QID) | ORAL | 0 refills | Status: DC | PRN

## 2016-03-30 NOTE — Telephone Encounter (Signed)
Called patient and informed Rx ready for p/u at front desk.  Patient verbalized understanding.

## 2016-03-30 NOTE — Telephone Encounter (Signed)
Ok with refill 

## 2016-03-30 NOTE — Telephone Encounter (Signed)
Spoke to patient. Patient left B/P readings on vmail. Advised would check and document. Requesting pain med, advised MD out of office.  Patient request Dr. Dennard Schaumann sign off b/c need med before weekend.  Advised would send to Dr. Dennard Schaumann for review.

## 2016-04-24 ENCOUNTER — Encounter: Payer: Self-pay | Admitting: Family Medicine

## 2016-04-25 MED ORDER — AMLODIPINE BESYLATE 10 MG PO TABS: 10.0000 mg | ORAL_TABLET | Freq: Every day | ORAL | 1 refills | Status: DC

## 2016-04-30 ENCOUNTER — Other Ambulatory Visit: Payer: Self-pay | Admitting: Family Medicine

## 2016-05-01 ENCOUNTER — Other Ambulatory Visit: Payer: Self-pay | Admitting: Family Medicine

## 2016-05-01 ENCOUNTER — Encounter: Payer: Self-pay | Admitting: Family Medicine

## 2016-05-01 MED ORDER — HYDROCODONE-ACETAMINOPHEN 10-325 MG PO TABS: 1.0000 | ORAL_TABLET | Freq: Four times a day (QID) | ORAL | 0 refills | Status: DC | PRN

## 2016-05-10 ENCOUNTER — Other Ambulatory Visit: Payer: Self-pay | Admitting: *Deleted

## 2016-05-10 ENCOUNTER — Other Ambulatory Visit: Payer: Self-pay | Admitting: Family Medicine

## 2016-05-10 DIAGNOSIS — N183 Chronic kidney disease, stage 3 unspecified: Secondary | ICD-10-CM

## 2016-05-10 DIAGNOSIS — I10 Essential (primary) hypertension: Secondary | ICD-10-CM

## 2016-05-11 LAB — CBC WITH DIFFERENTIAL/PLATELET
BASOS ABS: 0 {cells}/uL (ref 0–200)
Basophils Relative: 0 %
EOS ABS: 90 {cells}/uL (ref 15–500)
Eosinophils Relative: 1 %
HCT: 39.3 % (ref 35.0–45.0)
Hemoglobin: 13.2 g/dL (ref 12.0–15.0)
LYMPHS PCT: 38 %
Lymphs Abs: 3420 cells/uL (ref 850–3900)
MCH: 32.7 pg (ref 27.0–33.0)
MCHC: 33.6 g/dL (ref 32.0–36.0)
MCV: 97.3 fL (ref 80.0–100.0)
MONOS PCT: 8 %
MPV: 9.4 fL (ref 7.5–12.5)
Monocytes Absolute: 720 cells/uL (ref 200–950)
NEUTROS ABS: 4770 {cells}/uL (ref 1500–7800)
Neutrophils Relative %: 53 %
PLATELETS: 257 10*3/uL (ref 140–400)
RBC: 4.04 MIL/uL (ref 3.80–5.10)
RDW: 14.3 % (ref 11.0–15.0)
WBC: 9 10*3/uL (ref 3.8–10.8)

## 2016-05-11 LAB — COMPREHENSIVE METABOLIC PANEL
ALT: 14 U/L (ref 6–29)
AST: 23 U/L (ref 10–35)
Albumin: 4 g/dL (ref 3.6–5.1)
Alkaline Phosphatase: 58 U/L (ref 33–130)
BUN: 16 mg/dL (ref 7–25)
CO2: 22 mmol/L (ref 20–31)
CREATININE: 1.46 mg/dL — AB (ref 0.50–1.05)
Calcium: 9 mg/dL (ref 8.6–10.4)
Chloride: 109 mmol/L (ref 98–110)
Glucose, Bld: 92 mg/dL (ref 70–99)
Potassium: 4.2 mmol/L (ref 3.5–5.3)
SODIUM: 139 mmol/L (ref 135–146)
Total Bilirubin: 0.4 mg/dL (ref 0.2–1.2)
Total Protein: 6.9 g/dL (ref 6.1–8.1)

## 2016-05-11 LAB — TSH: TSH: 1.16 mIU/L

## 2016-05-15 ENCOUNTER — Ambulatory Visit (INDEPENDENT_AMBULATORY_CARE_PROVIDER_SITE_OTHER): Payer: BLUE CROSS/BLUE SHIELD | Admitting: Family Medicine

## 2016-05-15 ENCOUNTER — Encounter: Payer: Self-pay | Admitting: Family Medicine

## 2016-05-15 VITALS — BP 118/64 | HR 96 | Temp 98.4°F | Resp 14 | Ht 64.0 in | Wt 152.0 lb

## 2016-05-15 DIAGNOSIS — E039 Hypothyroidism, unspecified: Secondary | ICD-10-CM | POA: Diagnosis not present

## 2016-05-15 DIAGNOSIS — N183 Chronic kidney disease, stage 3 unspecified: Secondary | ICD-10-CM

## 2016-05-15 DIAGNOSIS — F17200 Nicotine dependence, unspecified, uncomplicated: Secondary | ICD-10-CM | POA: Diagnosis not present

## 2016-05-15 DIAGNOSIS — I10 Essential (primary) hypertension: Secondary | ICD-10-CM | POA: Diagnosis not present

## 2016-05-15 NOTE — Assessment & Plan Note (Signed)
TFT at goal, no changes 

## 2016-05-15 NOTE — Assessment & Plan Note (Signed)
counsled on cessation, she is contemplating quitting

## 2016-05-15 NOTE — Progress Notes (Signed)
   Subjective:    Patient ID: Kathryn Oconnell, female    DOB: [birth year], 51 y.o.   MRN: [id number]  Patient presents for Follow-up (HTN) Patient here to follow-up hypertension. She's not been taking her blood pressure medications regularly. At her last visit she was given Bystolic 5 mg in addition to her amlodipine as she does have history of PVCs and palpitations.She actually went back on the metoprolol but has only been taking once a day. States at times her BP will run low and HR always high in the morning.   Her thyroid function studies were also all she had repeat labs that showed her thyroid studies are now within normal range. She is currently on levothyroxine 100 g once a day  She also has chronic kidney disease her creatinine is typically around 1.4 which she is currently at her baseline.   Reviewed labs  Review Of Systems:  GEN- denies fatigue, fever, weight loss,weakness, recent illness HEENT- denies eye drainage, change in vision, nasal discharge, CVS- denies chest pain, palpitations RESP- denies SOB, cough, wheeze ABD- denies N/V, change in stools, abd pain GU- denies dysuria, hematuria, dribbling, incontinence MSK- denies joint pain, muscle aches, injury Neuro- denies headache, dizziness, syncope, seizure activity       Objective:    BP 118/64   Pulse 96   Temp 98.4 F (36.9 C) (Oral)   Resp 14   Ht 5\' 4"  (1.626 m)   Wt 152 lb (68.9 kg)   LMP 06/18/2013 Comment: irregular  SpO2 99%   BMI 26.09 kg/m  GEN- NAD, alert and oriented x3 HEENT- PERRL, EOMI, non injected sclera, pink conjunctiva, MMM, oropharynx clear CVS- RRR, no murmur RESP-CTAB EXT- No edema Pulses- Radial 2+        Assessment & Plan:      Problem List Items Addressed This Visit    NICOTINE ADDICTION - Primary    counsled on cessation, she is contemplating quitting      Hypothyroidism    TFT at goal, no changes       Essential hypertension, benign    Once again reiterated how to  take her BP medications, not to stop BB due to heart rate concerns, if BP is running low, can decrease norvasc back to 5mg   Her inconsistecy makes treating her BP difficult  Also will see endocrine this week for DM, has had low CBG advised to decease Lantus to  insulin to 20 units for now       CKD (chronic kidney disease) stage 3, GFR 30-59 ml/min    Cr at baseline 1.4         Note: This dictation was prepared with Dragon dictation along with smaller phrase technology. Any transcriptional errors that result from this process are unintentional.

## 2016-05-15 NOTE — Assessment & Plan Note (Signed)
Once again reiterated how to take her BP medications, not to stop BB due to heart rate concerns, if BP is running low, can decrease norvasc back to 5mg   Her inconsistecy makes treating her BP difficult  Also will see endocrine this week for DM, has had low CBG advised to decease Lantus to  insulin to 20 units for now

## 2016-05-15 NOTE — Assessment & Plan Note (Signed)
Cr at baseline 1.4

## 2016-05-15 NOTE — Patient Instructions (Addendum)
Take 1/2 tablet twice a day of the metoprolol Lantus 20units until seen by Dr. Dwyane Dee Reschedule your CT scan F/U  Change F/U to April

## 2016-05-17 ENCOUNTER — Ambulatory Visit: Payer: Self-pay | Admitting: Endocrinology

## 2016-05-23 LAB — HM DIABETES EYE EXAM

## 2016-05-25 ENCOUNTER — Encounter: Payer: Self-pay | Admitting: Family Medicine

## 2016-05-25 ENCOUNTER — Ambulatory Visit (INDEPENDENT_AMBULATORY_CARE_PROVIDER_SITE_OTHER): Payer: BLUE CROSS/BLUE SHIELD | Admitting: Endocrinology

## 2016-05-25 ENCOUNTER — Encounter: Payer: Self-pay | Admitting: Endocrinology

## 2016-05-25 VITALS — BP 132/84 | HR 96 | Ht 64.0 in | Wt 151.0 lb

## 2016-05-25 DIAGNOSIS — E038 Other specified hypothyroidism: Secondary | ICD-10-CM | POA: Diagnosis not present

## 2016-05-25 DIAGNOSIS — R000 Tachycardia, unspecified: Secondary | ICD-10-CM | POA: Diagnosis not present

## 2016-05-25 DIAGNOSIS — I10 Essential (primary) hypertension: Secondary | ICD-10-CM | POA: Diagnosis not present

## 2016-05-25 DIAGNOSIS — Z794 Long term (current) use of insulin: Secondary | ICD-10-CM

## 2016-05-25 DIAGNOSIS — E1165 Type 2 diabetes mellitus with hyperglycemia: Secondary | ICD-10-CM | POA: Diagnosis not present

## 2016-05-25 DIAGNOSIS — E063 Autoimmune thyroiditis: Secondary | ICD-10-CM

## 2016-05-25 LAB — BASIC METABOLIC PANEL
BUN: 14 mg/dL (ref 6–23)
CO2: 26 meq/L (ref 19–32)
Calcium: 9.2 mg/dL (ref 8.4–10.5)
Chloride: 113 mEq/L — ABNORMAL HIGH (ref 96–112)
Creatinine, Ser: 1.5 mg/dL — ABNORMAL HIGH (ref 0.40–1.20)
GFR: 47.06 mL/min — ABNORMAL LOW (ref >60.00)
Glucose, Bld: 97 mg/dL (ref 70–99)
Potassium: 4.1 mEq/L (ref 3.5–5.1)
SODIUM: 140 meq/L (ref 135–145)

## 2016-05-25 LAB — POCT GLYCOSYLATED HEMOGLOBIN (HGB A1C): HEMOGLOBIN A1C: 6.4

## 2016-05-25 NOTE — Progress Notes (Signed)
Patient ID: Kathryn Oconnell, female   DOB: Mar 12, 1966, 51 y.o.   MRN: [id number]           Reason for Appointment: Follow-up for Type 2 Diabetes  Referring physician: Dr. Buelah Manis   History of Present Illness:          Date of diagnosis of type 2 diabetes mellitus: 2000        Background history:   She had been on metformin for a few years but this was stopped in 1/16 when her renal function was impaired. She says she had been doing fairly well with this Trulicity 7.94 mg weekly In 10/16 she had significant hyperglycemia while taking oral hypoglycemic drugs and she was started on insulin She was switched back to oral agents with glipizide and Onglyza in 1/17 However subsequently blood sugars started going up and she was back on insulin in April 2017  Recent history:   INSULIN regimen is: LANTUS 24 in am, Novolog recently 5-10 U with large meals  Non-insulin hypoglycemic drugs the patient is taking are: Victoza 1.2 mg daily  A1c is now slightly better at 6.4, previously as high as 7.6  Current management, blood sugar patterns and problems identified:  She has been told twice to reduce her Lantus insulin because of low-normal blood sugars before supper time but she continues to take 24 units  She is not checking blood sugars very much especially recently but now is tending to have occasional symptoms of low sugars before suppertime  More recently she thinks she is not eating as much because decreased appetite and her blood sugars are somewhat lower  Highest blood sugar has been about 180, once late afternoon and once after supper  However recent readings after evening meal are below 110  She is eating fast food biscuit at [employer] in the mornings usually  Although previously was taking only 3 or 4 units of Novolog she is taking much more now on her own  Recently has had one low sugar documented after supper  She has not done her walking recently because of some issues with  fast heart rate  No side effects from Victoza which she is taking consistently   at suppertime          Side effects from medications have been: None  Compliance with the medical regimen: Fair HYPOGLYCEMIA: May feel shaky when symptomatic, does not always have symptoms with low sugar readings  Glucose monitoring:  done 1 times a day         Glucometer:  contour  Blood Glucose readings by time of day and averages from meter download:  Mean values apply above for all meters except median for One Touch  PRE-MEAL Fasting Lunch Dinner Bedtime Overall  Glucose range: 95  110  120-184  64-183    Mean/median:         Self-care: The diet that the patient has been following is: Reducing fats. Meal times are:  Breakfast is at 10 AM, Lunch: Usually none Dinner: 7-8 PM   Typical meal intake: Breakfast is at 10 am, rarely eating fast food biscuits.  Evening meal is meat or chicken with potatoes, greens, macaroni.  Snacks on chips.  She will drink water and some juice, usually diet drinks                Dietician visit, most recent: 6/17               Exercise: not walking on  treadmill, was about 1-2/7 days, about 30 minutes in the evening  Weight history: Previous range 119-196  Wt Readings from Last 3 Encounters:  05/25/16 151 lb (68.5 kg)  05/15/16 152 lb (68.9 kg)  03/21/16 145 lb (65.8 kg)    Glycemic control:   Lab Results  Component Value Date   HGBA1C 6.8 02/17/2016   HGBA1C 7.6 (H) 10/04/2015   HGBA1C 7.5 07/24/2015   Lab Results  Component Value Date   MICROALBUR 0.4 10/04/2015   LDLCALC 93 02/08/2016   CREATININE 1.46 (H) 05/10/2016   Lab Results  Component Value Date   MICRALBCREAT 13 10/04/2015    Other active problems discussed today: See review of systems   Allergies as of 05/25/2016      Reactions   Erythromycin    Imitrex [sumatriptan]    Sulfa Antibiotics Other (See Comments)   Causes stomach cramps   Topamax [topiramate]    Nausea and worsening        Medication List       Accurate as of 05/25/16  2:10 PM. Always use your most recent med list.          albuterol 108 (90 Base) MCG/ACT inhaler Commonly known as:  PROVENTIL HFA;VENTOLIN HFA Inhale 2 puffs into the lungs every 6 (six) hours as needed for wheezing or shortness of breath.   amLODipine 10 MG tablet Commonly known as:  NORVASC Take 1 tablet (10 mg total) by mouth daily.   aspirin 81 MG tablet Take 81 mg by mouth daily.   atorvastatin 20 MG tablet Commonly known as:  LIPITOR Take 1 tablet (20 mg total) by mouth daily.   chlorhexidine 0.12 % solution Commonly known as:  PERIDEX Use as directed 15 mLs in the mouth or throat 2 (two) times daily.   CONTOUR NEXT EZ MONITOR w/Device Kit Use to monitor FSBS 2x daily. Dx: E11.9.   cyclobenzaprine 10 MG tablet Commonly known as:  FLEXERIL TAKE ONE TABLET BY MOUTH THREE TIMES DAILY AS NEEDED   fluticasone 50 MCG/ACT nasal spray Commonly known as:  FLONASE Place 2 sprays into the nose daily. For sinuses/allergies   glucose blood test strip Commonly known as:  BAYER CONTOUR NEXT TEST Use to monitor FSBS 2x daily. Dx: E11.9.   HYDROcodone-acetaminophen 10-325 MG tablet Commonly known as:  NORCO Take 1 tablet by mouth every 6 (six) hours as needed.   insulin aspart 100 UNIT/ML FlexPen Commonly known as:  NOVOLOG FLEXPEN Inject 5 to 60 units twice daily with meals as needed per sliding scale.   LANTUS SOLOSTAR [place] Inject into the skin. 24 units daily   levothyroxine 100 MCG tablet Commonly known as:  SYNTHROID, LEVOTHROID Take 1 tablet (100 mcg total) by mouth daily.   LORazepam 0.5 MG tablet Commonly known as:  ATIVAN 1 tab every 8 hours as needed   medroxyPROGESTERone 2.5 MG tablet Commonly known as:  PROVERA Take 1 tablet (2.5 mg total) by mouth daily.   metoprolol tartrate 25 MG tablet Commonly known as:  LOPRESSOR Take 0.5 tablets (12.5 mg total) by mouth 2 (two) times daily.   ONE TOUCH  LANCETS Misc Use to monitor FSBS 1x daily. Dx: E11.9.   RELION PEN NEEDLES 31G X 6 MM Misc Generic drug:  Insulin Pen Needle USE AS DIRECTED   RELION PEN NEEDLES 31G X 6 MM Misc Generic drug:  Insulin Pen Needle USE AS DIRECTED   valACYclovir 1000 MG tablet Commonly known as:  VALTREX Take 1 tablet (  1,000 mg total) by mouth daily.   VICTOZA 18 MG/3ML Sopn Generic drug:  liraglutide INJECT 1.2 MGS SUBCUTANEOUSLY ONCE DAILY AT THE SAME TIME   zolpidem 12.5 MG CR tablet Commonly known as:  AMBIEN CR TAKE ONE TABLET BY MOUTH AT BEDTIME AS NEEDED FOR SLEEP       Allergies:  Allergies  Allergen Reactions  . Erythromycin   . Imitrex [Sumatriptan]   . Sulfa Antibiotics Other (See Comments)    Causes stomach cramps  . Topamax [Topiramate]     Nausea and worsening    Past Medical History:  Diagnosis Date  . Anxiety   . BV (bacterial vaginosis) 06/03/2013  . Chronic kidney disease   . COPD (chronic obstructive pulmonary disease) ([place])   . Depression   . Diabetes mellitus without complication ([place])   . Eczema   . Graves disease   . HSV-2 infection   . Hyperlipidemia   . Hypertension   . Hypothyroid   . Labial lesion 06/25/2015  . LLQ pain 02/18/2014  . Lung nodule   . LUQ pain 02/18/2014  . Menopausal vaginal dryness 02/24/2014  . Migraines   . Neuropathy ([place])   . Tobacco use   . Trouble in sleeping 06/25/2015  . Unspecified symptom associated with female genital organs 06/03/2013  . Vaginal discharge 06/03/2013  . Weight loss 02/18/2014  . Yeast infection 06/17/2013    Past Surgical History:  Procedure Laterality Date  . CESAREAN SECTION    . FOOT SURGERY     5th digit left foot / Great toe, 2ND TOE  . THYROID SURGERY      Family History  Problem Relation Age of Onset  . Stroke Mother     x 3  . Heart disease Mother   . Cancer Mother     breast   . Thyroid disease Mother   . Hyperlipidemia Mother   . Heart disease Father   . Cancer Father     stomach  cancer   . Hypertension Father   . Heart attack Father   . Gout Brother   . Migraines Son   . Hypertension Paternal Uncle   . Heart disease Maternal Grandmother   . Heart attack Maternal Grandmother   . Heart disease Maternal Grandfather   . Heart attack Maternal Grandfather   . Cancer Paternal Grandmother   . Diabetes Paternal Grandfather   . Asthma Daughter     Social History:  reports that she has been smoking Cigarettes.  She has a 17.50 pack-year smoking history. She has never used smokeless tobacco. She reports that she does not drink alcohol or use drugs.   Review of Systems  RENAL function abnormalities: She has had slightly high creatinine and this appears unchanged  Lipid history:  on Lipitor 20 mg with adequate control    Lab Results  Component Value Date   CHOL 148 02/08/2016   HDL 41 (L) 02/08/2016   LDLCALC 93 02/08/2016   LDLDIRECT 95 10/04/2015   TRIG 68 02/08/2016   CHOLHDL 3.6 02/08/2016           Hypertension: mild, on metoprolol 12.5 mg twice a day She has been followed by PCP but although her PCP told her to reduce her amlodipine to 5 mg she is not taking this now   BP Readings from Last 3 Encounters:  05/25/16 132/84  05/15/16 118/64  03/21/16 (!) 148/68    Most recent eye exam was In 1/17  Most recent foot exam:  8/17  HYPOTHYROIDISM: She has had A higher TSH done in December by PCP The dose was increased from 88 up to 100 g now and last TSH is back  down No fatigue   Lab Results  Component Value Date   TSH 1.16 05/10/2016   TSH 7.75 (H) 03/10/2016   TSH 2.71 11/17/2015   FREET4 0.88 11/17/2015   FREET4 1.17 03/24/2015   FREET4 1.07 07/28/2014    LABS:  Abstract on 05/25/2016  Component Date Value Ref Range Status  . HM Diabetic Eye Exam 05/23/2016 No Retinopathy  No Retinopathy Final    Physical Examination:  BP 132/84   Pulse 96   Ht 5' 4"  (1.626 m)   Wt 151 lb (68.5 kg)   LMP 06/18/2013 Comment: irregular  SpO2  96%   BMI 25.92 kg/m    ASSESSMENT:  Diabetes type 2, BMI 27  See history of present illness for detailed discussion of current diabetes management, blood sugar patterns and problems identified  A1c is now below 7 at 6.8  Blood sugars are generally well controlled She appears to be getting fairly good postprandial control with Victoza and not usually using Novolog However has not checked enough readings after meals to know whether she needs NovoLog consistently or not Also on occasion has had low sugars with trying NovoLog and not eating as much FASTING blood sugars have not been monitored but may be low normal especially since after breakfast her readings are generally low normal also without NovoLog usually Improved A1c is likely indicates good control overall However does need to avoid hypoglycemia as above  RENAL dysfunction: Last creatinine 1.46 and will recheck it again  Hypertension: Followed by PCP Currently only on low-dose metoprolol and blood pressure appears to be getting relatively higher pulse rate   PLAN:    Will reduce her LANTUS insulin down to 20 units and she can take it at suppertime instead of morning to avoid tendency to low sugars before supper.  Avoid fast food high-fat biscuits  More consistent glucose monitoring at various times including fasting  Restart regular exercise with walking  Avoid taking more than 8 units of insulin at mealtimes, may continue trying to take about 5 units for average meals  A1c on the next visit  For her hypertension recommended that she increase her metoprolol to 25 mg twice a day for more consistent blood pressure and heart rate controlled She can discuss using extended release metoprolol with PCP May need amlodipine again if she has consistently high blood pressure readings  Recheck TSH today with dosage change being made couple of months ago  Patient Instructions  Lantus at supper and try 20 U No fast food  5-8  Novolog at meals  Metoprolol 1 twice daily       Counseling time on subjects discussed above is over 50% of today's 25 minute visit  Vaniah Chambers 05/25/2016, 2:10 PM   Note: This office note was prepared with Estate agent. Any transcriptional errors that result from this process are unintentional.

## 2016-05-25 NOTE — Patient Instructions (Addendum)
Lantus at supper and try 20 U No fast food  5-8 Novolog at meals  Metoprolol 1 twice daily

## 2016-05-25 NOTE — Addendum Note (Signed)
Addended by: Nile Riggs on: 05/25/2016 02:59 PM   Modules accepted: Orders

## 2016-05-26 LAB — TSH: TSH: 1.33 u[IU]/mL (ref 0.35–4.50)

## 2016-05-29 ENCOUNTER — Other Ambulatory Visit: Payer: Self-pay | Admitting: Family Medicine

## 2016-05-29 ENCOUNTER — Encounter: Payer: Self-pay | Admitting: Family Medicine

## 2016-05-30 MED ORDER — HYDROCODONE-ACETAMINOPHEN 10-325 MG PO TABS: 1.0000 | ORAL_TABLET | Freq: Four times a day (QID) | ORAL | 0 refills | Status: DC | PRN

## 2016-05-30 MED ORDER — VALACYCLOVIR HCL 1 G PO TABS: 1000.0000 mg | ORAL_TABLET | Freq: Every day | ORAL | 12 refills | Status: DC

## 2016-06-06 ENCOUNTER — Other Ambulatory Visit: Payer: Self-pay | Admitting: Family Medicine

## 2016-06-06 ENCOUNTER — Encounter: Payer: Self-pay | Admitting: Family Medicine

## 2016-06-07 ENCOUNTER — Telehealth: Payer: Self-pay | Admitting: Family Medicine

## 2016-06-07 DIAGNOSIS — R000 Tachycardia, unspecified: Secondary | ICD-10-CM

## 2016-06-07 DIAGNOSIS — I10 Essential (primary) hypertension: Secondary | ICD-10-CM

## 2016-06-07 MED ORDER — METOPROLOL TARTRATE 25 MG PO TABS: 25.0000 mg | ORAL_TABLET | Freq: Two times a day (BID) | ORAL | 3 refills | Status: DC

## 2016-06-07 MED ORDER — ATORVASTATIN CALCIUM 20 MG PO TABS: 20.0000 mg | ORAL_TABLET | Freq: Every day | ORAL | 0 refills | Status: DC

## 2016-06-07 MED ORDER — AMLODIPINE BESYLATE 10 MG PO TABS: 5.0000 mg | ORAL_TABLET | Freq: Every day | ORAL | 1 refills | Status: DC

## 2016-06-07 MED ORDER — LORAZEPAM 0.5 MG PO TABS: ORAL_TABLET | ORAL | 2 refills | Status: DC

## 2016-06-07 NOTE — Telephone Encounter (Signed)
Patient called in states she took her B/p and HR this morning when she first got up her B/P 119/72 and her HR 131 she states her B/P is going down but her HR is still going up. Please advise.    CB# [phone]

## 2016-06-07 NOTE — Telephone Encounter (Signed)
Call placed to patient. Vayas.   Prescription sent to pharmacy.

## 2016-06-07 NOTE — Telephone Encounter (Signed)
Have her sit for a minute and recheck HR, if she is sitting and HR is still > 100  Then adjust meds  Take 1/2 tablet of Norvasc  -- Decreasing this so BP wont go to low with the higher dose of metoprolol) Take 1 full tablet of metprolol 25mg  BID  Get appt with cardiologist

## 2016-06-07 NOTE — Telephone Encounter (Signed)
Call placed to patient to inquire.   States that she has noted BP has been running WNL, but HR has been elevated recently. States that HR remains 90-120, but this AM noted above 130.  MD please advise.

## 2016-06-07 NOTE — Telephone Encounter (Signed)
Refill being handled in another message.

## 2016-06-08 NOTE — Telephone Encounter (Signed)
Call placed to patient. LMTRC.  

## 2016-06-09 ENCOUNTER — Encounter: Payer: Self-pay | Admitting: *Deleted

## 2016-06-09 ENCOUNTER — Ambulatory Visit: Payer: BLUE CROSS/BLUE SHIELD | Admitting: Family Medicine

## 2016-06-09 NOTE — Telephone Encounter (Signed)
Multiple calls placed to patient with no answer and no return call.  ° °Message to be closed.  ° °Message sent via MyChart.  °

## 2016-06-13 NOTE — Telephone Encounter (Signed)
Received call from patient.   States that BO noted at 117/82 this AM with HR of 131. Reports that she is taking Amlodipine 5mg  PO QD and Metoprolol 25mg  PO BID.   Patient states that she requires referral for cards. Referral orders placed.   Appointment scheduled with PCP for re-check on Friday per patient request.   MD please advise.

## 2016-06-14 NOTE — Telephone Encounter (Signed)
Noted, needs OV to check seems  this is intermittant Cardiology referral

## 2016-06-16 ENCOUNTER — Ambulatory Visit: Payer: Self-pay | Admitting: Family Medicine

## 2016-06-19 ENCOUNTER — Encounter: Payer: Self-pay | Admitting: Family Medicine

## 2016-06-19 ENCOUNTER — Ambulatory Visit (INDEPENDENT_AMBULATORY_CARE_PROVIDER_SITE_OTHER): Payer: BLUE CROSS/BLUE SHIELD | Admitting: Family Medicine

## 2016-06-19 VITALS — BP 120/64 | HR 86 | Temp 98.4°F | Resp 14 | Ht 64.0 in | Wt 154.0 lb

## 2016-06-19 DIAGNOSIS — F331 Major depressive disorder, recurrent, moderate: Secondary | ICD-10-CM | POA: Diagnosis not present

## 2016-06-19 DIAGNOSIS — F5101 Primary insomnia: Secondary | ICD-10-CM

## 2016-06-19 DIAGNOSIS — I10 Essential (primary) hypertension: Secondary | ICD-10-CM | POA: Diagnosis not present

## 2016-06-19 DIAGNOSIS — F419 Anxiety disorder, unspecified: Secondary | ICD-10-CM

## 2016-06-19 DIAGNOSIS — R000 Tachycardia, unspecified: Secondary | ICD-10-CM | POA: Diagnosis not present

## 2016-06-19 MED ORDER — HYDROCODONE-ACETAMINOPHEN 10-325 MG PO TABS: 1.0000 | ORAL_TABLET | Freq: Four times a day (QID) | ORAL | 0 refills | Status: DC | PRN

## 2016-06-19 NOTE — Progress Notes (Signed)
Subjective:    Patient ID: Kathryn Oconnell, female    DOB: Jan 14, 1966, 51 y.o.   MRN: [id number]  Patient presents for Medication Management (irregular readings on HTN/ HR) and FMLA (intermittent FMLA- anxiety- will have forms faxed to office)  Patient here to discuss her tachycardia. She states her heart rate has been jumping all around up into the 130s she shows me a blood pressure heart rate 107 this morning the visit states that she had not been taking the beta blocker all the time as she was worried it would drop her blood pressure. He discusses Mc numerous time about stopping her beta blocker as she does have a-tachycardia. She has been taking 25 mg when she does take it. She denies any chest pain or palpitations currently. She does admit to a lot of anxiety and stress. 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 Her mother is now on hospice and she also is caring for her son. Her days are very long she often does not get home until 7 or 8:00 at night and skips meals. She is very stressed to the max. She feels like she cannot take a day off without some type of repercussions from her job. She has numerous appointments that are coming up as well which she needs some time off for. She is actually interested in seeing a therapist. She does take the lorazepam daily.   Review Of Systems:  GEN- denies fatigue, fever, weight loss,weakness, recent illness HEENT- denies eye drainage, change in vision, nasal discharge, CVS- denies chest pain, palpitations RESP- denies SOB, cough, wheeze ABD- denies N/V, change in stools, abd pain GU- denies dysuria, hematuria, dribbling, incontinence MSK- denies joint pain, muscle aches, injury Neuro- denies headache, dizziness, syncope, seizure activity       Objective:    BP 120/64   Pulse 86   Temp 98.4 F (36.9 C) (Oral)   Resp 14   Ht 5\' 4"  (1.626 m)   Wt 154 lb (69.9 kg)   LMP 06/18/2013 Comment:  irregular  BMI 26.43 kg/m  GEN- NAD, alert and oriented x3 HEENT- PERRL, EOMI, non injected sclera, pink conjunctiva, MMM, oropharynx clear Neck- Supple, no thyromegaly CVS- RRR, no murmur RESP-CTAB Psych- stressed appearing, not depressed, not overly anxious, no SI  EXT- No edema Pulses- Radial 2+        Assessment & Plan:      Problem List Items Addressed This Visit    Tachycardia    Current episodes of tachycardia though I have discussed with her taking her beta blocker on a regular basis and not skipping doses which makes the heart rate jump up. She is now returning back to 12.5 mg twice a day she is very worried about her blood pressure she is to take consistently. Working on an appointment with cardiology.  Her blood pressure is controlled. She is on a half a tablet of amlodipine. I did discuss if her blood pressure were to bottom up her brother her continue the beta blocker and stop the amlodipine though just a few months ago her blood pressure was significantly high which is why she is on both medications.  I think her stressors are the culprit of this as well as her long hours at work and she is not taking care for self and not very consistent with her medications in general. I will refer her to psychiatry today she would benefit from  therapy and prioritizing things with regards to her job. We will completed FMLA for that she can go to therapy as well as her endocrinology cardiology visits as she has multiple medications and comorbidities and need to be addressed.      Insomnia   Essential hypertension, benign - Primary   Depression   Anxiety      Note: This dictation was prepared with Dragon dictation along with smaller phrase technology. Any transcriptional errors that result from this process are unintentional.

## 2016-06-19 NOTE — Assessment & Plan Note (Signed)
Current episodes of tachycardia though I have discussed with her taking her beta blocker on a regular basis and not skipping doses which makes the heart rate jump up. She is now returning back to 12.5 mg twice a day she is very worried about her blood pressure she is to take consistently. Working on an appointment with cardiology.  Her blood pressure is controlled. She is on a half a tablet of amlodipine. I did discuss if her blood pressure were to bottom up her brother her continue the beta blocker and stop the amlodipine though just a few months ago her blood pressure was significantly high which is why she is on both medications.  I think her stressors are the culprit of this as well as her long hours at work and she is not taking care for self and not very consistent with her medications in general. I will refer her to psychiatry today she would benefit from therapy and prioritizing things with regards to her job. We will completed FMLA for that she can go to therapy as well as her endocrinology cardiology visits as she has multiple medications and comorbidities and need to be addressed.

## 2016-06-19 NOTE — Patient Instructions (Addendum)
Take metoprolol 1/2 tablet twice a day every day to keep heart rate consistent Referral to psychiatry Change f/u to May  GIVE NOTE FOR WORK FOR TODAY

## 2016-07-01 ENCOUNTER — Encounter: Payer: Self-pay | Admitting: Family Medicine

## 2016-07-03 MED ORDER — LORAZEPAM 1 MG PO TABS: 1.0000 mg | ORAL_TABLET | Freq: Three times a day (TID) | ORAL | 1 refills | Status: DC | PRN

## 2016-07-04 ENCOUNTER — Other Ambulatory Visit: Payer: Self-pay | Admitting: Family Medicine

## 2016-07-08 ENCOUNTER — Encounter: Payer: Self-pay | Admitting: Endocrinology

## 2016-07-08 ENCOUNTER — Other Ambulatory Visit: Payer: Self-pay | Admitting: Endocrinology

## 2016-07-10 ENCOUNTER — Other Ambulatory Visit: Payer: Self-pay

## 2016-07-10 ENCOUNTER — Ambulatory Visit: Payer: BLUE CROSS/BLUE SHIELD | Admitting: Family Medicine

## 2016-07-10 MED ORDER — VICTOZA 18 MG/3ML ~~LOC~~ SOPN: PEN_INJECTOR | SUBCUTANEOUS | 3 refills | Status: DC

## 2016-07-24 ENCOUNTER — Other Ambulatory Visit: Payer: Self-pay | Admitting: Family Medicine

## 2016-07-24 ENCOUNTER — Encounter: Payer: Self-pay | Admitting: Family Medicine

## 2016-07-25 ENCOUNTER — Encounter: Payer: Self-pay | Admitting: Family Medicine

## 2016-07-25 MED ORDER — HYDROCODONE-ACETAMINOPHEN 10-325 MG PO TABS: 1.0000 | ORAL_TABLET | Freq: Four times a day (QID) | ORAL | 0 refills | Status: DC | PRN

## 2016-07-29 ENCOUNTER — Other Ambulatory Visit: Payer: Self-pay | Admitting: Family Medicine

## 2016-07-31 ENCOUNTER — Other Ambulatory Visit: Payer: Self-pay | Admitting: Adult Health

## 2016-07-31 ENCOUNTER — Encounter: Payer: Self-pay | Admitting: Adult Health

## 2016-07-31 ENCOUNTER — Encounter: Payer: Self-pay | Admitting: Family Medicine

## 2016-07-31 MED ORDER — MEDROXYPROGESTERONE ACETATE 2.5 MG PO TABS: 2.5000 mg | ORAL_TABLET | Freq: Every day | ORAL | 11 refills | Status: DC

## 2016-07-31 NOTE — Telephone Encounter (Signed)
Ok to refill??  Last office visit 06/19/2016.  Last refill 03/14/2016, #3 refills.

## 2016-07-31 NOTE — Telephone Encounter (Signed)
Okay to refill? 

## 2016-07-31 NOTE — Telephone Encounter (Signed)
Medication called to pharmacy. 

## 2016-08-09 ENCOUNTER — Ambulatory Visit: Payer: BLUE CROSS/BLUE SHIELD | Admitting: Family Medicine

## 2016-08-15 ENCOUNTER — Ambulatory Visit (INDEPENDENT_AMBULATORY_CARE_PROVIDER_SITE_OTHER): Payer: BLUE CROSS/BLUE SHIELD | Admitting: Family Medicine

## 2016-08-15 ENCOUNTER — Encounter: Payer: Self-pay | Admitting: Family Medicine

## 2016-08-15 VITALS — BP 128/74 | HR 84 | Temp 98.1°F | Resp 16 | Ht 64.0 in | Wt 153.0 lb

## 2016-08-15 DIAGNOSIS — I10 Essential (primary) hypertension: Secondary | ICD-10-CM | POA: Diagnosis not present

## 2016-08-15 DIAGNOSIS — J3489 Other specified disorders of nose and nasal sinuses: Secondary | ICD-10-CM | POA: Diagnosis not present

## 2016-08-15 DIAGNOSIS — F419 Anxiety disorder, unspecified: Secondary | ICD-10-CM

## 2016-08-15 DIAGNOSIS — N183 Type 2 diabetes mellitus with diabetic chronic kidney disease: Secondary | ICD-10-CM

## 2016-08-15 DIAGNOSIS — E1122 Type 2 diabetes mellitus with diabetic chronic kidney disease: Secondary | ICD-10-CM | POA: Diagnosis not present

## 2016-08-15 DIAGNOSIS — F331 Major depressive disorder, recurrent, moderate: Secondary | ICD-10-CM

## 2016-08-15 MED ORDER — MUPIROCIN CALCIUM 2 % NA OINT: TOPICAL_OINTMENT | NASAL | 0 refills | Status: DC

## 2016-08-15 MED ORDER — HYDROCODONE-ACETAMINOPHEN 10-325 MG PO TABS: 1.0000 | ORAL_TABLET | Freq: Four times a day (QID) | ORAL | 0 refills | Status: DC | PRN

## 2016-08-15 NOTE — Assessment & Plan Note (Signed)
Controlled and HR today, keep f/u with cardiology

## 2016-08-15 NOTE — Assessment & Plan Note (Signed)
Per above, will complete disability paperwork as needed to give her time for grief when her mother passes.

## 2016-08-15 NOTE — Assessment & Plan Note (Signed)
All by endocrinology diabetes has been improving continue current medications.

## 2016-08-15 NOTE — Assessment & Plan Note (Signed)
Worsening depression in the setting of her mother being on hospice. During she would benefit from therapy and will likely need after her mother passed this to help her do this time. She will continue her current medication regimen. Hospice has also been helping.

## 2016-08-15 NOTE — Patient Instructions (Signed)
F/U 4 months  

## 2016-08-15 NOTE — Progress Notes (Signed)
Subjective:    Patient ID: Kathryn Oconnell, female    DOB: 1965/05/07, 51 y.o.   MRN: [id number]  Patient presents for Follow-up  Pt here for f/u At her last visit she was per psychiatry secondary to some family stressors taking care of her mother her son and difficulty on her job. She tells me that her mother is now on hospice. She's been staying at the hospital with her for the past week  Doctors expect her to die at any time. This has been very difficult for her. She states the family members are not coming in to sit with her mother and she is afraid that she will die of her own if she leaves the hospital. She isn't taking her lorazepam which helps. She still has not her back in a therapist but is still willing to see one. She tells me that she does not think she'll be able to work directly after her mother passing away if things are very difficult. She would like to take some time off from work when that occurs.  She also has history of tachycardia and hypertension we discussed taking her blood pressure medication process consistently. She is to take metoprolol 12.5 twice a day and a half a tablet of amlodipine.She has appointment with cardiology at the end of the month  She still following with her endocrinologist Dr. Dwyane Dee for her diabetes. Last A1c was 6.4% in February thyroid was also normal. Her metabolic panel showed her chronic kidney disease creatinine 1.5 which is around her baseline. Lipids were at goal in November 2017  Sore in nares has had before, taking zyrtec for allergies which has helped.     Review Of Systems:  GEN- denies fatigue, fever, weight loss,weakness, recent illness HEENT- denies eye drainage, change in vision, nasal discharge, CVS- denies chest pain, palpitations RESP- denies SOB, cough, wheeze ABD- denies N/V, change in stools, abd pain GU- denies dysuria, hematuria, dribbling, incontinence MSK- denies joint pain, muscle aches, injury Neuro- denies headache,  dizziness, syncope, seizure activity       Objective:    BP 128/74   Pulse 84   Temp 98.1 F (36.7 C) (Oral)   Resp 16   Ht 5\' 4"  (1.626 m)   Wt 153 lb (69.4 kg)   LMP 06/18/2013 Comment: irregular  SpO2 98%   BMI 26.26 kg/m  GEN- NAD, alert and oriented x3 HEENT- PERRL, EOMI, non injected sclera, pink conjunctiva, MMM, oropharynx clear,nares small scab ant  Left nares CVS- RRR, no murmur RESP-CTAB Psych- tearful throughout exam, not anxious appearing, no SI EXT- No edema Pulses- Radial,  2+        Assessment & Plan:      Problem List Items Addressed This Visit    Essential hypertension, benign - Primary    Controlled and HR today, keep f/u with cardiology      Diabetes mellitus ([place])    All by endocrinology diabetes has been improving continue current medications.      Depression    Worsening depression in the setting of her mother being on hospice. During she would benefit from therapy and will likely need after her mother passed this to help her do this time. She will continue her current medication regimen. Hospice has also been helping.      Anxiety    Per above, will complete disability paperwork as needed to give her time for grief when her mother passes.       Other  Visit Diagnoses    Nasal sore       Treat with Drue Stager has been in hospital as well, so cover MRSA      Note: This dictation was prepared with Dragon dictation along with smaller phrase technology. Any transcriptional errors that result from this process are unintentional.

## 2016-08-16 ENCOUNTER — Telehealth: Payer: Self-pay | Admitting: Cardiology

## 2016-08-17 NOTE — Telephone Encounter (Signed)
close encounter °

## 2016-08-25 ENCOUNTER — Other Ambulatory Visit: Payer: Self-pay

## 2016-08-25 ENCOUNTER — Telehealth: Payer: Self-pay | Admitting: Family Medicine

## 2016-08-25 ENCOUNTER — Encounter: Payer: Self-pay | Admitting: Endocrinology

## 2016-08-25 ENCOUNTER — Ambulatory Visit (INDEPENDENT_AMBULATORY_CARE_PROVIDER_SITE_OTHER): Payer: BLUE CROSS/BLUE SHIELD | Admitting: Endocrinology

## 2016-08-25 VITALS — BP 134/86 | HR 67 | Ht 64.0 in | Wt 151.4 lb

## 2016-08-25 DIAGNOSIS — E1165 Type 2 diabetes mellitus with hyperglycemia: Secondary | ICD-10-CM

## 2016-08-25 DIAGNOSIS — E1142 Type 2 diabetes mellitus with diabetic polyneuropathy: Secondary | ICD-10-CM | POA: Diagnosis not present

## 2016-08-25 DIAGNOSIS — E063 Autoimmune thyroiditis: Secondary | ICD-10-CM

## 2016-08-25 DIAGNOSIS — Z794 Long term (current) use of insulin: Secondary | ICD-10-CM

## 2016-08-25 DIAGNOSIS — N289 Disorder of kidney and ureter, unspecified: Secondary | ICD-10-CM

## 2016-08-25 LAB — COMPREHENSIVE METABOLIC PANEL
ALK PHOS: 75 U/L (ref 39–117)
ALT: 31 U/L (ref 0–35)
AST: 29 U/L (ref 0–37)
Albumin: 4.2 g/dL (ref 3.5–5.2)
BILIRUBIN TOTAL: 0.5 mg/dL (ref 0.2–1.2)
BUN: 16 mg/dL (ref 6–23)
CALCIUM: 9.6 mg/dL (ref 8.4–10.5)
CO2: 26 mEq/L (ref 19–32)
Chloride: 106 mEq/L (ref 96–112)
Creatinine, Ser: 1.29 mg/dL — ABNORMAL HIGH (ref 0.40–1.20)
GFR: 55.95 mL/min — ABNORMAL LOW (ref >60.00)
Glucose, Bld: 205 mg/dL — ABNORMAL HIGH (ref 70–99)
Potassium: 4.7 mEq/L (ref 3.5–5.1)
SODIUM: 136 meq/L (ref 135–145)
TOTAL PROTEIN: 7.3 g/dL (ref 6.0–8.3)

## 2016-08-25 LAB — MICROALBUMIN / CREATININE URINE RATIO
Creatinine,U: 133.5 mg/dL
MICROALB/CREAT RATIO: 3.2 mg/g (ref 0.0–30.0)
Microalb, Ur: 4.3 mg/dL — ABNORMAL HIGH (ref 0.0–1.9)

## 2016-08-25 LAB — TSH: TSH: 0.68 u[IU]/mL (ref 0.35–4.50)

## 2016-08-25 LAB — T4, FREE: FREE T4: 1.1 ng/dL (ref 0.60–1.60)

## 2016-08-25 LAB — POCT GLYCOSYLATED HEMOGLOBIN (HGB A1C): Hemoglobin A1C: 7.6

## 2016-08-25 MED ORDER — PREGABALIN 50 MG PO CAPS: 50.0000 mg | ORAL_CAPSULE | Freq: Two times a day (BID) | ORAL | 1 refills | Status: DC

## 2016-08-25 NOTE — Progress Notes (Signed)
Patient ID: Kathryn Oconnell, female   DOB: 07/05/1965, 51 y.o.   MRN: [id number]           Reason for Appointment: Follow-up for Type 2 Diabetes  Referring physician: Dr. Buelah Manis   History of Present Illness:          Date of diagnosis of type 2 diabetes mellitus: 2000        Background history:   She had been on metformin for a few years but this was stopped in 1/16 when her renal function was impaired. She says she had been doing fairly well with this Trulicity 2.09 mg weekly In 10/16 she had significant hyperglycemia while taking oral hypoglycemic drugs and she was started on insulin She was switched back to oral agents with glipizide and Onglyza in 1/17 However subsequently blood sugars started going up and she was back on insulin in April 2017  Recent history:   INSULIN regimen is: LANTUS 20 in am, Novolog recently 5-10 U with large meals  Non-insulin hypoglycemic drugs the patient is taking are: Victoza 1.2 mg daily  A1c is much higher than usual at 7.6, previously 6.4  Current management, blood sugar patterns and problems identified:  She has been told to reduce her Lantus to 24 units to avoid tendency to low sugars/hypoglycemic symptoms before suppertime  She thinks that she may still get occasional low sugars at suppertime, these are not documented  However because of her mother's terminal illness she has not checked her sugar much and probably has had more stress affecting her sugar levels  She has taken her insulin consistently as directed  Not adjusting NovoLog based on what she is eating especially in the evening  She thinks her biggest meal is breakfast but has not done any readings after morning meal, this may also be at a fast food restaurant frequently  Also no fasting readings done recently  Readings after supper are not consistent with only 5 readings recently which are at target twice but significantly high once and lowest reading 54         Side  effects from medications have been: None  Compliance with the medical regimen: Fair HYPOGLYCEMIA: May feel shaky when symptomatic, does not always have symptoms with low sugar readings  Glucose monitoring:  done 1 times a day         Glucometer:  contour  Blood Glucose readings by time of day and averages from meter download:  Mean values apply above for all meters except median for One Touch  PRE-MEAL Fasting Lunch Dinner Bedtime Overall  Glucose range:    64-2 81    Mean/median:    145      Self-care: The diet that the patient has been following is: Reducing fats. Meal times are:  Breakfast is at 10 AM, Lunch: Usually none Dinner: 7-8 PM   Typical meal intake: Breakfast is at 10 am, rarely eating fast food biscuits.  Evening meal is meat or chicken with potatoes, greens, macaroni.  Snacks on chips.  She will drink water and some juice, usually diet drinks                Dietician visit, most recent: 6/17               Exercise: not walking on treadmill, was about 1-2/7 days, about 30 minutes in the evening  Weight history: Previous range 119-196  Wt Readings from Last 3 Encounters:  08/25/16 151 lb 6.4 oz (  68.7 kg)  08/15/16 153 lb (69.4 kg)  06/19/16 154 lb (69.9 kg)    Glycemic control:   Lab Results  Component Value Date   HGBA1C 7.6 08/25/2016   HGBA1C 6.4 05/25/2016   HGBA1C 6.8 02/17/2016   Lab Results  Component Value Date   MICROALBUR 4.3 (H) 08/25/2016   LDLCALC 93 02/08/2016   CREATININE 1.29 (H) 08/25/2016   Lab Results  Component Value Date   MICRALBCREAT 3.2 08/25/2016    Other active problems discussed today: See review of systems   Allergies as of 08/25/2016      Reactions   Erythromycin    Imitrex [sumatriptan]    Sulfa Antibiotics Other (See Comments)   Causes stomach cramps   Topamax [topiramate]    Nausea and worsening      Medication List       Accurate as of 08/25/16 11:59 PM. Always use your most recent med list.            amLODipine 10 MG tablet Commonly known as:  NORVASC Take 0.5 tablets (5 mg total) by mouth daily.   aspirin 81 MG tablet Take 81 mg by mouth daily.   atorvastatin 20 MG tablet Commonly known as:  LIPITOR Take 1 tablet (20 mg total) by mouth daily.   CONTOUR NEXT EZ MONITOR w/Device Kit Use to monitor FSBS 2x daily. Dx: E11.9.   cyclobenzaprine 10 MG tablet Commonly known as:  FLEXERIL TAKE 1 TABLET BY MOUTH THREE TIMES DAILY AS NEEDED   fluticasone 50 MCG/ACT nasal spray Commonly known as:  FLONASE Place 2 sprays into the nose daily. For sinuses/allergies   glucose blood test strip Commonly known as:  BAYER CONTOUR NEXT TEST Use to monitor FSBS 2x daily. Dx: E11.9.   HYDROcodone-acetaminophen 10-325 MG tablet Commonly known as:  NORCO Take 1 tablet by mouth every 6 (six) hours as needed.   insulin aspart 100 UNIT/ML FlexPen Commonly known as:  NOVOLOG FLEXPEN Inject 5 to 60 units twice daily with meals as needed per sliding scale.   LANTUS SOLOSTAR [place] Inject 20 Units into the skin.   levothyroxine 100 MCG tablet Commonly known as:  SYNTHROID, LEVOTHROID Take 1 tablet (100 mcg total) by mouth daily.   LORazepam 1 MG tablet Commonly known as:  ATIVAN Take 1 tablet (1 mg total) by mouth 3 (three) times daily as needed for anxiety.   medroxyPROGESTERone 2.5 MG tablet Commonly known as:  PROVERA Take 1 tablet (2.5 mg total) by mouth daily.   metoprolol tartrate 25 MG tablet Commonly known as:  LOPRESSOR Take 1 tablet (25 mg total) by mouth 2 (two) times daily.   mupirocin nasal ointment 2 % Commonly known as:  BACTROBAN NASAL Apply to nares twice a day   ONE TOUCH LANCETS Misc Use to monitor FSBS 1x daily. Dx: E11.9.   pregabalin 50 MG capsule Commonly known as:  LYRICA Take 1 capsule (50 mg total) by mouth 2 (two) times daily.   RELION PEN NEEDLES 31G X 6 MM Misc Generic drug:  Insulin Pen Needle USE AS DIRECTED   valACYclovir 1000 MG tablet Commonly  known as:  VALTREX Take 1 tablet (1,000 mg total) by mouth daily.   VICTOZA 18 MG/3ML Sopn Generic drug:  liraglutide INJECT 1.2 MGS SUBCUTANEOUSLY ONCE DAILY AT THE SAME TIME   zolpidem 12.5 MG CR tablet Commonly known as:  AMBIEN CR TAKE ONE TABLET BY MOUTH AT BEDTIME AS NEEDED FOR SLEEP  Allergies:  Allergies  Allergen Reactions  . Erythromycin   . Imitrex [Sumatriptan]   . Sulfa Antibiotics Other (See Comments)    Causes stomach cramps  . Topamax [Topiramate]     Nausea and worsening    Past Medical History:  Diagnosis Date  . Anxiety   . BV (bacterial vaginosis) 06/03/2013  . Chronic kidney disease   . COPD (chronic obstructive pulmonary disease) ([place])   . Depression   . Diabetes mellitus without complication ([place])   . Eczema   . Graves disease   . HSV-2 infection   . Hyperlipidemia   . Hypertension   . Hypothyroid   . Labial lesion 06/25/2015  . LLQ pain 02/18/2014  . Lung nodule   . LUQ pain 02/18/2014  . Menopausal vaginal dryness 02/24/2014  . Migraines   . Neuropathy   . Tobacco use   . Trouble in sleeping 06/25/2015  . Unspecified symptom associated with female genital organs 06/03/2013  . Vaginal discharge 06/03/2013  . Weight loss 02/18/2014  . Yeast infection 06/17/2013    Past Surgical History:  Procedure Laterality Date  . CESAREAN SECTION    . FOOT SURGERY     5th digit left foot / Great toe, 2ND TOE  . THYROID SURGERY      Family History  Problem Relation Age of Onset  . Stroke Mother        x 3  . Heart disease Mother   . Cancer Mother        breast   . Thyroid disease Mother   . Hyperlipidemia Mother   . Heart disease Father   . Cancer Father        stomach cancer   . Hypertension Father   . Heart attack Father   . Gout Brother   . Migraines Son   . Hypertension Paternal Uncle   . Heart disease Maternal Grandmother   . Heart attack Maternal Grandmother   . Heart disease Maternal Grandfather   . Heart attack Maternal  Grandfather   . Cancer Paternal Grandmother   . Diabetes Paternal Grandfather   . Asthma Daughter     Social History:  reports that she has been smoking Cigarettes.  She has a 17.50 pack-year smoking history. She has never used smokeless tobacco. She reports that she does not drink alcohol or use drugs.   Review of Systems   NEUROPATHY: She is now complaining of constant pain in her feet and lower legs and more on lying down at night without much sharp pains or pins and needles, she does not think gabapentin health and she wants Lyrica  RENAL function abnormalities: She has had slightly high creatinine and this appears unchanged  Lipid history:  on Lipitor 20 mg with adequate control    Lab Results  Component Value Date   CHOL 148 02/08/2016   HDL 41 (L) 02/08/2016   LDLCALC 93 02/08/2016   LDLDIRECT 95 10/04/2015   TRIG 68 02/08/2016   CHOLHDL 3.6 02/08/2016           Hypertension: mild, on metoprolol 12.5 mg twice a day along with amlodipine She has been followed by PCP    BP Readings from Last 3 Encounters:  08/25/16 134/86  08/15/16 128/74  06/19/16 120/64    Most recent eye exam was In 1/17  Most recent foot exam: 8/17  HYPOTHYROIDISM:  She is on long-term supplementation with levothyroxine The dose was increased from 88 up to 100 g Previously and  last TSH is normal She takes her levothyroxine regularly   Lab Results  Component Value Date   TSH 0.68 08/25/2016   TSH 1.33 05/25/2016   TSH 1.16 05/10/2016   FREET4 1.10 08/25/2016   FREET4 0.88 11/17/2015   FREET4 1.17 03/24/2015    LABS:  Office Visit on 08/25/2016  Component Date Value Ref Range Status  . Hemoglobin A1C 08/25/2016 7.6   Final  . Sodium 08/25/2016 136  135 - 145 mEq/L Final  . Potassium 08/25/2016 4.7  3.5 - 5.1 mEq/L Final  . Chloride 08/25/2016 106  96 - 112 mEq/L Final  . CO2 08/25/2016 26  19 - 32 mEq/L Final  . Glucose, Bld 08/25/2016 205* 70 - 99 mg/dL Final  . BUN  08/25/2016 16  6 - 23 mg/dL Final  . Creatinine, Ser 08/25/2016 1.29* 0.40 - 1.20 mg/dL Final  . Total Bilirubin 08/25/2016 0.5  0.2 - 1.2 mg/dL Final  . Alkaline Phosphatase 08/25/2016 75  39 - 117 U/L Final  . AST 08/25/2016 29  0 - 37 U/L Final  . ALT 08/25/2016 31  0 - 35 U/L Final  . Total Protein 08/25/2016 7.3  6.0 - 8.3 g/dL Final  . Albumin 08/25/2016 4.2  3.5 - 5.2 g/dL Final  . Calcium 08/25/2016 9.6  8.4 - 10.5 mg/dL Final  . GFR 08/25/2016 55.95* >60.00 mL/min Final  . TSH 08/25/2016 0.68  0.35 - 4.50 uIU/mL Final  . Free T4 08/25/2016 1.10  0.60 - 1.60 ng/dL Final   Comment: Specimens from patients who are undergoing biotin therapy and /or ingesting biotin supplements may contain high levels of biotin.  The higher biotin concentration in these specimens interferes with this Free T4 assay.  Specimens that contain high levels  of biotin may cause false high results for this Free T4 assay.  Please interpret results in light of the total clinical presentation of the patient.    Jacquelyne Balint, Ur 08/25/2016 4.3* 0.0 - 1.9 mg/dL Final  . Creatinine,U 08/25/2016 133.5  mg/dL Final  . Microalb Creat Ratio 08/25/2016 3.2  0.0 - 30.0 mg/g Final    Physical Examination:  BP 134/86   Pulse 67   Ht 5' 4"  (1.626 m)   Wt 151 lb 6.4 oz (68.7 kg)   LMP 06/18/2013 Comment: irregular  SpO2 98%   BMI 25.99 kg/m    ASSESSMENT:  Diabetes type 2, BMI 27  See history of present illness for detailed discussion of current diabetes management, blood sugar patterns and problems identified  She is on a regimen of basal bolus insulin and Victoza A1c is now higher than usual at 7.6, previously 6.8  Difficult to assess her pattern of blood sugars as she is monitoring very sporadically and not clear if her A1c being higher indicates higher postprandial or fasting readings Factors causing her high sugars are stress, probably not planning meals consistently especially eating out, lack of exercise,  probably not taking enough insulin for certain meals in the evening and may be giving herself inadequate insulin at breakfast which now appears to be a large meal Although she reports tendency to hypoglycemia before supper again this is not documented She has only one low sugar after dinner  RENAL dysfunction: Last creatinine 1.46 and will recheck it again  Hypertension: Followed by PCP Currently only on low-dose metoprolol and blood pressure is high normal, will defer management to PCP  NEUROPATHY: She is complaining of pains in her feet and legs especially overnight  likely to be from neuropathy  PLAN:    Discussed need for more consistent monitoring at various times, also discussed possibility of using the freestyle Elenor Legato if covered by insurance  Discussed in detail the use of the V-go pump, benefits, day-to-day management with this and given her patient information on this  She does appear to be interested in trying this for overall better control, better management of postprandial readings and convenience in mealtime bolusing as well as improved compliance with boluses  She will be scheduled to see the nurse educator, will also need some basic diabetes education including review of meal planning  She will try to be more consistent with avoiding high-fat meals especially eating out  Meanwhile most likely needs more insulin for covering breakfast, this will be decided based on postprandial reading and will also check her glucose today in the lab  Restart regular exercise with walking when able to  Lyrica 50 mg at bedtime to start with and may take it was a day if needed  Recheck TSH today   There are no Patient Instructions on file for this visit.    Counseling time on subjects discussed above is over 50% of today's 25 minute visit  Tushar Enns 08/28/2016, 5:33 PM   Note: This office note was prepared with Dragon voice recognition system technology. Any transcriptional errors  that result from this process are unintentional.

## 2016-08-25 NOTE — Telephone Encounter (Signed)
Patient calling because PA is required for pregabalin (LYRICA) 50 MG capsule script she was given today.   [place], [place] - [phone]  Thank you,  LL

## 2016-08-28 NOTE — Progress Notes (Signed)
Please call to let patient know that the kidney test is looking better, blood sugar was 205 indicating inadequate insulin to cover breakfast, thyroid dose appears adequate

## 2016-08-28 NOTE — Progress Notes (Signed)
Cardiology Office Note   Date:  08/30/2016   ID:  CHARELLE Oconnell, DOB 1966-01-31, MRN [id number]  PCP:  Alycia Rossetti, MD  Cardiologist:   Minus Breeding, MD  Referring:  Alycia Rossetti, MD  Chief Complaint  Patient presents with  . Chest Pain      History of Present Illness: Kathryn Oconnell is a 51 y.o. female who is referred by Alycia Rossetti, MD for evaluation of hypertension and tachycardia.  She has significant cardiovascular risk factors. She did have apparently a perfusion study a few years ago at [facility] although I don't have these results. She said it was normal. She had some chest discomfort that time. She does have significant stress with her mother dying 2 weeks ago after chronic illness. She works in [employer], academic as an Corporate treasurer. She's not taking care of herself significantly over the years. She does do some walking at work. This does not bring on any cardiovascular symptoms although she does get somewhat chest pain under her left breast. This is sharp and episodic. It goes away spontaneously. She does not describe associated symptoms. She does not have shortness of breath, PND or orthopnea. She notices her heart racing sometimes at night when she goes to bed and her heart rate might feel 100-104. However, she doesn't have any irregular rhythms. She does not have any presyncope or syncope. She takes Ambien) and symptoms resolved.  Past Medical History:  Diagnosis Date  . Anxiety   . Chronic kidney disease   . COPD (chronic obstructive pulmonary disease) ([place])   . Depression   . Diabetes mellitus without complication ([place])   . Eczema   . Graves disease   . HSV-2 infection   . Hyperlipidemia   . Hypertension   . Hypothyroid   . Lung nodule   . Menopausal vaginal dryness 02/24/2014  . Migraines   . Neuropathy   . Tobacco use   . Trouble in sleeping 06/25/2015    Past Surgical History:  Procedure Laterality Date  . CESAREAN SECTION     x 2  . FOOT  SURGERY     5th digit left foot / Great toe, 2ND TOE  . THYROID SURGERY       Current Outpatient Prescriptions  Medication Sig Dispense Refill  . amLODipine (NORVASC) 10 MG tablet Take 0.5 tablets (5 mg total) by mouth daily. (Patient taking differently: Take 5 mg by mouth daily. PRN) 45 tablet 1  . aspirin 81 MG tablet Take 81 mg by mouth daily.    Marland Kitchen atorvastatin (LIPITOR) 20 MG tablet Take 1 tablet (20 mg total) by mouth daily. 90 tablet 0  . cyclobenzaprine (FLEXERIL) 10 MG tablet TAKE 1 TABLET BY MOUTH THREE TIMES DAILY AS NEEDED 60 tablet 1  . fluticasone (FLONASE) 50 MCG/ACT nasal spray Place 2 sprays into the nose daily. For sinuses/allergies 16 g 3  . HYDROcodone-acetaminophen (NORCO) 10-325 MG tablet Take 1 tablet by mouth every 6 (six) hours as needed. 120 tablet 0  . insulin aspart (NOVOLOG FLEXPEN) 100 UNIT/ML FlexPen Inject 5 to 60 units twice daily with meals as needed per sliding scale. (Patient taking differently: 5 Units 3 (three) times daily with meals. Taking 5 units 2 times daily) 15 mL 11  . Insulin Disposable Pump (V-GO 20) KIT 20 Units by Does not apply route 3 (three) times daily before meals.    Marland Kitchen levothyroxine (SYNTHROID, LEVOTHROID) 100 MCG tablet Take 1 tablet (100 mcg  total) by mouth daily. 90 tablet 3  . LORazepam (ATIVAN) 1 MG tablet Take 1 tablet (1 mg total) by mouth 3 (three) times daily as needed for anxiety. 90 tablet 1  . medroxyPROGESTERone (PROVERA) 2.5 MG tablet Take 1 tablet (2.5 mg total) by mouth daily. 30 tablet 11  . metoprolol tartrate (LOPRESSOR) 25 MG tablet Take 1 tablet (25 mg total) by mouth 2 (two) times daily. 60 tablet 3  . mupirocin nasal ointment (BACTROBAN NASAL) 2 % Apply to nares twice a day 10 g 0  . valACYclovir (VALTREX) 1000 MG tablet Take 1 tablet (1,000 mg total) by mouth daily. 30 tablet 12  . VICTOZA 18 MG/3ML SOPN INJECT 1.2 MGS SUBCUTANEOUSLY ONCE DAILY AT THE SAME TIME 6 pen 3  . zolpidem (AMBIEN CR) 12.5 MG CR tablet TAKE  ONE TABLET BY MOUTH AT BEDTIME AS NEEDED FOR SLEEP 30 tablet 2  . Blood Glucose Monitoring Suppl (CONTOUR NEXT EZ MONITOR) w/Device KIT Use to monitor FSBS 2x daily. Dx: E11.9. 1 kit 1  . glucose blood (BAYER CONTOUR NEXT TEST) test strip Use to monitor FSBS 2x daily. Dx: E11.9. 100 each 12  . ONE TOUCH LANCETS MISC Use to monitor FSBS 1x daily. Dx: E11.9. 100 each 1  . pregabalin (LYRICA) 50 MG capsule Take 1 capsule (50 mg total) by mouth 2 (two) times daily. (Patient not taking: Reported on 08/30/2016) 60 capsule 1  . RELION PEN NEEDLES 31G X 6 MM MISC USE AS DIRECTED 100 each 1   No current facility-administered medications for this visit.     Allergies:   Erythromycin; Imitrex [sumatriptan]; Sulfa antibiotics; and Topamax [topiramate]    Social History:  The patient  reports that she has been smoking Cigarettes.  She has a 17.50 pack-year smoking history. She has never used smokeless tobacco. She reports that she does not drink alcohol or use drugs.   Family History:  The patient's family history includes Asthma in her daughter; Cancer in her father, mother, and paternal grandmother; Diabetes in her paternal grandfather; Gout in her brother; Heart attack in her maternal grandfather and maternal grandmother; Heart attack (age of onset: 27) in her father; Heart disease in her maternal grandfather and maternal grandmother; Hyperlipidemia in her mother; Hypertension in her father and paternal uncle; Migraines in her son; Stroke in her mother; Thyroid disease in her mother.    ROS:  Please see the history of present illness.   Otherwise, review of systems are positive for headaches, occasional reflux, constipation..   All other systems are reviewed and negative.    PHYSICAL EXAM: VS:  BP 138/78   Pulse 88   Ht _0  (1.6 m)   Wt 154 lb (69.9 kg)   LMP 06/18/2013 Comment: irregular  BMI 27.28 kg/m  , BMI Body mass index is 27.28 kg/m. GENERAL:  Well appearing HEENT:  Pupils equal round  and reactive, fundi not visualized, oral mucosa unremarkable NECK:  No jugular venous distention, waveform within normal limits, carotid upstroke brisk and symmetric, no bruits, no thyromegaly LYMPHATICS:  No cervical, inguinal adenopathy LUNGS:  Clear to auscultation bilaterally BACK:  No CVA tenderness CHEST:  Unremarkable HEART:  PMI not displaced or sustained,S1 and S2 within normal limits, no S3, no S4, no clicks, no rubs, no murmurs ABD:  Flat, positive bowel sounds normal in frequency in pitch, no bruits, no rebound, no guarding, no midline pulsatile mass, no hepatomegaly, no splenomegaly EXT:  2 plus pulses throughout, no edema, no  cyanosis no clubbing SKIN:  No rashes no nodules NEURO:  Cranial nerves II through XII grossly intact, motor grossly intact throughout PSYCH:  Cognitively intact, oriented to person place and time    EKG:  EKG is ordered today. The ekg ordered today demonstrates sinus rhythm, rate 88, axis within normal limits, intervals within normal limits, no acute ST-T wave changes, right atrial enlargement, possible left atrial enlargement   Recent Labs: 05/10/2016: Hemoglobin 13.2; Platelets 257 08/25/2016: ALT 31; BUN 16; Creatinine, Ser 1.29; Potassium 4.7; Sodium 136; TSH 0.68    Lipid Panel    Component Value Date/Time   CHOL 148 02/08/2016 1435   TRIG 68 02/08/2016 1435   HDL 41 (L) 02/08/2016 1435   CHOLHDL 3.6 02/08/2016 1435   VLDL 14 02/08/2016 1435   LDLCALC 93 02/08/2016 1435   LDLDIRECT 95 10/04/2015 1427      Wt Readings from Last 3 Encounters:  08/30/16 154 lb (69.9 kg)  08/25/16 151 lb 6.4 oz (68.7 kg)  08/15/16 153 lb (69.4 kg)      Other studies Reviewed: Additional studies/ records that were reviewed today include: None. Review of the above records demonstrates:  Please see elsewhere in the note.     ASSESSMENT AND PLAN:  DM:  On insulin.  A1C was 7.6 which is up from previous.  We talked about diet and exercise.    TACHYCARDIA:  This is mild.  At this point she would like to consider conservative therapy and lifestyle changes as she is not particularly symptomatic. I agree with this.  DEPRESSION:  We had a long talk about this and she is going to see a specialist for this.  CKD II:   We discussed this and risk reduction.  She will have this followed by Alycia Rossetti, MD  CHEST PAIN:  This is atypical.  However, she has significant risk factors.  I will bring the patient back for a POET (Plain Old Exercise Test). This will allow me to screen for obstructive coronary disease, risk stratify and very importantly provide a prescription for exercise.  HTN:  Her blood pressure is slightly above target were going to treat this with therapeutic lifestyle changes to exercise.  TOBACCO ABUSE:  We talked about strategies for stopping smoking.   Current medicines are reviewed at length with the patient today.  The patient does not have concerns regarding medicines.  The following changes have been made:  no change  Labs/ tests ordered today include:   Orders Placed This Encounter  Procedures  . Exercise Tolerance Test  . EKG 12-Lead     Disposition:   FU with me in six months.     Signed, Minus Breeding, MD  08/30/2016 11:23 AM    [facility]

## 2016-08-29 ENCOUNTER — Encounter: Payer: BLUE CROSS/BLUE SHIELD | Attending: Endocrinology | Admitting: Nutrition

## 2016-08-29 DIAGNOSIS — Z713 Dietary counseling and surveillance: Secondary | ICD-10-CM | POA: Diagnosis not present

## 2016-08-29 DIAGNOSIS — E1165 Type 2 diabetes mellitus with hyperglycemia: Secondary | ICD-10-CM

## 2016-08-29 DIAGNOSIS — Z794 Long term (current) use of insulin: Secondary | ICD-10-CM | POA: Diagnosis not present

## 2016-08-29 DIAGNOSIS — E119 Type 2 diabetes mellitus without complications: Secondary | ICD-10-CM | POA: Diagnosis not present

## 2016-08-29 NOTE — Patient Instructions (Signed)
Fill and apply a new V-Go 20 every day at the same time. Stop all lantus insulin Give 3 button presses before meals, and 1 button press before snacks at bedtime, or afternoon. Test blood sugars before meals and at bedtime. Always have protein with meals. Stop sweetened drinks.

## 2016-08-29 NOTE — Progress Notes (Signed)
Pt was trained on how to fill, apply and use the V-go 20. She did not take her Lantus this AM, so she filled and apply the V-go 20 with Humalog insulin and applied it to her upper right abdominal area without difficulty.   She only eats 2 meals: Bfast:  Sausage or steak biscuit,  Dinner:  Last night, large bowl of greens and corn on the cob.  Regular soda: 1/2 cup.  She gave herself 7u due to large meal, and dropped low 2 hours after supper. We discussed what a large meal meals:  "a lot of carbs", and the need to have protein with every meal.   We reviewed what carbs are and she reported good understanding of this.   She is taking 5u ac meals.   Says blood sugars are between 160-190 acL daily. Written instructions were given for 3 button presses before breakfast and supper.   She has gained weight due to her mother passing away 3 weeks ago, and is trying to cut down now.  She is also starting back on her treadmill after supper.  She was told to reduce the number of button presses to 2 before supper when exercising after supper She was reminded to stop all lantus, and she reported good understanding of this. She was given a sheet to record blood sugars before meals and at bedtime-which she agreed to do. She will see Dr. Dwyane Dee in 7 days.  2 extra v-go 20s were given to her.  She had no final questions.

## 2016-08-30 ENCOUNTER — Ambulatory Visit (INDEPENDENT_AMBULATORY_CARE_PROVIDER_SITE_OTHER): Payer: BLUE CROSS/BLUE SHIELD | Admitting: Cardiology

## 2016-08-30 ENCOUNTER — Telehealth: Payer: Self-pay | Admitting: Endocrinology

## 2016-08-30 ENCOUNTER — Other Ambulatory Visit: Payer: Self-pay

## 2016-08-30 ENCOUNTER — Encounter: Payer: Self-pay | Admitting: Cardiology

## 2016-08-30 ENCOUNTER — Other Ambulatory Visit: Payer: Self-pay | Admitting: Family Medicine

## 2016-08-30 VITALS — BP 138/78 | HR 88 | Ht 63.0 in | Wt 154.0 lb

## 2016-08-30 DIAGNOSIS — F329 Major depressive disorder, single episode, unspecified: Secondary | ICD-10-CM

## 2016-08-30 DIAGNOSIS — E118 Type 2 diabetes mellitus with unspecified complications: Secondary | ICD-10-CM

## 2016-08-30 DIAGNOSIS — I10 Essential (primary) hypertension: Secondary | ICD-10-CM | POA: Diagnosis not present

## 2016-08-30 DIAGNOSIS — R000 Tachycardia, unspecified: Secondary | ICD-10-CM

## 2016-08-30 DIAGNOSIS — R079 Chest pain, unspecified: Secondary | ICD-10-CM | POA: Diagnosis not present

## 2016-08-30 DIAGNOSIS — Z720 Tobacco use: Secondary | ICD-10-CM | POA: Diagnosis not present

## 2016-08-30 DIAGNOSIS — Z1231 Encounter for screening mammogram for malignant neoplasm of breast: Secondary | ICD-10-CM

## 2016-08-30 DIAGNOSIS — F32A Depression, unspecified: Secondary | ICD-10-CM

## 2016-08-30 NOTE — Telephone Encounter (Signed)
Linda from [facility] called to advise that the patient needs V-go with insulin vials sent to the [place], [place] [address] [place] [phone] (Phone) [phone] (Fax)   Please call to advise if necessary

## 2016-08-30 NOTE — Patient Instructions (Addendum)
Medication Instructions:  The current medical regimen is effective;  continue present plan and medications.  Testing/Procedures: Your physician has requested that you have an exercise tolerance test at [place] at the main entrance of [facility] at 9 am.  Please also follow instruction sheet, as given.  Follow-Up: Follow up in 6 months with Dr. Percival Spanish.  You will receive a letter in the mail 2 months before you are due.  Please call us when you receive this letter to schedule your follow up appointment.   Thank you for choosing [facility]!!

## 2016-08-30 NOTE — Telephone Encounter (Signed)
Needs a pre-authorization for the pregabalin (LYRICA) 50 MG capsule. Please send to  [place], [place] [address] [phone] (Phone) [phone] (Fax)   Please contact patient once this has been done.

## 2016-08-31 ENCOUNTER — Telehealth: Payer: Self-pay | Admitting: *Deleted

## 2016-08-31 ENCOUNTER — Other Ambulatory Visit: Payer: Self-pay

## 2016-08-31 MED ORDER — INSULIN ASPART 100 UNIT/ML ~~LOC~~ SOLN: SUBCUTANEOUS | 3 refills | Status: DC

## 2016-08-31 MED ORDER — V-GO 20 KIT: PACK | 2 refills | Status: DC

## 2016-08-31 NOTE — Telephone Encounter (Signed)
Called patient and let her know that I sent her script in to the [facility] in [place].

## 2016-08-31 NOTE — Telephone Encounter (Signed)
She will need to 20 units basal V-go pump, please send this with the insulin, 56 units per day

## 2016-08-31 NOTE — Telephone Encounter (Signed)
Received call from patient.   Reports that she is needing to take some time off work due to anxiety/ depression. Reports that she has FMLA that covers up to 4 episodes per month. States that she will be sending short term disability forms to office for completion.   Requested letter to give work until STD forms have been completed.   MD please advise.

## 2016-09-01 ENCOUNTER — Other Ambulatory Visit: Payer: Self-pay | Admitting: Family Medicine

## 2016-09-01 NOTE — Telephone Encounter (Signed)
Noted, for now will take out for 6 weeks and re-evaluate

## 2016-09-01 NOTE — Telephone Encounter (Signed)
okay

## 2016-09-01 NOTE — Telephone Encounter (Signed)
Medication called to pharmacy. 

## 2016-09-01 NOTE — Telephone Encounter (Signed)
Last Ov 5/15 Last refill 4/2 Ok to refill?

## 2016-09-01 NOTE — Telephone Encounter (Signed)
Call placed to patient.   States that has been out of work since 08/29/2016. States that she has not determined the amount of time she will require off.   She has an appointment scheduled with [facility] in [place] on 09/06/2016.

## 2016-09-01 NOTE — Telephone Encounter (Signed)
Called [employer] to rush PA for patient. Also called patient to advise that she can try over the weekend to fill her Lyrica if approved. If not, to please call back on Monday to contact [place] for further clarification if needed.

## 2016-09-01 NOTE — Telephone Encounter (Signed)
I need mores specifics on time, when is she planning to stop working and for what period of time. I have to put all these details on the forms. Also see when her therapy/psych appointment is, I have to list this on the paperwork as well

## 2016-09-07 ENCOUNTER — Ambulatory Visit: Payer: Self-pay | Admitting: Endocrinology

## 2016-09-07 ENCOUNTER — Telehealth (HOSPITAL_COMMUNITY): Payer: Self-pay | Admitting: *Deleted

## 2016-09-07 ENCOUNTER — Telehealth: Payer: Self-pay | Admitting: *Deleted

## 2016-09-07 NOTE — Telephone Encounter (Signed)
Received fax from the [employer] for TD forms. 1- [phone]~telephone/ [phone]~ fax.  Call placed to patient for more information.   Job title: LPN, [employer]   Reason STD requested: anxiety/ depression  Requested Beginning Date: 08/29/2016 Return to Work Date: per notes, 6 weeks   Verbalized that fee may be charged and is per provider prerogative.   Forms routed to provider.

## 2016-09-07 NOTE — Telephone Encounter (Signed)
phone call today, spoke with patient.   She said she already saw someone today and yesterday.

## 2016-09-08 ENCOUNTER — Ambulatory Visit (HOSPITAL_COMMUNITY)
Admission: RE | Admit: 2016-09-08 | Discharge: 2016-09-08 | Disposition: A | Discharge status: Home / Self Care | Payer: BLUE CROSS/BLUE SHIELD | Source: Ambulatory Visit | Attending: Family Medicine | Admitting: Family Medicine

## 2016-09-08 ENCOUNTER — Ambulatory Visit (HOSPITAL_COMMUNITY)
Admission: RE | Admit: 2016-09-08 | Discharge: 2016-09-08 | Disposition: A | Discharge status: Home / Self Care | Payer: BLUE CROSS/BLUE SHIELD | Source: Ambulatory Visit | Attending: Cardiology | Admitting: Cardiology

## 2016-09-08 DIAGNOSIS — I10 Essential (primary) hypertension: Secondary | ICD-10-CM | POA: Diagnosis not present

## 2016-09-08 DIAGNOSIS — Z1231 Encounter for screening mammogram for malignant neoplasm of breast: Secondary | ICD-10-CM | POA: Diagnosis not present

## 2016-09-08 DIAGNOSIS — R079 Chest pain, unspecified: Secondary | ICD-10-CM | POA: Diagnosis not present

## 2016-09-08 DIAGNOSIS — R000 Tachycardia, unspecified: Secondary | ICD-10-CM | POA: Diagnosis not present

## 2016-09-08 LAB — EXERCISE TOLERANCE TEST
CHL CUP MPHR: 169 {beats}/min
CSEPED: 8 min
Estimated workload: 10.1 METS
Exercise duration (sec): 30 s
Peak HR: 148 {beats}/min
Percent HR: 87 %
RPE: 12
Rest HR: 73 {beats}/min

## 2016-09-08 NOTE — Telephone Encounter (Signed)
Okay to give letter, starting May 29th , tentative return to work July 9th

## 2016-09-08 NOTE — Telephone Encounter (Signed)
Received call from patient.   States that she is having some difficulty with her job. States that they are requesting a letter from MD with tentative release day.   Ok to write letter for 6 week period?

## 2016-09-08 NOTE — Telephone Encounter (Signed)
Received completed STD forms from provider.   No charge per provider. Patient contacted to make aware.    Faxed to the [employer].

## 2016-09-08 NOTE — Telephone Encounter (Signed)
Letter written.   Call placed to patient and patient made aware.

## 2016-09-13 ENCOUNTER — Other Ambulatory Visit: Payer: Self-pay

## 2016-09-13 ENCOUNTER — Telehealth: Payer: Self-pay

## 2016-09-13 MED ORDER — DULOXETINE HCL 30 MG PO CPEP: 30.0000 mg | ORAL_CAPSULE | Freq: Every day | ORAL | 3 refills | Status: DC

## 2016-09-13 NOTE — Telephone Encounter (Signed)
Spoke with the patient and informed her of the denial we received for Lyrica- told patient that the alternative is duloxetine 30 mg once daily and she agreed to try it- I sent in new prescription for this to her preferred pharmacy

## 2016-09-14 ENCOUNTER — Ambulatory Visit: Payer: Self-pay | Admitting: Endocrinology

## 2016-09-19 ENCOUNTER — Other Ambulatory Visit: Payer: BLUE CROSS/BLUE SHIELD | Admitting: Adult Health

## 2016-09-22 ENCOUNTER — Encounter: Payer: Self-pay | Admitting: Family Medicine

## 2016-09-22 ENCOUNTER — Other Ambulatory Visit: Payer: Self-pay | Admitting: Family Medicine

## 2016-09-22 MED ORDER — HYDROCODONE-ACETAMINOPHEN 10-325 MG PO TABS: 1.0000 | ORAL_TABLET | Freq: Four times a day (QID) | ORAL | 0 refills | Status: DC | PRN

## 2016-09-22 NOTE — Telephone Encounter (Signed)
Reviewed database Okay to refill

## 2016-09-22 NOTE — Telephone Encounter (Signed)
Patient is calling to get rx for her hydrocodone

## 2016-09-22 NOTE — Telephone Encounter (Signed)
Last OV 5/15 Last refill 5/15 Ok to refill?

## 2016-09-22 NOTE — Telephone Encounter (Signed)
Pt can pick up after 4 today. Pt aware

## 2016-09-28 ENCOUNTER — Ambulatory Visit (INDEPENDENT_AMBULATORY_CARE_PROVIDER_SITE_OTHER): Payer: BLUE CROSS/BLUE SHIELD | Admitting: Adult Health

## 2016-09-28 ENCOUNTER — Encounter: Payer: Self-pay | Admitting: Adult Health

## 2016-09-28 ENCOUNTER — Other Ambulatory Visit (HOSPITAL_COMMUNITY)
Admission: RE | Admit: 2016-09-28 | Discharge: 2016-09-28 | Disposition: A | Discharge status: Home / Self Care | Payer: BLUE CROSS/BLUE SHIELD | Source: Ambulatory Visit | Attending: Adult Health | Admitting: Adult Health

## 2016-09-28 ENCOUNTER — Other Ambulatory Visit: Payer: Self-pay | Admitting: Family Medicine

## 2016-09-28 VITALS — BP 130/82 | HR 76 | Ht 64.5 in | Wt 154.0 lb

## 2016-09-28 DIAGNOSIS — Z1211 Encounter for screening for malignant neoplasm of colon: Secondary | ICD-10-CM

## 2016-09-28 DIAGNOSIS — B379 Candidiasis, unspecified: Secondary | ICD-10-CM | POA: Diagnosis not present

## 2016-09-28 DIAGNOSIS — Z01419 Encounter for gynecological examination (general) (routine) without abnormal findings: Secondary | ICD-10-CM | POA: Insufficient documentation

## 2016-09-28 DIAGNOSIS — Z1212 Encounter for screening for malignant neoplasm of rectum: Secondary | ICD-10-CM | POA: Diagnosis not present

## 2016-09-28 DIAGNOSIS — N951 Menopausal and female climacteric states: Secondary | ICD-10-CM

## 2016-09-28 LAB — HEMOCCULT GUIAC POC 1CARD (OFFICE): Fecal Occult Blood, POC: NEGATIVE

## 2016-09-28 NOTE — Progress Notes (Signed)
Patient ID: Kathryn Oconnell, female   DOB: 04-21-1965, 51 y.o.   MRN: [id number] History of Present Illness: Kathryn Oconnell is a 51 year old black female in for a well woman gyn exam and pap. PCP is Dr Buelah Manis.    Current Medications, Allergies, Past Medical History, Past Surgical History, Family History and Social History were reviewed in [facility] record.     Review of Systems:  Patient denies any headaches, hearing loss, fatigue, blurred vision, shortness of breath, chest pain, abdominal pain, problems with bowel movements, urination, or intercourse(not having sex). No joint pain or mood swings.She tried to take provera for 10 days but vagina burns so takes it daily, no bleeding.    Physical Exam:BP 130/82 (BP Location: Right Arm, Patient Position: Sitting, Cuff Size: Normal)   Pulse 76   Ht 5' 4.5" (1.638 m)   Wt 154 lb (69.9 kg)   LMP 06/18/2013 Comment: irregular  BMI 26.03 kg/m  General:  Well developed, well nourished, no acute distress Skin:  Warm and dry Neck:  Midline trachea, normal thyroid, good ROM, no lymphadenopathy Lungs; Clear to auscultation bilaterally Breast:  No dominant palpable mass, retraction, or nipple discharge Cardiovascular: Regular rate and rhythm Abdomen:  Soft, non tender, no hepatosplenomegaly Pelvic:  External genitalia is normal in appearance, no lesions.  The vagina is pale with loss of moisture and rugae. Urethra has no lesions or masses. The cervix is smooth, pap with HPV performed at pt request.  Uterus is felt to be normal size, shape, and contour.  No adnexal masses or tenderness noted.Bladder is non tender, no masses felt. Rectal: Good sphincter tone, no polyps, or hemorrhoids felt.  Hemoccult negative. Extremities/musculoskeletal:  No swelling or varicosities noted, no clubbing or cyanosis Psych:  No mood changes, alert and cooperative,seems happy PHQ 9 score 6, denies suicidal, going to therapy at [facility], and is on lexapro.  Mom died last month.   Impression:  1. Encounter for gynecological examination with Papanicolaou smear of cervix   2. Vaginal dryness, menopausal   3. Screening for colorectal cancer      Plan: Physical in 1 year, pap in 3 if normal(but I am sure she will want one next year) Mammogram yearly Labs with PCP  Has refills on provera

## 2016-09-28 NOTE — Addendum Note (Signed)
Addended by: Gaylyn Rong A on: 09/28/2016 04:02 PM   Modules accepted: Orders

## 2016-10-03 ENCOUNTER — Ambulatory Visit (INDEPENDENT_AMBULATORY_CARE_PROVIDER_SITE_OTHER): Payer: BLUE CROSS/BLUE SHIELD | Admitting: Family Medicine

## 2016-10-03 ENCOUNTER — Telehealth: Payer: Self-pay | Admitting: Adult Health

## 2016-10-03 ENCOUNTER — Encounter: Payer: Self-pay | Admitting: Family Medicine

## 2016-10-03 VITALS — BP 130/78 | HR 80 | Temp 98.5°F | Resp 14 | Ht 64.5 in | Wt 153.0 lb

## 2016-10-03 DIAGNOSIS — F331 Major depressive disorder, recurrent, moderate: Secondary | ICD-10-CM | POA: Diagnosis not present

## 2016-10-03 DIAGNOSIS — F419 Anxiety disorder, unspecified: Secondary | ICD-10-CM

## 2016-10-03 LAB — CYTOLOGY - PAP
ADEQUACY: ABSENT
DIAGNOSIS: NEGATIVE
HPV (WINDOPATH): NOT DETECTED

## 2016-10-03 MED ORDER — HYDROCODONE-ACETAMINOPHEN 10-325 MG PO TABS: 1.0000 | ORAL_TABLET | Freq: Four times a day (QID) | ORAL | 0 refills | Status: DC | PRN

## 2016-10-03 MED ORDER — CYCLOBENZAPRINE HCL 10 MG PO TABS: 10.0000 mg | ORAL_TABLET | Freq: Three times a day (TID) | ORAL | 2 refills | Status: DC | PRN

## 2016-10-03 NOTE — Patient Instructions (Signed)
F/U  As previous in Sept

## 2016-10-03 NOTE — Assessment & Plan Note (Signed)
Very difficult time for her with her mother passing and then the anniversary of her father's during this time of grief. I think that she is getting some benefit from psychotherapy no titrating her medications therefore she doesn't need to continue to stay out of work at this time. I do not see any evidence of self-harm or suicidal ideations. At this time her tentative return to work date is 12/01/2016. I will continue to prescribe her lorazepam  She will follow-up with her in her ear nose and throat for her chronic sinus and ear problems.

## 2016-10-03 NOTE — Telephone Encounter (Signed)
Left message that pap negative for malignancy and HPV but has yeast, if any itching can use OTC monistat, if no itching, then  do not have  to use anything.

## 2016-10-03 NOTE — Progress Notes (Signed)
Subjective:    Patient ID: Kathryn Oconnell, female    DOB: 1966/03/22, 51 y.o.   MRN: [id number]  Patient presents for 6 week F/U (is fasting)  Patient here for interim follow-up for her medical leave. She's currently medically secondary to worsening depression and anxiety. Her mother passed away she was previously in hospice back in May. She's been seeking care of her psychiatrist and a therapist [facility]. She is currently on Lexapro 10 mg has a follow-up this week with a plan to titrate medication. She is also still on the lorazepam. She has good days and bad days however yesterday was the fiber and a fresh of her father's death so therefore thinking of both parents have passed away this may yesterday and today difficult. She is afraid that other close level once many become ill or diet as well. She's tried to take it in day at a time. She has noticed some improvement in her overall anxiety and stress levels with her break from her work. This has given her time to grieve as well as handle a lot of personal issues. Her psychiatrist has distended harm medical leave to 12/01/2016.  Her last visit she was seen by cardiology she had a negative stress test she was continued on the beta blocker for her palpitations. She also has an upcoming point in with ear nose and throat due to continued ear pain and nasal congestion they're considering a procedure to decrease the size of her turbinates. She is also following with her endocrinologist. Podiatrist told her that she has a large bone spur that we'll need operation once she is cleared by her endocrinologist for her diabetes  She did request her prescription for July for her pain medication to put on file. Review Of Systems:  GEN- denies fatigue, fever, weight loss,weakness, recent illness HEENT- denies eye drainage, change in vision, nasal discharge, CVS- denies chest pain, palpitations RESP- denies SOB, cough, wheeze ABD- denies N/V, change in  stools, abd pain GU- denies dysuria, hematuria, dribbling, incontinence MSK- + joint pain, muscle aches, injury Neuro- denies headache, dizziness, syncope, seizure activity       Objective:    BP 130/78   Pulse 80   Temp 98.5 F (36.9 C) (Oral)   Resp 14   Ht 5' 4.5" (1.638 m)   Wt 153 lb (69.4 kg)   LMP 06/18/2013 Comment: irregular  SpO2 99%   BMI 25.86 kg/m  GEN- NAD, alert and oriented x3 HEENT- PERRL, EOMI, non injected sclera, pink conjunctiva, MMM, oropharynx clear, TM Clear no effusion, nares clear enlarged turbinates Neck- Supple, no LAD Psych- stressed appearing, not anxious, well groomed, no SI, normal speech        Assessment & Plan:      Problem List Items Addressed This Visit    Anxiety   Depression - Primary    Very difficult time for her with her mother passing and then the anniversary of her father's during this time of grief. I think that she is getting some benefit from psychotherapy no titrating her medications therefore she doesn't need to continue to stay out of work at this time. I do not see any evidence of self-harm or suicidal ideations. At this time her tentative return to work date is 12/01/2016. I will continue to prescribe her lorazepam  She will follow-up with her in her ear nose and throat for her chronic sinus and ear problems.  Note: This dictation was prepared with Dragon dictation along with smaller phrase technology. Any transcriptional errors that result from this process are unintentional.

## 2016-10-13 ENCOUNTER — Telehealth: Payer: Self-pay | Admitting: *Deleted

## 2016-10-13 NOTE — Telephone Encounter (Signed)
Received call from patient.   Reports that she has been seen at psych and has been advised to try for disability.   Would like provider recommendations.

## 2016-10-16 NOTE — Telephone Encounter (Signed)
I don't think she can get full disability through the state and I wouldn't recommend it at this time as she is able to work She can use her short term and long term through her work, but her psychiatrist would need to complete this as this is THEIR recommendation.

## 2016-10-18 NOTE — Telephone Encounter (Signed)
Call placed to patient and patient made aware.  

## 2016-10-18 NOTE — Telephone Encounter (Signed)
Call placed to patient. LMTRC.  

## 2016-10-26 ENCOUNTER — Ambulatory Visit (INDEPENDENT_AMBULATORY_CARE_PROVIDER_SITE_OTHER): Payer: BLUE CROSS/BLUE SHIELD | Admitting: Otolaryngology

## 2016-10-30 ENCOUNTER — Ambulatory Visit (INDEPENDENT_AMBULATORY_CARE_PROVIDER_SITE_OTHER): Payer: Self-pay | Admitting: Otolaryngology

## 2016-10-31 ENCOUNTER — Other Ambulatory Visit: Payer: Self-pay | Admitting: Family Medicine

## 2016-11-01 ENCOUNTER — Encounter: Payer: Self-pay | Admitting: Family Medicine

## 2016-11-01 NOTE — Telephone Encounter (Signed)
Last OV 10/03/2016 Last refill 09/01/2016 Ok to refill?

## 2016-11-01 NOTE — Telephone Encounter (Signed)
okay

## 2016-11-02 ENCOUNTER — Other Ambulatory Visit: Payer: Self-pay | Admitting: *Deleted

## 2016-11-03 ENCOUNTER — Encounter: Payer: Self-pay | Admitting: Family Medicine

## 2016-11-03 MED ORDER — LORAZEPAM 1 MG PO TABS: 1.0000 mg | ORAL_TABLET | Freq: Three times a day (TID) | ORAL | 1 refills | Status: DC | PRN

## 2016-11-03 NOTE — Telephone Encounter (Signed)
Rx called into Pharmacy 

## 2016-11-22 ENCOUNTER — Other Ambulatory Visit: Payer: Self-pay | Admitting: Family Medicine

## 2016-11-22 MED ORDER — HYDROCODONE-ACETAMINOPHEN 10-325 MG PO TABS: 1.0000 | ORAL_TABLET | Freq: Four times a day (QID) | ORAL | 0 refills | Status: DC | PRN

## 2016-11-22 MED ORDER — ATORVASTATIN CALCIUM 20 MG PO TABS: 20.0000 mg | ORAL_TABLET | Freq: Every day | ORAL | 0 refills | Status: DC

## 2016-12-01 ENCOUNTER — Telehealth: Payer: Self-pay | Admitting: Endocrinology

## 2016-12-01 NOTE — Telephone Encounter (Signed)
LM for pt to call back to schedule for labs and a visit

## 2016-12-02 ENCOUNTER — Other Ambulatory Visit: Payer: Self-pay | Admitting: Family Medicine

## 2016-12-05 NOTE — Telephone Encounter (Signed)
Refill appropriate 

## 2016-12-09 ENCOUNTER — Other Ambulatory Visit: Payer: Self-pay | Admitting: Family Medicine

## 2016-12-11 NOTE — Telephone Encounter (Signed)
Refill appropriate 

## 2016-12-13 ENCOUNTER — Ambulatory Visit: Payer: Self-pay | Admitting: Endocrinology

## 2016-12-13 DIAGNOSIS — Z0289 Encounter for other administrative examinations: Secondary | ICD-10-CM

## 2016-12-17 NOTE — Progress Notes (Signed)
Patient ID: Kathryn Oconnell, female   DOB: Aug 06, 1965, 51 y.o.   MRN: [id number]           Reason for Appointment: Follow-up for Type 2 Diabetes  Referring physician: Dr. Buelah Manis   History of Present Illness:          Date of diagnosis of type 2 diabetes mellitus: 2000        Background history:   She had been on metformin for a few years but this was stopped in 1/16 when her renal function was impaired. She says she had been doing fairly well with this Trulicity 5.32 mg weekly In 10/16 she had significant hyperglycemia while taking oral hypoglycemic drugs and she was started on insulin She was switched back to oral agents with glipizide and Onglyza in 1/17 However subsequently blood sugars started going up and she was back on insulin in April 2017  Recent history:   INSULIN regimen is: LANTUS 24 in am, Novolog recently 7-10 U with large meals  Non-insulin hypoglycemic drugs the patient is taking are: Victoza 1.2 mg daily  A1c is slightly better at 7.3, previously 7.6 although has been as low as 6.4  She has not been seen in follow-up since 5/18  Current management, blood sugar patterns and problems identified:  She has been depressed significantly since her last visit because of family death and has not done well day-to-day management  However she thinks she is taking her insulin consistently  She is complaining about weight gain from Lantus although she has been on this for quite some time and has no hypoglycemia reported  FASTING blood sugar in the office today is over 180 even with her taking 24 units Lantus  She has not done any monitoring at home  She has taken her insulin consistently as directed before her main meal, usually not in the morning  She thinks she is adjusting NovoLog based on what she is eating especially in the evening  She has been inconsistent with her walking  She has gained weight         Side effects from medications have been:  None  Compliance with the medical regimen: Fair HYPOGLYCEMIA: May feel shaky when symptomatic, does not always have symptoms with low sugar readings  Glucose monitoring:  done 1 times a day         Glucometer:  contour  Blood Glucose readings    by time of day and averages from meter download:  Mean values apply above for all meters except median for One Touch  PRE-MEAL Fasting Lunch Dinner Bedtime Overall  Glucose range:       Mean/median:        POST-MEAL PC Breakfast PC Lunch PC Dinner  Glucose range:     Mean/median:        Mean values apply above for all meters except median for One Touch  PRE-MEAL Fasting Lunch Dinner Bedtime Overall  Glucose range:    64-2 81    Mean/median:    145      Self-care: The diet that the patient has been following is: Reducing fats. Meal times are:  Breakfast is at 10 AM, Lunch: Usually none Dinner: 7-8 PM   Typical meal intake: Breakfast is at 10 am, rarely eating fast food biscuits.  Evening meal is meat or chicken with potatoes, greens, macaroni.  Snacks on chips.  She will drink water and some juice, usually diet drinks  Dietician visit, most recent: 6/17               Exercise: walking 1-2/7 days, about 30 minutes in the evening  Weight history: Previous range 119-196  Wt Readings from Last 3 Encounters:  12/18/16 159 lb (72.1 kg)  09/28/16 154 lb (69.9 kg)  08/30/16 154 lb (69.9 kg)    Glycemic control:   Lab Results  Component Value Date   HGBA1C 7.3 12/18/2016   HGBA1C 7.6 08/25/2016   HGBA1C 6.4 05/25/2016   Lab Results  Component Value Date   MICROALBUR 4.3 (H) 08/25/2016   LDLCALC 93 02/08/2016   CREATININE 1.29 (H) 08/25/2016   Lab Results  Component Value Date   MICRALBCREAT 3.2 08/25/2016    Other active problems discussed today: See review of systems   Allergies as of 12/18/2016      Reactions   Erythromycin Other (See Comments)   Stomach cramps   Imitrex [sumatriptan] Other (See  Comments)   Migraine and increased BP   Sulfa Antibiotics Other (See Comments)   Causes stomach cramps   Topamax [topiramate] Nausea And Vomiting   Nausea and worsening      Medication List       Accurate as of 12/18/16 10:11 AM. Always use your most recent med list.          amLODipine 10 MG tablet Commonly known as:  NORVASC Take 0.5 tablets (5 mg total) by mouth daily.   aspirin 81 MG tablet Take 81 mg by mouth daily.   atorvastatin 20 MG tablet Commonly known as:  LIPITOR Take 1 tablet (20 mg total) by mouth daily.   Azelastine-Fluticasone 137-50 MCG/ACT Susp Place into the nose.   ciclopirox 8 % solution Commonly known as:  PENLAC   CONTOUR NEXT EZ MONITOR w/Device Kit Use to monitor FSBS 2x daily. Dx: E11.9.   cyclobenzaprine 10 MG tablet Commonly known as:  FLEXERIL Take 1 tablet (10 mg total) by mouth 3 (three) times daily as needed.   escitalopram 10 MG tablet Commonly known as:  LEXAPRO Take 10 mg by mouth daily.   fluticasone 50 MCG/ACT nasal spray Commonly known as:  FLONASE Place 2 sprays into the nose daily. For sinuses/allergies   FREESTYLE LIBRE READER Devi 1 Device by Does not apply route as directed.   [place] Misc Apply to upper arm and change sensor every 10 days   glucose blood test strip Commonly known as:  BAYER CONTOUR NEXT TEST Use to monitor FSBS 2x daily. Dx: E11.9.   HYDROcodone-acetaminophen 10-325 MG tablet Commonly known as:  NORCO Take 1 tablet by mouth every 6 (six) hours as needed.   insulin aspart 100 UNIT/ML FlexPen Commonly known as:  NOVOLOG FLEXPEN Inject 5 to 60 units twice daily with meals as needed per sliding scale.   insulin degludec 100 UNIT/ML Sopn FlexTouch Pen Commonly known as:  TRESIBA FLEXTOUCH Inject 0.24 mLs (24 Units total) into the skin daily.   levothyroxine 100 MCG tablet Commonly known as:  SYNTHROID, LEVOTHROID Take 1 tablet (100 mcg total) by mouth daily.    LORazepam 1 MG tablet Commonly known as:  ATIVAN Take 1 tablet (1 mg total) by mouth 3 (three) times daily as needed. for anxiety   medroxyPROGESTERone 2.5 MG tablet Commonly known as:  PROVERA Take 1 tablet (2.5 mg total) by mouth daily.   metoprolol tartrate 25 MG tablet Commonly known as:  LOPRESSOR TAKE 1 TABLET BY MOUTH TWICE DAILY  ONE TOUCH LANCETS Misc Use to monitor FSBS 1x daily. Dx: E11.9.   oxyCODONE-acetaminophen 7.5-325 MG tablet Commonly known as:  PERCOCET   RELION PEN NEEDLES 31G X 6 MM Misc Generic drug:  Insulin Pen Needle USE AS DIRECTED   Semaglutide 0.25 or 0.5 MG/DOSE Sopn Commonly known as:  OZEMPIC Inject 0.5 mg into the skin once a week.   sertraline 100 MG tablet Commonly known as:  ZOLOFT   valACYclovir 1000 MG tablet Commonly known as:  VALTREX Take 1 tablet (1,000 mg total) by mouth daily.   zolpidem 12.5 MG CR tablet Commonly known as:  AMBIEN CR TAKE ONE TABLET BY MOUTH AT BEDTIME AS NEEDED FOR SLEEP            Discharge Care Instructions        Start     Ordered   12/18/16 0000  POCT glycosylated hemoglobin (Hb A1C)     12/18/16 0940   12/18/16 0000  Semaglutide (OZEMPIC) 0.25 or 0.5 MG/DOSE SOPN  Weekly     12/18/16 0940   12/18/16 0000  insulin degludec (TRESIBA FLEXTOUCH) 100 UNIT/ML SOPN FlexTouch Pen  Daily     12/18/16 0940   12/18/16 0000  Continuous Blood Gluc Receiver (FREESTYLE LIBRE READER) DEVI  As directed     12/18/16 0940   12/18/16 0000  Continuous Blood Gluc Sensor (FREESTYLE LIBRE SENSOR SYSTEM) MISC     12/18/16 0940   12/18/16 0000  POCT glucose (manual entry)     12/18/16 0945      Allergies:  Allergies  Allergen Reactions  . Erythromycin Other (See Comments)    Stomach cramps  . Imitrex [Sumatriptan] Other (See Comments)    Migraine and increased BP  . Sulfa Antibiotics Other (See Comments)    Causes stomach cramps  . Topamax [Topiramate] Nausea And Vomiting    Nausea and worsening     Past Medical History:  Diagnosis Date  . Anxiety   . Chronic kidney disease   . COPD (chronic obstructive pulmonary disease) ([place])   . Depression   . Diabetes mellitus without complication ([place])   . Eczema   . Graves disease   . HSV-2 infection   . Hyperlipidemia   . Hypertension   . Hypothyroid   . Lung nodule   . Menopausal vaginal dryness 02/24/2014  . Migraines   . Neuropathy   . Tobacco use   . Trouble in sleeping 06/25/2015    Past Surgical History:  Procedure Laterality Date  . CESAREAN SECTION     x 2  . FOOT SURGERY     5th digit left foot / Great toe, 2ND TOE  . THYROID SURGERY      Family History  Problem Relation Age of Onset  . Stroke Mother        x 3  . Cancer Mother        breast   . Thyroid disease Mother   . Hyperlipidemia Mother   . Cancer Father        stomach cancer   . Hypertension Father   . Heart attack Father 11  . Gout Brother   . Migraines Son   . Hypertension Paternal Uncle   . Heart disease Maternal Grandmother   . Heart attack Maternal Grandmother   . Heart disease Maternal Grandfather   . Heart attack Maternal Grandfather   . Cancer Paternal Grandmother   . Diabetes Paternal Grandfather   . Asthma Daughter  Social History:  reports that she has been smoking Cigarettes.  She has a 17.50 pack-year smoking history. She has never used smokeless tobacco. She reports that she does not drink alcohol or use drugs.   Review of Systems   NEUROPATHY: She was given Lyrica for symptomatic relief  RENAL function abnormalities: She has had slightly high creatinine and this needs follow-up  Lipid history:  on Lipitor 20 mg with adequate control    Lab Results  Component Value Date   CHOL 148 02/08/2016   HDL 41 (L) 02/08/2016   LDLCALC 93 02/08/2016   LDLDIRECT 95 10/04/2015   TRIG 68 02/08/2016   CHOLHDL 3.6 02/08/2016           Hypertension: mild, on metoprolol 12.5 mg twice a day along with amlodipine She has been  followed by PCP    BP Readings from Last 3 Encounters:  12/18/16 116/68  09/28/16 130/82  08/30/16 138/78    Most recent eye exam was In 1/17  Most recent foot exam: 8/17  HYPOTHYROIDISM:  She is on long-term supplementation with levothyroxine The dose was increased from 88 up to 100 g previously and last TSH is normal Difficult to judge her symptoms since she has had some depression also  Lab Results  Component Value Date   TSH 0.68 08/25/2016   TSH 1.33 05/25/2016   TSH 1.16 05/10/2016   FREET4 1.10 08/25/2016   FREET4 0.88 11/17/2015   FREET4 1.17 03/24/2015      Physical Examination:  BP 116/68   Pulse 66   Ht 5' 4"  (1.626 m)   Wt 159 lb (72.1 kg)   LMP 06/18/2013 Comment: irregular  BMI 27.29 kg/m    ASSESSMENT:  Diabetes type 2, BMI 27  See history of present illness for detailed discussion of current diabetes management, blood sugar patterns and problems identified  She is on a regimen of basal bolus insulin and Victoza, Requiring relatively low doses of insulin A1c is now 7.3 but has been better in the past  Difficult to assess her pattern of blood sugars as she is not monitoring blood sugars Most likely has not been consistent with lifestyle and diet because of her depression Discussed that this is likely to be causing her weight gain instead of basal insulin  Appears to have relatively inadequate knowledge about basal and bolus insulins and how they work She is not very motivated to check her blood sugar but is interested in the freestyle [place] system for CGM  RENAL dysfunction: To be followed by PCP   PLAN:    Discussed differences between basal and bolus insulin  Since Tresiba may be more effective 24 hours compared to Lantus and possibly have less weight gain and hypoglycemia will switch her but will start with only 20 units  Given her a flowsheet with detailed instructions on how to titrate the [place] every 3 days to get morning sugars  below 130  She will still take NovoLog with her main meal but will need to consider doing this twice a day  Since she is concerned about weight gain will switch her Victoza to Ozempic  Details instructions on this are also given, since she is already on Victoza she can just take the first dose of 0.25 mg Ozempic and then 0.5 mg weekly, may also consider going to 1 mg on the next visit   Discussed how the freestyle [place] system is used and patient information given, she will check to see if  this is covered at the pharmacy by her insurance  Needs regular follow-up  Start regular exercise with walking  Consider consultation with dietitian for weight management  Patient Instructions  Check blood sugars on waking up  daily while starting the [facility] which replaces the LANTUS  Also check blood sugars about 2 hours after a meal and do this after different meals by rotation  Recommended blood sugar levels on waking up is 90-130 and about 2 hours after meal is 130-160  Please bring your blood sugar monitor to each visit, thank you   Tresiba 20 units 1x daily and adjust every 3-4 days based on morning blood sugar as on the flow sheet  Continue Novolog/blue pen as before with meals  Ozempic replaces Victoza.   Start with the first injection of 0.25 mg and then subsequently on a weekly basis take 0.5 mg daily until the next visit   Counseling time on subjects discussed in assessment and plan sections is over 50% of today's 25 minute visit     Kline Bulthuis 12/18/2016, 10:11 AM   Note: This office note was prepared with Estate agent. Any transcriptional errors that result from this process are unintentional.

## 2016-12-18 ENCOUNTER — Encounter: Payer: Self-pay | Admitting: Family Medicine

## 2016-12-18 ENCOUNTER — Encounter: Payer: Self-pay | Admitting: Endocrinology

## 2016-12-18 ENCOUNTER — Ambulatory Visit (INDEPENDENT_AMBULATORY_CARE_PROVIDER_SITE_OTHER): Payer: BLUE CROSS/BLUE SHIELD | Admitting: Endocrinology

## 2016-12-18 ENCOUNTER — Ambulatory Visit (INDEPENDENT_AMBULATORY_CARE_PROVIDER_SITE_OTHER): Payer: BLUE CROSS/BLUE SHIELD | Admitting: Family Medicine

## 2016-12-18 ENCOUNTER — Other Ambulatory Visit: Payer: Self-pay

## 2016-12-18 VITALS — BP 116/68 | HR 66 | Ht 64.0 in | Wt 159.0 lb

## 2016-12-18 VITALS — BP 128/79 | HR 88 | Temp 98.0°F | Resp 14 | Ht 64.0 in | Wt 160.0 lb

## 2016-12-18 DIAGNOSIS — I10 Essential (primary) hypertension: Secondary | ICD-10-CM

## 2016-12-18 DIAGNOSIS — J410 Simple chronic bronchitis: Secondary | ICD-10-CM

## 2016-12-18 DIAGNOSIS — G894 Chronic pain syndrome: Secondary | ICD-10-CM | POA: Diagnosis not present

## 2016-12-18 DIAGNOSIS — E1165 Type 2 diabetes mellitus with hyperglycemia: Secondary | ICD-10-CM | POA: Diagnosis not present

## 2016-12-18 DIAGNOSIS — E782 Mixed hyperlipidemia: Secondary | ICD-10-CM

## 2016-12-18 DIAGNOSIS — Z794 Long term (current) use of insulin: Secondary | ICD-10-CM | POA: Diagnosis not present

## 2016-12-18 DIAGNOSIS — Z720 Tobacco use: Secondary | ICD-10-CM

## 2016-12-18 DIAGNOSIS — E118 Type 2 diabetes mellitus with unspecified complications: Secondary | ICD-10-CM | POA: Diagnosis not present

## 2016-12-18 LAB — GLUCOSE, POCT (MANUAL RESULT ENTRY): POC GLUCOSE: 182 mg/dL — AB (ref 70–99)

## 2016-12-18 LAB — POCT GLYCOSYLATED HEMOGLOBIN (HGB A1C): Hemoglobin A1C: 7.3

## 2016-12-18 MED ORDER — HYDROCODONE-ACETAMINOPHEN 10-325 MG PO TABS: 1.0000 | ORAL_TABLET | Freq: Four times a day (QID) | ORAL | 0 refills | Status: DC | PRN

## 2016-12-18 MED ORDER — SEMAGLUTIDE(0.25 OR 0.5MG/DOS) 2 MG/1.5ML ~~LOC~~ SOPN: 0.5000 mg | PEN_INJECTOR | SUBCUTANEOUS | 2 refills | Status: DC

## 2016-12-18 MED ORDER — FREESTYLE LIBRE SENSOR SYSTEM MISC: 3 refills | Status: DC

## 2016-12-18 MED ORDER — INSULIN DEGLUDEC 100 UNIT/ML ~~LOC~~ SOPN: 24.0000 [IU] | PEN_INJECTOR | Freq: Every day | SUBCUTANEOUS | 1 refills | Status: DC

## 2016-12-18 MED ORDER — FREESTYLE LIBRE READER DEVI: 1.0000 | 0 refills | Status: DC

## 2016-12-18 NOTE — Assessment & Plan Note (Addendum)
Doing well, continues to smoke Discussed tobacco cessation

## 2016-12-18 NOTE — Patient Instructions (Signed)
We will call with lab results Flu shot given F/U 4 months

## 2016-12-18 NOTE — Assessment & Plan Note (Signed)
On statin drug 

## 2016-12-18 NOTE — Assessment & Plan Note (Signed)
MTF, refilled norco

## 2016-12-18 NOTE — Patient Instructions (Addendum)
Check blood sugars on waking up  daily while starting the [facility] which replaces the LANTUS  Also check blood sugars about 2 hours after a meal and do this after different meals by rotation  Recommended blood sugar levels on waking up is 90-130 and about 2 hours after meal is 130-160  Please bring your blood sugar monitor to each visit, thank you   Tresiba 20 units 1x daily and adjust every 3-4 days based on morning blood sugar as on the flow sheet  Continue Novolog/blue pen as before with meals  Ozempic replaces Victoza.   Start with the first injection of 0.25 mg and then subsequently on a weekly basis take 0.5 mg daily until the next visit

## 2016-12-18 NOTE — Progress Notes (Signed)
   Subjective:    Patient ID: Kathryn Oconnell, female    DOB: Mar 08, 1966, 51 y.o.   MRN: [id number]  Patient presents for 4 month F/U (is not fasting)  Patient here for follow-up. She was seen by her endocrinologist this morning she missed and not checking her blood sugars she's not been eating regularly she is gaining weight she overeats late in the evening and then takes all her medication at one time. On review of her blood pressure medicine she is only taking her metoprolol once a day despite numerous times we have discussed her doses of twice a day medication as well as with the cardiologist that she gets tachycardic episodes. She is still following with her therapist and her psychiatrist she iw Now on zoloft  150mg , off lexpapro , feels better on this. But she missed there is significant stressors at home. Her son is going through some issues with substance abuse and she is now getting him some help. She actually has anxiety about returning to work in a couple of weeks.   He knows that she needs to improve some things before she can return to work and get back on track. Labs are unable to be done at her endocrinologist office is more need to be drawn this afternoon. Her A1c was done via fingerstick was 7.3%      Review Of Systems:  GEN- denies fatigue, fever, weight loss,weakness, recent illness HEENT- denies eye drainage, change in vision, nasal discharge, CVS- denies chest pain, palpitations RESP- denies SOB, cough, wheeze ABD- denies N/V, change in stools, abd pain GU- denies dysuria, hematuria, dribbling, incontinence MSK- denies joint pain, muscle aches, injury Neuro- denies headache, dizziness, syncope, seizure activity       Objective:    BP 128/79   Pulse 88   Temp 98 F (36.7 C) (Oral)   Resp 14   Ht 5\' 4"  (1.626 m)   Wt 160 lb (72.6 kg)   LMP 06/18/2013 Comment: irregular  SpO2 99%   BMI 27.46 kg/m  GEN- NAD, alert and oriented x3 HEENT- PERRL, EOMI, non  injected sclera, pink conjunctiva, MMM, oropharynx clear CVS- RRR, no murmur RESP-CTAB Psych- normal affect and mood EXT- No edema Pulses- Radial, DP- 2+        Assessment & Plan:      Problem List Items Addressed This Visit      Unprioritized   Tobacco use   Hyperlipidemia    On statin drug       Relevant Orders   Lipid panel   Essential hypertension, benign - Primary    BP looks good, but adivsed to take metoprolol twice a day      Relevant Orders   CBC with Differential/Platelet   Comprehensive metabolic panel   Diabetes mellitus ([place])    Fair control, discussed  Eating on a regular basis as well as healthy meals. Endocrinologist has already Set her up with diabetic nutritionist      Relevant Orders   HM DIABETES FOOT EXAM (Completed)   COPD (chronic obstructive pulmonary disease) ([place])    Doing well, continues to smoke Discussed tobacco cessation      Chronic pain syndrome    MTF, refilled norco         Note: This dictation was prepared with Dragon dictation along with smaller phrase technology. Any transcriptional errors that result from this process are unintentional.

## 2016-12-18 NOTE — Assessment & Plan Note (Signed)
BP looks good, but adivsed to take metoprolol twice a day

## 2016-12-18 NOTE — Assessment & Plan Note (Signed)
Fair control, discussed  Eating on a regular basis as well as healthy meals. Endocrinologist has already Set her up with diabetic nutritionist

## 2016-12-19 LAB — COMPREHENSIVE METABOLIC PANEL
AG Ratio: 1.2 (calc) (ref 1.0–2.5)
ALT: 31 U/L — ABNORMAL HIGH (ref 6–29)
AST: 40 U/L — ABNORMAL HIGH (ref 10–35)
Albumin: 3.6 g/dL (ref 3.6–5.1)
Alkaline phosphatase (APISO): 67 U/L (ref 33–130)
BUN/Creatinine Ratio: 12 (calc) (ref 6–22)
BUN: 18 mg/dL (ref 7–25)
CALCIUM: 8.9 mg/dL (ref 8.6–10.4)
CO2: 25 mmol/L (ref 20–32)
Chloride: 104 mmol/L (ref 98–110)
Creat: 1.52 mg/dL — ABNORMAL HIGH (ref 0.50–1.05)
GLUCOSE: 124 mg/dL — AB (ref 65–99)
Globulin: 2.9 g/dL (calc) (ref 1.9–3.7)
Potassium: 4.5 mmol/L (ref 3.5–5.3)
Sodium: 139 mmol/L (ref 135–146)
TOTAL PROTEIN: 6.5 g/dL (ref 6.1–8.1)
Total Bilirubin: 0.4 mg/dL (ref 0.2–1.2)

## 2016-12-19 LAB — CBC WITH DIFFERENTIAL/PLATELET
Basophils Absolute: 44 cells/uL (ref 0–200)
Basophils Relative: 0.4 %
EOS PCT: 1.4 %
Eosinophils Absolute: 153 cells/uL (ref 15–500)
HCT: 36.9 % (ref 35.0–45.0)
Hemoglobin: 12.3 g/dL (ref 11.7–15.5)
Lymphs Abs: 3423 cells/uL (ref 850–3900)
MCH: 31.7 pg (ref 27.0–33.0)
MCHC: 33.3 g/dL (ref 32.0–36.0)
MCV: 95.1 fL (ref 80.0–100.0)
MPV: 11 fL (ref 7.5–12.5)
Monocytes Relative: 7.5 %
NEUTROS PCT: 59.3 %
Neutro Abs: 6464 cells/uL (ref 1500–7800)
PLATELETS: 231 10*3/uL (ref 140–400)
RBC: 3.88 10*6/uL (ref 3.80–5.10)
RDW: 13.9 % (ref 11.0–15.0)
Total Lymphocyte: 31.4 %
WBC mixed population: 818 cells/uL (ref 200–950)
WBC: 10.9 10*3/uL — AB (ref 3.8–10.8)

## 2016-12-19 LAB — LIPID PANEL
CHOLESTEROL: 166 mg/dL (ref <200)
HDL: 44 mg/dL — ABNORMAL LOW (ref >50)
LDL CHOLESTEROL (CALC): 95 mg/dL
Non-HDL Cholesterol (Calc): 122 mg/dL (calc) (ref <130)
TRIGLYCERIDES: 172 mg/dL — AB (ref <150)
Total CHOL/HDL Ratio: 3.8 (calc) (ref <5.0)

## 2016-12-22 ENCOUNTER — Other Ambulatory Visit: Payer: Self-pay | Admitting: Family Medicine

## 2016-12-22 ENCOUNTER — Telehealth: Payer: Self-pay | Admitting: *Deleted

## 2016-12-22 ENCOUNTER — Other Ambulatory Visit: Payer: Self-pay | Admitting: *Deleted

## 2016-12-22 DIAGNOSIS — N183 Chronic kidney disease, stage 3 unspecified: Secondary | ICD-10-CM

## 2016-12-22 NOTE — Telephone Encounter (Signed)
Pharmacy made aware.   Patient aware per MyChart.

## 2016-12-22 NOTE — Telephone Encounter (Signed)
She has to take as directed Only 1 tablet three times a day  Call pharmacy [place] early refill just once, advise of depression under psychiatrist care,  Tell pt I can only do this once. If she takes too many and runs out she will go in withdrawel

## 2016-12-22 NOTE — Telephone Encounter (Signed)
Received call from patient.   Reports that she has been having increased stressors in her life and has taken more Ativan than she is prescribed. Reports that she currently has #7 tablets and refill is not sue until beginning of October. Requested MD to advise.

## 2016-12-27 ENCOUNTER — Other Ambulatory Visit: Payer: Self-pay | Admitting: Family Medicine

## 2016-12-28 NOTE — Telephone Encounter (Signed)
Ok to refill??  Last office visit 12/18/2016.  Last refill 07/31/2016, #2 refills.

## 2016-12-29 NOTE — Telephone Encounter (Signed)
Rx called in 

## 2016-12-29 NOTE — Telephone Encounter (Signed)
Okay to refill? 

## 2017-01-18 ENCOUNTER — Other Ambulatory Visit: Payer: Self-pay | Admitting: Family Medicine

## 2017-01-19 ENCOUNTER — Ambulatory Visit: Payer: Self-pay | Admitting: Endocrinology

## 2017-01-20 ENCOUNTER — Encounter: Payer: Self-pay | Admitting: Family Medicine

## 2017-01-20 ENCOUNTER — Other Ambulatory Visit: Payer: Self-pay | Admitting: Family Medicine

## 2017-01-22 ENCOUNTER — Ambulatory Visit (INDEPENDENT_AMBULATORY_CARE_PROVIDER_SITE_OTHER): Payer: Medicaid Other | Admitting: Endocrinology

## 2017-01-22 ENCOUNTER — Encounter: Payer: Self-pay | Admitting: Endocrinology

## 2017-01-22 ENCOUNTER — Other Ambulatory Visit: Payer: Self-pay

## 2017-01-22 VITALS — BP 124/74 | HR 76 | Ht 64.0 in | Wt 147.4 lb

## 2017-01-22 DIAGNOSIS — Z794 Long term (current) use of insulin: Secondary | ICD-10-CM | POA: Diagnosis not present

## 2017-01-22 DIAGNOSIS — R110 Nausea: Secondary | ICD-10-CM

## 2017-01-22 DIAGNOSIS — E1165 Type 2 diabetes mellitus with hyperglycemia: Secondary | ICD-10-CM

## 2017-01-22 DIAGNOSIS — N289 Disorder of kidney and ureter, unspecified: Secondary | ICD-10-CM | POA: Diagnosis not present

## 2017-01-22 DIAGNOSIS — E063 Autoimmune thyroiditis: Secondary | ICD-10-CM | POA: Diagnosis not present

## 2017-01-22 LAB — CBC WITH DIFFERENTIAL/PLATELET
BASOS ABS: 0.1 10*3/uL (ref 0.0–0.1)
Basophils Relative: 0.6 % (ref 0.0–3.0)
Eosinophils Absolute: 0 10*3/uL (ref 0.0–0.7)
Eosinophils Relative: 0.4 % (ref 0.0–5.0)
HCT: 42.5 % (ref 36.0–46.0)
Hemoglobin: 14.2 g/dL (ref 12.0–15.0)
LYMPHS ABS: 2.1 10*3/uL (ref 0.7–4.0)
Lymphocytes Relative: 24.2 % (ref 12.0–46.0)
MCHC: 33.3 g/dL (ref 30.0–36.0)
MCV: 99.3 fl (ref 78.0–100.0)
MONOS PCT: 6.5 % (ref 3.0–12.0)
Monocytes Absolute: 0.6 10*3/uL (ref 0.1–1.0)
NEUTROS ABS: 6 10*3/uL (ref 1.4–7.7)
Neutrophils Relative %: 68.3 % (ref 43.0–77.0)
PLATELETS: 229 10*3/uL (ref 150.0–400.0)
RBC: 4.28 Mil/uL (ref 3.87–5.11)
RDW: 15.2 % (ref 11.5–15.5)
WBC: 8.9 10*3/uL (ref 4.0–10.5)

## 2017-01-22 LAB — COMPREHENSIVE METABOLIC PANEL
ALK PHOS: 58 U/L (ref 39–117)
ALT: 16 U/L (ref 0–35)
AST: 25 U/L (ref 0–37)
Albumin: 4.2 g/dL (ref 3.5–5.2)
BUN: 24 mg/dL — AB (ref 6–23)
CO2: 23 mEq/L (ref 19–32)
CREATININE: 1.4 mg/dL — AB (ref 0.40–1.20)
Calcium: 9.6 mg/dL (ref 8.4–10.5)
Chloride: 108 mEq/L (ref 96–112)
GFR: 50.82 mL/min — ABNORMAL LOW (ref >60.00)
GLUCOSE: 115 mg/dL — AB (ref 70–99)
Potassium: 4 mEq/L (ref 3.5–5.1)
Sodium: 138 mEq/L (ref 135–145)
Total Bilirubin: 0.4 mg/dL (ref 0.2–1.2)
Total Protein: 7.4 g/dL (ref 6.0–8.3)

## 2017-01-22 LAB — TSH: TSH: 0.67 u[IU]/mL (ref 0.35–4.50)

## 2017-01-22 MED ORDER — PROMETHAZINE HCL 25 MG PO TABS: 25.0000 mg | ORAL_TABLET | Freq: Three times a day (TID) | ORAL | 1 refills | Status: DC | PRN

## 2017-01-22 MED ORDER — HYDROCODONE-ACETAMINOPHEN 10-325 MG PO TABS: 1.0000 | ORAL_TABLET | Freq: Four times a day (QID) | ORAL | 0 refills | Status: DC | PRN

## 2017-01-22 NOTE — Progress Notes (Signed)
Patient ID: Kathryn Oconnell, female   DOB: 05/22/1965, 51 y.o.   MRN: [id number]           Reason for Appointment: Follow-up for Type 2 Diabetes  Referring physician: Dr. Buelah Manis   History of Present Illness:          Date of diagnosis of type 2 diabetes mellitus: 2000        Background history:   She had been on metformin for a few years but this was stopped in 1/16 when her renal function was impaired. She says she had been doing fairly well with this Trulicity 4.33 mg weekly In 10/16 she had significant hyperglycemia while taking oral hypoglycemic drugs and she was started on insulin She was switched back to oral agents with glipizide and Onglyza in 1/17 However subsequently blood sugars started going up and she was back on insulin in April 2017  Recent history:   INSULIN regimen is:  TRESIBA 10-15 in am, Novolog recently 2-3 U with  meals  Non-insulin hypoglycemic drugs the patient is taking are: Ozempic 0.5 mg weekly  A1c is in September was 7.3  Current management, blood sugar patterns and problems identified:  She has been taking Ozempic since her last visit about a month ago  At that time she was having significantly high fasting readings and was also not doing well with her diet or day-to-day management  Also was depressed significantly   Because of her desire to lose weight she was switched from Victoza to [place] and she is taking 0.5 mg weekly  However she has had marked increase in appetite and persistent nausea which she did not report until today  She has lost 13 pounds since her last visit  She has cut back on her insulin significantly because of tendency to hypoglycemia and is taking only 10-15 units instead of 20 units of the [place] that was recommended  Also has started using the freestyle [place] system since her last visit, she said that she has difficulty having the sensor stay on for more than 5 days at a time  She has had tendency to hypoglycemia  fairly regularly either on waking up and sometimes before or after supper  Some of her low sugars after evening meals may be related to her taking NovoLog and not eating as much         Side effects from medications have been: None  Compliance with the medical regimen: Fair HYPOGLYCEMIA: May feel shaky when symptomatic, does not always have symptoms with low sugar readings  Glucose monitoring:  done 1 times a day         Glucometer:  Libre Blood Glucose readings from [employer]:  She has 36% of her readings below 70, 59% in target range and 5% above 180  PRE-MEAL Fasting Lunch Dinner Bedtime Overall  Glucose range: 40-219  50-149  40-106  40-204    Mean/median: 76     92+/-41    POST-MEAL PC Breakfast PC Lunch PC Dinner  Glucose range:     Mean/median:  112  102     Self-care: The diet that the patient has been following is: Reducing portions Meal times are:  Breakfast is at 10 AM, Lunch: Usually none Dinner: 7-8 PM   Typical meal intake: Breakfast is at 10 am, rarely eating fast food biscuits.  Evening meal is meat or chicken with potatoes, greens, macaroni.  Snacks on chips.  She will drink water and some juice,  usually diet drinks                Dietician visit, most recent: 6/17               Exercise: walking 1-2/7 days, about 30 minutes in the evening  Weight history: Previous range 119-196  Wt Readings from Last 3 Encounters:  01/22/17 147 lb 6.4 oz (66.9 kg)  12/18/16 160 lb (72.6 kg)  12/18/16 159 lb (72.1 kg)    Glycemic control:   Lab Results  Component Value Date   HGBA1C 7.3 12/18/2016   HGBA1C 7.6 08/25/2016   HGBA1C 6.4 05/25/2016   Lab Results  Component Value Date   MICROALBUR 4.3 (H) 08/25/2016   LDLCALC 93 02/08/2016   CREATININE 1.52 (H) 12/18/2016   Lab Results  Component Value Date   MICRALBCREAT 3.2 08/25/2016    Other active problems discussed today: See review of systems   Allergies as of 01/22/2017      Reactions     Erythromycin Other (See Comments)   Stomach cramps   Imitrex [sumatriptan] Other (See Comments)   Migraine and increased BP   Sulfa Antibiotics Other (See Comments)   Causes stomach cramps   Topamax [topiramate] Nausea And Vomiting   Nausea and worsening      Medication List       Accurate as of 01/22/17 12:45 PM. Always use your most recent med list.          amLODipine 10 MG tablet Commonly known as:  NORVASC Take 0.5 tablets (5 mg total) by mouth daily.   aspirin 81 MG tablet Take 81 mg by mouth daily.   atorvastatin 20 MG tablet Commonly known as:  LIPITOR Take 1 tablet (20 mg total) by mouth daily.   Azelastine-Fluticasone 137-50 MCG/ACT Susp Place into the nose.   ciclopirox 8 % solution Commonly known as:  PENLAC   CONTOUR NEXT EZ MONITOR w/Device Kit Use to monitor FSBS 2x daily. Dx: E11.9.   cyclobenzaprine 10 MG tablet Commonly known as:  FLEXERIL Take 1 tablet (10 mg total) by mouth 3 (three) times daily as needed.   fluticasone 50 MCG/ACT nasal spray Commonly known as:  FLONASE Place 2 sprays into the nose daily. For sinuses/allergies   FREESTYLE LIBRE READER Devi 1 Device by Does not apply route as directed.   [place] Misc Apply to upper arm and change sensor every 10 days   glucose blood test strip Commonly known as:  BAYER CONTOUR NEXT TEST Use to monitor FSBS 2x daily. Dx: E11.9.   HYDROcodone-acetaminophen 10-325 MG tablet Commonly known as:  NORCO Take 1 tablet by mouth every 6 (six) hours as needed.   insulin aspart 100 UNIT/ML FlexPen Commonly known as:  NOVOLOG FLEXPEN Inject 5 to 60 units twice daily with meals as needed per sliding scale.   insulin degludec 100 UNIT/ML Sopn FlexTouch Pen Commonly known as:  TRESIBA FLEXTOUCH Inject 0.24 mLs (24 Units total) into the skin daily.   levothyroxine 100 MCG tablet Commonly known as:  SYNTHROID, LEVOTHROID Take 1 tablet (100 mcg total) by mouth daily.    LORazepam 1 MG tablet Commonly known as:  ATIVAN Take 1 tablet (1 mg total) by mouth 3 (three) times daily as needed. for anxiety   medroxyPROGESTERone 2.5 MG tablet Commonly known as:  PROVERA Take 1 tablet (2.5 mg total) by mouth daily.   metoprolol tartrate 25 MG tablet Commonly known as:  LOPRESSOR TAKE 1 TABLET BY  MOUTH TWICE DAILY   ONE TOUCH LANCETS Misc Use to monitor FSBS 1x daily. Dx: E11.9.   promethazine 25 MG tablet Commonly known as:  PHENERGAN Take 1 tablet (25 mg total) by mouth every 8 (eight) hours as needed for nausea or vomiting.   [place]ION PEN NEEDLES 31G X 6 MM Misc Generic drug:  Insulin Pen Needle USE AS DIRECTED   Semaglutide 0.25 or 0.5 MG/DOSE Sopn Commonly known as:  OZEMPIC Inject 0.5 mg into the skin once a week.   sertraline 100 MG tablet Commonly known as:  ZOLOFT   valACYclovir 1000 MG tablet Commonly known as:  VALTREX Take 1 tablet (1,000 mg total) by mouth daily.   zolpidem 12.5 MG CR tablet Commonly known as:  AMBIEN CR TAKE 1 TABLET BY MOUTH ONCE DAILY AT BEDTIME AS NEEDED       Allergies:  Allergies  Allergen Reactions  . Erythromycin Other (See Comments)    Stomach cramps  . Imitrex [Sumatriptan] Other (See Comments)    Migraine and increased BP  . Sulfa Antibiotics Other (See Comments)    Causes stomach cramps  . Topamax [Topiramate] Nausea And Vomiting    Nausea and worsening    Past Medical History:  Diagnosis Date  . Anxiety   . Chronic kidney disease   . COPD (chronic obstructive pulmonary disease) ([place])   . Depression   . Diabetes mellitus without complication ([place])   . Eczema   . Graves disease   . HSV-2 infection   . Hyperlipidemia   . Hypertension   . Hypothyroid   . Lung nodule   . Menopausal vaginal dryness 02/24/2014  . Migraines   . Neuropathy   . Tobacco use   . Trouble in sleeping 06/25/2015    Past Surgical History:  Procedure Laterality Date  . CESAREAN SECTION     x 2  . FOOT  SURGERY     5th digit left foot / Great toe, 2ND TOE  . THYROID SURGERY      Family History  Problem Relation Age of Onset  . Stroke Mother        x 3  . Cancer Mother        breast   . Thyroid disease Mother   . Hyperlipidemia Mother   . Cancer Father        stomach cancer   . Hypertension Father   . Heart attack Father 45  . Gout Brother   . Migraines Son   . Hypertension Paternal Uncle   . Heart disease Maternal Grandmother   . Heart attack Maternal Grandmother   . Heart disease Maternal Grandfather   . Heart attack Maternal Grandfather   . Cancer Paternal Grandmother   . Diabetes Paternal Grandfather   . Asthma Daughter     Social History:  reports that she has been smoking Cigarettes.  She has a 17.50 pack-year smoking history. She has never used smokeless tobacco. She reports that she does not drink alcohol or use drugs.   Review of Systems   She has been on Zoloft 150 mg more recently and previously was on Lexapro She has had nausea as discussed in history of present illness and some reflux also   NEUROPATHY: She was given Lyrica for symptomatic relief  RENAL function abnormalities: Shs had  high creatinine  check by PCP last month and this needs follow-up  Lipid history:  on Lipitor 20 mg with adequate control    Lab Results  Component  Value Date   CHOL 166 12/18/2016   HDL 44 (L) 12/18/2016   LDLCALC 93 02/08/2016   LDLDIRECT 95 10/04/2015   TRIG 172 (H) 12/18/2016   CHOLHDL 3.8 12/18/2016           Hypertension: mild, on metoprolol 12.5 mg twice a day along with amlodipine She has been followed by PCP    BP Readings from Last 3 Encounters:  01/22/17 124/74  12/18/16 128/79  12/18/16 116/68    Most recent eye exam was In 1/17  Most recent foot exam: 8/17  HYPOTHYROIDISM:  She is on long-term supplementation with levothyroxine The dose was increased from 88 up to 100 g previously and last TSH is normal Repeat TSH pending from today    Lab Results  Component Value Date   TSH 0.68 08/25/2016   TSH 1.33 05/25/2016   TSH 1.16 05/10/2016   FREET4 1.10 08/25/2016   FREET4 0.88 11/17/2015   FREET4 1.17 03/24/2015      Physical Examination:  BP 124/74   Pulse 76   Ht 5' 4"  (1.626 m)   Wt 147 lb 6.4 oz (66.9 kg)   LMP 06/18/2013 Comment: irregular  BMI 25.30 kg/m    ASSESSMENT:  Diabetes type 2, BMI 27  See history of present illness for detailed discussion of current diabetes management, blood sugar patterns and problems identified  She is on a regimen of basal bolus insulin and Ozempic  She has had marked decrease in her weight.  Because of decreased appetite and nausea and requiring much less insulin Blood sugars are averaging below 100 at home with frequent hypoglycemia now even with reducing both basal and bolus doses on her own She is monitoring blood sugars much more frequently because of using the [place] system currently Also may be doing better with using [place] compared to Lantus previously Not clear if she really needs to be on insulin at this time especially basal  RENAL dysfunction: To be checked today She will follow-up with PCP tomorrow  Hypothyroid; she may need lower doses of levothyroxine because of her significant weight loss  NAUSEA: This may be from [place] but also possibly from [place], she will also discuss with PCP Phenergan prescription given    PLAN:    She will temporarily stop to [place]  She will hold off on her next injection of Ozempic until her nausea is improving and then try 0.25 mg weekly  If she still has significant nausea with this she may need to go back to Victoza  Given her flowsheet in case she does start to need basal insulin again: She will start with 6 units of Tresiba once blood sugars are consistently over 130 and titrate by one unit every 3 days to keep fastings between 90-130  Also may need to adjust her NovoLog based on her meal size and  carbohydrate intake and may continue to take 2-3 units  She will continue to use the freestyle [place] system and discuss using either Tegaderm or a armband she can buy from [employer]  Regular exercise  Follow-up in 1 month  Patient Instructions  Take 0.25 weekly of Ozempic  Stop Tresiba till am sugars > 130  Counseling time on subjects discussed in assessment and plan sections is over 50% of today's 25 minute visit     Joven Mom 01/22/2017, 12:45 PM   Note: This office note was prepared with Estate agent. Any transcriptional errors that result from this process are unintentional.

## 2017-01-22 NOTE — Patient Instructions (Addendum)
Take 0.25 weekly of Ozempic  Stop Tresiba till am sugars > 130

## 2017-01-23 ENCOUNTER — Ambulatory Visit (INDEPENDENT_AMBULATORY_CARE_PROVIDER_SITE_OTHER): Payer: BLUE CROSS/BLUE SHIELD | Admitting: Family Medicine

## 2017-01-23 ENCOUNTER — Encounter: Payer: Self-pay | Admitting: Family Medicine

## 2017-01-23 ENCOUNTER — Telehealth: Payer: Self-pay | Admitting: *Deleted

## 2017-01-23 VITALS — BP 126/72 | HR 82 | Temp 98.2°F | Resp 16 | Ht 64.0 in | Wt 146.0 lb

## 2017-01-23 DIAGNOSIS — R110 Nausea: Secondary | ICD-10-CM | POA: Diagnosis not present

## 2017-01-23 DIAGNOSIS — J209 Acute bronchitis, unspecified: Secondary | ICD-10-CM

## 2017-01-23 DIAGNOSIS — J0100 Acute maxillary sinusitis, unspecified: Secondary | ICD-10-CM

## 2017-01-23 MED ORDER — PREDNISONE 20 MG PO TABS: 40.0000 mg | ORAL_TABLET | Freq: Every day | ORAL | 0 refills | Status: DC

## 2017-01-23 MED ORDER — LEVOFLOXACIN 500 MG PO TABS: 500.0000 mg | ORAL_TABLET | Freq: Every day | ORAL | 0 refills | Status: DC

## 2017-01-23 NOTE — Telephone Encounter (Signed)
Received PA determination.   PA approved x180 days.   [place] [id number].  Pharmacy made aware.

## 2017-01-23 NOTE — Telephone Encounter (Signed)
Received request from pharmacy for PA on Hydrocodone.   PA submitted. Confirmation #: [id number]  Dx: Chronic Pain- G89.4

## 2017-01-23 NOTE — Progress Notes (Signed)
   Subjective:    Patient ID: Kathryn Oconnell, female    DOB: 1965-07-06, 51 y.o.   MRN: [id number]  Patient presents for Illness (x2 weeks- productive cough, chest congestion) She here with cough with production wheezing worsened over the past 2 weeks.  She has not had any fever Started after flu shot  She is a smoker Took mucinex  Has also had sinus pressure and drainage. Using atrovent, ear pain bilat   Has had 13lbs wiegh tloss, seen by endocrinology , had THS, CBC AND CMET yesterday, look okay , told to hold Ozempic and reduce Tresiba  No diarrhea, no vomiting  + smoker    Review Of Systems:  GEN- denies fatigue, fever, weight loss,weakness, recent illness HEENT- denies eye drainage, change in vision, +nasal discharge, CVS- denies chest pain, palpitations RESP- denies SOB,+ cough, wheeze ABD- denies N/V, change in stools,+abd pain GU- denies dysuria, hematuria, dribbling, incontinence MSK- denies joint pain, muscle aches, injury Neuro- denies headache, dizziness, syncope, seizure activity       Objective:    BP 126/72   Pulse 82   Temp 98.2 F (36.8 C) (Oral)   Resp 16   Ht 5\' 4"  (1.626 m)   Wt 146 lb (66.2 kg)   LMP 06/18/2013 Comment: irregular  SpO2 98%   BMI 25.06 kg/m  GEN- NAD, alert and oriented x3 HEENT- PERRL, EOMI, non injected sclera, pink conjunctiva, MMM, oropharynx clear , TM clear bilat no effusion,  + maxillary sinus tenderness, inflammed turbinates,  Nasal drainage  Neck- Supple, no LAD CVS- RRR, no murmur RESP-mild upper airway congestion, otherwise clear  ABD-NABS,soft,NT,ND  EXT- No edema Pulses- Radial 2+          Assessment & Plan:      Problem List Items Addressed This Visit    None    Visit Diagnoses    Acute maxillary sinusitis, recurrence not specified    -  Primary   CHronic problems for her has aseen ENT, also has some bronchitis. Given Steroid burst, levaquin, continue atrovent, mucinex    Relevant Medications   predniSONE (DELTASONE) 20 MG tablet   levofloxacin (LEVAQUIN) 500 MG tablet   Acute bronchitis, unspecified organism       Nausea       [place] exam and labs from yesterday. Likely medication related, agree with holding the Ozempic       Note: This dictation was prepared with Dragon dictation along with smaller phrase technology. Any transcriptional errors that result from this process are unintentional.

## 2017-01-23 NOTE — Patient Instructions (Signed)
Take antibiotics as prescribed Take the steroid  F/U as before

## 2017-01-31 ENCOUNTER — Telehealth: Payer: Self-pay | Admitting: Endocrinology

## 2017-01-31 NOTE — Telephone Encounter (Signed)
Initiated but has to try and fail 2 meds that Dr. Dwyane Dee has to choose from and he has been notified

## 2017-01-31 NOTE — Telephone Encounter (Signed)
Caller Name:Brianna   Relationship to Patient:  Best Number: [phone]  Pharmacy: [facility]  Reason for call: Prior auth needed for pt    Semaglutide (OZEMPIC) 0.25 or 0.5 MG/DOSE [facility]   Reference #  [facility]

## 2017-01-31 NOTE — Telephone Encounter (Signed)
Not sure if you have started this but if not please complete. Thank you!

## 2017-02-01 ENCOUNTER — Other Ambulatory Visit: Payer: Self-pay

## 2017-02-01 ENCOUNTER — Other Ambulatory Visit: Payer: Self-pay | Admitting: Endocrinology

## 2017-02-01 MED ORDER — EXENATIDE ER 2 MG/0.85ML ~~LOC~~ AUIJ
2.0000 mg | AUTO-INJECTOR | SUBCUTANEOUS | 3 refills | Status: DC
Start: 2017-02-01 — End: 2017-02-01

## 2017-02-05 ENCOUNTER — Other Ambulatory Visit: Payer: Self-pay | Admitting: Family Medicine

## 2017-02-05 NOTE — Telephone Encounter (Signed)
Ok to refill??  Last office visit 01/23/2017.  Last refill 11/03/2016, #1 refills.

## 2017-02-05 NOTE — Telephone Encounter (Signed)
Okay to refill? 

## 2017-02-06 ENCOUNTER — Telehealth: Payer: Self-pay | Admitting: *Deleted

## 2017-02-06 ENCOUNTER — Other Ambulatory Visit: Payer: Self-pay | Admitting: Family Medicine

## 2017-02-06 NOTE — Telephone Encounter (Signed)
Can you please complete this PA again? The 3 medications that have been tried and failed are Bydureon, Trulicity, and Victoza. Thank you!

## 2017-02-06 NOTE — Telephone Encounter (Signed)
Called patient and she stated that her blood sugars are doing better now that she has finished her prednisone and her antibiotics and that she is happy with the Ozempic. Patient stated that she received a denial letter for the PA for Ozempic. Patient stated that she has tried the Trulicity from Dr. Buelah Manis and had a bad reaction to this medication. I see in the notes that she has tried Bydureon and Victoza also and failed these also. Please advise if okay to redo the Ozempic PA for the patient. We have a new form.

## 2017-02-06 NOTE — Telephone Encounter (Signed)
Please do a PA for Ozempic

## 2017-02-06 NOTE — Telephone Encounter (Signed)
Patient called and states she was told the last time she seen Dr. Dwyane Dee that when she started the Ozempic that she was suppose to provide her blood sugar reading. Her blood sugar was running high the first week because she was on the prednisone and a antibiotic. She states her blood sugar has not dropped since she has been on the medication . If you have any questions please call the patient . Her contact number is [phone]. Please advise. Thank you

## 2017-02-06 NOTE — Telephone Encounter (Signed)
Forwarded

## 2017-02-06 NOTE — Telephone Encounter (Signed)
Medication called to pharmacy. 

## 2017-02-07 ENCOUNTER — Telehealth: Payer: Self-pay | Admitting: Family Medicine

## 2017-02-07 NOTE — Telephone Encounter (Signed)
Patient is calling to speak to you, however would not leave a reason why  [phone]

## 2017-02-08 ENCOUNTER — Other Ambulatory Visit: Payer: Self-pay

## 2017-02-08 ENCOUNTER — Encounter: Payer: Self-pay | Admitting: Endocrinology

## 2017-02-08 ENCOUNTER — Other Ambulatory Visit: Payer: Self-pay | Admitting: Endocrinology

## 2017-02-08 ENCOUNTER — Encounter: Payer: Medicaid Other | Admitting: Endocrinology

## 2017-02-08 MED ORDER — ALBIGLUTIDE 30 MG ~~LOC~~ PEN: 30.0000 mg | PEN_INJECTOR | SUBCUTANEOUS | 4 refills | Status: DC

## 2017-02-08 NOTE — Telephone Encounter (Signed)
Please send prescription for Tanzeum 30 mg weekly

## 2017-02-08 NOTE — Telephone Encounter (Signed)
Spoke with Selena at [facility] and she stated the patient needs to try and fail one of the following: [place]  Tanzeum  Please advise on how you would like for me to proceed with the PA for Ozempic

## 2017-02-08 NOTE — Telephone Encounter (Signed)
I sent tanzeum 30 mg weekly to the patients pharmacy per Dr. Jodelle Green request

## 2017-02-09 ENCOUNTER — Telehealth: Payer: Self-pay

## 2017-02-09 NOTE — Progress Notes (Signed)
This encounter was created in error - please disregard.

## 2017-02-09 NOTE — Telephone Encounter (Signed)
PA for tanzeum approved and patient has been notified- approved through 02/04/18

## 2017-02-09 NOTE — Telephone Encounter (Signed)
This prescription has been sent.

## 2017-02-14 ENCOUNTER — Other Ambulatory Visit: Payer: Self-pay

## 2017-02-14 ENCOUNTER — Other Ambulatory Visit: Payer: Self-pay | Admitting: Family Medicine

## 2017-02-15 ENCOUNTER — Telehealth: Payer: Self-pay | Admitting: *Deleted

## 2017-02-15 NOTE — Telephone Encounter (Signed)
Received request from pharmacy for PA on Zolpidem ER.   PA submitted.   Dx: Insomnia   Confirmation #:[id number] W  Prior Approval #:[id number] x180 days.  Pharmacy made aware.

## 2017-02-19 ENCOUNTER — Ambulatory Visit: Payer: Self-pay | Admitting: Endocrinology

## 2017-02-19 ENCOUNTER — Encounter: Payer: Self-pay | Admitting: Family Medicine

## 2017-02-19 MED ORDER — HYDROCODONE-ACETAMINOPHEN 10-325 MG PO TABS: 1.0000 | ORAL_TABLET | Freq: Four times a day (QID) | ORAL | 0 refills | Status: DC | PRN

## 2017-02-21 ENCOUNTER — Ambulatory Visit: Payer: Self-pay | Admitting: Endocrinology

## 2017-03-05 ENCOUNTER — Other Ambulatory Visit: Payer: Self-pay | Admitting: Family Medicine

## 2017-03-06 MED ORDER — ATORVASTATIN CALCIUM 20 MG PO TABS: 20.0000 mg | ORAL_TABLET | Freq: Every day | ORAL | 0 refills | Status: DC

## 2017-03-20 ENCOUNTER — Telehealth: Payer: Self-pay | Admitting: Endocrinology

## 2017-03-20 NOTE — Telephone Encounter (Signed)
[employer] needs prior authorization request that was faxed for [place]. Ph# [phone] Ref# Jeanella Flattery

## 2017-03-21 ENCOUNTER — Other Ambulatory Visit: Payer: Self-pay | Admitting: Family Medicine

## 2017-03-21 ENCOUNTER — Encounter: Payer: Self-pay | Admitting: Family Medicine

## 2017-03-21 MED ORDER — HYDROCODONE-ACETAMINOPHEN 10-325 MG PO TABS: 1.0000 | ORAL_TABLET | Freq: Four times a day (QID) | ORAL | 0 refills | Status: DC | PRN

## 2017-03-21 NOTE — Telephone Encounter (Signed)
Pt called to set up appt. Before scheduling she wants to know if she can continue her script.   Please advise thanks

## 2017-03-21 NOTE — Telephone Encounter (Signed)
Patient stated that her blood sugars have been higher since she stopped the Ozempic a month ago but still having some lows when she takes the [place].   She wants to know if she can take 0.25 of Ozempic. She also states the Novolog has been causing severe lows and it reads "LOW" on her [employer]. She is currently taking around 8-10 units on large meals and 3 units for small meals of her Novolog. She is also taking 20 units of the [place].  Please advise.

## 2017-03-21 NOTE — Telephone Encounter (Signed)
She can try 0.25 Ozempic but she needs to make a follow-up appointment to replace her no-show appointment as soon as possible

## 2017-03-21 NOTE — Telephone Encounter (Signed)
I called patient and gave her the message. I have made her an appointment to come tomorrow on 03/22/2017 at 10:45am.

## 2017-03-21 NOTE — Telephone Encounter (Signed)
okay

## 2017-03-21 NOTE — Progress Notes (Signed)
Patient ID: Kathryn Oconnell, female   DOB: 1965/05/14, 51 y.o.   MRN: [id number]           Reason for Appointment: Follow-up for Type 2 Diabetes  Referring physician: Dr. Buelah Manis   History of Present Illness:          Date of diagnosis of type 2 diabetes mellitus: 2000        Background history:   She had been on metformin for a few years but this was stopped in 1/16 when her renal function was impaired. She says she had been doing fairly well with this Trulicity 5.39 mg weekly In 10/16 she had significant hyperglycemia while taking oral hypoglycemic drugs and she was started on insulin She was switched back to oral agents with glipizide and Onglyza in 1/17 However subsequently blood sugars started going up and she was back on insulin in April 2017  Recent history:   INSULIN regimen is:  TRESIBA 20 in am, Novolog recently 8-10 U with  meals  Non-insulin hypoglycemic drugs the patient is taking are: Ozempic 0.25 mg weekly starting 03/21/17  A1c is in September was 7.3 and is now 6.8  Current management, blood sugar patterns and problems identified:  She was taken off Ozempic on her last visit in October and was told to start back on 0.25 weekly when her nausea dissipated but she did not start until yesterday and did not follow-up as directed also  Previously her blood sugars were in the hypoglycemic range and she was also told to hold off on her TRESIBA  However she is now up to 20 units of that in the morning daily  Again on her own she has taken sporadic doses of his 3 only when her blood sugar goes up significantly postprandially  With this occasional she has had hypoglycemia 2-4 hours later  She is currently not having any problems with nausea and regained some loss   OVERNIGHT blood sugars have been variable but have been in the slightly low range at least 6 times overnight in the last 2 weeks.  Also has had blood sugars below 70 about 13% of the time recently           Side effects from medications have been: None  Compliance with the medical regimen: Fair HYPOGLYCEMIA: May feel shaky when symptomatic, does not always have symptoms with low sugar readings  Glucose monitoring:  done 1 times a day         Glucometer:  Libre Blood Glucose readings from [employer]:  She has used her freestyle Libre 76% of the time in the last 2 weeks 75% of her readings are within target and 12% above along with 13% below 70  PRE-MEAL Fasting Lunch Dinner Bedtime Overall  Glucose range: 59-1 55       Mean/median: 119  132  129   125+/-51    POST-MEAL PC Breakfast PC Lunch PC Dinner  Glucose range:     Mean/median: 135  130  129    Self-care: The diet that the patient has been following is: Reducing portions Meal times are:  Breakfast is at 10 AM, Lunch: 2 Dinner: 7 PM   Typical meal intake: Breakfast is at 10 am, rarely eating fast food biscuits.  Evening meal is meat or chicken with potatoes, greens, macaroni.  Snacks on chips.  She will drink water and some juice, usually diet drinks  Dietician visit, most recent: 6/17               Exercise: walking 3/7 days, about 30 minutes in the evening  Weight history: Previous range 119-196  Wt Readings from Last 3 Encounters:  03/22/17 154 lb 6.4 oz (70 kg)  02/08/17 150 lb (68 kg)  01/23/17 146 lb (66.2 kg)    Glycemic control:   Lab Results  Component Value Date   HGBA1C 6.8 03/22/2017   HGBA1C 7.3 12/18/2016   HGBA1C 7.6 08/25/2016   Lab Results  Component Value Date   MICROALBUR 4.3 (H) 08/25/2016   LDLCALC 93 02/08/2016   CREATININE 1.40 (H) 01/22/2017   Lab Results  Component Value Date   MICRALBCREAT 3.2 08/25/2016    Other active problems discussed today: See review of systems   Allergies as of 03/22/2017      Reactions   Erythromycin Other (See Comments)   Stomach cramps   Imitrex [sumatriptan] Other (See Comments)   Migraine and increased BP   Sulfa  Antibiotics Other (See Comments)   Causes stomach cramps   Topamax [topiramate] Nausea And Vomiting   Nausea and worsening      Medication List        Accurate as of 03/22/17  8:54 PM. Always use your most recent med list.          amLODipine 10 MG tablet Commonly known as:  NORVASC Take 0.5 tablets (5 mg total) by mouth daily.   aspirin 81 MG tablet Take 81 mg by mouth daily.   atorvastatin 20 MG tablet Commonly known as:  LIPITOR Take 1 tablet (20 mg total) by mouth daily.   Azelastine-Fluticasone 137-50 MCG/ACT Susp Place into the nose.   chlorhexidine 0.12 % solution Commonly known as:  PERIDEX USE AS DIRECTED 15 MLS IN THE MOUTH OR THROAT TWICE DAILY   ciclopirox 8 % solution Commonly known as:  PENLAC   CONTOUR NEXT EZ MONITOR w/Device Kit Use to monitor FSBS 2x daily. Dx: E11.9.   cyclobenzaprine 10 MG tablet Commonly known as:  FLEXERIL Take 1 tablet (10 mg total) by mouth 3 (three) times daily as needed.   fluticasone 50 MCG/ACT nasal spray Commonly known as:  FLONASE Place 2 sprays into the nose daily. For sinuses/allergies   FREESTYLE LIBRE READER Devi 1 Device by Does not apply route as directed.   [place] Misc Apply to upper arm and change sensor every 10 days   glucose blood test strip Commonly known as:  BAYER CONTOUR NEXT TEST Use to monitor FSBS 2x daily. Dx: E11.9.   HYDROcodone-acetaminophen 10-325 MG tablet Commonly known as:  NORCO Take 1 tablet by mouth every 6 (six) hours as needed.   insulin aspart 100 UNIT/ML FlexPen Commonly known as:  NOVOLOG FLEXPEN Inject 5 to 60 units twice daily with meals as needed per sliding scale.   insulin degludec 100 UNIT/ML Sopn FlexTouch Pen Commonly known as:  TRESIBA FLEXTOUCH Inject 0.24 mLs (24 Units total) into the skin daily.   levofloxacin 500 MG tablet Commonly known as:  LEVAQUIN Take 1 tablet (500 mg total) by mouth daily.   levothyroxine 100 MCG  tablet Commonly known as:  SYNTHROID, LEVOTHROID TAKE ONE TABLET BY MOUTH ONCE DAILY   LORazepam 1 MG tablet Commonly known as:  ATIVAN TAKE 1 TABLET BY MOUTH THREE TIMES DAILY AS NEEDED FOR  ANXIETY   medroxyPROGESTERone 2.5 MG tablet Commonly known as:  PROVERA Take 1 tablet (2.5  mg total) by mouth daily.   metoprolol tartrate 25 MG tablet Commonly known as:  LOPRESSOR TAKE 1 TABLET BY MOUTH TWICE DAILY   ONE TOUCH LANCETS Misc Use to monitor FSBS 1x daily. Dx: E11.9.   predniSONE 20 MG tablet Commonly known as:  DELTASONE Take 2 tablets (40 mg total) by mouth daily with breakfast.   promethazine 25 MG tablet Commonly known as:  PHENERGAN Take 1 tablet (25 mg total) by mouth every 8 (eight) hours as needed for nausea or vomiting.   RELION PEN NEEDLES 31G X 6 MM Misc Generic drug:  Insulin Pen Needle USE AS DIRECTED   Semaglutide 0.25 or 0.5 MG/DOSE Sopn Commonly known as:  OZEMPIC Inject 0.5 mg into the skin once a week.   sertraline 100 MG tablet Commonly known as:  ZOLOFT   valACYclovir 1000 MG tablet Commonly known as:  VALTREX Take 1 tablet (1,000 mg total) by mouth daily.   zolpidem 12.5 MG CR tablet Commonly known as:  AMBIEN CR TAKE 1 TABLET BY MOUTH ONCE DAILY AT BEDTIME AS NEEDED       Allergies:  Allergies  Allergen Reactions  . Erythromycin Other (See Comments)    Stomach cramps  . Imitrex [Sumatriptan] Other (See Comments)    Migraine and increased BP  . Sulfa Antibiotics Other (See Comments)    Causes stomach cramps  . Topamax [Topiramate] Nausea And Vomiting    Nausea and worsening    Past Medical History:  Diagnosis Date  . Anxiety   . Chronic kidney disease   . COPD (chronic obstructive pulmonary disease) ([place])   . Depression   . Diabetes mellitus without complication ([place])   . Eczema   . Graves disease   . HSV-2 infection   . Hyperlipidemia   . Hypertension   . Hypothyroid   . Lung nodule   . Menopausal vaginal dryness  02/24/2014  . Migraines   . Neuropathy   . Tobacco use   . Trouble in sleeping 06/25/2015    Past Surgical History:  Procedure Laterality Date  . CESAREAN SECTION     x 2  . FOOT SURGERY     5th digit left foot / Great toe, 2ND TOE  . THYROID SURGERY      Family History  Problem Relation Age of Onset  . Stroke Mother        x 3  . Cancer Mother        breast   . Thyroid disease Mother   . Hyperlipidemia Mother   . Cancer Father        stomach cancer   . Hypertension Father   . Heart attack Father 36  . Gout Brother   . Migraines Son   . Hypertension Paternal Uncle   . Heart disease Maternal Grandmother   . Heart attack Maternal Grandmother   . Heart disease Maternal Grandfather   . Heart attack Maternal Grandfather   . Cancer Paternal Grandmother   . Diabetes Paternal Grandfather   . Asthma Daughter     Social History:  reports that she has been smoking cigarettes.  She has a 17.50 pack-year smoking history. she has never used smokeless tobacco. She reports that she does not drink alcohol or use drugs.   Review of Systems     Currently not on any antidepressants  NEUROPATHY: She has previously been on Lyrica for symptomatic relief   Lipid history:  on Lipitor 20 mg with adequate control  Lab Results  Component Value Date   CHOL 166 12/18/2016   HDL 44 (L) 12/18/2016   LDLCALC 93 02/08/2016   LDLDIRECT 95 10/04/2015   TRIG 172 (H) 12/18/2016   CHOLHDL 3.8 12/18/2016           Hypertension: mild, on metoprolol 12.5 mg twice a day along with 10 mg amlodipine She has been followed by PCP    BP Readings from Last 3 Encounters:  03/22/17 110/70  02/08/17 128/82  01/23/17 126/72    Most recent eye exam was In 1/17  Most recent foot exam: 8/17  HYPOTHYROIDISM:  She is on long-term supplementation with levothyroxine The dose was increased from 88 up to 100 g previously   TSH levels as below:  Lab Results  Component Value Date   TSH 0.67  01/22/2017   TSH 0.68 08/25/2016   TSH 1.33 05/25/2016   FREET4 1.10 08/25/2016   FREET4 0.88 11/17/2015   FREET4 1.17 03/24/2015      Physical Examination:  BP 110/70 (BP Location: Left Arm, Patient Position: Sitting, Cuff Size: Normal)   Pulse 91   Ht _0  (1.626 m)   Wt 154 lb 6.4 oz (70 kg)   LMP 06/18/2013 Comment: irregular  SpO2 97%   BMI 26.50 kg/m    ASSESSMENT:  Diabetes type 2, insulin requiring See history of present illness for detailed discussion of current diabetes management, blood sugar patterns and problems identified  She is on a regimen of basal bolus insulin and Ozempic  Currently on mostly basal insulin and with sporadic use of Novolog She is not understanding the need for taking mealtime insulin before eating and is taking it only as needed which may sometimes cause hypoglycemia later on and also likely not eating up to 10 units of insulin that she is taking for correction Also probably needs to reduce her TRESIBA because of overnight hypoglycemia  She does want to try Ozempic again at the lower dose to help regulate her blood sugars and reduced need for mealtime insulin This may also help limit her weight gain with insulin  RENAL dysfunction: Followed by PCP and nephrologist   HYPOTHYROID: Will follow-up with her TSH level on the next visit   PLAN:    She will continue 0.25 mg Ozempic for now weekly  She can use Phenergan as needed but let us know if she has excessive anorexia or nausea  TRESIBA will be reduced at least by 2 units and discussed adjustment every week or so based on fasting blood sugars by 2 units  Fasting blood sugar targets 90-1 30  She will try to use calorie Edison Pace to calculate her mealtime dose and try to estimate carbohydrate intake at the same time  For now she can use a 1:10, either coverage and discussed how to calculate this; however she may possibly need more than this especially with higher fat meals which she is  not able to control consistently  She must take NOVOLOG before meals consistently based on her intake   Also may need to adjust her NovoLog based on her meal size and carbohydrate intake and may continue to take 2-3 units  She will continue to use the freestyle [place] system and discuss using skin Tac or an armband she can buy from [employer] to help this stay on consistently Follow-up in 6 weeks  Counseling time on subjects discussed in assessment and plan sections is over 50% of today's 25 minute visit   Patient Instructions  Take 18 Tresiba and adjust weekly  Novolog BEFORE meals Divide carbs by 10 plus 2-4 units for hi fat  Skin Tac or arm band        Elayne Snare 03/22/2017, 8:54 PM   Note: This office note was prepared with Dragon voice recognition system technology. Any transcriptional errors that result from this process are unintentional.

## 2017-03-21 NOTE — Telephone Encounter (Signed)
Ok to refill 

## 2017-03-22 ENCOUNTER — Encounter: Payer: Self-pay | Admitting: Endocrinology

## 2017-03-22 ENCOUNTER — Ambulatory Visit: Payer: Medicaid Other | Admitting: Endocrinology

## 2017-03-22 ENCOUNTER — Other Ambulatory Visit: Payer: Self-pay | Admitting: Family Medicine

## 2017-03-22 VITALS — BP 110/70 | HR 91 | Ht 64.0 in | Wt 154.4 lb

## 2017-03-22 DIAGNOSIS — E063 Autoimmune thyroiditis: Secondary | ICD-10-CM | POA: Diagnosis not present

## 2017-03-22 DIAGNOSIS — Z794 Long term (current) use of insulin: Secondary | ICD-10-CM

## 2017-03-22 DIAGNOSIS — E1165 Type 2 diabetes mellitus with hyperglycemia: Secondary | ICD-10-CM

## 2017-03-22 LAB — POCT GLYCOSYLATED HEMOGLOBIN (HGB A1C): Hemoglobin A1C: 6.8

## 2017-03-22 NOTE — Patient Instructions (Addendum)
Take 18 Tresiba and adjust weekly  Novolog BEFORE meals Divide carbs by 10 plus 2-4 units for hi fat  Skin Tac or arm band

## 2017-03-23 ENCOUNTER — Other Ambulatory Visit: Payer: Self-pay | Admitting: Family Medicine

## 2017-03-23 ENCOUNTER — Telehealth: Payer: Self-pay | Admitting: Endocrinology

## 2017-03-23 MED ORDER — ATORVASTATIN CALCIUM 20 MG PO TABS: 20.0000 mg | ORAL_TABLET | Freq: Every day | ORAL | 0 refills | Status: DC

## 2017-03-23 NOTE — Telephone Encounter (Signed)
[employer]eds called re: preauthorization for [employer]. Please call ph# [phone] to authorize. Ref# [id number] Plan# [id number]

## 2017-03-28 ENCOUNTER — Encounter: Payer: Self-pay | Admitting: Family Medicine

## 2017-03-28 NOTE — Telephone Encounter (Signed)
I called the plan number and spoke with Clair Gulling from [facility] and her PA is approved today through 03-23-2018 and the approval number is #[id number] and the telephone ID number is #[id number]. This first letter is and I, not a number 1 for the ID number.

## 2017-03-29 MED ORDER — INSULIN ASPART 100 UNIT/ML FLEXPEN: PEN_INJECTOR | SUBCUTANEOUS | 3 refills | Status: DC

## 2017-04-04 ENCOUNTER — Other Ambulatory Visit: Payer: Self-pay | Admitting: Endocrinology

## 2017-04-12 ENCOUNTER — Other Ambulatory Visit: Payer: Self-pay | Admitting: Family Medicine

## 2017-04-13 ENCOUNTER — Other Ambulatory Visit: Payer: Self-pay

## 2017-04-13 ENCOUNTER — Encounter: Payer: Self-pay | Admitting: Family Medicine

## 2017-04-13 ENCOUNTER — Ambulatory Visit: Payer: BLUE CROSS/BLUE SHIELD | Admitting: Family Medicine

## 2017-04-13 VITALS — BP 118/78 | HR 84 | Temp 98.3°F | Resp 16 | Ht 64.0 in | Wt 158.0 lb

## 2017-04-13 DIAGNOSIS — R2232 Localized swelling, mass and lump, left upper limb: Secondary | ICD-10-CM

## 2017-04-13 DIAGNOSIS — J019 Acute sinusitis, unspecified: Secondary | ICD-10-CM

## 2017-04-13 MED ORDER — CYCLOBENZAPRINE HCL 10 MG PO TABS: 10.0000 mg | ORAL_TABLET | Freq: Three times a day (TID) | ORAL | 2 refills | Status: DC | PRN

## 2017-04-13 MED ORDER — LEVOFLOXACIN 500 MG PO TABS: 500.0000 mg | ORAL_TABLET | Freq: Every day | ORAL | 0 refills | Status: DC

## 2017-04-13 NOTE — Progress Notes (Signed)
   Subjective:    Patient ID: Kathryn Oconnell, female    DOB: 1965-10-28, 52 y.o.   MRN: [id number]  Patient presents for Illness (x3 days- sinus pressure, nasal draiange, eye drainage) and Knot to Arm (has knot to inner elow that has been there for about a week and has intermittent burning)  Ears sore, nose running, sinus pressure, eye draining clear fluid. Worse on left than the right. For past few days. When last seen by ENT told needs turbinate reduction. Using flonase and asteline  Swelling left upper arm near crease of elnow, gets a sharp pain intermittatnly, no aching, swelling has gone down, no injury to arm , using arm as normally  Last A1C 6.8% with endocrinolgiost      Review Of Systems:  GEN- denies fatigue, fever, weight loss,weakness, recent illness HEENT- denies eye drainage, change in vision, +nasal discharge, CVS- denies chest pain, palpitations RESP- denies SOB, cough, wheeze ABD- denies N/V, change in stools, abd pain GU- denies dysuria, hematuria, dribbling, incontinence MSK- denies joint pain, +muscle aches, injury Neuro- denies headache, dizziness, syncope, seizure activity       Objective:    BP 118/78   Pulse 84   Temp 98.3 F (36.8 C) (Oral)   Resp 16   Ht 5\' 4"  (1.626 m)   Wt 158 lb (71.7 kg)   LMP 06/18/2013 Comment: irregular  SpO2 98%   BMI 27.12 kg/m   GEN- NAD, alert and oriented x3 HEENT- PERRL, EOMI, non injected sclera, pink conjunctiva, MMM, oropharynx mild injection, TM clear bilat no effusion,  + maxillary sinus tenderness, inflammed turbinates,  Nasal drainage  Neck- Supple, no LAD CVS- RRR, no murmur RESP-CTAB EXT- No edema, left arm just above anticubital fossa, small tissue swelling, NT, no erythema, no fluctance, FROM elbow, arm, no effusion Pulses- Radial 2+          Assessment & Plan:      Problem List Items Addressed This Visit    None    Visit Diagnoses    Arm mass, left    -  Primary   Soft tissue swelling,  already improving, ? lipoma or cyst, will use warm compresses, if this does not resolve or worsen, [place] to be done   Acute rhinosinusitis       chronic recurrent sinusitis problems, use nasal sprays per ENT, add levaquiin   Relevant Medications   levofloxacin (LEVAQUIN) 500 MG tablet      Note: This dictation was prepared with Dragon dictation along with smaller phrase technology. Any transcriptional errors that result from this process are unintentional.

## 2017-04-13 NOTE — Patient Instructions (Addendum)
Take antibiotics Use the Asteline spray  Change F/U TO End of Feb

## 2017-04-13 NOTE — Telephone Encounter (Signed)
Ok to refill 

## 2017-04-14 ENCOUNTER — Encounter: Payer: Self-pay | Admitting: Family Medicine

## 2017-04-20 ENCOUNTER — Ambulatory Visit: Payer: BLUE CROSS/BLUE SHIELD | Admitting: Family Medicine

## 2017-04-20 ENCOUNTER — Telehealth: Payer: Self-pay | Admitting: *Deleted

## 2017-04-20 ENCOUNTER — Other Ambulatory Visit: Payer: Self-pay | Admitting: *Deleted

## 2017-04-20 MED ORDER — HYDROCODONE-ACETAMINOPHEN 10-325 MG PO TABS: 1.0000 | ORAL_TABLET | Freq: Four times a day (QID) | ORAL | 0 refills | Status: DC | PRN

## 2017-04-20 NOTE — Telephone Encounter (Signed)
Okay to schedule Ultrasound of arm Dx- Dx soft tissue mass Soft tissue US [facility]

## 2017-04-20 NOTE — Telephone Encounter (Signed)
Order placed

## 2017-04-20 NOTE — Telephone Encounter (Signed)
Received VM from patient.   Reports that mass to L arm is not improving and requested order for [place].   Ok to order?

## 2017-04-20 NOTE — Telephone Encounter (Signed)
Received call from patient.   Requested refill for Hydrocodone/ APAP.   Ok to refill??  Last office visit 04/13/2017.  Last refill 03/21/2017.

## 2017-04-20 NOTE — Telephone Encounter (Signed)
Call placed to patient and patient made aware.  

## 2017-04-20 NOTE — Addendum Note (Signed)
Addended by: Vic Blackbird F on: 04/20/2017 05:13 PM   Modules accepted: Orders

## 2017-04-25 ENCOUNTER — Other Ambulatory Visit: Payer: Self-pay | Admitting: Family Medicine

## 2017-04-26 ENCOUNTER — Ambulatory Visit (HOSPITAL_COMMUNITY): Payer: BLUE CROSS/BLUE SHIELD

## 2017-04-26 NOTE — Telephone Encounter (Signed)
Forward to Dr. Buelah Manis

## 2017-04-26 NOTE — Telephone Encounter (Signed)
Received VM from patient.   Reports that she is completely out of medication.  MD please advise.

## 2017-04-26 NOTE — Telephone Encounter (Signed)
Ok to refill??  Last office visit 04/13/2017.  Last refill 02/06/2017, #1 refill.

## 2017-04-27 ENCOUNTER — Ambulatory Visit (HOSPITAL_COMMUNITY): Admission: RE | Admit: 2017-04-27 | Payer: BLUE CROSS/BLUE SHIELD | Source: Ambulatory Visit

## 2017-04-30 ENCOUNTER — Other Ambulatory Visit: Payer: Self-pay | Admitting: Family Medicine

## 2017-05-01 ENCOUNTER — Encounter: Payer: Self-pay | Admitting: Family Medicine

## 2017-05-01 NOTE — Telephone Encounter (Signed)
Ok to refill??  Last office visit 04/13/2017.  Last refill 12/29/2016, #2 refills.

## 2017-05-04 ENCOUNTER — Other Ambulatory Visit: Payer: Self-pay | Admitting: Endocrinology

## 2017-05-21 ENCOUNTER — Other Ambulatory Visit: Payer: Self-pay | Admitting: Family Medicine

## 2017-05-21 ENCOUNTER — Encounter: Payer: Self-pay | Admitting: Family Medicine

## 2017-05-21 MED ORDER — HYDROCODONE-ACETAMINOPHEN 10-325 MG PO TABS: 1.0000 | ORAL_TABLET | Freq: Four times a day (QID) | ORAL | 0 refills | Status: DC | PRN

## 2017-05-21 NOTE — Telephone Encounter (Signed)
Last OV 04/13/2017 Last refill  04/20/2017 #120 Okay to refill?

## 2017-05-23 ENCOUNTER — Encounter: Payer: Self-pay | Admitting: Endocrinology

## 2017-05-23 ENCOUNTER — Ambulatory Visit (INDEPENDENT_AMBULATORY_CARE_PROVIDER_SITE_OTHER): Payer: BLUE CROSS/BLUE SHIELD | Admitting: Endocrinology

## 2017-05-23 VITALS — BP 138/88 | HR 94 | Ht 64.0 in | Wt 160.8 lb

## 2017-05-23 DIAGNOSIS — E1165 Type 2 diabetes mellitus with hyperglycemia: Secondary | ICD-10-CM

## 2017-05-23 DIAGNOSIS — Z794 Long term (current) use of insulin: Secondary | ICD-10-CM

## 2017-05-23 DIAGNOSIS — E1142 Type 2 diabetes mellitus with diabetic polyneuropathy: Secondary | ICD-10-CM | POA: Diagnosis not present

## 2017-05-23 MED ORDER — PREGABALIN 50 MG PO CAPS: 50.0000 mg | ORAL_CAPSULE | Freq: Two times a day (BID) | ORAL | 1 refills | Status: DC

## 2017-05-23 NOTE — Patient Instructions (Addendum)
TRESIBA 22 to start and adjust based on am sugar

## 2017-05-23 NOTE — Progress Notes (Signed)
Patient ID: Kathryn Oconnell, female   DOB: July 26, 1965, 52 y.o.   MRN: [id number]           Reason for Appointment: Follow-up for Type 2 Diabetes and related problems  Referring physician: Dr. Buelah Manis   History of Present Illness:          Date of diagnosis of type 2 diabetes mellitus: 2000        Background history:   She had been on metformin for a few years but this was stopped in 1/16 when her renal function was impaired. She says she had been doing fairly well with this Trulicity 7.32 mg weekly In 10/16 she had significant hyperglycemia while taking oral hypoglycemic drugs and she was started on insulin She was switched back to oral agents with glipizide and Onglyza in 1/17 However subsequently blood sugars started going up and she was back on insulin in April 2017  Recent history:   INSULIN regimen is:  TRESIBA 24-30 in am, Novolog  8-12 U with  meals  Non-insulin hypoglycemic drugs the patient is taking are: Ozempic 0.5 mg weekly starting 03/21/17  A1c  in December was 6.8  Current management, blood sugar patterns and problems identified:  She was able to start and continue Ozempic with less nausea subsequently after her visit  She has also increased the dose up to 0.5 as she thought the lower dose was not working but she is unclear how long she has been taking this  Her blood sugars were not particularly high on her last visit from her sensor and not clear why she has gone up on her insulin and Ozempic also  However she says she has been craving sweets and snacks and gaining weight  Her blood sugars are variable  She is reportedly eating only one meal a day most of the time  However highest blood sugars are appearing to be before supper  TRESIBA: She says she takes between 24 and 30 units based on her blood sugar level and not adjusting it based on her fasting reading  She does however tend to have hypoglycemia sporadically either overnight or before  suppertime  Usually blood sugars are not significantly high after supper but she says the blood sugar is going up she would take another 4 units of Humalog   Fasting readings are excellent and averaging about 103 Blood sugars may be as low as about 46 she has 16% of her readings below 70       Side effects from medications have been: None  Compliance with the medical regimen: Fair  HYPOGLYCEMIA: May feel shaky when symptomatic, does not always have symptoms with low sugar readings  Glucose monitoring:  done 1 times a day         Glucometer:  Libre Blood Glucose readings from [employer]:  CGM use %  59  Average and SD  127+/-57  Time in range  65   % Time Above 180  19  % Time above 250   % Time Below target  16     Mean values apply above for all meters except median for One Touch  PRE-MEAL Fasting Lunch Dinner Bedtime Overall  Glucose range:       Mean/median:  103  116  147  127   POST-MEAL PC Breakfast PC Lunch PC Dinner  Glucose range:     Mean/median:    132   Self-care: The diet that the patient has been following is:  Reducing portions Meal times are:  Breakfast is at 10 AM, Lunch: 2 Dinner: 7 PM   Typical meal intake: Breakfast is at 10 am, rarely eating fast food biscuits.  Evening meal is meat or chicken with potatoes, greens, macaroni.  Snacks on chips.  She will drink water and some juice, usually diet drinks                Dietician visit, most recent: 6/17               Exercise: walking 3/7 days, about 30 minutes in the evening  Weight history: Previous range 119-196  Wt Readings from Last 3 Encounters:  05/23/17 160 lb 12.8 oz (72.9 kg)  04/13/17 158 lb (71.7 kg)  03/22/17 154 lb 6.4 oz (70 kg)    Glycemic control:   Lab Results  Component Value Date   HGBA1C 6.8 03/22/2017   HGBA1C 7.3 12/18/2016   HGBA1C 7.6 08/25/2016   Lab Results  Component Value Date   MICROALBUR 4.3 (H) 08/25/2016   LDLCALC 93 02/08/2016   CREATININE  1.40 (H) 01/22/2017   Lab Results  Component Value Date   MICRALBCREAT 3.2 08/25/2016    Other active problems discussed today: See review of systems   Allergies as of 05/23/2017      Reactions   Erythromycin Other (See Comments)   Stomach cramps   Imitrex [sumatriptan] Other (See Comments)   Migraine and increased BP   Sulfa Antibiotics Other (See Comments)   Causes stomach cramps   Topamax [topiramate] Nausea And Vomiting   Nausea and worsening      Medication List        Accurate as of 05/23/17 11:34 AM. Always use your most recent med list.          amLODipine 10 MG tablet Commonly known as:  NORVASC Take 0.5 tablets (5 mg total) by mouth daily.   aspirin 81 MG tablet Take 81 mg by mouth daily.   atorvastatin 20 MG tablet Commonly known as:  LIPITOR Take 1 tablet (20 mg total) by mouth daily.   Azelastine-Fluticasone 137-50 MCG/ACT Susp Place into the nose.   chlorhexidine 0.12 % solution Commonly known as:  PERIDEX USE AS DIRECTED 15 MLS IN THE MOUTH OR THROAT TWICE DAILY   ciclopirox 8 % solution Commonly known as:  PENLAC   cyclobenzaprine 10 MG tablet Commonly known as:  FLEXERIL Take 1 tablet (10 mg total) by mouth 3 (three) times daily as needed.   fluticasone 50 MCG/ACT nasal spray Commonly known as:  FLONASE Place 2 sprays into the nose daily. For sinuses/allergies   FREESTYLE LIBRE READER Devi 1 Device by Does not apply route as directed.   FREESTYLE LIBRE SENSOR SYSTEM Misc APPLY TO UPPER ARM AND CHANGE SENSOR EVERY 10 DAYS   glucose blood test strip Commonly known as:  BAYER CONTOUR NEXT TEST Use to monitor FSBS 2x daily. Dx: E11.9.   HYDROcodone-acetaminophen 10-325 MG tablet Commonly known as:  NORCO Take 1 tablet by mouth every 6 (six) hours as needed.   insulin aspart 100 UNIT/ML FlexPen Commonly known as:  NOVOLOG FLEXPEN Inject 5 to 60 units twice daily with meals as needed per sliding scale.   levofloxacin 500 MG  tablet Commonly known as:  LEVAQUIN Take 1 tablet (500 mg total) by mouth daily.   levothyroxine 100 MCG tablet Commonly known as:  SYNTHROID, LEVOTHROID TAKE ONE TABLET BY MOUTH ONCE DAILY   LORazepam 1 MG tablet Commonly  known as:  ATIVAN TAKE 1 TABLET BY MOUTH THREE TIMES DAILY AS NEEDED FOR  ANXIETY   medroxyPROGESTERone 2.5 MG tablet Commonly known as:  PROVERA Take 1 tablet (2.5 mg total) by mouth daily.   metoprolol tartrate 25 MG tablet Commonly known as:  LOPRESSOR TAKE 1 TABLET BY MOUTH TWICE DAILY   ONE TOUCH LANCETS Misc Use to monitor FSBS 1x daily. Dx: E11.9.   promethazine 25 MG tablet Commonly known as:  PHENERGAN Take 1 tablet (25 mg total) by mouth every 8 (eight) hours as needed for nausea or vomiting.   RELION PEN NEEDLES 31G X 6 MM Misc Generic drug:  Insulin Pen Needle USE AS DIRECTED   Semaglutide 0.25 or 0.5 MG/DOSE Sopn Commonly known as:  OZEMPIC Inject 0.5 mg into the skin once a week.   TRESIBA FLEXTOUCH 100 UNIT/ML Sopn FlexTouch Pen Generic drug:  insulin degludec INJECT 24 UNITS INTO THE SKIN DAILY   valACYclovir 1000 MG tablet Commonly known as:  VALTREX Take 1 tablet (1,000 mg total) by mouth daily.   zolpidem 12.5 MG CR tablet Commonly known as:  AMBIEN CR TAKE 1 TABLET BY MOUTH AT BEDTIME AS NEEDED       Allergies:  Allergies  Allergen Reactions  . Erythromycin Other (See Comments)    Stomach cramps  . Imitrex [Sumatriptan] Other (See Comments)    Migraine and increased BP  . Sulfa Antibiotics Other (See Comments)    Causes stomach cramps  . Topamax [Topiramate] Nausea And Vomiting    Nausea and worsening    Past Medical History:  Diagnosis Date  . Anxiety   . Chronic kidney disease   . COPD (chronic obstructive pulmonary disease) ([place])   . Depression   . Diabetes mellitus without complication ([place])   . Eczema   . Graves disease   . HSV-2 infection   . Hyperlipidemia   . Hypertension   . Hypothyroid   .  Lung nodule   . Menopausal vaginal dryness 02/24/2014  . Migraines   . Neuropathy   . Tobacco use   . Trouble in sleeping 06/25/2015    Past Surgical History:  Procedure Laterality Date  . CESAREAN SECTION     x 2  . FOOT SURGERY     5th digit left foot / Great toe, 2ND TOE  . THYROID SURGERY      Family History  Problem Relation Age of Onset  . Stroke Mother        x 3  . Cancer Mother        breast   . Thyroid disease Mother   . Hyperlipidemia Mother   . Cancer Father        stomach cancer   . Hypertension Father   . Heart attack Father 63  . Gout Brother   . Migraines Son   . Hypertension Paternal Uncle   . Heart disease Maternal Grandmother   . Heart attack Maternal Grandmother   . Heart disease Maternal Grandfather   . Heart attack Maternal Grandfather   . Cancer Paternal Grandmother   . Diabetes Paternal Grandfather   . Asthma Daughter     Social History:  reports that she has been smoking cigarettes.  She has a 17.50 pack-year smoking history. she has never used smokeless tobacco. She reports that she does not drink alcohol or use drugs.   Review of Systems     Currently not on any antidepressants but she still is having anxiety and depressed  mood and fatigue as well as craving for sweets  NEUROPATHY: She has previously been on Lyrica for symptomatic relief but was unable to get this authorized through her insurance last year She says she did not benefit from gabapentin and Cymbalta and is having more symptoms of pain in her feet and legs  Lipid history:  on Lipitor 20 mg with adequate control    Lab Results  Component Value Date   CHOL 166 12/18/2016   HDL 44 (L) 12/18/2016   LDLCALC 93 02/08/2016   LDLDIRECT 95 10/04/2015   TRIG 172 (H) 12/18/2016   CHOLHDL 3.8 12/18/2016           Hypertension: mild, on metoprolol 12.5 mg twice a day along with 10 mg amlodipine She has been followed by PCP    BP Readings from Last 3 Encounters:  05/23/17  138/88  04/13/17 118/78  03/22/17 110/70    Most recent eye exam was In 05/2016 Last foot exam 9/18   HYPOTHYROIDISM:  She is on long-term supplementation with levothyroxine The dose was increased from 88 up to 100 g previously  She has been followed by her PCP and is due for follow-up soon  TSH levels as below:  Lab Results  Component Value Date   TSH 0.67 01/22/2017   TSH 0.68 08/25/2016   TSH 1.33 05/25/2016   FREET4 1.10 08/25/2016   FREET4 0.88 11/17/2015   FREET4 1.17 03/24/2015      Physical Examination:  BP 138/88 (BP Location: Left Arm, Patient Position: Sitting, Cuff Size: Normal)   Pulse 94   Ht 5\' 4"  (1.626 m)   Wt 160 lb 12.8 oz (72.9 kg)   LMP 06/18/2013 Comment: irregular  SpO2 98%   BMI 27.60 kg/m    ASSESSMENT:  Diabetes type 2, insulin requiring See history of present illness for detailed discussion of current diabetes management, blood sugar patterns and problems identified  She is on a regimen of basal bolus insulin and Ozempic, now 0.5 mg weekly  Even with her taking Ozempic she is still not able to watch her diet and is more likely to be from stress eating and her depression This is causing periods of hypoglycemia especially in the afternoons and evenings which are inconsistent  She thinks she is benefiting from Ozempic overall blood sugar control with has not been able to reduce her insulin requirement Judging from her pre-meal blood sugars and overnight readings she is getting too much TRESIBA and especially with her adjusting this arbitrarily between 24 up to 30 units almost on a daily basis  NEUROPATHY: She is any significant symptoms, currently only taking narcotics for this and thinks she may be able to benefit from the Lyrica although she did not tolerate 150 mg dose in the past This was denied by her insurance last year  RENAL dysfunction: Followed by PCP and nephrologist   HYPOTHYROID: Will follow-up with her TSH level on the  next visit   PLAN:    She will continue  0.50 mg Ozempic   She will cut down her Tresiba down to 22 units to avoid hypoglycemia and take this consistently at least for 3 days  Detailed instructions were given with the help of her flowsheet that was provided on adjusting the dose every 3 days based on fasting blood sugar and a target of 130  She will need to try and cut back on her stress eating  She will ask her PCP for antidepressant again to help with  this  She will not take any excessive NovoLog close to bedtime  She will continue to adjust the Novolog based on meal size and pre-meal blood sugar  New prescription for Lyrica will be sent and PA done if needed, to start but she can try 50 mg twice a day with the first dose at bedtime  Continued use the freestyle Libre sensor which appears to be reportedly fairly accurate  Regular exercise when able to Follow-up in 6 weeks  Counseling time on subjects related to diabetes and neuropathy discussed in assessment and plan sections is over 50% of today's 25 minute visit   Patient Instructions  TRESIBA 22 to start and adjust based on am sugar        Elayne Snare 05/23/2017, 11:34 AM   Note: This office note was prepared with Dragon voice recognition system technology. Any transcriptional errors that result from this process are unintentional.

## 2017-05-30 ENCOUNTER — Encounter: Payer: Self-pay | Admitting: Family Medicine

## 2017-05-30 ENCOUNTER — Other Ambulatory Visit: Payer: Self-pay | Admitting: Endocrinology

## 2017-05-30 ENCOUNTER — Other Ambulatory Visit: Payer: Self-pay

## 2017-05-30 ENCOUNTER — Ambulatory Visit (INDEPENDENT_AMBULATORY_CARE_PROVIDER_SITE_OTHER): Payer: BLUE CROSS/BLUE SHIELD | Admitting: Family Medicine

## 2017-05-30 VITALS — BP 130/76 | HR 62 | Temp 98.6°F | Resp 16 | Ht 64.0 in | Wt 155.0 lb

## 2017-05-30 DIAGNOSIS — F331 Major depressive disorder, recurrent, moderate: Secondary | ICD-10-CM | POA: Diagnosis not present

## 2017-05-30 DIAGNOSIS — I10 Essential (primary) hypertension: Secondary | ICD-10-CM | POA: Diagnosis not present

## 2017-05-30 DIAGNOSIS — N183 Chronic kidney disease, stage 3 unspecified: Secondary | ICD-10-CM

## 2017-05-30 DIAGNOSIS — E118 Type 2 diabetes mellitus with unspecified complications: Secondary | ICD-10-CM | POA: Diagnosis not present

## 2017-05-30 DIAGNOSIS — E782 Mixed hyperlipidemia: Secondary | ICD-10-CM | POA: Diagnosis not present

## 2017-05-30 DIAGNOSIS — M79675 Pain in left toe(s): Secondary | ICD-10-CM

## 2017-05-30 MED ORDER — SERTRALINE HCL 25 MG PO TABS: 25.0000 mg | ORAL_TABLET | Freq: Every day | ORAL | 3 refills | Status: DC

## 2017-05-30 MED ORDER — CYCLOBENZAPRINE HCL 10 MG PO TABS: 10.0000 mg | ORAL_TABLET | Freq: Three times a day (TID) | ORAL | 2 refills | Status: DC | PRN

## 2017-05-30 NOTE — Assessment & Plan Note (Signed)
Diabetes per endocrine

## 2017-05-30 NOTE — Assessment & Plan Note (Signed)
Recheck lipids, on statin

## 2017-05-30 NOTE — Patient Instructions (Signed)
F/U 1 month

## 2017-05-30 NOTE — Progress Notes (Signed)
   Subjective:    Patient ID: Kathryn Oconnell, female    DOB: 1965/10/20, 52 y.o.   MRN: [id number]  Patient presents for Follow-up (is fasting) and L Toe Pain (redness and tenderness to L 4th toe)     Depression- Fired from Job 1st week of of Feb, she is interviewing for a new one next week. Her endocrinologist noticed her weight going up, she has been eating emotionally recently, son also having issues at school and with his depression. She was on zoloft from [facility] but came off when she lost insurance   DM- followed by endocrine  Hyperlipidemia on statin drug, due for repeat labs  Left foot 4th toe, no injury but has a red area that is sore, She did wears some high heels a few days ago, does not remember stumping her toe    Review Of Systems:  GEN- denies fatigue, fever, weight loss,weakness, recent illness HEENT- denies eye drainage, change in vision, nasal discharge, CVS- denies chest pain, palpitations RESP- denies SOB, cough, wheeze ABD- denies N/V, change in stools, abd pain GU- denies dysuria, hematuria, dribbling, incontinence MSK- + joint pain, muscle aches, injury Neuro- denies headache, dizziness, syncope, seizure activity       Objective:    BP 130/76   Pulse 62   Temp 98.6 F (37 C) (Oral)   Resp 16   Ht 5\' 4"  (1.626 m)   Wt 155 lb (70.3 kg)   LMP 06/18/2013 Comment: irregular  SpO2 98%   BMI 26.61 kg/m  GEN- NAD, alert and oriented x3 HEENT- PERRL, EOMI, non injected sclera, pink conjunctiva, MMM, oropharynx clear Neck- Supple, no thyromegaly CVS- RRR, no murmur RESP-CTAB Psych- normal affect and mood Ext- no edema left foot 4th digit just above DIP erythema with mild bruise appearance, mild TTP over same region, nail intact, no openings or cuts, corn on same toe, callus sole, medial edge scab, no erythema  Pulses- Radial, DP- 2+        Assessment & Plan:      Problem List Items Addressed This Visit      Unprioritized   CKD (chronic  kidney disease) stage 3, GFR 30-59 ml/min ([facility])   Hyperlipidemia    Recheck lipids, on statin      Relevant Orders   Lipid panel   Essential hypertension, benign    bp controlled      Diabetes mellitus ([place]) - Primary    Diabetes per endocrine       Relevant Orders   Comprehensive metabolic panel    Other Visit Diagnoses    Pain of toe of left foot       this does not appear to be cellulitis or infeciton, more bruising, no injury and able to weight bear without difficulty, epson salt soak, can buddy tape for comfort      Note: This dictation was prepared with Dragon dictation along with smaller phrase technology. Any transcriptional errors that result from this process are unintentional.

## 2017-05-30 NOTE — Assessment & Plan Note (Signed)
bp controlled   °

## 2017-05-30 NOTE — Assessment & Plan Note (Signed)
Start zoloft 25mg  once a day  Continue ativan

## 2017-05-31 LAB — COMPREHENSIVE METABOLIC PANEL
AG Ratio: 1.3 (calc) (ref 1.0–2.5)
ALT: 20 U/L (ref 6–29)
AST: 26 U/L (ref 10–35)
Albumin: 3.9 g/dL (ref 3.6–5.1)
Alkaline phosphatase (APISO): 76 U/L (ref 33–130)
BUN/Creatinine Ratio: 16 (calc) (ref 6–22)
BUN: 20 mg/dL (ref 7–25)
CALCIUM: 9.5 mg/dL (ref 8.6–10.4)
CHLORIDE: 106 mmol/L (ref 98–110)
CO2: 22 mmol/L (ref 20–32)
Creat: 1.29 mg/dL — ABNORMAL HIGH (ref 0.50–1.05)
GLOBULIN: 3 g/dL (ref 1.9–3.7)
Glucose, Bld: 185 mg/dL — ABNORMAL HIGH (ref 65–99)
Potassium: 4.7 mmol/L (ref 3.5–5.3)
Sodium: 136 mmol/L (ref 135–146)
Total Bilirubin: 0.4 mg/dL (ref 0.2–1.2)
Total Protein: 6.9 g/dL (ref 6.1–8.1)

## 2017-05-31 LAB — EXTRA LAV TOP TUBE

## 2017-05-31 LAB — LIPID PANEL
Cholesterol: 189 mg/dL (ref <200)
HDL: 47 mg/dL — ABNORMAL LOW (ref >50)
LDL Cholesterol (Calc): 126 mg/dL (calc) — ABNORMAL HIGH
Non-HDL Cholesterol (Calc): 142 mg/dL (calc) — ABNORMAL HIGH (ref <130)
TRIGLYCERIDES: 69 mg/dL (ref <150)
Total CHOL/HDL Ratio: 4 (calc) (ref <5.0)

## 2017-06-01 ENCOUNTER — Other Ambulatory Visit: Payer: Self-pay | Admitting: *Deleted

## 2017-06-01 MED ORDER — ATORVASTATIN CALCIUM 40 MG PO TABS: 40.0000 mg | ORAL_TABLET | Freq: Every day | ORAL | 3 refills | Status: DC

## 2017-06-15 ENCOUNTER — Other Ambulatory Visit: Payer: Self-pay | Admitting: Family Medicine

## 2017-06-15 ENCOUNTER — Other Ambulatory Visit: Payer: Self-pay | Admitting: Endocrinology

## 2017-06-18 ENCOUNTER — Encounter: Payer: Self-pay | Admitting: Family Medicine

## 2017-06-18 ENCOUNTER — Other Ambulatory Visit: Payer: Self-pay | Admitting: Family Medicine

## 2017-06-18 MED ORDER — HYDROCODONE-ACETAMINOPHEN 10-325 MG PO TABS: 1.0000 | ORAL_TABLET | Freq: Four times a day (QID) | ORAL | 0 refills | Status: DC | PRN

## 2017-06-18 NOTE — Telephone Encounter (Signed)
Received e- mail from patient.   Requested refill on Hydrocodone/ APAP.   Ok to refill??  Last office visit 05/30/2017.  Last refill  05/21/2017.

## 2017-06-22 ENCOUNTER — Telehealth: Payer: Self-pay | Admitting: *Deleted

## 2017-06-22 DIAGNOSIS — M79675 Pain in left toe(s): Secondary | ICD-10-CM

## 2017-06-22 NOTE — Telephone Encounter (Signed)
Received call from patient.   Reports that she was seen in office on 05/30/2017 for L 4th toe pain. States that she has been able to bear weight and has been using epsom salt soaks and using buddy tape for comfort while walking. Reports that digit remains swollen and tender.   Requested MD to advise.

## 2017-06-22 NOTE — Telephone Encounter (Signed)
Xray of foot can be done

## 2017-06-22 NOTE — Telephone Encounter (Signed)
Call placed to patient and patient made aware.   Patient will go to [facility] for X-ray today.

## 2017-06-25 ENCOUNTER — Ambulatory Visit (HOSPITAL_COMMUNITY)
Admission: RE | Admit: 2017-06-25 | Discharge: 2017-06-25 | Disposition: A | Discharge status: Home / Self Care | Payer: BLUE CROSS/BLUE SHIELD | Source: Ambulatory Visit | Attending: Family Medicine | Admitting: Family Medicine

## 2017-06-25 DIAGNOSIS — M79675 Pain in left toe(s): Secondary | ICD-10-CM | POA: Insufficient documentation

## 2017-06-25 DIAGNOSIS — M25475 Effusion, left foot: Secondary | ICD-10-CM | POA: Diagnosis not present

## 2017-06-27 ENCOUNTER — Encounter: Payer: Self-pay | Admitting: Family Medicine

## 2017-06-27 ENCOUNTER — Ambulatory Visit (INDEPENDENT_AMBULATORY_CARE_PROVIDER_SITE_OTHER): Payer: BLUE CROSS/BLUE SHIELD | Admitting: Family Medicine

## 2017-06-27 ENCOUNTER — Other Ambulatory Visit: Payer: Self-pay

## 2017-06-27 VITALS — BP 134/68 | HR 82 | Temp 97.9°F | Resp 14 | Ht 64.0 in | Wt 161.0 lb

## 2017-06-27 DIAGNOSIS — E039 Hypothyroidism, unspecified: Secondary | ICD-10-CM | POA: Diagnosis not present

## 2017-06-27 DIAGNOSIS — M7989 Other specified soft tissue disorders: Secondary | ICD-10-CM | POA: Diagnosis not present

## 2017-06-27 DIAGNOSIS — F331 Major depressive disorder, recurrent, moderate: Secondary | ICD-10-CM

## 2017-06-27 MED ORDER — SERTRALINE HCL 50 MG PO TABS: 50.0000 mg | ORAL_TABLET | Freq: Every day | ORAL | 6 refills | Status: DC

## 2017-06-27 NOTE — Progress Notes (Signed)
   Subjective:    Patient ID: Kathryn Oconnell, female    DOB: 04-09-1965, 52 y.o.   MRN: [id number]  Patient presents for Follow-up (toe pain- x-ray performed); Medication Management (wants to try to get freestyle libre); and Handicap Parking Placcard  Pt here for intermin f/u from last visit 1 month ago. Restarted on zoloft 25mg  it is helping but thinks she could use a higher dose. She is now working which has eased some stress. No SE with the medication. 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 No redness or drainage  she does have foot doctor  Asked about the new senor meter for dm, recommend she discuss with endocrinology  Declined handicap placard she does not meet criteria   Weight up, states she is eating more, but not that much to gain 6lbs  Review Of Systems:  GEN- denies fatigue, fever, weight loss,weakness, recent illness HEENT- denies eye drainage, change in vision, nasal discharge, CVS- denies chest pain, palpitations RESP- denies SOB, cough, wheeze ABD- denies N/V, change in stools, abd pain GU- denies dysuria, hematuria, dribbling, incontinence MSK- + joint pain, muscle aches, injury Neuro- denies headache, dizziness, syncope, seizure activity       Objective:    BP 134/68   Pulse 82   Temp 97.9 F (36.6 C) (Oral)   Resp 14   Ht 5\' 4"  (1.626 m)   Wt 161 lb (73 kg)   LMP 06/18/2013 Comment: irregular  SpO2 98%   BMI 27.64 kg/m  GEN- NAD, alert and oriented x3 CVS- RRR, no murmur RESP-CTAB Psych- normal affect and mood Ext-  left foot 4th digit just above DIP swelling of toe, mild erythema, toe warm, no necrosis, corn on toe normal ROM Pulses- Radial, DP- 2+        Assessment & Plan:      Problem List Items Addressed This Visit      Unprioritized   Hypothyroidism - Primary    Repeat TFT, she has gained 6lbs in past  month Discussed dietary habits      Relevant Orders   TSH (Completed)   T3, free (Completed)   T4, free (Completed)   Depression    Increase zoloft to 50mg  , continue ativan      Relevant Medications   sertraline (ZOLOFT) 50 MG tablet    Other Visit Diagnoses    Toe swelling       No color change to suggest thrombotic event at this time, possible gout, but not severely painful, no fracture, will have her see podiatry   Relevant Orders   Uric Acid (Completed)      Note: This dictation was prepared with Dragon dictation along with smaller phrase technology. Any transcriptional errors that result from this process are unintentional.

## 2017-06-27 NOTE — Patient Instructions (Signed)
F/U 4 months  Zoloft increased to 50mg 

## 2017-06-28 ENCOUNTER — Encounter: Payer: Self-pay | Admitting: Family Medicine

## 2017-06-28 LAB — EXTRA LAV TOP TUBE

## 2017-06-28 LAB — T4, FREE: Free T4: 1.1 ng/dL (ref 0.8–1.8)

## 2017-06-28 LAB — T3, FREE: T3, Free: 2.3 pg/mL (ref 2.3–4.2)

## 2017-06-28 LAB — TSH: TSH: 0.63 mIU/L

## 2017-06-28 LAB — URIC ACID: Uric Acid, Serum: 6.2 mg/dL (ref 2.5–7.0)

## 2017-06-28 NOTE — Assessment & Plan Note (Signed)
Increase zoloft to 50mg  , continue ativan

## 2017-06-28 NOTE — Assessment & Plan Note (Addendum)
Repeat TFT, she has gained 6lbs in past month Discussed dietary habits

## 2017-06-29 ENCOUNTER — Other Ambulatory Visit: Payer: Self-pay | Admitting: Family Medicine

## 2017-06-29 ENCOUNTER — Encounter: Payer: Self-pay | Admitting: Family Medicine

## 2017-06-29 MED ORDER — SERTRALINE HCL 50 MG PO TABS: 50.0000 mg | ORAL_TABLET | Freq: Every day | ORAL | 6 refills | Status: DC

## 2017-06-29 NOTE — Telephone Encounter (Signed)
Ok to refill??  Last office visit 06/27/2017.  Last refill 04/27/2017.

## 2017-07-05 ENCOUNTER — Telehealth: Payer: Self-pay

## 2017-07-05 ENCOUNTER — Encounter: Payer: Self-pay | Admitting: Endocrinology

## 2017-07-05 NOTE — Telephone Encounter (Signed)
Spoke with [employer] for approval for  Lyrica 50mg  PO BID. Medication was approved. Approval # is [id number] and is good until 06/30/2018.

## 2017-07-06 ENCOUNTER — Telehealth: Payer: Self-pay

## 2017-07-06 NOTE — Telephone Encounter (Signed)
error 

## 2017-07-17 ENCOUNTER — Encounter: Payer: Self-pay | Admitting: Family Medicine

## 2017-07-17 MED ORDER — HYDROCODONE-ACETAMINOPHEN 10-325 MG PO TABS: 1.0000 | ORAL_TABLET | Freq: Four times a day (QID) | ORAL | 0 refills | Status: DC | PRN

## 2017-07-17 NOTE — Telephone Encounter (Signed)
Ok to refill??  Last office visit 06/27/2017.  Last refill 06/18/2017.

## 2017-07-22 NOTE — Progress Notes (Deleted)
Patient ID: Kathryn Oconnell, female   DOB: Oct 03, 1965, 52 y.o.   MRN: [id number]           Reason for Appointment: Follow-up for Type 2 Diabetes and related problems  Referring physician: Dr. Buelah Manis   History of Present Illness:          Date of diagnosis of type 2 diabetes mellitus: 2000        Background history:   She had been on metformin for a few years but this was stopped in 1/16 when her renal function was impaired. She says she had been doing fairly well with this Trulicity 0.96 mg weekly In 10/16 she had significant hyperglycemia while taking oral hypoglycemic drugs and she was started on insulin She was switched back to oral agents with glipizide and Onglyza in 1/17 However subsequently blood sugars started going up and she was back on insulin in April 2017  Recent history:   INSULIN regimen is:  TRESIBA 24-30 in am, Novolog  8-12 U with  meals  Non-insulin hypoglycemic drugs the patient is taking are: Ozempic 0.5 mg weekly starting 03/21/17  A1c  in December was 6.8  Current management, blood sugar patterns and problems identified:  She was able to start and continue Ozempic with less nausea subsequently after her visit  She has also increased the dose up to 0.5 as she thought the lower dose was not working but she is unclear how long she has been taking this  Her blood sugars were not particularly high on her last visit from her sensor and not clear why she has gone up on her insulin and Ozempic also  However she says she has been craving sweets and snacks and gaining weight  Her blood sugars are variable  She is reportedly eating only one meal a day most of the time  However highest blood sugars are appearing to be before supper  TRESIBA: She says she takes between 24 and 30 units based on her blood sugar level and not adjusting it based on her fasting reading  She does however tend to have hypoglycemia sporadically either overnight or before  suppertime  Usually blood sugars are not significantly high after supper but she says the blood sugar is going up she would take another 4 units of Humalog   Fasting readings are excellent and averaging about 103 Blood sugars may be as low as about 46 she has 16% of her readings below 70       Side effects from medications have been: None  Compliance with the medical regimen: Fair  HYPOGLYCEMIA: May feel shaky when symptomatic, does not always have symptoms with low sugar readings  Glucose monitoring:  done 1 times a day         Glucometer:  Libre Blood Glucose readings from [employer]:  CGM use %  59  Average and SD  127+/-57  Time in range  65   % Time Above 180  19  % Time above 250   % Time Below target  16     Mean values apply above for all meters except median for One Touch  PRE-MEAL Fasting Lunch Dinner Bedtime Overall  Glucose range:       Mean/median:  103  116  147  127   POST-MEAL PC Breakfast PC Lunch PC Dinner  Glucose range:     Mean/median:    132   Self-care: The diet that the patient has been following is:  Reducing portions Meal times are:  Breakfast is at 10 AM, Lunch: 2 Dinner: 7 PM   Typical meal intake: Breakfast is at 10 am, rarely eating fast food biscuits.  Evening meal is meat or chicken with potatoes, greens, macaroni.  Snacks on chips.  She will drink water and some juice, usually diet drinks                Dietician visit, most recent: 6/17               Exercise: walking 3/7 days, about 30 minutes in the evening  Weight history: Previous range 119-196  Wt Readings from Last 3 Encounters:  06/27/17 161 lb (73 kg)  05/30/17 155 lb (70.3 kg)  05/23/17 160 lb 12.8 oz (72.9 kg)    Glycemic control:   Lab Results  Component Value Date   HGBA1C 6.8 03/22/2017   HGBA1C 7.3 12/18/2016   HGBA1C 7.6 08/25/2016   Lab Results  Component Value Date   MICROALBUR 4.3 (H) 08/25/2016   LDLCALC 126 (H) 05/30/2017   CREATININE  1.29 (H) 05/30/2017   Lab Results  Component Value Date   MICRALBCREAT 3.2 08/25/2016    Other active problems discussed today: See review of systems   Allergies as of 07/23/2017      Reactions   Erythromycin Other (See Comments)   Stomach cramps   Imitrex [sumatriptan] Other (See Comments)   Migraine and increased BP   Sulfa Antibiotics Other (See Comments)   Causes stomach cramps   Topamax [topiramate] Nausea And Vomiting   Nausea and worsening      Medication List        Accurate as of 07/22/17  9:09 PM. Always use your most recent med list.          amLODipine 10 MG tablet Commonly known as:  NORVASC Take 0.5 tablets (5 mg total) by mouth daily.   aspirin 81 MG tablet Take 81 mg by mouth daily.   atorvastatin 40 MG tablet Commonly known as:  LIPITOR Take 1 tablet (40 mg total) by mouth daily.   Azelastine-Fluticasone 137-50 MCG/ACT Susp Place into the nose.   chlorhexidine 0.12 % solution Commonly known as:  PERIDEX USE AS DIRECTED 15 MLS IN THE MOUTH OR THROAT TWICE DAILY   ciclopirox 8 % solution Commonly known as:  PENLAC   cyclobenzaprine 10 MG tablet Commonly known as:  FLEXERIL Take 1 tablet (10 mg total) by mouth 3 (three) times daily as needed.   fluticasone 50 MCG/ACT nasal spray Commonly known as:  FLONASE Place 2 sprays into the nose daily. For sinuses/allergies   FREESTYLE LIBRE READER Devi 1 Device by Does not apply route as directed.   FREESTYLE LIBRE SENSOR SYSTEM Misc APPLY TO UPPER ARM AND CHANGE SENSOR EVERY 10 DAYS   glucose blood test strip Commonly known as:  BAYER CONTOUR NEXT TEST Use to monitor FSBS 2x daily. Dx: E11.9.   HYDROcodone-acetaminophen 10-325 MG tablet Commonly known as:  NORCO Take 1 tablet by mouth every 6 (six) hours as needed.   insulin aspart 100 UNIT/ML FlexPen Commonly known as:  NOVOLOG FLEXPEN Inject 5 to 60 units twice daily with meals as needed per sliding scale.   levothyroxine 100 MCG  tablet Commonly known as:  SYNTHROID, LEVOTHROID TAKE ONE TABLET BY MOUTH ONCE DAILY   LORazepam 1 MG tablet Commonly known as:  ATIVAN TAKE 1 TABLET BY MOUTH THREE TIMES DAILY AS NEEDED FOR ANXIETY   medroxyPROGESTERone 2.5  MG tablet Commonly known as:  PROVERA Take 1 tablet (2.5 mg total) by mouth daily.   metoprolol tartrate 25 MG tablet Commonly known as:  LOPRESSOR TAKE 1 TABLET BY MOUTH TWICE DAILY   ONE TOUCH LANCETS Misc Use to monitor FSBS 1x daily. Dx: E11.9.   OZEMPIC 0.25 or 0.5 MG/DOSE Sopn Generic drug:  Semaglutide INJECT 0.5 MG INTO THE SKIN ONCE A WEEK   pregabalin 50 MG capsule Commonly known as:  LYRICA Take 1 capsule (50 mg total) by mouth 2 (two) times daily.   promethazine 25 MG tablet Commonly known as:  PHENERGAN TAKE 1 TABLET BY MOUTH EVERY 8 HOURS AS NEEDED FOR NAUSEA OR VOMITING   RELION PEN NEEDLES 31G X 6 MM Misc Generic drug:  Insulin Pen Needle USE AS DIRECTED   sertraline 50 MG tablet Commonly known as:  ZOLOFT Take 1 tablet (50 mg total) by mouth daily.   TRESIBA FLEXTOUCH 100 UNIT/ML Sopn FlexTouch Pen Generic drug:  insulin degludec INJECT 24 UNITS INTO THE SKIN DAILY   valACYclovir 1000 MG tablet Commonly known as:  VALTREX Take 1 tablet (1,000 mg total) by mouth daily.   zolpidem 12.5 MG CR tablet Commonly known as:  AMBIEN CR TAKE 1 TABLET BY MOUTH AT BEDTIME AS NEEDED       Allergies:  Allergies  Allergen Reactions  . Erythromycin Other (See Comments)    Stomach cramps  . Imitrex [Sumatriptan] Other (See Comments)    Migraine and increased BP  . Sulfa Antibiotics Other (See Comments)    Causes stomach cramps  . Topamax [Topiramate] Nausea And Vomiting    Nausea and worsening    Past Medical History:  Diagnosis Date  . Anxiety   . Chronic kidney disease   . COPD (chronic obstructive pulmonary disease) ([place])   . Depression   . Diabetes mellitus without complication ([place])   . Eczema   . Graves disease   .  HSV-2 infection   . Hyperlipidemia   . Hypertension   . Hypothyroid   . Lung nodule   . Menopausal vaginal dryness 02/24/2014  . Migraines   . Neuropathy   . Tobacco use   . Trouble in sleeping 06/25/2015    Past Surgical History:  Procedure Laterality Date  . CESAREAN SECTION     x 2  . FOOT SURGERY     5th digit left foot / Great toe, 2ND TOE  . THYROID SURGERY      Family History  Problem Relation Age of Onset  . Stroke Mother        x 3  . Cancer Mother        breast   . Thyroid disease Mother   . Hyperlipidemia Mother   . Cancer Father        stomach cancer   . Hypertension Father   . Heart attack Father 56  . Gout Brother   . Migraines Son   . Hypertension Paternal Uncle   . Heart disease Maternal Grandmother   . Heart attack Maternal Grandmother   . Heart disease Maternal Grandfather   . Heart attack Maternal Grandfather   . Cancer Paternal Grandmother   . Diabetes Paternal Grandfather   . Asthma Daughter     Social History:  reports that she has been smoking cigarettes.  She has a 17.50 pack-year smoking history. She has never used smokeless tobacco. She reports that she does not drink alcohol or use drugs.   Review of Systems  Currently not on any antidepressants but she still is having anxiety and depressed mood and fatigue as well as craving for sweets  NEUROPATHY: She has previously been on Lyrica for symptomatic relief but was unable to get this authorized through her insurance last year She says she did not benefit from gabapentin and Cymbalta and is having more symptoms of pain in her feet and legs  Lipid history:  on Lipitor 20 mg with adequate control    Lab Results  Component Value Date   CHOL 189 05/30/2017   HDL 47 (L) 05/30/2017   LDLCALC 126 (H) 05/30/2017   LDLDIRECT 95 10/04/2015   TRIG 69 05/30/2017   CHOLHDL 4.0 05/30/2017           Hypertension: mild, on metoprolol 12.5 mg twice a day along with 10 mg amlodipine She  has been followed by PCP    BP Readings from Last 3 Encounters:  06/27/17 134/68  05/30/17 130/76  05/23/17 138/88    Most recent eye exam was In 05/2016 Last foot exam 9/18   HYPOTHYROIDISM:  She is on long-term supplementation with levothyroxine The dose was increased from 88 up to 100 g previously  She has been followed by her PCP and is due for follow-up soon  TSH levels as below:  Lab Results  Component Value Date   TSH 0.63 06/27/2017   TSH 0.67 01/22/2017   TSH 0.68 08/25/2016   FREET4 1.1 06/27/2017   FREET4 1.10 08/25/2016   FREET4 0.88 11/17/2015      Physical Examination:  LMP 06/18/2013 Comment: irregular   ASSESSMENT:  Diabetes type 2, insulin requiring See history of present illness for detailed discussion of current diabetes management, blood sugar patterns and problems identified  She is on a regimen of basal bolus insulin and Ozempic, now 0.5 mg weekly  Even with her taking Ozempic she is still not able to watch her diet and is more likely to be from stress eating and her depression This is causing periods of hypoglycemia especially in the afternoons and evenings which are inconsistent  She thinks she is benefiting from [place] overall blood sugar control with has not been able to reduce her insulin requirement Judging from her pre-meal blood sugars and overnight readings she is getting too much TRESIBA and especially with her adjusting this arbitrarily between 24 up to 30 units almost on a daily basis  NEUROPATHY: She is any significant symptoms, currently only taking narcotics for this and thinks she may be able to benefit from the Lyrica although she did not tolerate 150 mg dose in the past This was denied by her insurance last year  RENAL dysfunction: Followed by PCP and nephrologist   HYPOTHYROID: Will follow-up with her TSH level on the next visit   PLAN:    She will continue  0.50 mg Ozempic   She will cut down her Tresiba down to  22 units to avoid hypoglycemia and take this consistently at least for 3 days  Detailed instructions were given with the help of her flowsheet that was provided on adjusting the dose every 3 days based on fasting blood sugar and a target of 130  She will need to try and cut back on her stress eating  She will ask her PCP for antidepressant again to help with this  She will not take any excessive NovoLog close to bedtime  She will continue to adjust the Novolog based on meal size and pre-meal blood sugar  New  prescription for Lyrica will be sent and PA done if needed, to start but she can try 50 mg twice a day with the first dose at bedtime  Continued use the freestyle Libre sensor which appears to be reportedly fairly accurate  Regular exercise when able to Follow-up in 6 weeks  Counseling time on subjects related to diabetes and neuropathy discussed in assessment and plan sections is over 50% of today's 25 minute visit   There are no Patient Instructions on file for this visit.       Elayne Snare 07/22/2017, 9:09 PM   Note: This office note was prepared with Dragon voice recognition system technology. Any transcriptional errors that result from this process are unintentional.

## 2017-07-23 ENCOUNTER — Ambulatory Visit: Payer: Self-pay | Admitting: Endocrinology

## 2017-07-23 DIAGNOSIS — Z2089 Contact with and (suspected) exposure to other communicable diseases: Secondary | ICD-10-CM

## 2017-08-09 ENCOUNTER — Telehealth: Payer: Self-pay | Admitting: *Deleted

## 2017-08-09 NOTE — Telephone Encounter (Signed)
PA form for Freestyle LIbre 14 day sensors partially completed and given to MD for signature/completion.   PA # [id number] Member ID: [id number]  [email or web address]

## 2017-08-14 ENCOUNTER — Other Ambulatory Visit: Payer: Self-pay | Admitting: Endocrinology

## 2017-08-14 ENCOUNTER — Other Ambulatory Visit: Payer: Self-pay | Admitting: Adult Health

## 2017-08-14 NOTE — Telephone Encounter (Signed)
Please refill if appropriate

## 2017-08-15 ENCOUNTER — Telehealth: Payer: Self-pay | Admitting: Endocrinology

## 2017-08-15 ENCOUNTER — Other Ambulatory Visit: Payer: Self-pay

## 2017-08-15 MED ORDER — SEMAGLUTIDE(0.25 OR 0.5MG/DOS) 2 MG/1.5ML ~~LOC~~ SOPN: 0.5000 mg | PEN_INJECTOR | SUBCUTANEOUS | 3 refills | Status: DC

## 2017-08-15 MED ORDER — SEMAGLUTIDE(0.25 OR 0.5MG/DOS) 2 MG/1.5ML ~~LOC~~ SOPN: 0.5000 mg | PEN_INJECTOR | Freq: Every day | SUBCUTANEOUS | 3 refills | Status: DC

## 2017-08-15 MED ORDER — SEMAGLUTIDE(0.25 OR 0.5MG/DOS) 2 MG/1.5ML ~~LOC~~ SOPN: 0.5000 mg | PEN_INJECTOR | Freq: Every day | SUBCUTANEOUS | 2 refills | Status: DC

## 2017-08-15 NOTE — Telephone Encounter (Signed)
Aaron Edelman form the drug store called need clarification of medication Ozempic, need a 90 day supply  Please advise [place], [place] - [place] [phone] (Phone) [phone] (Fax)

## 2017-08-15 NOTE — Telephone Encounter (Signed)
The [facility] called in regards to patients ozempic.  They need the directions and quality changed  They stated this was sent in as injecting daily and it is a weekly medication.  They also needed the quality changed to 4.5ML    Semaglutide (OZEMPIC) 0.25 or 0.5 MG/DOSE [place], [place] - [place]

## 2017-08-15 NOTE — Telephone Encounter (Signed)
Correct dosing sent.

## 2017-08-17 ENCOUNTER — Other Ambulatory Visit: Payer: Self-pay | Admitting: Family Medicine

## 2017-08-17 ENCOUNTER — Encounter: Payer: Self-pay | Admitting: *Deleted

## 2017-08-17 ENCOUNTER — Encounter: Payer: Self-pay | Admitting: Family Medicine

## 2017-08-17 ENCOUNTER — Encounter: Payer: Self-pay | Admitting: Endocrinology

## 2017-08-17 ENCOUNTER — Telehealth: Payer: Self-pay | Admitting: *Deleted

## 2017-08-17 MED ORDER — HYDROCODONE-ACETAMINOPHEN 10-325 MG PO TABS: 1.0000 | ORAL_TABLET | Freq: Four times a day (QID) | ORAL | 0 refills | Status: DC | PRN

## 2017-08-17 NOTE — Telephone Encounter (Signed)
Received PA determination.   PA [id number] approved 08/17/2017 - 02/13/2018.  Pharmacy made aware.   Patient made aware via MyChart.

## 2017-08-17 NOTE — Telephone Encounter (Signed)
Received request from pharmacy for PA on Hydrocodone/APAP.  PA submitted. Confirmation #[id number]  Dx: Chronic Pain. G89.4

## 2017-08-17 NOTE — Telephone Encounter (Signed)
Ok to refill??  Last office visit 06/27/2017.  Last refill 07/17/2017.

## 2017-08-17 NOTE — Telephone Encounter (Signed)
Patient is requesting a refill on Hydrocodone   LOV: 06/27/17  LRF:   07/17/17

## 2017-09-12 ENCOUNTER — Other Ambulatory Visit: Payer: Self-pay | Admitting: Endocrinology

## 2017-09-12 ENCOUNTER — Telehealth: Payer: Self-pay | Admitting: Endocrinology

## 2017-09-12 NOTE — Telephone Encounter (Signed)
PT still has refills on this medication.

## 2017-09-12 NOTE — Telephone Encounter (Signed)
Patient need refill for medication [place], [place] [address]

## 2017-09-17 ENCOUNTER — Other Ambulatory Visit: Payer: Self-pay | Admitting: Family Medicine

## 2017-09-17 ENCOUNTER — Other Ambulatory Visit: Payer: Self-pay | Admitting: Endocrinology

## 2017-09-18 ENCOUNTER — Encounter: Payer: Self-pay | Admitting: Family Medicine

## 2017-09-18 MED ORDER — HYDROCODONE-ACETAMINOPHEN 10-325 MG PO TABS: 1.0000 | ORAL_TABLET | Freq: Four times a day (QID) | ORAL | 0 refills | Status: DC | PRN

## 2017-09-18 NOTE — Telephone Encounter (Signed)
Ok to refill??  Last office visit 06/27/2017.  Last refill  08/17/2017.

## 2017-09-22 ENCOUNTER — Other Ambulatory Visit: Payer: Self-pay | Admitting: Family Medicine

## 2017-09-22 ENCOUNTER — Encounter: Payer: Self-pay | Admitting: Family Medicine

## 2017-09-24 NOTE — Telephone Encounter (Signed)
Ok to refill??  Last office visit 06/27/2017.  Last refill 06/29/2017, #1 refill.

## 2017-09-27 ENCOUNTER — Ambulatory Visit: Payer: Medicaid Other | Admitting: Endocrinology

## 2017-09-27 ENCOUNTER — Other Ambulatory Visit: Payer: Self-pay

## 2017-09-27 ENCOUNTER — Encounter: Payer: Self-pay | Admitting: Endocrinology

## 2017-09-27 VITALS — BP 108/68 | HR 71 | Ht 64.0 in | Wt 162.4 lb

## 2017-09-27 DIAGNOSIS — F339 Major depressive disorder, recurrent, unspecified: Secondary | ICD-10-CM

## 2017-09-27 DIAGNOSIS — E1165 Type 2 diabetes mellitus with hyperglycemia: Secondary | ICD-10-CM

## 2017-09-27 DIAGNOSIS — Z794 Long term (current) use of insulin: Secondary | ICD-10-CM

## 2017-09-27 DIAGNOSIS — E039 Hypothyroidism, unspecified: Secondary | ICD-10-CM

## 2017-09-27 DIAGNOSIS — E1142 Type 2 diabetes mellitus with diabetic polyneuropathy: Secondary | ICD-10-CM | POA: Diagnosis not present

## 2017-09-27 LAB — POCT GLYCOSYLATED HEMOGLOBIN (HGB A1C): Hemoglobin A1C: 8.7 % — AB (ref 4.0–5.6)

## 2017-09-27 MED ORDER — FREESTYLE LIBRE 14 DAY SENSOR MISC: 1.0000 | Freq: Every day | 3 refills | Status: DC

## 2017-09-27 NOTE — Patient Instructions (Addendum)
Less fast food   Take upto 16 Novolog for larger meals  May need more Zoloft

## 2017-09-27 NOTE — Progress Notes (Signed)
Patient ID: Kathryn Oconnell, female   DOB: 08-Nov-1965, 52 y.o.   MRN: [id number]           Reason for Appointment: Follow-up for Type 2 Diabetes and related problems  Referring physician: Dr. Buelah Manis   History of Present Illness:          Date of diagnosis of type 2 diabetes mellitus: 2000        Background history:   She had been on metformin for a few years but this was stopped in 1/16 when her renal function was impaired. She says she had been doing fairly well with this Trulicity 9.67 mg weekly In 10/16 she had significant hyperglycemia while taking oral hypoglycemic drugs and she was started on insulin She was switched back to oral agents with glipizide and Onglyza in 1/17 However subsequently blood sugars started going up and she was back on insulin in April 2017  Recent history:   INSULIN regimen is:  TRESIBA 28 in am, Novolog  8-12 U with  meals  Non-insulin hypoglycemic drugs the patient is taking are: Ozempic 0.5 mg weekly starting 03/21/17  A1c is higher at 8.7%, previously 6.8  Current management, blood sugar patterns and problems identified:  It is unclear why her blood sugars are much higher than before  She thinks that she is eating a lot of sweets and is craving carbohydrates and sugar; she was told to discuss this with her PCP on her last visit but she has not done so  This is despite continuing to take Ozempic 0.5 mg, she tends to have some nausea with this although less recently  She has not done any blood sugars consistently and has only 3 or 4 readings on her meter  Also because of cost and lack of insurance coverage she has only sporadic blood sugars on her freestyle libre sensor which she has used for only less than 24 hours at a time  Blood sugar range recently on her been 163-317 with high readings in the evenings  Not clear what her fasting blood sugars are  Also the last few days she has been on prednisone which has made her blood sugar go up  especially last evening  Also she is not watching her diet and last night had a large hamburger and [place] fries  She does work third shift now and may have lunch at around 2 AM  HYPOGLYCEMIA minimal recently with a couple of days she was low overnight       Side effects from medications have been: None  Compliance with the medical regimen: Fair  HYPOGLYCEMIA: May feel shaky when symptomatic, does not always have symptoms with low sugar readings  Glucose monitoring:        Glucometer:  Libre/Contour Blood Glucose readings as above  PREVIOUS readings:  PRE-MEAL Fasting Lunch Dinner Bedtime Overall  Glucose range:       Mean/median:  103  116  147  127   POST-MEAL PC Breakfast PC Lunch PC Dinner  Glucose range:     Mean/median:    132   Self-care: The diet that the patient has been following is: Reducing portions Meal times are:  Breakfast is at 10 AM, Lunch: 2 Dinner: 7 PM   Typical meal intake: Breakfast is at 10 am, rarely eating fast food biscuits.  Evening meal is meat or chicken with potatoes, greens, macaroni.  Snacks on chips.  She will drink water and some juice, usually diet drinks  Dietician visit, most recent: 6/17               Exercise: walking 3/7 days, about 30 minutes in the evening  Weight history: Previous range 119-196  Wt Readings from Last 3 Encounters:  09/27/17 162 lb 6.4 oz (73.7 kg)  06/27/17 161 lb (73 kg)  05/30/17 155 lb (70.3 kg)    Glycemic control:   Lab Results  Component Value Date   HGBA1C 8.7 (A) 09/27/2017   HGBA1C 6.8 03/22/2017   HGBA1C 7.3 12/18/2016   Lab Results  Component Value Date   MICROALBUR 4.3 (H) 08/25/2016   LDLCALC 126 (H) 05/30/2017   CREATININE 1.29 (H) 05/30/2017   Lab Results  Component Value Date   MICRALBCREAT 3.2 08/25/2016    Other active problems discussed today: See review of systems   Allergies as of 09/27/2017      Reactions   Erythromycin Other (See Comments)   Stomach  cramps   Imitrex [sumatriptan] Other (See Comments)   Migraine and increased BP   Sulfa Antibiotics Other (See Comments)   Causes stomach cramps   Topamax [topiramate] Nausea And Vomiting   Nausea and worsening      Medication List        Accurate as of 09/27/17  2:51 PM. Always use your most recent med list.          amLODipine 10 MG tablet Commonly known as:  NORVASC Take 0.5 tablets (5 mg total) by mouth daily.   aspirin 81 MG tablet Take 81 mg by mouth daily.   atorvastatin 40 MG tablet Commonly known as:  LIPITOR Take 1 tablet (40 mg total) by mouth daily.   Azelastine-Fluticasone 137-50 MCG/ACT Susp Place into the nose.   chlorhexidine 0.12 % solution Commonly known as:  PERIDEX USE AS DIRECTED 15 MLS IN THE MOUTH OR THROAT TWICE DAILY   ciclopirox 8 % solution Commonly known as:  PENLAC   cyclobenzaprine 10 MG tablet Commonly known as:  FLEXERIL Take 1 tablet (10 mg total) by mouth 3 (three) times daily as needed.   fluticasone 50 MCG/ACT nasal spray Commonly known as:  FLONASE Place 2 sprays into the nose daily. For sinuses/allergies   FREESTYLE LIBRE 14 DAY SENSOR Misc 1 each by Does not apply route daily. Apply 1 sensor to body every 14 days to monitor blood sugar.   glucose blood test strip Commonly known as:  BAYER CONTOUR NEXT TEST Use to monitor FSBS 2x daily. Dx: E11.9.   HYDROcodone-acetaminophen 10-325 MG tablet Commonly known as:  NORCO Take 1 tablet by mouth every 6 (six) hours as needed.   insulin aspart 100 UNIT/ML FlexPen Commonly known as:  NOVOLOG FLEXPEN Inject 5 to 60 units twice daily with meals as needed per sliding scale.   LANTUS SOLOSTAR 100 UNIT/ML Solostar Pen Generic drug:  Insulin Glargine INJECT 10-50 UNITS SUBCUTANEOUSLY ONCE DAILY OR AS DIRECTED   levothyroxine 100 MCG tablet Commonly known as:  SYNTHROID, LEVOTHROID TAKE ONE TABLET BY MOUTH ONCE DAILY   LORazepam 1 MG tablet Commonly known as:  ATIVAN TAKE 1  TABLET BY MOUTH THREE TIMES DAILY AS NEEDED FOR ANXIETY   LYRICA 50 MG capsule Generic drug:  pregabalin TAKE 1 CAPSULE BY MOUTH TWICE DAILY   medroxyPROGESTERone 2.5 MG tablet Commonly known as:  PROVERA TAKE 1 TABLET BY MOUTH ONCE DAILY   metoprolol tartrate 25 MG tablet Commonly known as:  LOPRESSOR TAKE 1 TABLET BY MOUTH TWICE DAILY   ONE  TOUCH LANCETS Misc Use to monitor FSBS 1x daily. Dx: E11.9.   OZEMPIC 0.25 or 0.5 MG/DOSE Sopn Generic drug:  Semaglutide INJECT 0.5 MG INTO THE SKIN ONCE A WEEK   promethazine 25 MG tablet Commonly known as:  PHENERGAN TAKE 1 TABLET BY MOUTH EVERY 8 HOURS AS NEEDED FOR NAUSEA OR VOMITING   RELION PEN NEEDLES 31G X 6 MM Misc Generic drug:  Insulin Pen Needle USE AS DIRECTED   sertraline 50 MG tablet Commonly known as:  ZOLOFT Take 1 tablet (50 mg total) by mouth daily.   TRESIBA FLEXTOUCH 100 UNIT/ML Sopn FlexTouch Pen Generic drug:  insulin degludec INJECT 24 UNITS INTO THE SKIN DAILY   valACYclovir 1000 MG tablet Commonly known as:  VALTREX Take 1 tablet (1,000 mg total) by mouth daily.   zolpidem 12.5 MG CR tablet Commonly known as:  AMBIEN CR TAKE 1 TABLET BY MOUTH AT BEDTIME AS NEEDED       Allergies:  Allergies  Allergen Reactions  . Erythromycin Other (See Comments)    Stomach cramps  . Imitrex [Sumatriptan] Other (See Comments)    Migraine and increased BP  . Sulfa Antibiotics Other (See Comments)    Causes stomach cramps  . Topamax [Topiramate] Nausea And Vomiting    Nausea and worsening    Past Medical History:  Diagnosis Date  . Anxiety   . Chronic kidney disease   . COPD (chronic obstructive pulmonary disease) ([place])   . Depression   . Diabetes mellitus without complication ([place])   . Eczema   . Graves disease   . HSV-2 infection   . Hyperlipidemia   . Hypertension   . Hypothyroid   . Lung nodule   . Menopausal vaginal dryness 02/24/2014  . Migraines   . Neuropathy   . Tobacco use   .  Trouble in sleeping 06/25/2015    Past Surgical History:  Procedure Laterality Date  . CESAREAN SECTION     x 2  . FOOT SURGERY     5th digit left foot / Great toe, 2ND TOE  . THYROID SURGERY      Family History  Problem Relation Age of Onset  . Stroke Mother        x 3  . Cancer Mother        breast   . Thyroid disease Mother   . Hyperlipidemia Mother   . Cancer Father        stomach cancer   . Hypertension Father   . Heart attack Father 9  . Gout Brother   . Migraines Son   . Hypertension Paternal Uncle   . Heart disease Maternal Grandmother   . Heart attack Maternal Grandmother   . Heart disease Maternal Grandfather   . Heart attack Maternal Grandfather   . Cancer Paternal Grandmother   . Diabetes Paternal Grandfather   . Asthma Daughter     Social History:  reports that she has been smoking cigarettes.  She has a 17.50 pack-year smoking history. She has never used smokeless tobacco. She reports that she does not drink alcohol or use drugs.   Review of Systems        NEUROPATHY: She has been on Lyrica for symptomatic relief and is doing much better This was authorized for her through her insurance  She says she did not benefit from gabapentin and Cymbalta previously  Lipid history:  on Lipitor 20 mg prescribed by PCP    Lab Results  Component Value Date  CHOL 189 05/30/2017   HDL 47 (L) 05/30/2017   LDLCALC 126 (H) 05/30/2017   LDLDIRECT 95 10/04/2015   TRIG 69 05/30/2017   CHOLHDL 4.0 05/30/2017           Hypertension: mild, on metoprolol 12.5 mg twice a day along with 10 mg amlodipine She has been followed by PCP    BP Readings from Last 3 Encounters:  09/27/17 108/68  06/27/17 134/68  05/30/17 130/76    Most recent eye exam was In 05/2016 Last foot exam 9/18   HYPOTHYROIDISM:  She is on long-term supplementation with levothyroxine after surgery The dose was increased from 88 up to 100 g previously     TSH levels as below:  Lab  Results  Component Value Date   TSH 0.63 06/27/2017   TSH 0.67 01/22/2017   TSH 0.68 08/25/2016   FREET4 1.1 06/27/2017   FREET4 1.10 08/25/2016   FREET4 0.88 11/17/2015   Depression: She is only on 50 mg of Zoloft and this is not helping her  Occasionally has difficulty swallowing  Physical Examination:  BP 108/68 (BP Location: Left Arm, Patient Position: Sitting, Cuff Size: Normal)   Pulse 71   Ht 5\' 4"  (1.626 m)   Wt 162 lb 6.4 oz (73.7 kg)   LMP 06/18/2013 Comment: irregular  SpO2 90%   BMI 27.88 kg/m   Thyroid not palpable  ASSESSMENT:  Diabetes type 2, insulin requiring  See history of present illness for detailed discussion of current diabetes management, blood sugar patterns and problems identified  She is on a regimen of basal bolus insulin and Ozempic, 0.5 mg weekly  As discussed above difficult to assess her blood sugars because of lack of glucose monitoring at home However when she does have the sensor available she does check her blood sugars 4 times a day or more She is taking mealtime insulin up to 3 times a day Difficult to assess her blood sugar control without adequate monitoring on her freestyle Libre sensor currently  She does have some tendency to low blood sugars occasionally and may need to reduce her basal insulin  NEUROPATHY: She is getting relief with Lyrica currently   HYPOTHYROID: Will follow-up with her TSH level on the next visit  LIPIDS: Adequately controlled and probably worse on the last visit because of poor diet  Occasional dysphagia: This may be related to excessive postnasal drainage, she will discuss with PCP   PLAN:    Prior authorization for freestyle Elenor Legato will be done, she does qualify with the Medicare requirements for this Reduce basal insulin by 2 units Probably needs 14 to 16 units of NovoLog for her main meals She needs to stop eating a lot of fast food and cut back on sweets and fried food She will discuss  adjustment of her Zoloft with her PCP   Counseling time on subjects related to diabetes and neuropathy discussed in assessment and plan sections is over 50% of today's 25 minute visit   Patient Instructions  Less fast food   Take upto 16 Novolog for larger meals  May need more Zoloft   Counseling time on subjects discussed in assessment and plan sections is over 50% of today's 25 minute visit      Elayne Snare 09/27/2017, 2:51 PM   Note: This office note was prepared with Dragon voice recognition system technology. Any transcriptional errors that result from this process are unintentional.

## 2017-10-02 ENCOUNTER — Telehealth: Payer: Self-pay

## 2017-10-02 NOTE — Telephone Encounter (Signed)
Called pt to notify her that Dr. Dwyane Dee is refusing to fill out the forms for her to receive a handicap placard citing, her PCP needs to do this. Pt did not answer the phone and a message was left to have her call office back.

## 2017-10-10 ENCOUNTER — Other Ambulatory Visit: Payer: Self-pay | Admitting: Family Medicine

## 2017-10-16 ENCOUNTER — Other Ambulatory Visit: Payer: Self-pay | Admitting: Family Medicine

## 2017-10-16 ENCOUNTER — Encounter: Payer: Self-pay | Admitting: Family Medicine

## 2017-10-16 MED ORDER — HYDROCODONE-ACETAMINOPHEN 10-325 MG PO TABS: 1.0000 | ORAL_TABLET | Freq: Four times a day (QID) | ORAL | 0 refills | Status: DC | PRN

## 2017-10-16 NOTE — Telephone Encounter (Signed)
Ok to refill??  Last office visit 3/ 27/20129.    Last refill 09/18/2017.

## 2017-10-25 ENCOUNTER — Other Ambulatory Visit: Payer: Self-pay | Admitting: Endocrinology

## 2017-10-29 ENCOUNTER — Ambulatory Visit: Payer: BLUE CROSS/BLUE SHIELD | Admitting: Family Medicine

## 2017-10-31 ENCOUNTER — Telehealth: Payer: Self-pay | Admitting: Endocrinology

## 2017-10-31 ENCOUNTER — Other Ambulatory Visit: Payer: Self-pay | Admitting: Family Medicine

## 2017-10-31 NOTE — Telephone Encounter (Signed)
Please refill if appropriate

## 2017-10-31 NOTE — Telephone Encounter (Signed)
LYRICA 50 MG capsule  Patient stated that she is needing a refill but would like to see if Dr Dwyane Dee could up her dosage  She stated that she only has 2 pills left.    Please advise   [facility] [address], [place] [address] [address]

## 2017-11-01 ENCOUNTER — Telehealth: Payer: Self-pay | Admitting: Endocrinology

## 2017-11-01 ENCOUNTER — Encounter: Payer: Self-pay | Admitting: Endocrinology

## 2017-11-01 NOTE — Telephone Encounter (Signed)
Please print prescription, not clear what dosage she is concerned about

## 2017-11-01 NOTE — Telephone Encounter (Signed)
Patient stated that the pharmacy denied her medication and she would like to know why this was.  Please advise

## 2017-11-02 ENCOUNTER — Other Ambulatory Visit: Payer: Self-pay | Admitting: Endocrinology

## 2017-11-02 ENCOUNTER — Other Ambulatory Visit: Payer: Self-pay

## 2017-11-02 MED ORDER — PREGABALIN 50 MG PO CAPS: 50.0000 mg | ORAL_CAPSULE | Freq: Two times a day (BID) | ORAL | 1 refills | Status: DC

## 2017-11-02 NOTE — Telephone Encounter (Signed)
Rx printed out for MD signature.

## 2017-11-02 NOTE — Telephone Encounter (Signed)
For some reason, the MD is not being allowed to e-scribe controlled substances, so all controlled medications are being printed out and the MD is signing the Rx and then they are being faxed to the pharmacy on file.

## 2017-11-04 ENCOUNTER — Encounter: Payer: Self-pay | Admitting: Endocrinology

## 2017-11-06 ENCOUNTER — Telehealth: Payer: Self-pay | Admitting: Endocrinology

## 2017-11-06 NOTE — Telephone Encounter (Signed)
Patient is calling to follow up on the prescription that was being faxed over to her pharmacy   Please advise

## 2017-11-06 NOTE — Telephone Encounter (Signed)
Signed and ready, not clear if it was faxed before

## 2017-11-06 NOTE — Telephone Encounter (Signed)
Please advise on status of Lyrica RX.

## 2017-11-07 ENCOUNTER — Other Ambulatory Visit: Payer: Self-pay

## 2017-11-07 ENCOUNTER — Ambulatory Visit (INDEPENDENT_AMBULATORY_CARE_PROVIDER_SITE_OTHER): Payer: Medicaid Other | Admitting: Family Medicine

## 2017-11-07 ENCOUNTER — Encounter: Payer: Self-pay | Admitting: Family Medicine

## 2017-11-07 VITALS — BP 110/62 | HR 70 | Temp 98.9°F | Resp 14 | Ht 64.0 in | Wt 162.0 lb

## 2017-11-07 DIAGNOSIS — Z1239 Encounter for other screening for malignant neoplasm of breast: Secondary | ICD-10-CM

## 2017-11-07 DIAGNOSIS — J329 Chronic sinusitis, unspecified: Secondary | ICD-10-CM

## 2017-11-07 DIAGNOSIS — N183 Chronic kidney disease, stage 3 unspecified: Secondary | ICD-10-CM

## 2017-11-07 DIAGNOSIS — I10 Essential (primary) hypertension: Secondary | ICD-10-CM | POA: Diagnosis not present

## 2017-11-07 DIAGNOSIS — E782 Mixed hyperlipidemia: Secondary | ICD-10-CM

## 2017-11-07 DIAGNOSIS — G6289 Other specified polyneuropathies: Secondary | ICD-10-CM

## 2017-11-07 DIAGNOSIS — G894 Chronic pain syndrome: Secondary | ICD-10-CM | POA: Diagnosis not present

## 2017-11-07 DIAGNOSIS — Z720 Tobacco use: Secondary | ICD-10-CM

## 2017-11-07 DIAGNOSIS — J410 Simple chronic bronchitis: Secondary | ICD-10-CM | POA: Diagnosis not present

## 2017-11-07 DIAGNOSIS — Z1211 Encounter for screening for malignant neoplasm of colon: Secondary | ICD-10-CM

## 2017-11-07 DIAGNOSIS — Z1231 Encounter for screening mammogram for malignant neoplasm of breast: Secondary | ICD-10-CM

## 2017-11-07 DIAGNOSIS — F331 Major depressive disorder, recurrent, moderate: Secondary | ICD-10-CM

## 2017-11-07 MED ORDER — AMOXICILLIN 875 MG PO TABS: 875.0000 mg | ORAL_TABLET | Freq: Two times a day (BID) | ORAL | 0 refills | Status: DC
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
## 2017-11-07 NOTE — Telephone Encounter (Signed)
Faxed today

## 2017-11-07 NOTE — Patient Instructions (Addendum)
Referral to ENT  Schedule your mammogram  COloguard  F/U 4 months

## 2017-11-08 LAB — CBC WITH DIFFERENTIAL/PLATELET
Basophils Absolute: 53 cells/uL (ref 0–200)
Basophils Relative: 0.6 %
Eosinophils Absolute: 151 cells/uL (ref 15–500)
Eosinophils Relative: 1.7 %
HEMATOCRIT: 41.3 % (ref 35.0–45.0)
Hemoglobin: 13.9 g/dL (ref 11.7–15.5)
Lymphs Abs: 2225 cells/uL (ref 850–3900)
MCH: 32.3 pg (ref 27.0–33.0)
MCHC: 33.7 g/dL (ref 32.0–36.0)
MCV: 95.8 fL (ref 80.0–100.0)
MPV: 10.5 fL (ref 7.5–12.5)
Monocytes Relative: 7.4 %
NEUTROS PCT: 65.3 %
Neutro Abs: 5812 cells/uL (ref 1500–7800)
Platelets: 231 10*3/uL (ref 140–400)
RBC: 4.31 10*6/uL (ref 3.80–5.10)
RDW: 14.1 % (ref 11.0–15.0)
Total Lymphocyte: 25 %
WBC: 8.9 10*3/uL (ref 3.8–10.8)
WBCMIX: 659 {cells}/uL (ref 200–950)

## 2017-11-08 LAB — COMPREHENSIVE METABOLIC PANEL
AG Ratio: 1.3 (calc) (ref 1.0–2.5)
ALT: 17 U/L (ref 6–29)
AST: 27 U/L (ref 10–35)
Albumin: 3.8 g/dL (ref 3.6–5.1)
Alkaline phosphatase (APISO): 87 U/L (ref 33–130)
BILIRUBIN TOTAL: 0.6 mg/dL (ref 0.2–1.2)
BUN/Creatinine Ratio: 12 (calc) (ref 6–22)
BUN: 16 mg/dL (ref 7–25)
CALCIUM: 9 mg/dL (ref 8.6–10.4)
CO2: 21 mmol/L (ref 20–32)
Chloride: 107 mmol/L (ref 98–110)
Creat: 1.33 mg/dL — ABNORMAL HIGH (ref 0.50–1.05)
Globulin: 2.9 g/dL (calc) (ref 1.9–3.7)
Glucose, Bld: 152 mg/dL — ABNORMAL HIGH (ref 65–99)
Potassium: 5.2 mmol/L (ref 3.5–5.3)
Sodium: 137 mmol/L (ref 135–146)
Total Protein: 6.7 g/dL (ref 6.1–8.1)

## 2017-11-08 LAB — LIPID PANEL
Cholesterol: 156 mg/dL (ref <200)
HDL: 43 mg/dL — ABNORMAL LOW (ref >50)
LDL Cholesterol (Calc): 98 mg/dL (calc)
Non-HDL Cholesterol (Calc): 113 mg/dL (calc) (ref <130)
TRIGLYCERIDES: 68 mg/dL (ref <150)
Total CHOL/HDL Ratio: 3.6 (calc) (ref <5.0)

## 2017-11-11 ENCOUNTER — Encounter: Payer: Self-pay | Admitting: Family Medicine

## 2017-11-11 MED ORDER — SERTRALINE HCL 100 MG PO TABS: 100.0000 mg | ORAL_TABLET | Freq: Every day | ORAL | 6 refills | Status: DC

## 2017-11-11 NOTE — Assessment & Plan Note (Signed)
Increase zoloft to 100mg  at bedtime

## 2017-11-11 NOTE — Assessment & Plan Note (Signed)
On statin drug due for lipids

## 2017-11-11 NOTE — Assessment & Plan Note (Signed)
On chronic naroctics

## 2017-11-11 NOTE — Assessment & Plan Note (Addendum)
Controlled no changes, not taking norvasc regulary due to some low BP at times

## 2017-11-11 NOTE — Assessment & Plan Note (Signed)
counsled on cessation, continues to smoke despite COPD No recent exacerbations

## 2017-11-14 ENCOUNTER — Encounter: Payer: Self-pay | Admitting: *Deleted

## 2017-11-18 ENCOUNTER — Other Ambulatory Visit: Payer: Self-pay | Admitting: Family Medicine

## 2017-11-18 ENCOUNTER — Encounter: Payer: Self-pay | Admitting: Family Medicine

## 2017-11-19 MED ORDER — HYDROCODONE-ACETAMINOPHEN 10-325 MG PO TABS: 1.0000 | ORAL_TABLET | Freq: Four times a day (QID) | ORAL | 0 refills | Status: DC | PRN

## 2017-11-19 NOTE — Telephone Encounter (Signed)
Ok to refill??  Last office visit 11/07/2017.  Last refill 10/16/2017.

## 2017-11-22 ENCOUNTER — Ambulatory Visit: Payer: Self-pay | Admitting: Endocrinology

## 2017-11-22 ENCOUNTER — Ambulatory Visit (HOSPITAL_COMMUNITY): Payer: Self-pay

## 2017-11-22 DIAGNOSIS — Z0289 Encounter for other administrative examinations: Secondary | ICD-10-CM

## 2017-12-12 ENCOUNTER — Encounter: Payer: Self-pay | Admitting: Family Medicine

## 2017-12-12 ENCOUNTER — Other Ambulatory Visit: Payer: Self-pay | Admitting: Family Medicine

## 2017-12-12 MED ORDER — LYRICA 50 MG PO CAPS: 50.0000 mg | ORAL_CAPSULE | Freq: Two times a day (BID) | ORAL | 1 refills | Status: DC

## 2017-12-12 NOTE — Telephone Encounter (Signed)
Ok to refill??  Last office visit 11/07/2017.  Last refill on Lorazepam 09/25/2017, #1 refill.   Last refill on Zolpidem 05/01/2017, #2 refills.

## 2017-12-12 NOTE — Telephone Encounter (Signed)
Please sign to send to pharmacy.

## 2017-12-14 ENCOUNTER — Telehealth: Payer: Self-pay

## 2017-12-14 NOTE — Telephone Encounter (Signed)
Prior authorization started for Zolpidem Tartrate CR 12.5mg    Prior authorization approved pharmacy aware [id number]

## 2017-12-19 ENCOUNTER — Encounter: Payer: Self-pay | Admitting: Family Medicine

## 2017-12-19 ENCOUNTER — Other Ambulatory Visit: Payer: Self-pay | Admitting: Family Medicine

## 2017-12-19 MED ORDER — HYDROCODONE-ACETAMINOPHEN 10-325 MG PO TABS: 1.0000 | ORAL_TABLET | Freq: Four times a day (QID) | ORAL | 0 refills | Status: DC | PRN

## 2017-12-19 NOTE — Telephone Encounter (Signed)
Ok to refill??  Last office visit 11/07/2017.  Last refill 11/19/2017.

## 2017-12-20 ENCOUNTER — Ambulatory Visit (HOSPITAL_COMMUNITY): Payer: Self-pay

## 2017-12-27 ENCOUNTER — Encounter: Payer: Self-pay | Admitting: *Deleted

## 2017-12-27 ENCOUNTER — Telehealth: Payer: Self-pay | Admitting: *Deleted

## 2017-12-27 NOTE — Telephone Encounter (Signed)
Received request from pharmacy for PA on Ambien.   PA submitted.   Dx: Insomnia (G47).  Confirmation # [id number] W.

## 2017-12-30 ENCOUNTER — Encounter: Payer: Self-pay | Admitting: Family Medicine

## 2018-01-01 NOTE — Telephone Encounter (Signed)
Received PA determination.   PA approved 12/27/2017 - 06/25/2018.  Pharmacy made aware.

## 2018-01-02 ENCOUNTER — Other Ambulatory Visit: Payer: Self-pay | Admitting: Family Medicine

## 2018-01-02 ENCOUNTER — Encounter: Payer: Self-pay | Admitting: Family Medicine

## 2018-01-02 DIAGNOSIS — Z1231 Encounter for screening mammogram for malignant neoplasm of breast: Secondary | ICD-10-CM

## 2018-01-02 NOTE — Telephone Encounter (Signed)
Ok to refill 

## 2018-01-04 ENCOUNTER — Ambulatory Visit: Payer: Self-pay | Admitting: Endocrinology

## 2018-01-04 ENCOUNTER — Ambulatory Visit (HOSPITAL_COMMUNITY): Payer: Self-pay

## 2018-01-04 DIAGNOSIS — Z0289 Encounter for other administrative examinations: Secondary | ICD-10-CM

## 2018-01-17 ENCOUNTER — Encounter: Payer: Self-pay | Admitting: Family Medicine

## 2018-01-17 MED ORDER — HYDROCODONE-ACETAMINOPHEN 10-325 MG PO TABS: 1.0000 | ORAL_TABLET | Freq: Four times a day (QID) | ORAL | 0 refills | Status: DC | PRN

## 2018-01-17 NOTE — Telephone Encounter (Signed)
Ok to refill??  Last office visit 11/07/2017.  Last refill 12/19/2017.

## 2018-01-26 ENCOUNTER — Other Ambulatory Visit: Payer: Self-pay | Admitting: Family Medicine

## 2018-01-28 ENCOUNTER — Encounter: Payer: Self-pay | Admitting: Family Medicine

## 2018-02-01 ENCOUNTER — Ambulatory Visit (HOSPITAL_COMMUNITY): Payer: Self-pay

## 2018-02-11 ENCOUNTER — Ambulatory Visit: Payer: Self-pay | Admitting: Endocrinology

## 2018-02-11 DIAGNOSIS — Z0289 Encounter for other administrative examinations: Secondary | ICD-10-CM

## 2018-02-15 ENCOUNTER — Encounter: Payer: Self-pay | Admitting: Family Medicine

## 2018-02-15 ENCOUNTER — Other Ambulatory Visit: Payer: Self-pay | Admitting: Family Medicine

## 2018-02-15 MED ORDER — HYDROCODONE-ACETAMINOPHEN 10-325 MG PO TABS: 1.0000 | ORAL_TABLET | Freq: Four times a day (QID) | ORAL | 0 refills | Status: DC | PRN

## 2018-02-15 NOTE — Telephone Encounter (Signed)
Ok to refill??  Last office visit 11/07/2017.  Last refill 01/17/2018.

## 2018-02-16 ENCOUNTER — Encounter: Payer: Self-pay | Admitting: *Deleted

## 2018-02-16 ENCOUNTER — Telehealth: Payer: Self-pay | Admitting: *Deleted

## 2018-02-16 NOTE — Telephone Encounter (Signed)
Received request from pharmacy for PA on Hydrocodone/APAP.  PA submitted. Confirmation code: [id number] W.  Dx: Chronic Pain- G89.4

## 2018-02-18 NOTE — Telephone Encounter (Signed)
Received PA determination.   PA [id number] approved 02/16/2018 - 08/15/2018.  Pharmacy made aware.   Call placed to patient and patient made aware.

## 2018-02-22 ENCOUNTER — Telehealth: Payer: Self-pay

## 2018-02-22 NOTE — Telephone Encounter (Signed)
PA for tresiba flextouch approved from 02/22/18 until 02/17/19 PA# [id number]

## 2018-03-11 ENCOUNTER — Ambulatory Visit: Payer: Medicaid Other | Admitting: Family Medicine

## 2018-03-11 ENCOUNTER — Encounter: Payer: Self-pay | Admitting: Family Medicine

## 2018-03-11 MED ORDER — LORAZEPAM 1 MG PO TABS: 1.0000 mg | ORAL_TABLET | Freq: Three times a day (TID) | ORAL | 1 refills | Status: DC | PRN

## 2018-03-11 NOTE — Telephone Encounter (Signed)
Ok to refill??  Last office visit 11/07/2017.  Last refill 12/12/2017, #1 refill.

## 2018-03-12 ENCOUNTER — Other Ambulatory Visit: Payer: Self-pay | Admitting: Family Medicine

## 2018-03-13 ENCOUNTER — Encounter: Payer: Self-pay | Admitting: Family Medicine

## 2018-03-21 ENCOUNTER — Encounter: Payer: Self-pay | Admitting: Family Medicine

## 2018-03-21 ENCOUNTER — Other Ambulatory Visit: Payer: Self-pay | Admitting: Family Medicine

## 2018-03-21 NOTE — Telephone Encounter (Signed)
Ok to refill??  Last office visit 11/07/2017.  Last refill 02/15/2018.

## 2018-03-22 MED ORDER — HYDROCODONE-ACETAMINOPHEN 10-325 MG PO TABS: 1.0000 | ORAL_TABLET | Freq: Four times a day (QID) | ORAL | 0 refills | Status: DC | PRN

## 2018-03-25 ENCOUNTER — Encounter: Payer: Self-pay | Admitting: Family Medicine

## 2018-03-25 ENCOUNTER — Ambulatory Visit: Payer: Medicaid Other | Admitting: Family Medicine

## 2018-03-25 ENCOUNTER — Other Ambulatory Visit: Payer: Self-pay

## 2018-03-25 VITALS — BP 124/68 | HR 92 | Temp 98.1°F | Resp 14 | Ht 64.0 in | Wt 168.0 lb

## 2018-03-25 DIAGNOSIS — G6289 Other specified polyneuropathies: Secondary | ICD-10-CM

## 2018-03-25 DIAGNOSIS — I10 Essential (primary) hypertension: Secondary | ICD-10-CM | POA: Diagnosis not present

## 2018-03-25 DIAGNOSIS — N183 Chronic kidney disease, stage 3 unspecified: Secondary | ICD-10-CM

## 2018-03-25 DIAGNOSIS — Z794 Long term (current) use of insulin: Secondary | ICD-10-CM

## 2018-03-25 DIAGNOSIS — E039 Hypothyroidism, unspecified: Secondary | ICD-10-CM

## 2018-03-25 DIAGNOSIS — G894 Chronic pain syndrome: Secondary | ICD-10-CM

## 2018-03-25 DIAGNOSIS — E782 Mixed hyperlipidemia: Secondary | ICD-10-CM

## 2018-03-25 DIAGNOSIS — G5622 Lesion of ulnar nerve, left upper limb: Secondary | ICD-10-CM

## 2018-03-25 DIAGNOSIS — J410 Simple chronic bronchitis: Secondary | ICD-10-CM

## 2018-03-25 DIAGNOSIS — Z1239 Encounter for other screening for malignant neoplasm of breast: Secondary | ICD-10-CM

## 2018-03-25 DIAGNOSIS — E1122 Type 2 diabetes mellitus with diabetic chronic kidney disease: Secondary | ICD-10-CM | POA: Diagnosis not present

## 2018-03-25 MED ORDER — OXYCODONE-ACETAMINOPHEN 5-325 MG PO TABS: 1.0000 | ORAL_TABLET | Freq: Three times a day (TID) | ORAL | 0 refills | Status: DC | PRN

## 2018-03-25 NOTE — Assessment & Plan Note (Signed)
Currently asymptomatic.  She has had her flu shot.  She does have inhalers on hand.

## 2018-03-25 NOTE — Patient Instructions (Addendum)
Nerve conduction to be done  We will with lab results  Oxycodone- acetaminophen 5-325mg   Schedule mammogram appointment  F/U 4 months

## 2018-03-25 NOTE — Assessment & Plan Note (Signed)
Blood pressure is controlled.  No change to medication.

## 2018-03-25 NOTE — Assessment & Plan Note (Signed)
She has known peripheral neuropathy in her feet and her hands.  She is worsening now in her finger she may have an ulnar neuropathy on top of the neuropathy secondary to her diabetes.  We will obtain a nerve conduction study.

## 2018-03-25 NOTE — Assessment & Plan Note (Signed)
We will change her to oxycodone acetaminophen 5/325 mg but will release her to a number of 90 tablets/month she can start this on her next fill.

## 2018-03-25 NOTE — Assessment & Plan Note (Signed)
Diabetes with chronic kidney disease.  Discussed importance of following up with her endocrinologist.  She has not had an A1c since June we will obtain this today as well as a thyroid function study.  Goal is to keep her A1c less than 7% to help preserve the renal function she does have left.

## 2018-03-25 NOTE — Progress Notes (Signed)
Subjective:    Patient ID: Kathryn Oconnell, female    DOB: 05/29/1965, 52 y.o.   MRN: [id number]  Patient presents for Follow-up (is not fasting)   Pt here to f/u chronic medical problems   Medications    Her brother passed away 61 years of age from Heart attack in Oct     DM- followed by Dr. Dwyane Dee, last 8.7% , highest 274, no hypoglycemia      Tresiba 30 units Ozempic- 0.5 once a week  Novolog- sliding scale, typically getting in 1 dose a day , she will take insulin at work if she eat , often skips her lunch   Missed her last 3 appt with her endocrinologist    Lipids normal in August, LDL  98   HTN- taking BP meds   She is gets on knots on her fingers over the past months     Has known neuropathy but Numbness worse in her left hand 5th digit   Told  By orthopedics she has a pinched nerve in her elbow- has not nerve conduction performed as she has not returned there  Taking lyrica 50mg  twice a day    GAD- taking ativan   took ativan with pregabalin- and felt drunk, advised not to take together  Chronic pain- norco 10mg  no longer helping requested to go up to oxycodone      Review Of Systems:  GEN- denies fatigue, fever, weight loss,weakness, recent illness HEENT- denies eye drainage, change in vision, nasal discharge, CVS- denies chest pain, palpitations RESP- denies SOB, cough, wheeze ABD- denies N/V, change in stools, abd pain GU- denies dysuria, hematuria, dribbling, incontinence MSK- +joint pain, muscle aches, injury Neuro- denies headache, dizziness, syncope, seizure activity       Objective:    BP 124/68   Pulse 92   Temp 98.1 F (36.7 C) (Oral)   Resp 14   Ht 5\' 4"  (1.626 m)   Wt 168 lb (76.2 kg)   LMP 06/18/2013 Comment: irregular  SpO2 96%   BMI 28.84 kg/m  GEN- NAD, alert and oriented x3, falling asleep worked 3rd shift  HEENT- PERRL, EOMI, non injected sclera, pink conjunctiva, MMM, oropharynx clear Neck- Supple, no thyromegaly CVS- RRR, no  murmur RESP-CTAB ABD-NABS,soft,NT,ND EXT- No edema Pulses- Radial, DP- 2+        Assessment & Plan:      Problem List Items Addressed This Visit      Unprioritized   Chronic pain syndrome    We will change her to oxycodone acetaminophen 5/325 mg but will release her to a number of 90 tablets/month she can start this on her next fill.      COPD (chronic obstructive pulmonary disease) (HCC)    Currently asymptomatic.  She has had her flu shot.  She does have inhalers on hand.      Diabetes mellitus ([place])    Diabetes with chronic kidney disease.  Discussed importance of following up with her endocrinologist.  She has not had an A1c since June we will obtain this today as well as a thyroid function study.  Goal is to keep her A1c less than 7% to help preserve the renal function she does have left.      Relevant Orders   Hemoglobin A1c   Microalbumin / creatinine urine ratio   Essential hypertension, benign - Primary    Blood pressure is controlled.  No change to medication.      Relevant Orders  CBC with Differential/Platelet   Comprehensive metabolic panel   Hyperlipidemia   Hypothyroidism   Relevant Orders   TSH   T4, free   Peripheral neuropathy    She has known peripheral neuropathy in her feet and her hands.  She is worsening now in her finger she may have an ulnar neuropathy on top of the neuropathy secondary to her diabetes.  We will obtain a nerve conduction study.         Note: This dictation was prepared with Dragon dictation along with smaller phrase technology. Any transcriptional errors that result from this process are unintentional.

## 2018-03-26 LAB — COMPREHENSIVE METABOLIC PANEL
AG Ratio: 1.4 (calc) (ref 1.0–2.5)
ALT: 18 U/L (ref 6–29)
AST: 25 U/L (ref 10–35)
Albumin: 3.8 g/dL (ref 3.6–5.1)
Alkaline phosphatase (APISO): 94 U/L (ref 33–130)
BUN/Creatinine Ratio: 13 (calc) (ref 6–22)
BUN: 17 mg/dL (ref 7–25)
CO2: 22 mmol/L (ref 20–32)
Calcium: 9 mg/dL (ref 8.6–10.4)
Chloride: 106 mmol/L (ref 98–110)
Creat: 1.35 mg/dL — ABNORMAL HIGH (ref 0.50–1.05)
Globulin: 2.7 g/dL (calc) (ref 1.9–3.7)
Glucose, Bld: 269 mg/dL — ABNORMAL HIGH (ref 65–99)
Potassium: 5.4 mmol/L — ABNORMAL HIGH (ref 3.5–5.3)
Sodium: 134 mmol/L — ABNORMAL LOW (ref 135–146)
TOTAL PROTEIN: 6.5 g/dL (ref 6.1–8.1)
Total Bilirubin: 0.5 mg/dL (ref 0.2–1.2)

## 2018-03-26 LAB — CBC WITH DIFFERENTIAL/PLATELET
ABSOLUTE MONOCYTES: 774 {cells}/uL (ref 200–950)
Basophils Absolute: 52 cells/uL (ref 0–200)
Basophils Relative: 0.6 %
EOS ABS: 181 {cells}/uL (ref 15–500)
Eosinophils Relative: 2.1 %
HCT: 39.2 % (ref 35.0–45.0)
HEMOGLOBIN: 12.9 g/dL (ref 11.7–15.5)
Lymphs Abs: 3474 cells/uL (ref 850–3900)
MCH: 32.7 pg (ref 27.0–33.0)
MCHC: 32.9 g/dL (ref 32.0–36.0)
MCV: 99.2 fL (ref 80.0–100.0)
MPV: 10.9 fL (ref 7.5–12.5)
Monocytes Relative: 9 %
NEUTROS ABS: 4119 {cells}/uL (ref 1500–7800)
Neutrophils Relative %: 47.9 %
Platelets: 239 10*3/uL (ref 140–400)
RBC: 3.95 10*6/uL (ref 3.80–5.10)
RDW: 13.6 % (ref 11.0–15.0)
Total Lymphocyte: 40.4 %
WBC: 8.6 10*3/uL (ref 3.8–10.8)

## 2018-03-26 LAB — HEMOGLOBIN A1C
Hgb A1c MFr Bld: 7.7 % of total Hgb — ABNORMAL HIGH (ref <5.7)
Mean Plasma Glucose: 174 (calc)
eAG (mmol/L): 9.7 (calc)

## 2018-03-26 LAB — T4, FREE: Free T4: 1.1 ng/dL (ref 0.8–1.8)

## 2018-03-26 LAB — TSH: TSH: 0.71 mIU/L

## 2018-03-26 LAB — MICROALBUMIN / CREATININE URINE RATIO
Creatinine, Urine: 39 mg/dL (ref 20–275)
Microalb Creat Ratio: 10 mcg/mg creat (ref <30)
Microalb, Ur: 0.4 mg/dL

## 2018-03-29 ENCOUNTER — Encounter: Payer: Self-pay | Admitting: Emergency Medicine

## 2018-03-29 DIAGNOSIS — F422 Mixed obsessional thoughts and acts: Secondary | ICD-10-CM | POA: Insufficient documentation

## 2018-03-29 DIAGNOSIS — F411 Generalized anxiety disorder: Secondary | ICD-10-CM

## 2018-04-04 ENCOUNTER — Encounter: Payer: Self-pay | Admitting: *Deleted

## 2018-04-11 ENCOUNTER — Encounter: Payer: Self-pay | Admitting: Family Medicine

## 2018-04-11 ENCOUNTER — Other Ambulatory Visit: Payer: Self-pay | Admitting: Family Medicine

## 2018-04-11 DIAGNOSIS — J00 Acute nasopharyngitis [common cold]: Secondary | ICD-10-CM | POA: Diagnosis not present

## 2018-04-11 DIAGNOSIS — Z6828 Body mass index (BMI) 28.0-28.9, adult: Secondary | ICD-10-CM | POA: Diagnosis not present

## 2018-04-11 NOTE — Telephone Encounter (Signed)
Ok to refill??  Last office visit 03/25/2018.  Last refill on Lyrica and Ambien 12/12/2017, #2 refills.   Last refill on Ativan 03/21/2018.

## 2018-04-11 NOTE — Telephone Encounter (Signed)
Ok to refill 

## 2018-04-12 MED ORDER — LORAZEPAM 1 MG PO TABS: 1.0000 mg | ORAL_TABLET | Freq: Three times a day (TID) | ORAL | 1 refills | Status: DC | PRN

## 2018-04-12 MED ORDER — ZOLPIDEM TARTRATE ER 12.5 MG PO TBCR: 12.5000 mg | EXTENDED_RELEASE_TABLET | Freq: Every evening | ORAL | 2 refills | Status: DC | PRN

## 2018-04-12 MED ORDER — LYRICA 50 MG PO CAPS: 50.0000 mg | ORAL_CAPSULE | Freq: Two times a day (BID) | ORAL | 1 refills | Status: DC

## 2018-04-19 ENCOUNTER — Encounter: Payer: Self-pay | Admitting: Family Medicine

## 2018-04-19 MED ORDER — OXYCODONE-ACETAMINOPHEN 5-325 MG PO TABS: 1.0000 | ORAL_TABLET | Freq: Three times a day (TID) | ORAL | 0 refills | Status: DC | PRN

## 2018-04-19 NOTE — Telephone Encounter (Signed)
Ok to refill??  Last office visit/ refill 03/25/2018.

## 2018-04-20 ENCOUNTER — Other Ambulatory Visit: Payer: Self-pay | Admitting: Endocrinology

## 2018-04-20 ENCOUNTER — Encounter: Payer: Self-pay | Admitting: Family Medicine

## 2018-04-21 ENCOUNTER — Other Ambulatory Visit: Payer: Self-pay | Admitting: Endocrinology

## 2018-04-22 ENCOUNTER — Telehealth: Payer: Self-pay | Admitting: *Deleted

## 2018-04-22 MED ORDER — VALACYCLOVIR HCL 1 G PO TABS: 1000.0000 mg | ORAL_TABLET | Freq: Every day | ORAL | 3 refills | Status: DC | PRN

## 2018-04-22 MED ORDER — SEMAGLUTIDE(0.25 OR 0.5MG/DOS) 2 MG/1.5ML ~~LOC~~ SOPN: 0.5000 mg | PEN_INJECTOR | SUBCUTANEOUS | 2 refills | Status: DC

## 2018-04-22 MED ORDER — INSULIN DEGLUDEC 100 UNIT/ML ~~LOC~~ SOPN: PEN_INJECTOR | SUBCUTANEOUS | 3 refills | Status: DC

## 2018-04-22 NOTE — Telephone Encounter (Signed)
Received request from pharmacy for PA on Oxycone/ APAP.   PA submitted via [employer].   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  Received immediate determination.   PA [id number] approved 04/22/2018 - 10/19/2018.  Pharmacy and patient made aware.

## 2018-05-12 ENCOUNTER — Other Ambulatory Visit: Payer: Self-pay | Admitting: Family Medicine

## 2018-05-15 ENCOUNTER — Telehealth: Payer: Self-pay | Admitting: Family Medicine

## 2018-05-15 NOTE — Telephone Encounter (Signed)
Pt would like [place] to go ahead and call in cream/ointment for her dry/rough elbows that had been previously discussed to [employer] .   Also pt is having difficulty with her pain meds not lasting as long as they should for her pain. Wants to know if we can just change it instead of her coming in for another ov.

## 2018-05-17 NOTE — Telephone Encounter (Signed)
Left message return call

## 2018-05-17 NOTE — Telephone Encounter (Signed)
I dont know what cream she is talking about, usually use Eucerin or Aveeno Also needs OV for pain meds, as her meds were just changed

## 2018-05-20 ENCOUNTER — Ambulatory Visit: Payer: Medicaid Other | Admitting: Family Medicine

## 2018-05-20 ENCOUNTER — Other Ambulatory Visit: Payer: Self-pay

## 2018-05-20 ENCOUNTER — Encounter: Payer: Self-pay | Admitting: Family Medicine

## 2018-05-20 VITALS — BP 122/68 | HR 82 | Temp 97.9°F | Resp 12 | Ht 64.0 in | Wt 171.0 lb

## 2018-05-20 DIAGNOSIS — H9203 Otalgia, bilateral: Secondary | ICD-10-CM

## 2018-05-20 DIAGNOSIS — G6289 Other specified polyneuropathies: Secondary | ICD-10-CM

## 2018-05-20 DIAGNOSIS — G894 Chronic pain syndrome: Secondary | ICD-10-CM | POA: Diagnosis not present

## 2018-05-20 MED ORDER — PREGABALIN 75 MG PO CAPS: 75.0000 mg | ORAL_CAPSULE | Freq: Two times a day (BID) | ORAL | 2 refills | Status: DC

## 2018-05-20 MED ORDER — OXYCODONE-ACETAMINOPHEN 5-325 MG PO TABS: 1.0000 | ORAL_TABLET | Freq: Three times a day (TID) | ORAL | 0 refills | Status: DC | PRN

## 2018-05-20 MED ORDER — NEOMYCIN-POLYMYXIN-HC 3.5-10000-1 OT SOLN: 3.0000 [drp] | Freq: Four times a day (QID) | OTIC | 0 refills | Status: DC

## 2018-05-20 NOTE — Progress Notes (Signed)
Subjective:    Patient ID: Kathryn Oconnell, female    DOB: 1965-08-04, 53 y.o.   MRN: [id number]  Patient presents for Medication Management (pain medication is not working); Elbow Discoloration (has dark elbows and is concerned it's D/T DM); and Ear Pain (states that she has burning like pain in B ear canals)   Hyperpigmentation for years on both elbows, she saw dermatology in [place] told cosmetic and should consider done but she cannot afford it.  She has tried over-the-counter skin like nurse.  Thought there was some other medication she could use.  She asked to try picking at her skin to see if it would come off wishes that the dark scab  Chronic pain- taking percocet, her feet are severe, feels like she has weebing, neuropathy , taking lyrica twice a day - taking 50mg  twice a day  Her Percocet is not helping states that she tried spacing out her medications but was unable to last because of severe pain but feel she needs something stronger.    Has appt next Tuesday with Endocrinology he fell asleep on the job after taking her diabetes medications now she has to get cleared by endocrinology to go back to work.   Ear pain burning sensation- taking nasal spray, has known enlarged turbinates.  She is seeing ENT last in the fall.  She has been on amoxicillin last in January often goes to urgent care and gets put on antibiotics every couple of months.  She is not taking any oral antihistamines at this time.  Review Of Systems:  GEN- denies fatigue, fever, weight loss,weakness, recent illness HEENT- denies eye drainage, change in vision, nasal discharge, CVS- denies chest pain, palpitations RESP- denies SOB, cough, wheeze ABD- denies N/V, change in stools, abd pain GU- denies dysuria, hematuria, dribbling, incontinence MSK+ joint pain, muscle aches, injury Neuro- denies headache, dizziness, syncope, seizure activity       Objective:    BP 122/68   Pulse 82   Temp 97.9 F (36.6 C)  (Oral)   Resp 12   Ht 5\' 4"  (1.626 m)   Wt 171 lb (77.6 kg)   LMP 06/18/2013 Comment: irregular  SpO2 98%   BMI 29.35 kg/m  GEN- NAD, alert and oriented x3 HEENT- PERRL, EOMI, non injected sclera, pink conjunctiva, MMM, oropharynx clear, Nares clear rhinorrhea, enlarged turbinates, TM clear no effusion, canals no erythema or swelling, no discharge  Neck- Supple, no LAD CVS- RRR, no murmur RESP-CTAB ABD-NABS,soft,NT,ND EXT- No edema Skin- darkened skin bilat elbows, also has hyperpigmented scarring on legs  Pulses- Radial 2+        Assessment & Plan:      Problem List Items Addressed This Visit      Unprioritized   Chronic pain syndrome    Hip pain is secondary to severe peripheral neuropathy and leg pain.  Has Been on chronic narcotics for greater than 5 years.  Will refer her to a pain clinic will make above changes for the Lyrica      Peripheral neuropathy    Her neuropathy is a source of her chronic pain as well as chronic back pain.  We will increase her Lyrica to 75 mg twice a day.  She still has 50 mg tablets and will take 1 capsule 3 times a day.  She will be referred to a pain clinic in general as she feels her oxycodone is not helping and she is also already on benzodiazepine.  Relevant Medications   pregabalin (LYRICA) 75 MG capsule    Other Visit Diagnoses    Otalgia of both ears    -  Primary   No overt infection, but will try topical antibiotic drop, as she continues to get oral meds, also add back anti-histamine, F/U ENT not improved instead of UC      Note: This dictation was prepared with Dragon dictation along with smaller phrase technology. Any transcriptional errors that result from this process are unintentional.

## 2018-05-20 NOTE — Assessment & Plan Note (Signed)
Hip pain is secondary to severe peripheral neuropathy and leg pain.  Has Been on chronic narcotics for greater than 5 years.  Will refer her to a pain clinic will make above changes for the Lyrica

## 2018-05-20 NOTE — Assessment & Plan Note (Signed)
Her neuropathy is a source of her chronic pain as well as chronic back pain.  We will increase her Lyrica to 75 mg twice a day.  She still has 50 mg tablets and will take 1 capsule 3 times a day.  She will be referred to a pain clinic in general as she feels her oxycodone is not helping and she is also already on benzodiazepine.

## 2018-05-20 NOTE — Patient Instructions (Addendum)
Increase Lyrica to 75mg  twice a day ( for now take 50mg  three times a day ) Referral to pain clinic  Use ear drops Call your ENT if not improved F/U as previous

## 2018-05-20 NOTE — Telephone Encounter (Signed)
Spoke with patient and informed her of creams Dr. Buelah Manis recommended. Also informed her that she is due and office visit to discuss pain medications. Patient verbalized understanding and scheduled an appointment today.

## 2018-05-21 ENCOUNTER — Other Ambulatory Visit: Payer: Self-pay | Admitting: Family Medicine

## 2018-05-22 NOTE — Telephone Encounter (Signed)
Ok to refill??  Last office visit 05/20/2018.  Last refill 01/11/2019, #1 refill.

## 2018-05-23 ENCOUNTER — Telehealth: Payer: Self-pay | Admitting: *Deleted

## 2018-05-23 NOTE — Telephone Encounter (Signed)
Received request from pharmacy for PA on Lyrica.   Patient has failed Gabapentin and Cymbalta.  PA submitted.   Dx: M79.2- neuropathy   Received immediate determination.   PA [id number] approved 05/23/2018- 05/23/2019.

## 2018-05-28 ENCOUNTER — Other Ambulatory Visit (HOSPITAL_COMMUNITY): Payer: Self-pay | Admitting: Family Medicine

## 2018-05-28 ENCOUNTER — Ambulatory Visit: Payer: Self-pay | Admitting: Endocrinology

## 2018-05-28 DIAGNOSIS — Z1231 Encounter for screening mammogram for malignant neoplasm of breast: Secondary | ICD-10-CM

## 2018-05-30 ENCOUNTER — Other Ambulatory Visit: Payer: Self-pay | Admitting: Family Medicine

## 2018-05-30 ENCOUNTER — Other Ambulatory Visit: Payer: Self-pay | Admitting: Endocrinology

## 2018-05-30 NOTE — Progress Notes (Signed)
Patient ID: NECHA HARRIES, female   DOB: 12-Apr-1965, 53 y.o.   MRN: [id number]           Reason for Appointment: Follow-up for Type 2 Diabetes and related problems  Referring physician: Dr. Buelah Manis   History of Present Illness:          Date of diagnosis of type 2 diabetes mellitus: 2000        Background history:   She had been on metformin for a few years but this was stopped in 1/16 when her renal function was impaired. She says she had been doing fairly well with this Trulicity 2.50 mg weekly In 10/16 she had significant hyperglycemia while taking oral hypoglycemic drugs and she was started on insulin She was switched back to oral agents with glipizide and Onglyza in 1/17 However subsequently blood sugars started going up and she was back on insulin in April 2017  Recent history:   INSULIN regimen is:  Lantus 30 in am, Novolog  8-15 U with  Meals bid  Non-insulin hypoglycemic drugs the patient is taking are: Ozempic 0.5 mg weekly starting 03/21/17  A1c is recently better at 7.7, has been as low as 6.8  Current management, blood sugar patterns and problems identified:  She has been regular with her follow-up, last visit was in 6/19  She is complaining about weight gain likely to be from her difficulty with watching her diet over the last few months  However she also appears to have tendency to hypoglycemia as seen on her freestyle libre which shows 19% of her readings below 70  She does not adjust her Lantus based on her blood sugar trends in the morning and overnight  Most of her hypoglycemia is overnight but also some events late in the evening when she is trying to correct the high readings later in the day  HIGHEST blood sugars are about midday and as high as 234 between 2-4 PM PM  MEALTIME insulin is likely to be inadequate in the morning at lunchtime, may be taking less insulin when she starts up with relatively low readings in the morning  Also may not always take  her insulin for her lunch meal because of being busy at work  No side effects from [place] currently  She does work  1st shift but her mealtimes are irregular  Recently not doing any significant walking  HYPOGLYCEMIA minimal recently with a couple of days she was low overnight       Side effects from medications have been: None  Compliance with the medical regimen: Fair  HYPOGLYCEMIA: May feel shaky when symptomatic, does not always have symptoms with low sugar readings  Glucose monitoring:        Glucometer:  Libre  Blood Glucose readings as below  CGM use % of time  70  2-week average/SD  151  Time in range  51     %  % Time Above 180  15  % Time above 250  15  % Time Below 70  19     PRE-MEAL Fasting Lunch Dinner Bedtime Overall  Glucose range:       Averages:  118   199  106  151+/-58   POST-MEAL PC Breakfast PC Lunch PC Dinner  Glucose range:     Averages:  194  234  163     PREVIOUS readings:  PRE-MEAL Fasting Lunch Dinner Bedtime Overall  Glucose range:       Mean/median:  103  116  147  127   POST-MEAL PC Breakfast PC Lunch PC Dinner  Glucose range:     Mean/median:    132   Self-care: The diet that the patient has been following is: Reducing portions Meal times are:  Breakfast is variable, lunch: 2 Dinner: 7 PM   Typical meal intake: Breakfast usually not cereal, rarely eating fast food biscuits.  Evening meal is meat or chicken with potatoes, greens, macaroni.  Snacks on chips.  She will drink water and some juice, usually diet drinks                Dietician visit, most recent: 6/17               Exercise:  None, was previously walking 3/7 days, about 30 minutes in the evening  Weight history: Previous range 119-196  Wt Readings from Last 3 Encounters:  05/31/18 172 lb 12.8 oz (78.4 kg)  05/20/18 171 lb (77.6 kg)  03/25/18 168 lb (76.2 kg)    Glycemic control:   Lab Results  Component Value Date   HGBA1C 7.7 (H) 03/25/2018   HGBA1C 8.7  (A) 09/27/2017   HGBA1C 6.8 03/22/2017   Lab Results  Component Value Date   MICROALBUR 0.4 03/25/2018   LDLCALC 98 11/07/2017   CREATININE 1.35 (H) 03/25/2018   Lab Results  Component Value Date   MICRALBCREAT 10 03/25/2018    Other active problems discussed today: See review of systems   Allergies as of 05/31/2018      Reactions   Erythromycin Other (See Comments)   Stomach cramps   Imitrex [sumatriptan] Other (See Comments)   Migraine and increased BP   Sulfa Antibiotics Other (See Comments)   Causes stomach cramps   Topamax [topiramate] Nausea And Vomiting   Nausea and worsening      Medication List       Accurate as of May 31, 2018 11:59 PM. Always use your most recent med list.        amLODipine 10 MG tablet Commonly known as:  NORVASC Take 0.5 tablets (5 mg total) by mouth daily.   aspirin 81 MG tablet Take 81 mg by mouth daily.   atorvastatin 40 MG tablet Commonly known as:  LIPITOR Take 1 tablet (40 mg total) by mouth daily.   Azelastine-Fluticasone 137-50 MCG/ACT Susp Place into the nose.   chlorhexidine 0.12 % solution Commonly known as:  PERIDEX USE AS DIRECTED 15 MLS IN THE MOUTH OR THROAT TWICE DAILY   cyclobenzaprine 10 MG tablet Commonly known as:  FLEXERIL TAKE 1 TABLET BY MOUTH THREE TIMES DAILY AS NEEDED   FREESTYLE LIBRE 14 DAY SENSOR Misc 1 each by Other route every 14 (fourteen) days. Apply 1 sensor to body once every 14 days to monitor blood sugar.   Insulin Pen Needle 32G X 6 MM Misc Commonly known as:  BD PEN NEEDLE MICRO U/F Use as directed to inject insulin SQ.   LANTUS SOLOSTAR 100 UNIT/ML Solostar Pen Generic drug:  Insulin Glargine Inject 30 Units into the skin daily. INJECT 30 UNITS UNDER THE SKIN EVERY MORNING.   levothyroxine 100 MCG tablet Commonly known as:  SYNTHROID, LEVOTHROID TAKE 1 TABLET BY MOUTH ONCE DAILY   LORazepam 1 MG tablet Commonly known as:  ATIVAN TAKE 1 TABLET BY MOUTH THREE TIMES DAILY  AS NEEDED FOR ANXIETY   medroxyPROGESTERone 2.5 MG tablet Commonly known as:  PROVERA TAKE 1 TABLET BY MOUTH ONCE DAILY  metoprolol tartrate 25 MG tablet Commonly known as:  LOPRESSOR TAKE 1 TABLET BY MOUTH TWICE DAILY   neomycin-polymyxin-hydrocortisone OTIC solution Commonly known as:  CORTISPORIN Place 3 drops into both ears 4 (four) times daily. For 1 week   NOVOLOG FLEXPEN 100 UNIT/ML FlexPen Generic drug:  insulin aspart Inject 15 Units into the skin 3 (three) times daily with meals. INJECT 15 UNITS UNDER THE SKIN TWICE DAILY.   oxyCODONE-acetaminophen 5-325 MG tablet Commonly known as:  PERCOCET Take 1 tablet by mouth every 8 (eight) hours as needed for severe pain.   pregabalin 75 MG capsule Commonly known as:  LYRICA Take 1 capsule (75 mg total) by mouth 2 (two) times daily.   Semaglutide(0.25 or 0.5MG /DOS) 2 MG/1.5ML Sopn Commonly known as:  OZEMPIC (0.25 OR 0.5 MG/DOSE) Inject 0.5 mg into the skin once a week.   sertraline 100 MG tablet Commonly known as:  ZOLOFT Take 1 tablet (100 mg total) by mouth daily.   valACYclovir 1000 MG tablet Commonly known as:  VALTREX Take 1 tablet (1,000 mg total) by mouth daily as needed.   zolpidem 12.5 MG CR tablet Commonly known as:  AMBIEN CR Take 1 tablet (12.5 mg total) by mouth at bedtime as needed.       Allergies:  Allergies  Allergen Reactions  . Erythromycin Other (See Comments)    Stomach cramps  . Imitrex [Sumatriptan] Other (See Comments)    Migraine and increased BP  . Sulfa Antibiotics Other (See Comments)    Causes stomach cramps  . Topamax [Topiramate] Nausea And Vomiting    Nausea and worsening    Past Medical History:  Diagnosis Date  . Anxiety   . Chronic kidney disease   . COPD (chronic obstructive pulmonary disease) ([place])   . Depression   . Diabetes mellitus without complication ([place])   . Eczema   . Graves disease   . HSV-2 infection   . Hyperlipidemia   . Hypertension   .  Hypothyroid   . Lung nodule   . Menopausal vaginal dryness 02/24/2014  . Migraines   . Neuropathy   . Tobacco use   . Trouble in sleeping 06/25/2015    Past Surgical History:  Procedure Laterality Date  . CESAREAN SECTION     x 2  . FOOT SURGERY     5th digit left foot / Great toe, 2ND TOE  . THYROID SURGERY      Family History  Problem Relation Age of Onset  . Stroke Mother        x 3  . Cancer Mother        breast   . Thyroid disease Mother   . Hyperlipidemia Mother   . Cancer Father        stomach cancer   . Hypertension Father   . Heart attack Father 74  . Gout Brother   . Migraines Son   . Hypertension Paternal Uncle   . Heart disease Maternal Grandmother   . Heart attack Maternal Grandmother   . Heart disease Maternal Grandfather   . Heart attack Maternal Grandfather   . Cancer Paternal Grandmother   . Diabetes Paternal Grandfather   . Asthma Daughter     Social History:  reports that she has been smoking cigarettes. She has a 17.50 pack-year smoking history. She has never used smokeless tobacco. She reports that she does not drink alcohol or use drugs.   Review of Systems        NEUROPATHY:  She has been on Lyrica 75mg   for symptomatic relief   However even with taking oxycodone she has inadequate relief and also other musculoskeletal pains She says she did not benefit from gabapentin and Cymbalta previously  Lipid history:  on Lipitor 40 mg prescribed by PCP, last LDL below 100    Lab Results  Component Value Date   CHOL 156 11/07/2017   HDL 43 (L) 11/07/2017   LDLCALC 98 11/07/2017   LDLDIRECT 95 10/04/2015   TRIG 68 11/07/2017   CHOLHDL 3.6 11/07/2017           Hypertension: mild, on metoprolol 12.5 mg twice a day along with 10 mg amlodipine She has been followed by PCP    BP Readings from Last 3 Encounters:  05/31/18 (!) 142/86  05/20/18 122/68  03/25/18 124/68    Most recent eye exam was In 05/2016 Last foot exam  9/18   HYPOTHYROIDISM:  She is on long-term supplementation with levothyroxine after surgery The dose was increased from 88 up to 100 g previously  Followed by PCP now  TSH levels as below:  Lab Results  Component Value Date   TSH 0.71 03/25/2018   TSH 0.63 06/27/2017   TSH 0.67 01/22/2017   FREET4 1.1 03/25/2018   FREET4 1.1 06/27/2017   FREET4 1.10 08/25/2016   Depression: She is currently on 100 mg of Zoloft     Physical Examination:  BP (!) 142/86 (BP Location: Left Arm, Patient Position: Sitting, Cuff Size: Normal)   Pulse 79   Ht 5\' 4"  (1.626 m)   Wt 172 lb 12.8 oz (78.4 kg)   LMP 06/18/2013 Comment: irregular  SpO2 97%   BMI 29.66 kg/m      ASSESSMENT:  Diabetes type 2, insulin requiring  See history of present illness for detailed discussion of current diabetes management, blood sugar patterns and problems identified  She is on a regimen of basal bolus insulin and Ozempic, 0.5 mg weekly  Her last A1c was 7.7  Blood sugars are poorly controlled with periods of hyperglycemia and hypoglycemia discussed in detail above  CGM results were reviewed with patient and blood sugar patterns analyzed She is getting too much basal insulin and frequently not enough mealtime insulin Also has over correcting tendency for high readings especially in the evenings when she is eating smaller meals  Explained to her that with 19% of her readings below 70 she will have a tendency to weight gain along with lack of exercise  NEUROPATHY: She is not getting relief with Lyrica and waiting for pain management currently  Hypertension: Blood pressure is variable, no recent microalbuminuria  HYPOTHYROID: Will follow-up with her TSH level on the next visit  LIPIDS: Adequately controlled on 40 mg Lipitor   PLAN:     Reduce basal insulin by 6 units Given her a flowsheet to adjust the basal insulin based on fasting readings every 3 days She does need to take adequate insulin  at breakfast and lunch Probably needs 14 to 16 units of NovoLog for her main meals and in the evening Also reminded her not to taking NovoLog to make corrections after evening meals  Encouraged her to see the dietitian for meal planning Discussed blood sugar targets at various times Needs more regular follow-up   Counseling time on subjects related to diabetes and neuropathy discussed in assessment and plan sections is over 50% of today's 25 minute visit   Patient Instructions  Lantus 24 units daily  Elayne Snare 06/02/2018, 6:06 PM   Note: This office note was prepared with Dragon voice recognition system technology. Any transcriptional errors that result from this process are unintentional.

## 2018-05-31 ENCOUNTER — Telehealth: Payer: Self-pay | Admitting: *Deleted

## 2018-05-31 ENCOUNTER — Telehealth: Payer: Self-pay | Admitting: Family Medicine

## 2018-05-31 ENCOUNTER — Other Ambulatory Visit: Payer: Self-pay

## 2018-05-31 ENCOUNTER — Encounter: Payer: Self-pay | Admitting: Endocrinology

## 2018-05-31 ENCOUNTER — Other Ambulatory Visit: Payer: Self-pay | Admitting: Family Medicine

## 2018-05-31 ENCOUNTER — Ambulatory Visit (HOSPITAL_COMMUNITY)
Admission: RE | Admit: 2018-05-31 | Discharge: 2018-05-31 | Disposition: A | Discharge status: Home / Self Care | Payer: Medicaid Other | Source: Ambulatory Visit | Attending: Family Medicine | Admitting: Family Medicine

## 2018-05-31 ENCOUNTER — Ambulatory Visit (INDEPENDENT_AMBULATORY_CARE_PROVIDER_SITE_OTHER): Payer: Medicaid Other | Admitting: Endocrinology

## 2018-05-31 VITALS — BP 142/86 | HR 79 | Ht 64.0 in | Wt 172.8 lb

## 2018-05-31 DIAGNOSIS — Z1231 Encounter for screening mammogram for malignant neoplasm of breast: Secondary | ICD-10-CM | POA: Insufficient documentation

## 2018-05-31 DIAGNOSIS — E1142 Type 2 diabetes mellitus with diabetic polyneuropathy: Secondary | ICD-10-CM

## 2018-05-31 DIAGNOSIS — Z794 Long term (current) use of insulin: Secondary | ICD-10-CM | POA: Diagnosis not present

## 2018-05-31 DIAGNOSIS — E1165 Type 2 diabetes mellitus with hyperglycemia: Secondary | ICD-10-CM | POA: Diagnosis not present

## 2018-05-31 MED ORDER — FREESTYLE LIBRE 14 DAY SENSOR MISC: 1.0000 | 3 refills | Status: DC

## 2018-05-31 MED ORDER — INSULIN PEN NEEDLE 32G X 6 MM MISC: 0 refills | Status: DC

## 2018-05-31 NOTE — Telephone Encounter (Signed)
Received call from patient.   Reports that she was given Pregabalin 50mg  instead of increased dose of 75mg . Also states that the name brand Lyrica works much better than the generic Pregabalin.   [place] [id number] completed and approved via [place] [employer] for name brand Lyrica.   Call placed to [place]. Was advised that Pregabalin 75mg  was dispensed on 05/23/2018 with a qty of 60 tabs for 30 day supply.   Call placed to patient to inquire. [place].

## 2018-05-31 NOTE — Telephone Encounter (Signed)
Please see prior note

## 2018-05-31 NOTE — Telephone Encounter (Signed)
Pt uses [facility] in [place]. Pt ph [phone].  Pt states she picked up generic Lyrica 75 mg last night. Pt states she always gets name brand Lyrica. Pt request return call so she can get correct brand name of Lyrica sent to her pharmacy.

## 2018-05-31 NOTE — Patient Instructions (Signed)
Lantus 24 units daily

## 2018-05-31 NOTE — Telephone Encounter (Signed)
Requesting refill    Flexeril  LOV: 05/20/18  LRF:  04/12/18

## 2018-05-31 NOTE — Telephone Encounter (Signed)
Patient aware per MyChart.   

## 2018-06-04 ENCOUNTER — Other Ambulatory Visit: Payer: Self-pay

## 2018-06-04 MED ORDER — OMNIPOD DASH PODS (GEN 4) MISC: 1.0000 | 11 refills | Status: DC

## 2018-06-12 ENCOUNTER — Telehealth: Payer: Self-pay | Admitting: Nutrition

## 2018-06-12 NOTE — Telephone Encounter (Signed)
Message left on voice mail with dates of upcoming pump start.  First available is Monday 23,  Telephone number given to call me back

## 2018-06-13 ENCOUNTER — Other Ambulatory Visit: Payer: Self-pay | Admitting: Family Medicine

## 2018-06-18 DIAGNOSIS — M545 Low back pain: Secondary | ICD-10-CM | POA: Diagnosis not present

## 2018-06-18 DIAGNOSIS — Z79891 Long term (current) use of opiate analgesic: Secondary | ICD-10-CM | POA: Diagnosis not present

## 2018-06-18 DIAGNOSIS — G894 Chronic pain syndrome: Secondary | ICD-10-CM | POA: Diagnosis not present

## 2018-06-18 DIAGNOSIS — Z79899 Other long term (current) drug therapy: Secondary | ICD-10-CM | POA: Diagnosis not present

## 2018-06-18 DIAGNOSIS — G629 Polyneuropathy, unspecified: Secondary | ICD-10-CM | POA: Diagnosis not present

## 2018-06-20 ENCOUNTER — Ambulatory Visit: Payer: Self-pay | Admitting: Dietician

## 2018-06-20 ENCOUNTER — Telehealth: Payer: Self-pay | Admitting: Dietician

## 2018-06-20 NOTE — Telephone Encounter (Signed)
Called patient who missed her 9:00 am appointment today. She states that she overslept. She has Omnipod DASH pump training Monday and needs to learn carbohydrate counting. She will see me at noon Monday for carbohydrate counting following her pump training.  Antonieta Iba, RD, LDN, CDE

## 2018-06-24 ENCOUNTER — Encounter: Payer: Medicaid Other | Attending: Endocrinology | Admitting: Nutrition

## 2018-06-24 ENCOUNTER — Other Ambulatory Visit: Payer: Self-pay | Admitting: Adult Health

## 2018-06-24 ENCOUNTER — Encounter: Payer: Medicaid Other | Admitting: Dietician

## 2018-06-24 ENCOUNTER — Other Ambulatory Visit: Payer: Self-pay

## 2018-06-24 DIAGNOSIS — N183 Chronic kidney disease, stage 3 unspecified: Secondary | ICD-10-CM

## 2018-06-24 DIAGNOSIS — E119 Type 2 diabetes mellitus without complications: Secondary | ICD-10-CM | POA: Diagnosis not present

## 2018-06-24 DIAGNOSIS — E1165 Type 2 diabetes mellitus with hyperglycemia: Secondary | ICD-10-CM | POA: Insufficient documentation

## 2018-06-24 DIAGNOSIS — Z794 Long term (current) use of insulin: Secondary | ICD-10-CM | POA: Insufficient documentation

## 2018-06-24 DIAGNOSIS — E1122 Type 2 diabetes mellitus with diabetic chronic kidney disease: Secondary | ICD-10-CM

## 2018-06-24 NOTE — Progress Notes (Signed)
Medical Nutrition Therapy:  Appt start time: 1200 end time:  1610.   Assessment:  Primary concerns today: Patient is here today alone.  She just had training on the Omnipod DASH and stated that she feels comfortable with this.  She is here to learn carbohydrate counting. She uses a [employer].  History includes graves disease, CKD, COPD, depression, sleep problems, HLD, and HTN.  She has had type 2 Diabetes since 2000. Labs include:  03/25/18 Potassium 5.4, BUN 17. Creatine 1.35, A1C 7.7% decreased from 8.7% 09/27/17. She was not aware of her elevated potassium and has never been told to be on a potassium restriction.  Weight hx: 140 lbs 172 lbs 05/31/2018 She states that she gained weight since starting on Ozempic.  She is frustrated by this.  Admits to emotional eating (craving sweets and chips). She was last seen by me 02/17/2016 She continues to eat 2 meals per day and is not on a specific schedule.  Has not eaten yet today.  Patient lives with her son.  She does the shopping and cooking.  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    Preferred Learning Style:   No preference indicated   Learning Readiness:   Ready  MEDICATIONS: Novolog, ozempic.  See list for others.   Progress Towards Goal(s):  In progress.   Nutritional Diagnosis:  NB-1.1 Food and nutrition-related knowledge deficit As related to carbohydrate counting.  As evidenced by patient report.    Intervention:  Nutrition education for carbohydrate counting.  Discussed label reading, calorie king app, and carb counting by portion size.  Discussed how to calculate carbohydrates if portion size does not match the food label.  Showed sample meals and patient was able to demonstrate accurate carbohydrate counting..  Teaching Method Utilized:  Visual Auditory Hands on  Handouts given during visit include:  Meal plan card  Snack list  Barriers to learning/adherence to lifestyle change: none  Demonstrated  degree of understanding via:  Teach Back   Monitoring/Evaluation:  Dietary intake, exercise, and body weight prn.

## 2018-06-25 ENCOUNTER — Ambulatory Visit (INDEPENDENT_AMBULATORY_CARE_PROVIDER_SITE_OTHER): Payer: Medicaid Other | Admitting: Endocrinology

## 2018-06-25 ENCOUNTER — Other Ambulatory Visit: Payer: Self-pay

## 2018-06-25 ENCOUNTER — Encounter: Payer: Medicaid Other | Admitting: Nutrition

## 2018-06-25 DIAGNOSIS — E1165 Type 2 diabetes mellitus with hyperglycemia: Secondary | ICD-10-CM

## 2018-06-25 DIAGNOSIS — Z794 Long term (current) use of insulin: Secondary | ICD-10-CM

## 2018-06-25 NOTE — Progress Notes (Signed)
Patient ID: Kathryn Oconnell, female   DOB: [birth year], 53 y.o.   MRN: [id number]           Reason for Appointment: Follow-up for Type 2 Diabetes and related problems  Referring physician: Dr. Buelah Manis   History of Present Illness:          Date of diagnosis of type 2 diabetes mellitus: 2000        Background history:   She had been on metformin for a few years but this was stopped in 1/16 when her renal function was impaired. She says she had been doing fairly well with this Trulicity 4.40 mg weekly In 10/16 she had significant hyperglycemia while taking oral hypoglycemic drugs and she was started on insulin She was switched back to oral agents with glipizide and Onglyza in 1/17 However subsequently blood sugars started going up and she was back on insulin in April 2017  Recent history:   INSULIN regimen:  OMNI pod insulin pump with basal rates: Midnight-6 AM = 0.6, exam-9 PM = 0.7 and 9 PM = 0.6 Carbohydrate coverage 1: 10 with target 120-140 and correction 1: 50  Non-insulin hypoglycemic drugs the patient is taking are: Ozempic 0.5 mg weekly starting 03/21/17  A1c is last better at 7.7, has been as low as 6.8  Current management, blood sugar patterns and problems identified:  She has been started on the Omni pod insulin pump on 3/23  Although she started the pump yesterday around midday she only has one bolus with entering 28 g of carbohydrate for oatmeal at around 5-6 PM  With this her blood sugar was 88 before eating and 89 after  Because of her sugars being below 100 in the evening she did not bolus for her evening meal which included a baked potato Despite this her blood sugar did not go up after her evening meal and she did not bolus because of fear of getting low  However blood sugars on her sensor stayed fairly level through the night with a reading of 126 at 3 AM and 103 at 9:30 AM  She only had juice this morning without a bolus and her blood sugar is now 153  She  has been told to continue Ozempic She thinks she feels comfortable counting carbohydrates   HYPOGLYCEMIA minimal recently with a couple of days she was low overnight       Side effects from medications have been: None  Compliance with the medical regimen: Fair  HYPOGLYCEMIA: May feel shaky when symptomatic, does not always have symptoms with low sugar readings  Glucose monitoring:        Glucometer:  Libre   Recent blood sugar results as above  Blood Glucose readings previously:  CGM use % of time  70  2-week average/SD  151  Time in range  51     %  % Time Above 180  15  % Time above 250  15  % Time Below 70  19     PRE-MEAL Fasting Lunch Dinner Bedtime Overall  Glucose range:       Averages:  118   199  106  151+/-58   POST-MEAL PC Breakfast PC Lunch PC Dinner  Glucose range:     Averages:  194  234  163     Self-care: The diet that the patient has been following is: Reducing portions Meal times are:  Breakfast is variable, lunch: 2 Dinner: 7 PM   Typical meal intake: Breakfast  usually not cereal, rarely eating fast food biscuits.  Evening meal is meat or chicken with potatoes, greens, macaroni.  Snacks on chips.  She will drink water and some juice, usually diet drinks                Dietician visit, most recent: 6/17               Exercise:  None, was previously walking 3/7 days, about 30 minutes in the evening  Weight history: Previous range 119-196  Wt Readings from Last 3 Encounters:  05/31/18 172 lb 12.8 oz (78.4 kg)  05/20/18 171 lb (77.6 kg)  03/25/18 168 lb (76.2 kg)    Glycemic control:   Lab Results  Component Value Date   HGBA1C 7.7 (H) 03/25/2018   HGBA1C 8.7 (A) 09/27/2017   HGBA1C 6.8 03/22/2017   Lab Results  Component Value Date   MICROALBUR 0.4 03/25/2018   LDLCALC 98 11/07/2017   CREATININE 1.35 (H) 03/25/2018   Lab Results  Component Value Date   MICRALBCREAT 10 03/25/2018    Other active problems discussed today: See  review of systems   Allergies as of 06/25/2018      Reactions   Erythromycin Other (See Comments)   Stomach cramps   Imitrex [sumatriptan] Other (See Comments)   Migraine and increased BP   Sulfa Antibiotics Other (See Comments)   Causes stomach cramps   Topamax [topiramate] Nausea And Vomiting   Nausea and worsening      Medication List       Accurate as of June 25, 2018 11:36 AM. Always use your most recent med list.        amLODipine 10 MG tablet Commonly known as:  NORVASC Take 0.5 tablets (5 mg total) by mouth daily.   aspirin 81 MG tablet Take 81 mg by mouth daily.   atorvastatin 40 MG tablet Commonly known as:  LIPITOR Take 1 tablet by mouth once daily   Azelastine-Fluticasone 137-50 MCG/ACT Susp Place into the nose.   chlorhexidine 0.12 % solution Commonly known as:  PERIDEX USE AS DIRECTED 15 MLS IN THE MOUTH OR THROAT TWICE DAILY   cyclobenzaprine 10 MG tablet Commonly known as:  FLEXERIL TAKE 1 TABLET BY MOUTH THREE TIMES DAILY AS NEEDED   FreeStyle Libre 14 Day Sensor Misc 1 each by Other route every 14 (fourteen) days. Apply 1 sensor to body once every 14 days to monitor blood sugar.   Insulin Pen Needle 32G X 6 MM Misc Commonly known as:  BD Pen Needle Micro U/F Use as directed to inject insulin SQ.   Lantus SoloStar 100 UNIT/ML Solostar Pen Generic drug:  Insulin Glargine Inject 30 Units into the skin daily. INJECT 30 UNITS UNDER THE SKIN EVERY MORNING.   levothyroxine 100 MCG tablet Commonly known as:  SYNTHROID, LEVOTHROID TAKE 1 TABLET BY MOUTH ONCE DAILY   LORazepam 1 MG tablet Commonly known as:  ATIVAN TAKE 1 TABLET BY MOUTH THREE TIMES DAILY AS NEEDED FOR ANXIETY   medroxyPROGESTERone 2.5 MG tablet Commonly known as:  PROVERA TAKE 1 TABLET BY MOUTH ONCE DAILY   metoprolol tartrate 25 MG tablet Commonly known as:  LOPRESSOR TAKE 1 TABLET BY MOUTH TWICE DAILY   neomycin-polymyxin-hydrocortisone OTIC solution Commonly known  as:  CORTISPORIN Place 3 drops into both ears 4 (four) times daily. For 1 week   NovoLOG FlexPen 100 UNIT/ML FlexPen Generic drug:  insulin aspart Inject 15 Units into the skin 3 (three) times daily  with meals. INJECT 15 UNITS UNDER THE SKIN TWICE DAILY.   OmniPod Dash 5 Pack Misc 1 each by Does not apply route every 3 (three) days. APPLY 1 OMNIPOD TO BODY ONCE EVERY 3 DAYS FOR INSULIN DELIVERY.   oxyCODONE-acetaminophen 5-325 MG tablet Commonly known as:  Percocet Take 1 tablet by mouth every 8 (eight) hours as needed for severe pain.   pregabalin 75 MG capsule Commonly known as:  Lyrica Take 1 capsule (75 mg total) by mouth 2 (two) times daily.   Semaglutide(0.25 or 0.5MG /DOS) 2 MG/1.5ML Sopn Commonly known as:  Ozempic (0.25 or 0.5 MG/DOSE) Inject 0.5 mg into the skin once a week.   sertraline 100 MG tablet Commonly known as:  ZOLOFT Take 1 tablet (100 mg total) by mouth daily.   valACYclovir 1000 MG tablet Commonly known as:  Valtrex Take 1 tablet (1,000 mg total) by mouth daily as needed.   zolpidem 12.5 MG CR tablet Commonly known as:  AMBIEN CR Take 1 tablet (12.5 mg total) by mouth at bedtime as needed.       Allergies:  Allergies  Allergen Reactions  . Erythromycin Other (See Comments)    Stomach cramps  . Imitrex [Sumatriptan] Other (See Comments)    Migraine and increased BP  . Sulfa Antibiotics Other (See Comments)    Causes stomach cramps  . Topamax [Topiramate] Nausea And Vomiting    Nausea and worsening    Past Medical History:  Diagnosis Date  . Anxiety   . Chronic kidney disease   . COPD (chronic obstructive pulmonary disease) ([place])   . Depression   . Diabetes mellitus without complication ([place])   . Eczema   . Graves disease   . HSV-2 infection   . Hyperlipidemia   . Hypertension   . Hypothyroid   . Lung nodule   . Menopausal vaginal dryness 02/24/2014  . Migraines   . Neuropathy   . Tobacco use   . Trouble in sleeping 06/25/2015     Past Surgical History:  Procedure Laterality Date  . CESAREAN SECTION     x 2  . FOOT SURGERY     5th digit left foot / Great toe, 2ND TOE  . THYROID SURGERY      Family History  Problem Relation Age of Onset  . Stroke Mother        x 3  . Cancer Mother        breast   . Thyroid disease Mother   . Hyperlipidemia Mother   . Cancer Father        stomach cancer   . Hypertension Father   . Heart attack Father 28  . Gout Brother   . Migraines Son   . Hypertension Paternal Uncle   . Heart disease Maternal Grandmother   . Heart attack Maternal Grandmother   . Heart disease Maternal Grandfather   . Heart attack Maternal Grandfather   . Cancer Paternal Grandmother   . Diabetes Paternal Grandfather   . Asthma Daughter     Social History:  reports that she has been smoking cigarettes. She has a 17.50 pack-year smoking history. She has never used smokeless tobacco. She reports that she does not drink alcohol or use drugs.   Review of Systems     The following is a copy of the previous note:  NEUROPATHY: She has been on Lyrica 75mg   for symptomatic relief   However even with taking oxycodone she has inadequate relief and also other musculoskeletal  pains She says she did not benefit from gabapentin and Cymbalta previously  Lipid history:  on Lipitor 40 mg prescribed by PCP, last LDL below 100    Lab Results  Component Value Date   CHOL 156 11/07/2017   HDL 43 (L) 11/07/2017   LDLCALC 98 11/07/2017   LDLDIRECT 95 10/04/2015   TRIG 68 11/07/2017   CHOLHDL 3.6 11/07/2017           Hypertension: mild, on metoprolol 12.5 mg twice a day along with 10 mg amlodipine She has been followed by PCP    BP Readings from Last 3 Encounters:  05/31/18 (!) 142/86  05/20/18 122/68  03/25/18 124/68    Most recent eye exam was In 05/2016 Last foot exam 9/18   HYPOTHYROIDISM:  She is on long-term supplementation with levothyroxine after surgery The dose was increased from  88 up to 100 g previously  Followed by PCP now  TSH levels as below:  Lab Results  Component Value Date   TSH 0.71 03/25/2018   TSH 0.63 06/27/2017   TSH 0.67 01/22/2017   FREET4 1.1 03/25/2018   FREET4 1.1 06/27/2017   FREET4 1.10 08/25/2016   Depression: She is on 100 mg of Zoloft     Physical Examination:  LMP 06/18/2013 Comment: irregular     ASSESSMENT:  Diabetes type 2, insulin requiring  See history of present illness for detailed discussion of current diabetes management, blood sugar patterns and problems identified  She is on a regimen of basal bolus insulin now with the insulin pump and Ozempic, 0.5 mg weekly  Her last A1c was 7.7  Blood sugars are so far looking excellent with starting the insulin pump as discussed above With less basal insulin and almost no mealtime boluses since yesterday her blood sugars are still running normal and she only had a bolus of 2 units for her oatmeal yesterday otherwise no mealtime boluses Appears to be more sensitive to insulin with the pump She appears to be learning how to use the pump and was able to enter carbohydrate  PLAN:     Reduce basal rates by 0.5 throughout the day and will be instructed by the nurse educator on this Carbohydrate coverage 1: 12 She will need to periodically check her fingersticks to compare with the freestyle libre for accuracy Reviewed blood sugar management tomorrow again   There are no Patient Instructions on file for this visit.        Elayne Snare 06/25/2018, 11:36 AM   Note: This office note was prepared with Dragon voice recognition system technology. Any transcriptional errors that result from this process are unintentional.

## 2018-06-26 ENCOUNTER — Encounter: Payer: Self-pay | Admitting: Endocrinology

## 2018-06-26 ENCOUNTER — Ambulatory Visit (INDEPENDENT_AMBULATORY_CARE_PROVIDER_SITE_OTHER): Payer: Medicaid Other | Admitting: Endocrinology

## 2018-06-26 ENCOUNTER — Encounter: Payer: Self-pay | Admitting: Dietician

## 2018-06-26 ENCOUNTER — Ambulatory Visit: Payer: Self-pay | Admitting: Nutrition

## 2018-06-26 ENCOUNTER — Other Ambulatory Visit: Payer: Self-pay

## 2018-06-26 DIAGNOSIS — E1165 Type 2 diabetes mellitus with hyperglycemia: Secondary | ICD-10-CM

## 2018-06-26 DIAGNOSIS — Z794 Long term (current) use of insulin: Secondary | ICD-10-CM

## 2018-06-26 MED ORDER — GLUCOSE BLOOD VI STRP: ORAL_STRIP | 3 refills | Status: DC

## 2018-06-26 NOTE — Progress Notes (Signed)
Patient ID: Kathryn Oconnell, female   DOB: April 06, 1965, 53 y.o.   MRN: [id number]           Reason for Appointment: Follow-up for Type 2 Diabetes and related problems  Referring physician: Dr. Buelah Manis   History of Present Illness:          Date of diagnosis of type 2 diabetes mellitus: 2000        Background history:   She had been on metformin for a few years but this was stopped in 1/16 when her renal function was impaired. She says she had been doing fairly well with this Trulicity 0.27 mg weekly In 10/16 she had significant hyperglycemia while taking oral hypoglycemic drugs and she was started on insulin She was switched back to oral agents with glipizide and Onglyza in 1/17 However subsequently blood sugars started going up and she was back on insulin in April 2017  Recent history:   INSULIN regimen:  OMNI pod insulin pump with basal rates: Midnight-6 AM = 0.6, exam-9 PM = 0.7 and 9 PM = 0.6 Carbohydrate coverage 1: 10 with target 120-140 and correction 1: 50  Non-insulin hypoglycemic drugs the patient is taking are: Ozempic 0.5 mg weekly starting 03/21/17  A1c is last better at 7.7, has been as low as 6.8  Current management, blood sugar patterns and problems identified:  She has been started on the Omni pod insulin pump on 3/23 Although her blood sugars were excellent on the first day she has had inconsistent control.  Her blood sugar before breakfast yesterday was 103  However blood sugar after lunch was about 250 but this was from drinking regular soft drinks without a bolus  For her lunch she is bolused 10 units including a correction dose  However her on 7 PM she woke up from a nap with a blood sugar that was indicated low on her sensor and she was having only minimal symptoms.  With drinking grape juice her blood sugar finally came up to 129  Subsequently with a high-fat fast food meal her blood sugar went up to 255 and continued to be high until about 8 AM when it  was about 140 prior to be seen in the office   HYPOGLYCEMIA minimal recently with a couple of days she was low overnight       Side effects from medications have been: None  Compliance with the medical regimen: Fair  HYPOGLYCEMIA: May feel shaky when symptomatic, does not always have symptoms with low sugar readings  Glucose monitoring:        Glucometer:  Libre   Recent blood sugar results as above   Self-care: The diet that the patient has been following is: Reducing portions Meal times are:  Breakfast is variable, lunch: 2 Dinner: 7 PM   Typical meal intake: Breakfast usually not cereal, rarely eating fast food biscuits.  Evening meal is meat or chicken with potatoes, greens, macaroni.  Snacks on chips.  She will drink water and some juice, usually diet drinks                Dietician visit, most recent: 6/17               Exercise:  None, was previously walking 3/7 days, about 30 minutes in the evening  Weight history: Previous range 119-196  Wt Readings from Last 3 Encounters:  05/31/18 172 lb 12.8 oz (78.4 kg)  05/20/18 171 lb (77.6 kg)  03/25/18 168 lb (  76.2 kg)    Glycemic control:   Lab Results  Component Value Date   HGBA1C 7.7 (H) 03/25/2018   HGBA1C 8.7 (A) 09/27/2017   HGBA1C 6.8 03/22/2017   Lab Results  Component Value Date   MICROALBUR 0.4 03/25/2018   LDLCALC 98 11/07/2017   CREATININE 1.35 (H) 03/25/2018   Lab Results  Component Value Date   MICRALBCREAT 10 03/25/2018    Other active problems discussed today: See review of systems   Allergies as of 06/26/2018      Reactions   Erythromycin Other (See Comments)   Stomach cramps   Imitrex [sumatriptan] Other (See Comments)   Migraine and increased BP   Sulfa Antibiotics Other (See Comments)   Causes stomach cramps   Topamax [topiramate] Nausea And Vomiting   Nausea and worsening      Medication List       Accurate as of June 26, 2018  9:44 PM. Always use your most recent med list.         amLODipine 10 MG tablet Commonly known as:  NORVASC Take 0.5 tablets (5 mg total) by mouth daily.   aspirin 81 MG tablet Take 81 mg by mouth daily.   atorvastatin 40 MG tablet Commonly known as:  LIPITOR Take 1 tablet by mouth once daily   Azelastine-Fluticasone 137-50 MCG/ACT Susp Place into the nose.   chlorhexidine 0.12 % solution Commonly known as:  PERIDEX USE AS DIRECTED 15 MLS IN THE MOUTH OR THROAT TWICE DAILY   cyclobenzaprine 10 MG tablet Commonly known as:  FLEXERIL TAKE 1 TABLET BY MOUTH THREE TIMES DAILY AS NEEDED   FreeStyle Libre 14 Day Sensor Misc 1 each by Other route every 14 (fourteen) days. Apply 1 sensor to body once every 14 days to monitor blood sugar.   glucose blood test strip Use Contour Next test strips as instructed to check blood sugar 4 times daily.   Insulin Pen Needle 32G X 6 MM Misc Commonly known as:  BD Pen Needle Micro U/F Use as directed to inject insulin SQ.   Lantus SoloStar 100 UNIT/ML Solostar Pen Generic drug:  Insulin Glargine Inject 30 Units into the skin daily. INJECT 30 UNITS UNDER THE SKIN EVERY MORNING.   levothyroxine 100 MCG tablet Commonly known as:  SYNTHROID, LEVOTHROID TAKE 1 TABLET BY MOUTH ONCE DAILY   LORazepam 1 MG tablet Commonly known as:  ATIVAN TAKE 1 TABLET BY MOUTH THREE TIMES DAILY AS NEEDED FOR ANXIETY   medroxyPROGESTERone 2.5 MG tablet Commonly known as:  PROVERA TAKE 1 TABLET BY MOUTH ONCE DAILY   metoprolol tartrate 25 MG tablet Commonly known as:  LOPRESSOR TAKE 1 TABLET BY MOUTH TWICE DAILY   neomycin-polymyxin-hydrocortisone OTIC solution Commonly known as:  CORTISPORIN Place 3 drops into both ears 4 (four) times daily. For 1 week   NovoLOG FlexPen 100 UNIT/ML FlexPen Generic drug:  insulin aspart Inject 15 Units into the skin 3 (three) times daily with meals. INJECT 15 UNITS UNDER THE SKIN TWICE DAILY.   OmniPod Dash 5 Pack Misc 1 each by Does not apply route every 3  (three) days. APPLY 1 OMNIPOD TO BODY ONCE EVERY 3 DAYS FOR INSULIN DELIVERY.   oxyCODONE-acetaminophen 5-325 MG tablet Commonly known as:  Percocet Take 1 tablet by mouth every 8 (eight) hours as needed for severe pain.   pregabalin 75 MG capsule Commonly known as:  Lyrica Take 1 capsule (75 mg total) by mouth 2 (two) times daily.   Semaglutide(0.25 or  0.5MG /DOS) 2 MG/1.5ML Sopn Commonly known as:  Ozempic (0.25 or 0.5 MG/DOSE) Inject 0.5 mg into the skin once a week.   sertraline 100 MG tablet Commonly known as:  ZOLOFT Take 1 tablet (100 mg total) by mouth daily.   valACYclovir 1000 MG tablet Commonly known as:  Valtrex Take 1 tablet (1,000 mg total) by mouth daily as needed.   zolpidem 12.5 MG CR tablet Commonly known as:  AMBIEN CR Take 1 tablet (12.5 mg total) by mouth at bedtime as needed.       Allergies:  Allergies  Allergen Reactions  . Erythromycin Other (See Comments)    Stomach cramps  . Imitrex [Sumatriptan] Other (See Comments)    Migraine and increased BP  . Sulfa Antibiotics Other (See Comments)    Causes stomach cramps  . Topamax [Topiramate] Nausea And Vomiting    Nausea and worsening    Past Medical History:  Diagnosis Date  . Anxiety   . Chronic kidney disease   . COPD (chronic obstructive pulmonary disease) ([place])   . Depression   . Diabetes mellitus without complication ([place])   . Eczema   . Graves disease   . HSV-2 infection   . Hyperlipidemia   . Hypertension   . Hypothyroid   . Lung nodule   . Menopausal vaginal dryness 02/24/2014  . Migraines   . Neuropathy   . Tobacco use   . Trouble in sleeping 06/25/2015    Past Surgical History:  Procedure Laterality Date  . CESAREAN SECTION     x 2  . FOOT SURGERY     5th digit left foot / Great toe, 2ND TOE  . THYROID SURGERY      Family History  Problem Relation Age of Onset  . Stroke Mother        x 3  . Cancer Mother        breast   . Thyroid disease Mother   .  Hyperlipidemia Mother   . Cancer Father        stomach cancer   . Hypertension Father   . Heart attack Father 78  . Gout Brother   . Migraines Son   . Hypertension Paternal Uncle   . Heart disease Maternal Grandmother   . Heart attack Maternal Grandmother   . Heart disease Maternal Grandfather   . Heart attack Maternal Grandfather   . Cancer Paternal Grandmother   . Diabetes Paternal Grandfather   . Asthma Daughter     Social History:  reports that she has been smoking cigarettes. She has a 17.50 pack-year smoking history. She has never used smokeless tobacco. She reports that she does not drink alcohol or use drugs.   Review of Systems     The following is a copy of the previous note:  NEUROPATHY: She has been on Lyrica 75mg   for symptomatic relief   However even with taking oxycodone she has inadequate relief and also other musculoskeletal pains She says she did not benefit from gabapentin and Cymbalta previously  Lipid history:  on Lipitor 40 mg prescribed by PCP, last LDL below 100    Lab Results  Component Value Date   CHOL 156 11/07/2017   HDL 43 (L) 11/07/2017   LDLCALC 98 11/07/2017   LDLDIRECT 95 10/04/2015   TRIG 68 11/07/2017   CHOLHDL 3.6 11/07/2017           Hypertension: mild, on metoprolol 12.5 mg twice a day along with 10 mg amlodipine She  has been followed by PCP    BP Readings from Last 3 Encounters:  05/31/18 (!) 142/86  05/20/18 122/68  03/25/18 124/68    Most recent eye exam was In 05/2016 Last foot exam 9/18   HYPOTHYROIDISM:  She is on long-term supplementation with levothyroxine after surgery The dose was increased from 88 up to 100 g previously  Followed by PCP now  TSH levels as below:  Lab Results  Component Value Date   TSH 0.71 03/25/2018   TSH 0.63 06/27/2017   TSH 0.67 01/22/2017   FREET4 1.1 03/25/2018   FREET4 1.1 06/27/2017   FREET4 1.10 08/25/2016   Depression: She is on 100 mg of Zoloft     Physical  Examination:  LMP 06/18/2013 Comment: irregular     ASSESSMENT:  Diabetes type 2, insulin requiring  See history of present illness for detailed discussion of current diabetes management, blood sugar patterns and problems identified  She is on a regimen of basal bolus insulin now with the insulin pump and Ozempic, 0.5 mg weekly  Her last A1c was 7.7  Although she is still requiring relatively small amount of insulin on her pump and blood sugars are not as labile she had an unexpected hypoglycemic event yesterday before dinner This may have been related to either excessive basal insulin or overcorrection of her high reading around lunchtime Subsequently blood sugar was higher late in the evening after dinner because of a high-fat meal and rebound from the low sugar Blood sugar this morning was back to about 140 prior   PLAN:    Discussed avoiding high-fat foods and regular soft drinks She will need to consistently bolus of Humalog before eating For now we will reduce her basal rate between 5 PM and 9 PM down to 0.55 which will be continued till midnight No change in bolus settings as yet She will review blood sugars by uploading her data tomorrow and adjust her settings as needed She will need to be again reminded how to change her basal rates, set up her online accounts and continue to monitor blood sugar regularly before and after meals   There are no Patient Instructions on file for this visit.    Counseling time on subjects discussed in assessment and plan sections is over 50% of today's 25 minute visit     Elayne Snare 06/26/2018, 9:44 PM   Note: This office note was prepared with Dragon voice recognition system technology. Any transcriptional errors that result from this process are unintentional.

## 2018-06-26 NOTE — Progress Notes (Signed)
Kathryn Oconnell was instructed on how to use the OmniPod insulin pump.  Settings were put into the pump, per Dr. Ronnie Derby orders:  Basal rate: MN: 0.6, 6AM: 0.7, 9PM: 0.6,  I/C: 10, ISF: 50, timing 4 hours, target 120 with corrections over 140.   We discussed how this pump works, and she reported good understanding of this.  She filled a pod with Novolog insulin and applied it to her upper outer left arm.   We reviewed how to give a bolus and she re demonstrated this correctly X3.  She used to count carbs but is not currently doing this.  She has an appointment with the dietitian immedicately after this appointment and says she will go back to doing this once she gets the review.  She was instructed to take 10u for small meals, 14u for average meals, and 16u for larger meals.  Written instructions were given for this.  She had no final questions.   She is very concerned with low blood sugars and gaining so much weight.  We reviewed the symptoms and  treatment of low blood sugars, and why, on this pump, she will be experiencing less lows,and using less insulin, which will help her weight. She is wearing a Libre, and we discussed the difference between sensor readings and blood sugar.  She was encouraged to test her blood sugar, when there were arrows showing on the [place] reading.  She agreed to do this.  She had no final questions. She was strongly encouraged to sign up to glooko, and was given a instruction sheet to do this.  She agreed to set it up when she got home.

## 2018-06-26 NOTE — Patient Instructions (Signed)
Test blood sugars before meals and at bedtime. Make sure you put the blood sugar readings into the pump before giving the bolus

## 2018-06-26 NOTE — Patient Instructions (Signed)
Scan sensor, and if arrows, test blood sugar readings. Take 10u for smaller meals (100g of carb), 14u for average meals(140 g of carb), and 16u for larger meals (160g of carb). Call if questions.

## 2018-06-26 NOTE — Progress Notes (Signed)
Basal rates were changed by the patient will little assistance from me, per Dr. Ronnie Derby orders.  She was again encouraged to sign up for [facility], and agreed to do this.   We reviewed the history, and how to use the library to get carbs.  She reported good understanding of this. She appears very pleased with this pump, and reported no difficulty giving the boluses.  She does not eat any food before 3PM, and was encouraged to eat a little something in the AM, to increase her metabolism.  Suggestions were given for this. I will see her tomorrow to sign off the checklist.  She had no final questions for me.

## 2018-06-27 NOTE — Patient Instructions (Signed)
Read over resource guide, and manual Call 800 number if questions Call if blood sugars drop low

## 2018-06-27 NOTE — Progress Notes (Signed)
Reviewed how/when to use the temp basal rate, sick day guidelines, high blood sugar protocol.  We reviewed the checklist and she reported good understanding of all topics, and signed the checklist.  She was strongly encourage to sign up for the glooko, and was given a sheet on how to do this.  She promised to call in her user name and password for [place]. 12AM: 0.55, 7aM: 0.65, 5PM:0.55 Patient changed her settings, with little assistance from me, per Dr. Ronnie Derby written order:  Basal rate:  She was strongly encouraged to read over the resource guide when doing the pod change, and review all other topics.  She agreed to do this, and had no final questions.

## 2018-07-01 ENCOUNTER — Encounter: Payer: Self-pay | Admitting: Family Medicine

## 2018-07-01 ENCOUNTER — Other Ambulatory Visit: Payer: Self-pay | Admitting: Family Medicine

## 2018-07-02 MED ORDER — CYCLOBENZAPRINE HCL 10 MG PO TABS: 10.0000 mg | ORAL_TABLET | Freq: Three times a day (TID) | ORAL | 0 refills | Status: DC | PRN

## 2018-07-02 MED ORDER — ATORVASTATIN CALCIUM 40 MG PO TABS: 40.0000 mg | ORAL_TABLET | Freq: Every day | ORAL | 2 refills | Status: DC

## 2018-07-02 NOTE — Telephone Encounter (Signed)
Ok to refill Flexeril? 

## 2018-07-04 ENCOUNTER — Encounter: Payer: Self-pay | Admitting: Family Medicine

## 2018-07-04 ENCOUNTER — Other Ambulatory Visit: Payer: Self-pay | Admitting: Adult Health

## 2018-07-04 ENCOUNTER — Other Ambulatory Visit: Payer: Self-pay

## 2018-07-05 ENCOUNTER — Encounter: Payer: Self-pay | Admitting: Family Medicine

## 2018-07-08 ENCOUNTER — Other Ambulatory Visit: Payer: Self-pay | Admitting: Family Medicine

## 2018-07-08 DIAGNOSIS — M545 Low back pain, unspecified: Secondary | ICD-10-CM

## 2018-07-08 DIAGNOSIS — G8929 Other chronic pain: Secondary | ICD-10-CM

## 2018-07-08 DIAGNOSIS — G6289 Other specified polyneuropathies: Secondary | ICD-10-CM

## 2018-07-08 DIAGNOSIS — G894 Chronic pain syndrome: Secondary | ICD-10-CM

## 2018-07-09 ENCOUNTER — Encounter: Payer: Self-pay | Admitting: Family Medicine

## 2018-07-09 ENCOUNTER — Other Ambulatory Visit: Payer: Self-pay | Admitting: *Deleted

## 2018-07-09 MED ORDER — ZOLPIDEM TARTRATE ER 12.5 MG PO TBCR: 12.5000 mg | EXTENDED_RELEASE_TABLET | Freq: Every evening | ORAL | 2 refills | Status: DC | PRN

## 2018-07-09 NOTE — Telephone Encounter (Signed)
Received fax requesting refill on Ambien.   Ok to refill??  Last office visit 05/20/2018.  Last refill 04/12/2018, #2 refills.

## 2018-07-10 ENCOUNTER — Telehealth: Payer: Self-pay | Admitting: *Deleted

## 2018-07-10 NOTE — Telephone Encounter (Signed)
Received request from pharmacy for PA on Ambien.   PA submitted. Confirmation # [id number].  Dx: Insomnia (G47).  Received immediate determination.   PA [id number] approved 07/10/2018- 01/06/2019.  Pharmacy made aware.

## 2018-07-11 ENCOUNTER — Telehealth: Payer: Self-pay | Admitting: *Deleted

## 2018-07-11 MED ORDER — SEMAGLUTIDE(0.25 OR 0.5MG/DOS) 2 MG/1.5ML ~~LOC~~ SOPN: 0.5000 mg | PEN_INJECTOR | SUBCUTANEOUS | 2 refills | Status: DC

## 2018-07-11 NOTE — Telephone Encounter (Signed)
Received request from pharmacy for [place] on [place].   PA submitted.   Dx: E11.65- DM.  Received immediate determination.  PA [id number] approved 07/11/2018- 07/11/2019.

## 2018-07-15 DIAGNOSIS — E1142 Type 2 diabetes mellitus with diabetic polyneuropathy: Secondary | ICD-10-CM | POA: Diagnosis not present

## 2018-07-15 DIAGNOSIS — M129 Arthropathy, unspecified: Secondary | ICD-10-CM | POA: Diagnosis not present

## 2018-07-15 DIAGNOSIS — G8929 Other chronic pain: Secondary | ICD-10-CM | POA: Diagnosis not present

## 2018-07-15 DIAGNOSIS — E1165 Type 2 diabetes mellitus with hyperglycemia: Secondary | ICD-10-CM | POA: Diagnosis not present

## 2018-07-15 DIAGNOSIS — M545 Low back pain: Secondary | ICD-10-CM | POA: Diagnosis not present

## 2018-07-15 DIAGNOSIS — Z79899 Other long term (current) drug therapy: Secondary | ICD-10-CM | POA: Diagnosis not present

## 2018-07-15 DIAGNOSIS — Z79891 Long term (current) use of opiate analgesic: Secondary | ICD-10-CM | POA: Diagnosis not present

## 2018-07-15 DIAGNOSIS — G629 Polyneuropathy, unspecified: Secondary | ICD-10-CM | POA: Diagnosis not present

## 2018-07-22 ENCOUNTER — Encounter: Payer: Medicaid Other | Admitting: Endocrinology

## 2018-07-22 NOTE — Progress Notes (Signed)
This encounter was created in error - please disregard.

## 2018-07-25 ENCOUNTER — Encounter: Payer: Medicaid Other | Admitting: Endocrinology

## 2018-07-25 ENCOUNTER — Encounter: Payer: Self-pay | Admitting: Endocrinology

## 2018-07-25 ENCOUNTER — Other Ambulatory Visit: Payer: Self-pay

## 2018-07-25 NOTE — Progress Notes (Signed)
This encounter was created in error - please disregard.

## 2018-07-26 ENCOUNTER — Ambulatory Visit (INDEPENDENT_AMBULATORY_CARE_PROVIDER_SITE_OTHER): Payer: Medicaid Other | Admitting: Family Medicine

## 2018-07-26 DIAGNOSIS — E1122 Type 2 diabetes mellitus with diabetic chronic kidney disease: Secondary | ICD-10-CM

## 2018-07-26 DIAGNOSIS — N183 Chronic kidney disease, stage 3 unspecified: Secondary | ICD-10-CM

## 2018-07-26 DIAGNOSIS — J410 Simple chronic bronchitis: Secondary | ICD-10-CM

## 2018-07-26 DIAGNOSIS — E782 Mixed hyperlipidemia: Secondary | ICD-10-CM

## 2018-07-26 DIAGNOSIS — Z794 Long term (current) use of insulin: Secondary | ICD-10-CM | POA: Diagnosis not present

## 2018-07-26 DIAGNOSIS — I10 Essential (primary) hypertension: Secondary | ICD-10-CM | POA: Diagnosis not present

## 2018-07-26 DIAGNOSIS — G894 Chronic pain syndrome: Secondary | ICD-10-CM

## 2018-07-26 MED ORDER — LEVOTHYROXINE SODIUM 100 MCG PO TABS: 100.0000 ug | ORAL_TABLET | Freq: Every day | ORAL | 3 refills | Status: DC

## 2018-07-26 NOTE — Progress Notes (Signed)
Virtual Visit via Telephone Note  I connected with Kathryn Oconnell on 07/26/18 at 2:05  by telephone and verified that I am speaking with the correct person using two identifiers.    Pt location: at home  Physician location: at home, Vic Blackbird MD  on call- Patient and physician    Email- [email or web address]  I discussed the limitations, risks, security and privacy concerns of performing an evaluation and management service by telephone and the availability of in person appointments. I also discussed with the patient that there may be a patient responsible charge related to this service. The patient expressed understanding and agreed to proceed.   History of Present Illness: Chronic pain- peripheral neuropathy, back pain- referred to pain management last visit in Feb, pt called in wanting a new referral because she was upset with care at previous place, referred to [facility] medical, now on buprenorphine patch andnucynta, patch helped first three days but no longer helping has F/U next week for medication  adjustment   Peripheral neuropathy- last visit Lyrica increased to 75mg  BID , this has helped her neurpathy symptoms  Has f/u with Endocrinology this afternoon, blood sugars have been up and down  She was fired from her last job, thinks her sugar was abnormal, she feel asleep in the middle of the day Trying to get unemployment needs a letter stating her medical problems and risk for COVID,   HTN- bp 128/74 taking meds as prescribed, no side effects       Observations/Objective: Unable to visualize  Assessment and Plan:  DM- f/u endocrinology as scheduled  HTN- well controlled no changes  - Hypothyroidism - TFT at goal in December, also followed by endocrinology   Hyperlipidemia- LDL at goal in August 2019, no change to statin drug   Chronic pain- per pain clinic besides lyrica for neuropathy  COPD- no difficultywith breathing, no use of albuterol   Letter written documenting  her chronic medical problems, COVID-19 risk. To assist her unemployment application Follow Up Instructions:    I discussed the assessment and treatment plan with the patient. The patient was provided an opportunity to ask questions and all were answered. The patient agreed with the plan and demonstrated an understanding of the instructions.   The patient was advised to call back or seek an in-person evaluation if the symptoms worsen or if the condition fails to improve as anticipated.  I provided  19 minutes of non-face-to-face time during this encounter. End time 2:24pm  Vic Blackbird, MD

## 2018-07-28 ENCOUNTER — Encounter: Payer: Self-pay | Admitting: Family Medicine

## 2018-08-05 ENCOUNTER — Other Ambulatory Visit: Payer: Self-pay | Admitting: *Deleted

## 2018-08-05 ENCOUNTER — Telehealth: Payer: Self-pay | Admitting: Endocrinology

## 2018-08-05 NOTE — Telephone Encounter (Signed)
Received fax requesting alternative to Levothyroxine. Current brand is not available and requested approval to change to Euthyrox Brand.   MD please advise.

## 2018-08-05 NOTE — Telephone Encounter (Signed)
Per [facility], "caller states she needs a refill of Novalog vial, states she has enough to last until last until the office opens, please call in to [facility]."  Placed in box.

## 2018-08-05 NOTE — Telephone Encounter (Signed)
Recommend she stay on same brand, you can figure out what brand it is and see if she can get from another pharmacy

## 2018-08-06 ENCOUNTER — Other Ambulatory Visit: Payer: Self-pay | Admitting: Family Medicine

## 2018-08-06 ENCOUNTER — Telehealth: Payer: Self-pay

## 2018-08-06 ENCOUNTER — Other Ambulatory Visit: Payer: Self-pay

## 2018-08-06 MED ORDER — LEVOTHYROXINE SODIUM 100 MCG PO TABS: 100.0000 ug | ORAL_TABLET | Freq: Every day | ORAL | 3 refills | Status: DC

## 2018-08-06 MED ORDER — INSULIN ASPART 100 UNIT/ML ~~LOC~~ SOLN: SUBCUTANEOUS | 3 refills | Status: DC

## 2018-08-06 NOTE — Telephone Encounter (Signed)
Rx sent 

## 2018-08-06 NOTE — Telephone Encounter (Signed)
Okay to change, advise pt, of the change in brand this will be the brand she should stay on This is covered by her medicaid We need to check her TFT in 6 weeks make sure her body is tolerating

## 2018-08-06 NOTE — Telephone Encounter (Signed)
Maximum 50 units/day

## 2018-08-06 NOTE — Addendum Note (Signed)
Addended by: Sheral Flow on: 08/06/2018 02:15 PM   Modules accepted: Orders

## 2018-08-06 NOTE — Telephone Encounter (Signed)
Pt called after hours line and requested a refill for her Novolog vials to fill up her Omnipod insulin pump. Please provide maximum daily dose for this.

## 2018-08-06 NOTE — Telephone Encounter (Signed)
Call placed to [facility]. Was advised that patient current brand is Sandoz. States that pharmacy policy is switching to dye free brand (Euthyrox).   Call placed to multiple pharmacies in area. Sandoz brand is not carried. Mylan is seen predominantly in close pharmacies.   Per [facility] pharmacy, Euthyrox is very similar to Sandoz in efficacy, but recommends re-checking levels in 6-8 weeks after change.   MD please advise.

## 2018-08-07 DIAGNOSIS — Z79899 Other long term (current) drug therapy: Secondary | ICD-10-CM | POA: Diagnosis not present

## 2018-08-07 DIAGNOSIS — G8929 Other chronic pain: Secondary | ICD-10-CM | POA: Diagnosis not present

## 2018-08-07 DIAGNOSIS — E1142 Type 2 diabetes mellitus with diabetic polyneuropathy: Secondary | ICD-10-CM | POA: Diagnosis not present

## 2018-08-07 DIAGNOSIS — Z79891 Long term (current) use of opiate analgesic: Secondary | ICD-10-CM | POA: Diagnosis not present

## 2018-08-07 DIAGNOSIS — M545 Low back pain: Secondary | ICD-10-CM | POA: Diagnosis not present

## 2018-08-07 MED ORDER — CYCLOBENZAPRINE HCL 10 MG PO TABS: 10.0000 mg | ORAL_TABLET | Freq: Three times a day (TID) | ORAL | 0 refills | Status: DC | PRN

## 2018-08-07 NOTE — Telephone Encounter (Signed)
Ok to refill 

## 2018-08-22 ENCOUNTER — Other Ambulatory Visit: Payer: Self-pay | Admitting: Family Medicine

## 2018-08-22 DIAGNOSIS — F331 Major depressive disorder, recurrent, moderate: Secondary | ICD-10-CM

## 2018-08-24 ENCOUNTER — Other Ambulatory Visit: Payer: Self-pay | Admitting: Adult Health

## 2018-08-30 ENCOUNTER — Other Ambulatory Visit: Payer: Self-pay

## 2018-08-30 ENCOUNTER — Ambulatory Visit: Payer: Medicaid Other | Admitting: Family Medicine

## 2018-08-30 VITALS — BP 118/60 | HR 94 | Temp 98.5°F | Resp 16 | Ht 64.0 in | Wt 170.2 lb

## 2018-08-30 DIAGNOSIS — Z794 Long term (current) use of insulin: Secondary | ICD-10-CM | POA: Diagnosis not present

## 2018-08-30 DIAGNOSIS — L03119 Cellulitis of unspecified part of limb: Secondary | ICD-10-CM | POA: Diagnosis not present

## 2018-08-30 DIAGNOSIS — E039 Hypothyroidism, unspecified: Secondary | ICD-10-CM | POA: Diagnosis not present

## 2018-08-30 DIAGNOSIS — L02419 Cutaneous abscess of limb, unspecified: Secondary | ICD-10-CM

## 2018-08-30 DIAGNOSIS — N183 Chronic kidney disease, stage 3 unspecified: Secondary | ICD-10-CM

## 2018-08-30 DIAGNOSIS — I10 Essential (primary) hypertension: Secondary | ICD-10-CM | POA: Diagnosis not present

## 2018-08-30 DIAGNOSIS — E1122 Type 2 diabetes mellitus with diabetic chronic kidney disease: Secondary | ICD-10-CM | POA: Diagnosis not present

## 2018-08-30 MED ORDER — CLINDAMYCIN HCL 300 MG PO CAPS: 300.0000 mg | ORAL_CAPSULE | Freq: Three times a day (TID) | ORAL | 0 refills | Status: DC

## 2018-08-30 MED ORDER — FLUCONAZOLE 150 MG PO TABS: ORAL_TABLET | ORAL | 0 refills | Status: DC

## 2018-08-30 NOTE — Progress Notes (Signed)
Subjective:    Patient ID: Kathryn Oconnell, female    DOB: [birth year], 53 y.o.   MRN: [id number]  Patient presents for Edema (R shoulder, started 08/29/2018, insulin fell out and pt was unaware and pus came out )  Thursday AM noted swelling, Blood sugar started going up to  500's she noted some discomfort and redness of her right upper arm with omnipod sensor is located machine was half way out and pus was coming out. No fever no chills  CBG today 160's  She actually gave her self both Lantus and Tresiba Thursday?? Did not want to go to ER   Changes sensor every 3 days has not had any problems before, insulin injector is in her abdomen    Butrans patch 21mcg/hour on left upper arm  Note has not called endocrine yet   Some confusion about her last visit with endocrine per report, has not had any labs since this year   Review Of Systems:  GEN- denies fatigue, fever, weight loss,weakness, recent illness HEENT- denies eye drainage, change in vision, nasal discharge, CVS- denies chest pain, palpitations RESP- denies SOB, cough, wheeze ABD- denies N/V, change in stools, abd pain GU- denies dysuria, hematuria, dribbling, incontinence MSK- denies joint pain, muscle aches, injury Neuro- denies headache, dizziness, syncope, seizure activity       Objective:    BP 118/60   Pulse 94   Temp 98.5 F (36.9 C)   Resp 16   Ht 5\' 4"  (1.626 m)   Wt 170 lb 3.2 oz (77.2 kg)   LMP 06/18/2013 Comment: irregular  SpO2 94%   BMI 29.21 kg/m  GEN- NAD, alert and oriented x3 HEENT- PERRL, EOMI, non injected sclera, pink conjunctiva, MMM, oropharynx clear, erythema swelling right upper gumline Neck- Supple, no LAD CVS- RRR, no murmur RESP-CTAB EXT- No edema MSK- FROM Right upper ext, Joint NT Pulses- Radial, 2+ Skin- Right Upper arm -erythema upper arm, small indurated areas with scab in center of the erythema, +warmth, no fluctance, mild TTP     Assessment & Plan:      Problem List Items  Addressed This Visit      Unprioritized   Diabetes mellitus ([place])    Has been uncontrolled, will obtain labs, will also get the fructosamine lab per endocrine and forward results She is still able to use the auto-injector on her abdomen, but will check CBG via meter instead of the sensor  Due to cellulitis       Relevant Orders   Hemoglobin A1c (Completed)   Lipid panel (Completed)   Fructosamine   Essential hypertension, benign - Primary    Blood pressure looks good no changes       Relevant Orders   CBC with Differential/Platelet (Completed)   Comprehensive metabolic panel (Completed)   Hypothyroidism   Relevant Orders   TSH (Completed)   T3, free (Completed)   T4, free (Completed)    Other Visit Diagnoses    Cellulitis and abscess of upper extremity       Pus already expressed, no area to I&D today. but still has cellultis, remove sensor She also stated she had bad tooth, has not seen dentist Given clindamycin to hopefully cover both  Diflucan due to yeast infection after antibiotics  Discussed red flags, f/u Monday via phone, pt is nurse advised to outline the erythema if it spreads beyond the lines go to ER for IV antibiotics       Note: This dictation was  prepared with Dragon dictation along with smaller phrase technology. Any transcriptional errors that result from this process are unintentional.

## 2018-08-31 LAB — COMPREHENSIVE METABOLIC PANEL
AG Ratio: 1.3 (calc) (ref 1.0–2.5)
ALT: 17 U/L (ref 6–29)
AST: 24 U/L (ref 10–35)
Albumin: 3.9 g/dL (ref 3.6–5.1)
Alkaline phosphatase (APISO): 93 U/L (ref 37–153)
BUN/Creatinine Ratio: 13 (calc) (ref 6–22)
BUN: 19 mg/dL (ref 7–25)
CO2: 20 mmol/L (ref 20–32)
Calcium: 9.5 mg/dL (ref 8.6–10.4)
Chloride: 107 mmol/L (ref 98–110)
Creat: 1.46 mg/dL — ABNORMAL HIGH (ref 0.50–1.05)
Globulin: 2.9 g/dL (calc) (ref 1.9–3.7)
Glucose, Bld: 116 mg/dL — ABNORMAL HIGH (ref 65–99)
Potassium: 4.7 mmol/L (ref 3.5–5.3)
Sodium: 136 mmol/L (ref 135–146)
Total Bilirubin: 0.5 mg/dL (ref 0.2–1.2)
Total Protein: 6.8 g/dL (ref 6.1–8.1)

## 2018-08-31 LAB — LIPID PANEL
Cholesterol: 155 mg/dL (ref <200)
HDL: 42 mg/dL — ABNORMAL LOW (ref >=50)
LDL Cholesterol (Calc): 94 mg/dL (calc)
Non-HDL Cholesterol (Calc): 113 mg/dL (calc) (ref <130)
Total CHOL/HDL Ratio: 3.7 (calc) (ref <5.0)
Triglycerides: 96 mg/dL (ref <150)

## 2018-08-31 LAB — HEMOGLOBIN A1C
Hgb A1c MFr Bld: 9.1 % of total Hgb — ABNORMAL HIGH (ref <5.7)
Mean Plasma Glucose: 214 (calc)
eAG (mmol/L): 11.9 (calc)

## 2018-08-31 LAB — T3, FREE: T3, Free: 1.9 pg/mL — ABNORMAL LOW (ref 2.3–4.2)

## 2018-08-31 LAB — CBC WITH DIFFERENTIAL/PLATELET
Absolute Monocytes: 935 cells/uL (ref 200–950)
Basophils Absolute: 53 cells/uL (ref 0–200)
Basophils Relative: 0.5 %
Eosinophils Absolute: 189 cells/uL (ref 15–500)
Eosinophils Relative: 1.8 %
HCT: 40.7 % (ref 35.0–45.0)
Hemoglobin: 14 g/dL (ref 11.7–15.5)
Lymphs Abs: 2678 cells/uL (ref 850–3900)
MCH: 33.3 pg — ABNORMAL HIGH (ref 27.0–33.0)
MCHC: 34.4 g/dL (ref 32.0–36.0)
MCV: 96.9 fL (ref 80.0–100.0)
MPV: 11.2 fL (ref 7.5–12.5)
Monocytes Relative: 8.9 %
Neutro Abs: 6647 cells/uL (ref 1500–7800)
Neutrophils Relative %: 63.3 %
Platelets: 220 10*3/uL (ref 140–400)
RBC: 4.2 10*6/uL (ref 3.80–5.10)
RDW: 14 % (ref 11.0–15.0)
Total Lymphocyte: 25.5 %
WBC: 10.5 10*3/uL (ref 3.8–10.8)

## 2018-08-31 LAB — TSH: TSH: 0.15 mIU/L — ABNORMAL LOW

## 2018-08-31 LAB — T4, FREE: Free T4: 1.1 ng/dL (ref 0.8–1.8)

## 2018-09-01 ENCOUNTER — Encounter: Payer: Self-pay | Admitting: Family Medicine

## 2018-09-01 NOTE — Assessment & Plan Note (Signed)
Blood pressure looks good no changes  

## 2018-09-01 NOTE — Assessment & Plan Note (Signed)
Has been uncontrolled, will obtain labs, will also get the fructosamine lab per endocrine and forward results She is still able to use the auto-injector on her abdomen, but will check CBG via meter instead of the sensor  Due to cellulitis

## 2018-09-04 ENCOUNTER — Other Ambulatory Visit: Payer: Self-pay | Admitting: Family Medicine

## 2018-09-04 DIAGNOSIS — E039 Hypothyroidism, unspecified: Secondary | ICD-10-CM

## 2018-09-04 DIAGNOSIS — M545 Low back pain: Secondary | ICD-10-CM | POA: Diagnosis not present

## 2018-09-04 DIAGNOSIS — Z79899 Other long term (current) drug therapy: Secondary | ICD-10-CM

## 2018-09-04 DIAGNOSIS — Z79891 Long term (current) use of opiate analgesic: Secondary | ICD-10-CM | POA: Diagnosis not present

## 2018-09-04 DIAGNOSIS — E1142 Type 2 diabetes mellitus with diabetic polyneuropathy: Secondary | ICD-10-CM | POA: Diagnosis not present

## 2018-09-04 LAB — FRUCTOSAMINE: Fructosamine: 374 umol/L — ABNORMAL HIGH (ref 205–285)

## 2018-09-04 MED ORDER — LEVOTHYROXINE SODIUM 88 MCG PO TABS: 88.0000 ug | ORAL_TABLET | Freq: Every day | ORAL | 3 refills | Status: DC

## 2018-09-26 ENCOUNTER — Telehealth: Payer: Self-pay | Admitting: *Deleted

## 2018-09-26 NOTE — Telephone Encounter (Signed)
Received call from patient. [phone]~ telephone.   Repots that she has increased amounts of back pain. Reports that she had X-ray done, but did not show any acute issues. Reports that her pain has worsened since having the imaging.   Advised that no recent imaging was ordered from [facility]. No reports noted in chart. Advised to contact provider who obtained imaging, whether that is pain clinic or ortho, to discuss increased back pain.   Of note, MD made aware and states that she will not be giving pain medication for issues at this time

## 2018-09-27 ENCOUNTER — Other Ambulatory Visit: Payer: Self-pay

## 2018-09-27 ENCOUNTER — Encounter: Payer: Self-pay | Admitting: Endocrinology

## 2018-09-27 ENCOUNTER — Ambulatory Visit (INDEPENDENT_AMBULATORY_CARE_PROVIDER_SITE_OTHER): Payer: Medicaid Other | Admitting: Endocrinology

## 2018-09-27 ENCOUNTER — Other Ambulatory Visit: Payer: Self-pay | Admitting: Family Medicine

## 2018-09-27 VITALS — BP 138/82 | HR 88 | Temp 97.3°F | Ht 64.0 in | Wt 174.8 lb

## 2018-09-27 DIAGNOSIS — Z794 Long term (current) use of insulin: Secondary | ICD-10-CM

## 2018-09-27 DIAGNOSIS — E1165 Type 2 diabetes mellitus with hyperglycemia: Secondary | ICD-10-CM

## 2018-09-27 NOTE — Patient Instructions (Signed)
Bolus time

## 2018-09-27 NOTE — Progress Notes (Signed)
Patient ID: Kathryn Oconnell, female   DOB: Jan 27, 1966, 53 y.o.   MRN: [id number]           Reason for Appointment: Follow-up for Type 2 Diabetes and related problems  Referring physician: Dr. Buelah Manis   History of Present Illness:          Date of diagnosis of type 2 diabetes mellitus: 2000        Background history:   She had been on metformin for a few years but this was stopped in 1/16 when her renal function was impaired. She says she had been doing fairly well with this Trulicity 8.50 mg weekly In 10/16 she had significant hyperglycemia while taking oral hypoglycemic drugs and she was started on insulin She was switched back to oral agents with glipizide and Onglyza in 1/17 However subsequently blood sugars started going up and she was back on insulin in April 2017  Recent history:   INSULIN regimen:  OMNI pod insulin pump with basal rates: Midnight-6 AM = 0. 55, 7 AM-5 PM = 0. 65 and 5 PM = 0. 75 Carbohydrate coverage 1: 10 with target 120-140 and correction 1: 50  Non-insulin hypoglycemic drugs the patient is taking are: Ozempic 0.5 mg weekly starting 03/21/17  A1c is much higher at 9.1 compared to 7.7  Current management, blood sugar patterns and problems identified:  She is now coming back after 3 months, in March was started on the Omni pod pump  She has had an issue with infection at the site of her insulin pod in her upper arm about 4 weeks ago treated by PCP  However she thinks she is continuing to use the pump subsequently and recently using her abdomen  However her pump download does not show any active data about her pump use although history reveals that she did bolus 5 units this morning at 10 AM  Also has not been wearing her freestyle libre consistently and has only got 50% of data  Blood sugar patterns are inconsistent over the last 2 weeks  HYPERGLYCEMIA occurs inconsistently at various times including midday and afternoon, late evening and at least the  last 3 nights during the night also  Not clear why the last 4 days her blood sugars have been fairly consistently high even though she thinks she has been using her pump as directed but unable to verify her boluses  Not clear if some of her high readings may be related to missed boluses  Yesterday because of persistently high blood sugars around 400 she took 25 units of Lantus and 30 units of regular insulin with only transient improvement in her blood sugar  Today with 5 units of bolus this morning for about 30 g carbohydrate her blood sugar is now 178 this afternoon  She does not understand that she needs to bolus based on both the carbohydrate and the actual blood sugar whenever she is having a meal or snack and also not clear if she is bolusing consistently when her blood sugars are high  She does however appear to be checking her sugars 3+ times a day   HYPOGLYCEMIA none recently but she had low blood sugars in the morning hours prior to 6/16 and once around midnight with a couple of days she was low overnight       HYPOGLYCEMIA symptoms: May feel shaky when symptomatic, does not always have symptoms with low sugar readings  Glucose monitoring:        Glucometer:  Libre  Data for the last 2 weeks:  CGM use % of time  51  2-week average/SD  198, GV 58%  Time in range       47 %  % Time Above 180  15  % Time above 250  29  % Time Below 70  9      Self-care: The diet that the patient has been following is: Reducing portions Meal times are:  Breakfast is variable, lunch: 2 Dinner: 7 PM   Typical meal intake: Breakfast usually not cereal, rarely eating fast food biscuits.  Evening meal is meat or chicken with potatoes, greens, macaroni.  Snacks on chips.  She will drink water and some juice, usually diet drinks                Dietician visit, most recent: 6/17               Exercise:  None, was previously walking 3/7 days, about 30 minutes in the evening  Weight history:  Previous range 119-196  Wt Readings from Last 3 Encounters:  08/30/18 170 lb 3.2 oz (77.2 kg)  05/31/18 172 lb 12.8 oz (78.4 kg)  05/20/18 171 lb (77.6 kg)    Glycemic control:   Lab Results  Component Value Date   HGBA1C 9.1 (H) 08/30/2018   HGBA1C 7.7 (H) 03/25/2018   HGBA1C 8.7 (A) 09/27/2017   Lab Results  Component Value Date   MICROALBUR 0.4 03/25/2018   LDLCALC 94 08/30/2018   CREATININE 1.46 (H) 08/30/2018   Lab Results  Component Value Date   MICRALBCREAT 10 03/25/2018    Other active problems discussed today: See review of systems   Allergies as of 09/27/2018      Reactions   Erythromycin Other (See Comments)   Stomach cramps   Imitrex [sumatriptan] Other (See Comments)   Migraine and increased BP   Sulfa Antibiotics Other (See Comments)   Causes stomach cramps   Topamax [topiramate] Nausea And Vomiting   Nausea and worsening      Medication List       Accurate as of September 27, 2018  4:02 PM. If you have any questions, ask your nurse or doctor.        amLODipine 10 MG tablet Commonly known as: NORVASC Take 0.5 tablets (5 mg total) by mouth daily. What changed: additional instructions   aspirin 81 MG tablet Take 81 mg by mouth daily.   atorvastatin 40 MG tablet Commonly known as: LIPITOR Take 1 tablet (40 mg total) by mouth daily.   Azelastine-Fluticasone 137-50 MCG/ACT Susp Place into the nose.   buprenorphine 20 MCG/HR Ptwk patch Commonly known as: [place] onto the skin once a week.   chlorhexidine 0.12 % solution Commonly known as: PERIDEX USE AS DIRECTED 15 MLS IN THE MOUTH OR THROAT TWICE DAILY   clindamycin 300 MG capsule Commonly known as: Cleocin Take 1 capsule (300 mg total) by mouth 3 (three) times daily.   cyclobenzaprine 10 MG tablet Commonly known as: FLEXERIL Take 1 tablet by mouth three times daily as needed   cyclobenzaprine 10 MG tablet Commonly known as: FLEXERIL Take 1 tablet (10 mg total) by mouth 3  (three) times daily as needed.   fluconazole 150 MG tablet Commonly known as: DIFLUCAN Take 1 tablet repeat in 3 days   FreeStyle Libre 14 Day Sensor Misc 1 each by Other route every 14 (fourteen) days. Apply 1 sensor to body once every 14 days to  monitor blood sugar.   glucose blood test strip Use Contour Next test strips as instructed to check blood sugar 4 times daily.   insulin aspart 100 UNIT/ML injection Commonly known as: novoLOG Use max of 50 units daily via insulin pump.   Insulin Pen Needle 32G X 6 MM Misc Commonly known as: BD Pen Needle Micro U/F Use as directed to inject insulin SQ.   Lantus SoloStar 100 UNIT/ML Solostar Pen Generic drug: Insulin Glargine Inject 30 Units into the skin daily. INJECT 30 UNITS UNDER THE SKIN EVERY MORNING.   levothyroxine 88 MCG tablet Commonly known as: SYNTHROID Take 1 tablet (88 mcg total) by mouth daily.   LORazepam 1 MG tablet Commonly known as: ATIVAN TAKE 1 TABLET BY MOUTH THREE TIMES DAILY AS NEEDED FOR ANXIETY   medroxyPROGESTERone 2.5 MG tablet Commonly known as: PROVERA Take 1 tablet by mouth once daily   metoprolol tartrate 25 MG tablet Commonly known as: LOPRESSOR TAKE 1 TABLET BY MOUTH TWICE DAILY   OmniPod Dash 5 Pack Pods Misc 1 each by Does not apply route every 3 (three) days. APPLY 1 OMNIPOD TO BODY ONCE EVERY 3 DAYS FOR INSULIN DELIVERY.   pregabalin 75 MG capsule Commonly known as: Lyrica Take 1 capsule (75 mg total) by mouth 2 (two) times daily.   Semaglutide(0.25 or 0.5MG /DOS) 2 MG/1.5ML Sopn Commonly known as: Ozempic (0.25 or 0.5 MG/DOSE) Inject 0.5 mg into the skin once a week.   sertraline 100 MG tablet Commonly known as: ZOLOFT Take 1 tablet by mouth once daily   valACYclovir 1000 MG tablet Commonly known as: Valtrex Take 1 tablet (1,000 mg total) by mouth daily as needed.   zolpidem 12.5 MG CR tablet Commonly known as: AMBIEN CR Take 1 tablet (12.5 mg total) by mouth at bedtime as  needed.       Allergies:  Allergies  Allergen Reactions  . Erythromycin Other (See Comments)    Stomach cramps  . Imitrex [Sumatriptan] Other (See Comments)    Migraine and increased BP  . Sulfa Antibiotics Other (See Comments)    Causes stomach cramps  . Topamax [Topiramate] Nausea And Vomiting    Nausea and worsening    Past Medical History:  Diagnosis Date  . Anxiety   . Chronic kidney disease   . COPD (chronic obstructive pulmonary disease) ([place])   . Depression   . Diabetes mellitus without complication ([place])   . Eczema   . Graves disease   . HSV-2 infection   . Hyperlipidemia   . Hypertension   . Hypothyroid   . Lung nodule   . Menopausal vaginal dryness 02/24/2014  . Migraines   . Neuropathy   . Tobacco use   . Trouble in sleeping 06/25/2015    Past Surgical History:  Procedure Laterality Date  . CESAREAN SECTION     x 2  . FOOT SURGERY     5th digit left foot / Great toe, 2ND TOE  . THYROID SURGERY      Family History  Problem Relation Age of Onset  . Stroke Mother        x 3  . Cancer Mother        breast   . Thyroid disease Mother   . Hyperlipidemia Mother   . Cancer Father        stomach cancer   . Hypertension Father   . Heart attack Father 19  . Gout Brother   . Migraines Son   . Hypertension  Paternal Uncle   . Heart disease Maternal Grandmother   . Heart attack Maternal Grandmother   . Heart disease Maternal Grandfather   . Heart attack Maternal Grandfather   . Cancer Paternal Grandmother   . Diabetes Paternal Grandfather   . Asthma Daughter     Social History:  reports that she has been smoking cigarettes. She has a 17.50 pack-year smoking history. She has never used smokeless tobacco. She reports that she does not drink alcohol or use drugs.   Review of Systems      NEUROPATHY: She has been on Lyrica 75mg   for symptomatic relief   However even with taking oxycodone she has inadequate relief and also other musculoskeletal  pains She says she did not benefit from gabapentin and Cymbalta previously  Lipid history:  on Lipitor 40 mg prescribed by PCP, last LDL below 100    Lab Results  Component Value Date   CHOL 155 08/30/2018   HDL 42 (L) 08/30/2018   LDLCALC 94 08/30/2018   LDLDIRECT 95 10/04/2015   TRIG 96 08/30/2018   CHOLHDL 3.7 08/30/2018           Hypertension: mild, on metoprolol 12.5 mg twice a day along with 10 mg amlodipine She has been followed by PCP    BP Readings from Last 3 Encounters:  08/30/18 118/60  05/31/18 (!) 142/86  05/20/18 122/68    Most recent eye exam was In 05/2016 Last foot exam 9/18   HYPOTHYROIDISM:  She is on long-term supplementation with levothyroxine after surgery The dose was increased from 88 up to 100 g previously  Followed by PCP now  TSH levels as below:  Lab Results  Component Value Date   TSH 0.15 (L) 08/30/2018   TSH 0.71 03/25/2018   TSH 0.63 06/27/2017   FREET4 1.1 08/30/2018   FREET4 1.1 03/25/2018   FREET4 1.1 06/27/2017   Depression: She is on 100 mg of Zoloft     Physical Examination:  LMP 06/18/2013 Comment: irregular     ASSESSMENT:  Diabetes type 2, insulin requiring  See history of present illness for detailed discussion of current diabetes management, blood sugar patterns and problems identified  She is on a regimen of basal bolus insulin now with the insulin pump and Ozempic, 0.5 mg weekly  Her last A1c was 7.7 and is now 9.1  When she started the pump her blood sugars were excellent However now they are extremely labile and difficult to know whether she is managing her pump well or not Because a lack of data from her pump today not clear how she is managing her boluses, changing her pump on time Also not clear whether she is bolusing consistently when she needs to, unlikely that she is bolusing based on both high sugars and actual carbohydrate intake She previously also has a history of using regular soft drinks  causing hyperglycemia  Although she does have a tendency to hypoglycemia between 7-8 AM this is not present recently   PLAN:    Currently unable to know how to manage her pump settings however will likely need a higher basal rates during the day and evening Discussed in detail how to do boluses for meals and high sugars reminded to make sure she is bolusing for all her high sugars regardless of whether she is eating or not She does need to be instructed on how to upload her pump at home Basal rate at midnight will be increased slightly to 0.6, 7  AM = 0.8 and 5 PM = 0.85 To avoid overcorrection she will use a sensitivity of 1: 60 She will enter both blood sugars and actual carbohydrates eaten whenever she needs to bolus To review management next week along with visit to the nurse educator She should also be reviewing procedure for applying her insulin pods and making sure she is using proper skin preparation  There are no Patient Instructions on file for this visit.        Kathryn Oconnell 09/27/2018, 4:02 PM   Note: This office note was prepared with Dragon voice recognition system technology. Any transcriptional errors that result from this process are unintentional.

## 2018-09-30 ENCOUNTER — Other Ambulatory Visit: Payer: Self-pay | Admitting: Family Medicine

## 2018-09-30 NOTE — Telephone Encounter (Signed)
Requested Prescriptions   Pending Prescriptions Disp Refills  . LORazepam (ATIVAN) 1 MG tablet [Pharmacy Med Name: LORazepam 1 MG Oral Tablet] 90 tablet 0    Sig: TAKE 1 TABLET BY MOUTH THREE TIMES DAILY AS NEEDED FOR ANXIETY   Last OV 09/04/2018 Last written 05/22/2018

## 2018-09-30 NOTE — Telephone Encounter (Signed)
Requesting refill    Ativan  LOV: 08/30/18  LRF:  05/22/18

## 2018-10-01 ENCOUNTER — Encounter: Payer: Medicaid Other | Admitting: Nutrition

## 2018-10-01 ENCOUNTER — Ambulatory Visit: Payer: Medicaid Other | Admitting: Endocrinology

## 2018-10-02 ENCOUNTER — Encounter: Payer: Medicaid Other | Attending: Endocrinology | Admitting: Nutrition

## 2018-10-02 ENCOUNTER — Other Ambulatory Visit: Payer: Self-pay

## 2018-10-02 ENCOUNTER — Ambulatory Visit (INDEPENDENT_AMBULATORY_CARE_PROVIDER_SITE_OTHER): Payer: Medicaid Other | Admitting: Endocrinology

## 2018-10-02 ENCOUNTER — Encounter: Payer: Self-pay | Admitting: Family Medicine

## 2018-10-02 ENCOUNTER — Encounter: Payer: Self-pay | Admitting: Endocrinology

## 2018-10-02 VITALS — BP 122/70 | HR 92 | Ht 64.0 in | Wt 174.6 lb

## 2018-10-02 DIAGNOSIS — Z794 Long term (current) use of insulin: Secondary | ICD-10-CM | POA: Diagnosis not present

## 2018-10-02 DIAGNOSIS — E1165 Type 2 diabetes mellitus with hyperglycemia: Secondary | ICD-10-CM | POA: Diagnosis not present

## 2018-10-02 NOTE — Progress Notes (Signed)
Patient ID: Kathryn Oconnell, female   DOB: Jan 05, 1966, 53 y.o.   MRN: [id number]           Reason for Appointment: Follow-up for Type 2 Diabetes and related problems  Referring physician: Dr. Buelah Manis   History of Present Illness:          Date of diagnosis of type 2 diabetes mellitus: 2000        Background history:   She had been on metformin for a few years but this was stopped in 1/16 when her renal function was impaired. She says she had been doing fairly well with this Trulicity 5.39 mg weekly In 10/16 she had significant hyperglycemia while taking oral hypoglycemic drugs and she was started on insulin She was switched back to oral agents with glipizide and Onglyza in 1/17 However subsequently blood sugars started going up and she was back on insulin in April 2017  Recent history:   INSULIN regimen:  OMNI pod insulin pump with basal rates: Midnight-6 AM = 0. 55, 5 PM = 1.0 Carbohydrate coverage 1: 10 with target 120-140 and correction 1: 50  Non-insulin hypoglycemic drugs the patient is taking are: Ozempic 0.5 mg weekly starting 03/21/17  A1c is recently much higher at 9.1 compared to 7.7  Current management, blood sugar patterns and problems identified:  She is today coming back for follow-up visit since her insulin pump data was not available last week and she had marked variability in her blood sugars showing no patterns  She also was seen by the nurse educator to troubleshoot her day-to-day management and review her data also  She was told to change her basal rates from her last visit but not clear why she did not do so  Problems identified:  She has marked variability in her blood sugars from day-to-day which continues including overnight  From a freestyle libre sensor it appears that she has had 3 significant episodes of hyperglycemia as follows:  On Sunday she had amount of juice in the morning and did not bolus for this  On Sunday evening she had dessert with  brownies and did not take adequate bolus at dinnertime  On Monday evening she forgot her bolus at dinnertime  On Tuesday night during the night she had some fruit and juice but even with taking her bolus for 45 g of carbohydrate her blood sugar was low normal  Currently FASTING blood sugars appear to be improving and mostly within the target range with variability  She is not usually entering her blood sugars in the pump when she needs a correction dose and may forget to do the correction  Occasionally she may not check her sugar during the day and evening  She has had episodes of back pain and not clear if she has high readings from this        HYPOGLYCEMIA symptoms: May feel shaky when symptomatic, does not always have symptoms with low sugar readings  Glucose monitoring:        Glucometer:  Elenor Legato  Data for the last 2 weeks: Average 217 with 61% active time 44% within target range   Self-care: The diet that the patient has been following is: Reducing portions Meal times are:  Breakfast is variable, lunch: 2 Dinner: 7 PM   Typical meal intake: Breakfast usually not cereal, rarely eating fast food biscuits.  Evening meal is meat or chicken with potatoes, greens, macaroni.  Snacks on chips.  She will drink water and some  juice, usually diet drinks                Dietician visit, most recent: 6/17               Exercise:  None because of back pain, was previously walking 3/7 days, about 30 minutes in the evening  Weight history: Previous range 119-196  Wt Readings from Last 3 Encounters:  10/02/18 174 lb 9.6 oz (79.2 kg)  09/27/18 174 lb 12.8 oz (79.3 kg)  08/30/18 170 lb 3.2 oz (77.2 kg)    Glycemic control:   Lab Results  Component Value Date   HGBA1C 9.1 (H) 08/30/2018   HGBA1C 7.7 (H) 03/25/2018   HGBA1C 8.7 (A) 09/27/2017   Lab Results  Component Value Date   MICROALBUR 0.4 03/25/2018   LDLCALC 94 08/30/2018   CREATININE 1.46 (H) 08/30/2018   Lab Results   Component Value Date   MICRALBCREAT 10 03/25/2018    Other active problems discussed today: See review of systems   Allergies as of 10/02/2018      Reactions   Erythromycin Other (See Comments)   Stomach cramps   Imitrex [sumatriptan] Other (See Comments)   Migraine and increased BP   Sulfa Antibiotics Other (See Comments)   Causes stomach cramps   Topamax [topiramate] Nausea And Vomiting   Nausea and worsening      Medication List       Accurate as of October 02, 2018  3:04 PM. If you have any questions, ask your nurse or doctor.        STOP taking these medications   Lantus SoloStar 100 UNIT/ML Solostar Pen Generic drug: Insulin Glargine Stopped by: Elayne Snare, MD     TAKE these medications   amLODipine 10 MG tablet Commonly known as: NORVASC Take 0.5 tablets (5 mg total) by mouth daily. What changed: additional instructions   aspirin 81 MG tablet Take 81 mg by mouth daily.   atorvastatin 40 MG tablet Commonly known as: LIPITOR Take 1 tablet (40 mg total) by mouth daily.   Azelastine-Fluticasone 137-50 MCG/ACT Susp Place into the nose.   Belbuca 600 MCG Film Generic drug: Buprenorphine HCl DISSOLVE 1 STRIP BY MOUTH TWICE DAILY   buprenorphine 20 MCG/HR Ptwk patch Commonly known as: BUTRANS Place onto the skin once a week.   chlorhexidine 0.12 % solution Commonly known as: PERIDEX USE AS DIRECTED 15 MLS IN THE MOUTH OR THROAT TWICE DAILY   cyclobenzaprine 10 MG tablet Commonly known as: FLEXERIL Take 1 tablet (10 mg total) by mouth 3 (three) times daily as needed.   FreeStyle Libre 14 Day Sensor Misc 1 each by Other route every 14 (fourteen) days. Apply 1 sensor to body once every 14 days to monitor blood sugar.   glucose blood test strip Use Contour Next test strips as instructed to check blood sugar 4 times daily.   insulin aspart 100 UNIT/ML injection Commonly known as: novoLOG Use max of 50 units daily via insulin pump.   Insulin Pen Needle  32G X 6 MM Misc Commonly known as: BD Pen Needle Micro U/F Use as directed to inject insulin SQ.   levothyroxine 88 MCG tablet Commonly known as: SYNTHROID Take 1 tablet (88 mcg total) by mouth daily.   LORazepam 1 MG tablet Commonly known as: ATIVAN TAKE 1 TABLET BY MOUTH THREE TIMES DAILY AS NEEDED FOR ANXIETY   medroxyPROGESTERone 2.5 MG tablet Commonly known as: PROVERA Take 1 tablet by mouth once daily  metoprolol tartrate 25 MG tablet Commonly known as: LOPRESSOR TAKE 1 TABLET BY MOUTH TWICE DAILY   OmniPod Dash 5 Pack Pods Misc 1 each by Does not apply route every 3 (three) days. APPLY 1 OMNIPOD TO BODY ONCE EVERY 3 DAYS FOR INSULIN DELIVERY.   oxyCODONE-acetaminophen 10-325 MG tablet Commonly known as: PERCOCET Take 1 tablet by mouth 2 (two) times daily.   pregabalin 75 MG capsule Commonly known as: Lyrica Take 1 capsule (75 mg total) by mouth 2 (two) times daily.   Semaglutide(0.25 or 0.5MG /DOS) 2 MG/1.5ML Sopn Commonly known as: Ozempic (0.25 or 0.5 MG/DOSE) Inject 0.5 mg into the skin once a week.   sertraline 100 MG tablet Commonly known as: ZOLOFT Take 1 tablet by mouth once daily   valACYclovir 1000 MG tablet Commonly known as: Valtrex Take 1 tablet (1,000 mg total) by mouth daily as needed.   zolpidem 12.5 MG CR tablet Commonly known as: AMBIEN CR Take 1 tablet (12.5 mg total) by mouth at bedtime as needed.       Allergies:  Allergies  Allergen Reactions  . Erythromycin Other (See Comments)    Stomach cramps  . Imitrex [Sumatriptan] Other (See Comments)    Migraine and increased BP  . Sulfa Antibiotics Other (See Comments)    Causes stomach cramps  . Topamax [Topiramate] Nausea And Vomiting    Nausea and worsening    Past Medical History:  Diagnosis Date  . Anxiety   . Chronic kidney disease   . COPD (chronic obstructive pulmonary disease) ([place])   . Depression   . Diabetes mellitus without complication ([place])   . Eczema   . Graves  disease   . HSV-2 infection   . Hyperlipidemia   . Hypertension   . Hypothyroid   . Lung nodule   . Menopausal vaginal dryness 02/24/2014  . Migraines   . Neuropathy   . Tobacco use   . Trouble in sleeping 06/25/2015    Past Surgical History:  Procedure Laterality Date  . CESAREAN SECTION     x 2  . FOOT SURGERY     5th digit left foot / Great toe, 2ND TOE  . THYROID SURGERY      Family History  Problem Relation Age of Onset  . Stroke Mother        x 3  . Cancer Mother        breast   . Thyroid disease Mother   . Hyperlipidemia Mother   . Cancer Father        stomach cancer   . Hypertension Father   . Heart attack Father 45  . Gout Brother   . Migraines Son   . Hypertension Paternal Uncle   . Heart disease Maternal Grandmother   . Heart attack Maternal Grandmother   . Heart disease Maternal Grandfather   . Heart attack Maternal Grandfather   . Cancer Paternal Grandmother   . Diabetes Paternal Grandfather   . Asthma Daughter     Social History:  reports that she has been smoking cigarettes. She has a 17.50 pack-year smoking history. She has never used smokeless tobacco. She reports that she does not drink alcohol or use drugs.   Review of Systems      NEUROPATHY: She has been on Lyrica 75mg   for symptomatic relief   However even with taking oxycodone she has inadequate relief and also other musculoskeletal pains She says she did not benefit from gabapentin and Cymbalta previously  Lipid history:  on Lipitor 40 mg prescribed by PCP, last LDL below 100    Lab Results  Component Value Date   CHOL 155 08/30/2018   HDL 42 (L) 08/30/2018   LDLCALC 94 08/30/2018   LDLDIRECT 95 10/04/2015   TRIG 96 08/30/2018   CHOLHDL 3.7 08/30/2018           Hypertension: mild, on metoprolol 12.5 mg twice a day along She has been followed by PCP    BP Readings from Last 3 Encounters:  10/02/18 122/70  09/27/18 138/82  08/30/18 118/60    Most recent eye exam was  In 05/2016 Last foot exam 9/18   HYPOTHYROIDISM:  She is on long-term supplementation with levothyroxine after surgery The dose was  from 88   TSH levels as below:  Lab Results  Component Value Date   TSH 0.15 (L) 08/30/2018   TSH 0.71 03/25/2018   TSH 0.63 06/27/2017   FREET4 1.1 08/30/2018   FREET4 1.1 03/25/2018   FREET4 1.1 06/27/2017   Depression: She is on 100 mg of Zoloft     Physical Examination:  BP 122/70 (BP Location: Left Arm, Patient Position: Sitting, Cuff Size: Normal)   Pulse 92   Ht 5\' 4"  (1.626 m)   Wt 174 lb 9.6 oz (79.2 kg)   LMP 06/18/2013 Comment: irregular  SpO2 96%   BMI 29.97 kg/m      ASSESSMENT:  Diabetes type 2, insulin requiring  See history of present illness for detailed discussion of current diabetes management, blood sugar patterns and problems identified  She is on a regimen of basal bolus insulin now with the insulin pump and Ozempic, 0.5 mg weekly  Her last A1c was 7.7 and is recently 9.1  Her problems were discussed above Day-to-day management was reviewed including reasons for hyperglycemia recently and how she is managing her boluses Also diet issues were discussed after review with the nurse educator also  Currently does not appear to have any consistent patterns of high or low sugars when she is not eating and basal rate likely does not need to be changed   PLAN:    She will keep the same basal rate Correction factor I: 60 at night Since she really does not appear to be knowing how to change her settings herself she was seen by the nurse educator and showed her how to do this Also discussion and recommendations above based on her problems were repeated by nurse educator She will try to stop drinking grape juice Restart walking when able to We will need to review management in 6 weeks  Reminded her of increased fluid intake especially when sugars are high as her last creatinine was higher  There are no Patient  Instructions on file for this visit.   Counseling time on subjects discussed in assessment and plan sections is over 50% of today's 25 minute visit      Elayne Snare 10/02/2018, 3:04 PM   Note: This office note was prepared with Dragon voice recognition system technology. Any transcriptional errors that result from this process are unintentional.

## 2018-10-08 DIAGNOSIS — Z79891 Long term (current) use of opiate analgesic: Secondary | ICD-10-CM | POA: Diagnosis not present

## 2018-10-08 DIAGNOSIS — E1142 Type 2 diabetes mellitus with diabetic polyneuropathy: Secondary | ICD-10-CM | POA: Diagnosis not present

## 2018-10-08 DIAGNOSIS — Z79899 Other long term (current) drug therapy: Secondary | ICD-10-CM | POA: Diagnosis not present

## 2018-10-08 DIAGNOSIS — M545 Low back pain: Secondary | ICD-10-CM | POA: Diagnosis not present

## 2018-10-08 DIAGNOSIS — G8929 Other chronic pain: Secondary | ICD-10-CM | POA: Diagnosis not present

## 2018-10-15 NOTE — Patient Instructions (Signed)
Bolus for all foods, drinks and snacks eaten Make sure to test blood sugar before all meals and at bedtime,  and add them to the bolus settings to correct for high readings.

## 2018-10-15 NOTE — Progress Notes (Signed)
We discussed the need to bolus for all meals/food eaten.  She reports doing this.  We also discussed the need to put blood sugars in at all boluses and at HS to correct for high readings.  Reviewed the use of the Freestyle meter and the need to test blood sugars with a finger stick when arrows are present.  Stressed also the need to clean area with alcohol before putting on pod.   Reviewed how to give a bolus and how to count carbs.  Patient reports that she has been doing this "most of the time".  She has not always been testing sugars at HS and doing a correction dose if needed.   Not sure why blood sugars having been going high, except for possible forgetting to bolus.  Reviewed high blood sugar protocol with her as well.  She had no final questions.

## 2018-11-04 ENCOUNTER — Other Ambulatory Visit: Payer: Self-pay | Admitting: Family Medicine

## 2018-11-05 ENCOUNTER — Telehealth: Payer: Self-pay | Admitting: Endocrinology

## 2018-11-05 NOTE — Telephone Encounter (Signed)
Pt is requesting refill on Lorazepam   LOV: 08/30/18  LRF:   09/30/18

## 2018-11-05 NOTE — Telephone Encounter (Signed)
Ok to refill??  Last office visit 08/30/2018.  Last refill 09/30/2018.

## 2018-11-05 NOTE — Telephone Encounter (Signed)
Patient states that her PCP took her out of work due to covid and Dr.Kumar agreed to write her a letter to send to unemployment but it was never done.  Please Advise, Thanks

## 2018-11-06 NOTE — Telephone Encounter (Signed)
Not clear if I need to write another letter since likely PCP already mentioned diabetes.  Also need to low work and a letter she needs

## 2018-11-06 NOTE — Telephone Encounter (Signed)
Called and left voicemail for pt to return call and clarify what she is needing.

## 2018-11-06 NOTE — Telephone Encounter (Signed)
Attempted to call pt and inform her that her PCP already provided a note. Do you also want to provide a note?

## 2018-11-07 DIAGNOSIS — M545 Low back pain: Secondary | ICD-10-CM | POA: Diagnosis not present

## 2018-11-07 DIAGNOSIS — Z79891 Long term (current) use of opiate analgesic: Secondary | ICD-10-CM | POA: Diagnosis not present

## 2018-11-07 DIAGNOSIS — Z79899 Other long term (current) drug therapy: Secondary | ICD-10-CM | POA: Diagnosis not present

## 2018-11-07 DIAGNOSIS — E1142 Type 2 diabetes mellitus with diabetic polyneuropathy: Secondary | ICD-10-CM | POA: Diagnosis not present

## 2018-11-07 DIAGNOSIS — E1165 Type 2 diabetes mellitus with hyperglycemia: Secondary | ICD-10-CM | POA: Diagnosis not present

## 2018-11-11 DIAGNOSIS — H2513 Age-related nuclear cataract, bilateral: Secondary | ICD-10-CM | POA: Diagnosis not present

## 2018-11-11 DIAGNOSIS — H5213 Myopia, bilateral: Secondary | ICD-10-CM | POA: Diagnosis not present

## 2018-11-11 DIAGNOSIS — H52223 Regular astigmatism, bilateral: Secondary | ICD-10-CM | POA: Diagnosis not present

## 2018-11-11 DIAGNOSIS — E119 Type 2 diabetes mellitus without complications: Secondary | ICD-10-CM | POA: Diagnosis not present

## 2018-11-11 DIAGNOSIS — H524 Presbyopia: Secondary | ICD-10-CM | POA: Diagnosis not present

## 2018-11-11 LAB — HM DIABETES EYE EXAM

## 2018-11-12 ENCOUNTER — Ambulatory Visit: Payer: Medicaid Other | Admitting: Endocrinology

## 2018-11-25 ENCOUNTER — Other Ambulatory Visit: Payer: Self-pay

## 2018-11-29 DIAGNOSIS — H5213 Myopia, bilateral: Secondary | ICD-10-CM | POA: Diagnosis not present

## 2018-11-29 DIAGNOSIS — H52223 Regular astigmatism, bilateral: Secondary | ICD-10-CM | POA: Diagnosis not present

## 2018-12-04 ENCOUNTER — Other Ambulatory Visit: Payer: Medicaid Other

## 2018-12-05 DIAGNOSIS — Z1159 Encounter for screening for other viral diseases: Secondary | ICD-10-CM | POA: Diagnosis not present

## 2018-12-05 DIAGNOSIS — Z79891 Long term (current) use of opiate analgesic: Secondary | ICD-10-CM | POA: Diagnosis not present

## 2018-12-05 DIAGNOSIS — Z79899 Other long term (current) drug therapy: Secondary | ICD-10-CM | POA: Diagnosis not present

## 2018-12-05 DIAGNOSIS — Z03818 Encounter for observation for suspected exposure to other biological agents ruled out: Secondary | ICD-10-CM | POA: Diagnosis not present

## 2018-12-05 DIAGNOSIS — R5383 Other fatigue: Secondary | ICD-10-CM | POA: Diagnosis not present

## 2018-12-06 ENCOUNTER — Other Ambulatory Visit: Payer: Self-pay

## 2018-12-11 ENCOUNTER — Ambulatory Visit: Payer: Medicaid Other | Admitting: Endocrinology

## 2018-12-12 ENCOUNTER — Encounter: Payer: Self-pay | Admitting: Endocrinology

## 2018-12-13 ENCOUNTER — Other Ambulatory Visit: Payer: Self-pay | Admitting: Family Medicine

## 2018-12-16 NOTE — Telephone Encounter (Signed)
Ok to refill??  Last office visit 08/30/2018.  Last refill 07/09/2018, #2 refills.

## 2018-12-23 ENCOUNTER — Telehealth: Payer: Self-pay | Admitting: Endocrinology

## 2018-12-23 NOTE — Telephone Encounter (Signed)
Patient dismissed from [facility] by Dr. Clydene Pugh, effective 12/12/2018. Dismissal letter was sent by registered mail. js

## 2018-12-30 ENCOUNTER — Other Ambulatory Visit: Payer: Self-pay | Admitting: Family Medicine

## 2018-12-30 ENCOUNTER — Other Ambulatory Visit: Payer: Self-pay | Admitting: Adult Health

## 2018-12-30 DIAGNOSIS — F331 Major depressive disorder, recurrent, moderate: Secondary | ICD-10-CM

## 2019-01-03 ENCOUNTER — Ambulatory Visit: Payer: Medicaid Other | Admitting: Family Medicine

## 2019-01-04 ENCOUNTER — Other Ambulatory Visit: Payer: Self-pay | Admitting: Endocrinology

## 2019-01-07 DIAGNOSIS — Z79899 Other long term (current) drug therapy: Secondary | ICD-10-CM | POA: Diagnosis not present

## 2019-01-07 DIAGNOSIS — Z79891 Long term (current) use of opiate analgesic: Secondary | ICD-10-CM | POA: Diagnosis not present

## 2019-01-07 DIAGNOSIS — E1142 Type 2 diabetes mellitus with diabetic polyneuropathy: Secondary | ICD-10-CM | POA: Diagnosis not present

## 2019-01-07 DIAGNOSIS — N183 Chronic kidney disease, stage 3 unspecified: Secondary | ICD-10-CM | POA: Diagnosis not present

## 2019-01-11 ENCOUNTER — Other Ambulatory Visit: Payer: Self-pay

## 2019-01-11 ENCOUNTER — Encounter: Payer: Self-pay | Admitting: Family Medicine

## 2019-01-13 ENCOUNTER — Other Ambulatory Visit: Payer: Self-pay | Admitting: Endocrinology

## 2019-01-13 ENCOUNTER — Encounter: Payer: Self-pay | Admitting: Family Medicine

## 2019-01-13 MED ORDER — LORAZEPAM 1 MG PO TABS: 1.0000 mg | ORAL_TABLET | Freq: Three times a day (TID) | ORAL | 1 refills | Status: DC | PRN

## 2019-01-13 MED ORDER — INSULIN ASPART 100 UNIT/ML ~~LOC~~ SOLN: SUBCUTANEOUS | 0 refills | Status: DC

## 2019-01-13 NOTE — Telephone Encounter (Signed)
Ok to refill??  Last office visit 10/02/2018.  Last refill 8/4/220, #1 refill.

## 2019-01-13 NOTE — Telephone Encounter (Signed)
Call and verify, if she still has her medicaid we can continue to get her labs done and check on her diabetes until she gets re-established Schedule OV

## 2019-01-13 NOTE — Telephone Encounter (Signed)
Review to determine if refill is appropriate

## 2019-01-13 NOTE — Telephone Encounter (Signed)
Call placed to patient and patient made aware.   Appointment scheduled.  

## 2019-01-14 ENCOUNTER — Other Ambulatory Visit: Payer: Self-pay

## 2019-01-14 MED ORDER — FREESTYLE LIBRE 14 DAY SENSOR MISC: 1.0000 | 3 refills | Status: DC

## 2019-01-23 ENCOUNTER — Encounter: Payer: Self-pay | Admitting: Family Medicine

## 2019-01-23 MED ORDER — METOPROLOL TARTRATE 25 MG PO TABS: 25.0000 mg | ORAL_TABLET | Freq: Two times a day (BID) | ORAL | 3 refills | Status: DC

## 2019-01-24 ENCOUNTER — Other Ambulatory Visit: Payer: Self-pay

## 2019-01-24 ENCOUNTER — Telehealth: Payer: Self-pay | Admitting: *Deleted

## 2019-01-24 ENCOUNTER — Ambulatory Visit (INDEPENDENT_AMBULATORY_CARE_PROVIDER_SITE_OTHER): Payer: Medicaid Other | Admitting: Family Medicine

## 2019-01-24 VITALS — BP 138/70 | HR 86 | Temp 97.9°F | Resp 16 | Ht 64.0 in | Wt 172.0 lb

## 2019-01-24 DIAGNOSIS — Z794 Long term (current) use of insulin: Secondary | ICD-10-CM | POA: Diagnosis not present

## 2019-01-24 DIAGNOSIS — D489 Neoplasm of uncertain behavior, unspecified: Secondary | ICD-10-CM

## 2019-01-24 DIAGNOSIS — Z23 Encounter for immunization: Secondary | ICD-10-CM | POA: Diagnosis not present

## 2019-01-24 DIAGNOSIS — J410 Simple chronic bronchitis: Secondary | ICD-10-CM

## 2019-01-24 DIAGNOSIS — E1121 Type 2 diabetes mellitus with diabetic nephropathy: Secondary | ICD-10-CM

## 2019-01-24 DIAGNOSIS — E782 Mixed hyperlipidemia: Secondary | ICD-10-CM | POA: Diagnosis not present

## 2019-01-24 DIAGNOSIS — B079 Viral wart, unspecified: Secondary | ICD-10-CM | POA: Diagnosis not present

## 2019-01-24 DIAGNOSIS — F331 Major depressive disorder, recurrent, moderate: Secondary | ICD-10-CM

## 2019-01-24 DIAGNOSIS — G894 Chronic pain syndrome: Secondary | ICD-10-CM

## 2019-01-24 DIAGNOSIS — G6289 Other specified polyneuropathies: Secondary | ICD-10-CM

## 2019-01-24 DIAGNOSIS — N1832 Chronic kidney disease, stage 3b: Secondary | ICD-10-CM

## 2019-01-24 DIAGNOSIS — E039 Hypothyroidism, unspecified: Secondary | ICD-10-CM

## 2019-01-24 DIAGNOSIS — F419 Anxiety disorder, unspecified: Secondary | ICD-10-CM

## 2019-01-24 DIAGNOSIS — E1122 Type 2 diabetes mellitus with diabetic chronic kidney disease: Secondary | ICD-10-CM

## 2019-01-24 DIAGNOSIS — Z720 Tobacco use: Secondary | ICD-10-CM

## 2019-01-24 DIAGNOSIS — I10 Essential (primary) hypertension: Secondary | ICD-10-CM | POA: Diagnosis not present

## 2019-01-24 MED ORDER — OMNIPOD DASH PODS (GEN 4) MISC: 1.0000 | 11 refills | Status: DC

## 2019-01-24 MED ORDER — OXYCODONE HCL 15 MG PO TABS: 15.0000 mg | ORAL_TABLET | ORAL | 0 refills | Status: DC | PRN

## 2019-01-24 NOTE — Telephone Encounter (Signed)
Received request from pharmacy for PA on Ambien CR  PA submitted via [place]. Confirmation # [id number]  Dx: F51.01- [id number].

## 2019-01-24 NOTE — Patient Instructions (Addendum)
Referral to therapy  Referral to endocrinology  Flu shot done We will call with lab results F/U 3 months

## 2019-01-24 NOTE — Progress Notes (Signed)
Subjective:    Patient ID: Kathryn Oconnell, female    DOB: December 05, 1965, 53 y.o.   MRN: [id number]  Patient presents for Follow-up (is fasting) and Mole (mole to R side that is falling off)  Here to follow-up medications.  Unfortunately because of multiple missed appointments she was dismissed by her endocrinologist. She is due for recheck on her diabetes and her hypothyroidism Diabetes  mellitus her last A1c was 9.1% she is currently on  Ozempic 0.5mg  weekly 36-50 units a day has pump , also omnipod    Hypothyroidism currently on 88 mcg of Synthroid  Hypertension she is taking blood pressure medication metoprolol 25mg  BID  as prescribed along with her statin drug  Chronic pain syndrome  She is followed by pain clinic   GAD/INSOMNIA-he continues on Zoloft as well as lorazepam as needed for anxiety and takes Ambien extended release for sleep She is under a lot of stress, son was in behavioral health recently, she is unemployed, she is helping with an uncle who needs to be placed, family is not helping very much, she states she didn't sleep at [place] last night  Mole- noticed change in the texture and more raised, it also rubs on her clothing on her right hip, has had for many years     Review Of Systems:  GEN- denies fatigue, fever, weight loss,weakness, recent illness HEENT- denies eye drainage, change in vision, nasal discharge, CVS- denies chest pain, palpitations RESP- denies SOB, cough, wheeze ABD- denies N/V, change in stools, abd pain GU- denies dysuria, hematuria, dribbling, incontinence MSK- +joint pain, muscle aches, injury Neuro- denies headache, dizziness, syncope, seizure activity       Objective:    BP 138/70   Pulse 86   Temp 97.9 F (36.6 C) (Oral)   Resp 16   Ht 5\' 4"  (1.626 m)   Wt 172 lb (78 kg)   LMP 06/18/2013 Comment: irregular  SpO2 98%   BMI 29.52 kg/m  GEN- NAD, alert and oriented x3, falling asleep in chair HEENT- PERRL, EOMI, non injected  sclera, pink conjunctiva, MMM, oropharynx clear Neck- Supple, no thyromegaly CVS- RRR, no murmur RESP-CTAB ABD-NABS,soft,NT,ND Psych- tired/sleep, not anxious, normal speech  Skin- right hip hyperpigmented oval shaped nevus 1-2cm long, raised with scaliness in center, NT  EXT- No edema Pulses- Radial, DP- palpated   Procedure- Mole removal -p shave biopsy  Procedure explained to patient questions answered benefits and risks discussed verbal consent obtained. Antiseptic-betadine Anesthesia-lidocaine 1% with ePI 1CC Razor blade used to shave lesion off  Minimal blood loss,  Patient tolerated procedure well Bandage applied with antibiotic ointment        Assessment & Plan:      Problem List Items Addressed This Visit      Unprioritized   Anxiety   Chronic pain syndrome   Relevant Medications   oxyCODONE (ROXICODONE) 15 MG immediate release tablet   CKD (chronic kidney disease) stage 3, GFR 30-59 ml/min   COPD (chronic obstructive pulmonary disease) ([facility])    No recent exacerbation, continues to smoke No change to inhalers       Depression    deprssion and anxiety  Significant family stressors and financial stressors Will refer her to psychiatry      Diabetes mellitus ([place])    Uncontrolled, she is on insulin pump which I dont manage so will need to find her a new endocrinologist Also has this new omnipod system  Will check renal function and A1C today  Relevant Orders   Hemoglobin A1c (Completed)   Essential hypertension, benign - Primary    Blood pressure looks okay,no change to meds      Relevant Orders   TSH (Completed)   Comprehensive metabolic panel (Completed)   CBC with Differential/Platelet (Completed)   Hyperlipidemia    Pt on statin drug      Relevant Orders   Lipid panel (Completed)   Hypothyroidism    Recheck TSH, continue thyroid replacement      Relevant Orders   TSH (Completed)   Peripheral neuropathy   Tobacco use    Other  Visit Diagnoses    Neoplasm of uncertain behavior       Relevant Orders   Pathology Report ([facility])   Need for immunization against influenza       Relevant Orders   Flu Vaccine QUAD 36+ mos IM (Completed)      Note: This dictation was prepared with Dragon dictation along with smaller phrase technology. Any transcriptional errors that result from this process are unintentional.

## 2019-01-25 ENCOUNTER — Encounter: Payer: Self-pay | Admitting: Family Medicine

## 2019-01-25 LAB — TSH: TSH: 13.82 mIU/L — ABNORMAL HIGH

## 2019-01-25 LAB — COMPREHENSIVE METABOLIC PANEL
AG Ratio: 1.3 (calc) (ref 1.0–2.5)
ALT: 17 U/L (ref 6–29)
AST: 27 U/L (ref 10–35)
Albumin: 3.9 g/dL (ref 3.6–5.1)
Alkaline phosphatase (APISO): 86 U/L (ref 37–153)
BUN/Creatinine Ratio: 10 (calc) (ref 6–22)
BUN: 18 mg/dL (ref 7–25)
CO2: 18 mmol/L — ABNORMAL LOW (ref 20–32)
Calcium: 8.7 mg/dL (ref 8.6–10.4)
Chloride: 107 mmol/L (ref 98–110)
Creat: 1.78 mg/dL — ABNORMAL HIGH (ref 0.50–1.05)
Globulin: 2.9 g/dL (calc) (ref 1.9–3.7)
Glucose, Bld: 145 mg/dL — ABNORMAL HIGH (ref 65–99)
Potassium: 4.1 mmol/L (ref 3.5–5.3)
Sodium: 136 mmol/L (ref 135–146)
Total Bilirubin: 0.5 mg/dL (ref 0.2–1.2)
Total Protein: 6.8 g/dL (ref 6.1–8.1)

## 2019-01-25 LAB — LIPID PANEL
Cholesterol: 157 mg/dL (ref <200)
HDL: 47 mg/dL — ABNORMAL LOW (ref >=50)
LDL Cholesterol (Calc): 91 mg/dL (calc)
Non-HDL Cholesterol (Calc): 110 mg/dL (calc) (ref <130)
Total CHOL/HDL Ratio: 3.3 (calc) (ref <5.0)
Triglycerides: 94 mg/dL (ref <150)

## 2019-01-25 LAB — CBC WITH DIFFERENTIAL/PLATELET
Absolute Monocytes: 763 cells/uL (ref 200–950)
Basophils Absolute: 50 cells/uL (ref 0–200)
Basophils Relative: 0.4 %
Eosinophils Absolute: 325 cells/uL (ref 15–500)
Eosinophils Relative: 2.6 %
HCT: 39.9 % (ref 35.0–45.0)
Hemoglobin: 13.6 g/dL (ref 11.7–15.5)
Lymphs Abs: 4413 cells/uL — ABNORMAL HIGH (ref 850–3900)
MCH: 34.5 pg — ABNORMAL HIGH (ref 27.0–33.0)
MCHC: 34.1 g/dL (ref 32.0–36.0)
MCV: 101.3 fL — ABNORMAL HIGH (ref 80.0–100.0)
MPV: 11.3 fL (ref 7.5–12.5)
Monocytes Relative: 6.1 %
Neutro Abs: 6950 cells/uL (ref 1500–7800)
Neutrophils Relative %: 55.6 %
Platelets: 216 10*3/uL (ref 140–400)
RBC: 3.94 10*6/uL (ref 3.80–5.10)
RDW: 16 % — ABNORMAL HIGH (ref 11.0–15.0)
Total Lymphocyte: 35.3 %
WBC: 12.5 10*3/uL — ABNORMAL HIGH (ref 3.8–10.8)

## 2019-01-25 LAB — HEMOGLOBIN A1C
Hgb A1c MFr Bld: 9.5 % of total Hgb — ABNORMAL HIGH (ref <5.7)
Mean Plasma Glucose: 226 (calc)
eAG (mmol/L): 12.5 (calc)

## 2019-01-25 NOTE — Assessment & Plan Note (Signed)
Recheck TSH, continue thyroid replacement

## 2019-01-25 NOTE — Assessment & Plan Note (Signed)
Blood pressure looks okay,no change to meds

## 2019-01-25 NOTE — Assessment & Plan Note (Addendum)
deprssion and anxiety  Significant family stressors and financial stressors Will refer her to psychiatry No change in meds

## 2019-01-25 NOTE — Assessment & Plan Note (Signed)
No recent exacerbation, continues to smoke No change to inhalers

## 2019-01-25 NOTE — Assessment & Plan Note (Signed)
Pt on statin drug

## 2019-01-25 NOTE — Assessment & Plan Note (Signed)
Uncontrolled, she is on insulin pump which I dont manage so will need to find her a new endocrinologist Also has this new omnipod system  Will check renal function and A1C today

## 2019-01-27 MED ORDER — ZOLPIDEM TARTRATE ER 12.5 MG PO TBCR: 12.5000 mg | EXTENDED_RELEASE_TABLET | Freq: Every evening | ORAL | 2 refills | Status: DC | PRN

## 2019-01-27 NOTE — Telephone Encounter (Signed)
Checked status of PA via [place].   PA [id number] approved 01/24/2019 - 07/23/2019.   Please re-send to make pharmacy aware.

## 2019-01-27 NOTE — Addendum Note (Signed)
Addended by: Vic Blackbird F on: 01/27/2019 01:54 PM   Modules accepted: Orders

## 2019-01-28 LAB — PATHOLOGY REPORT

## 2019-01-28 LAB — TISSUE SPECIMEN

## 2019-01-29 ENCOUNTER — Other Ambulatory Visit: Payer: Self-pay | Admitting: *Deleted

## 2019-01-29 DIAGNOSIS — N1832 Chronic kidney disease, stage 3b: Secondary | ICD-10-CM

## 2019-01-29 DIAGNOSIS — D72829 Elevated white blood cell count, unspecified: Secondary | ICD-10-CM

## 2019-01-29 MED ORDER — MUPIROCIN 2 % EX OINT: 1.0000 "application " | TOPICAL_OINTMENT | Freq: Two times a day (BID) | CUTANEOUS | 0 refills | Status: DC

## 2019-01-29 MED ORDER — NEOMYCIN-POLYMYXIN-HC 3.5-10000-1 OT SOLN: 4.0000 [drp] | Freq: Four times a day (QID) | OTIC | 0 refills | Status: DC

## 2019-01-29 MED ORDER — INSULIN ASPART 100 UNIT/ML FLEXPEN: PEN_INJECTOR | SUBCUTANEOUS | 11 refills | Status: DC

## 2019-01-30 ENCOUNTER — Other Ambulatory Visit: Payer: Self-pay | Admitting: *Deleted

## 2019-01-30 MED ORDER — INSULIN ASPART 100 UNIT/ML FLEXPEN: PEN_INJECTOR | SUBCUTANEOUS | 11 refills | Status: DC

## 2019-02-06 DIAGNOSIS — Z79899 Other long term (current) drug therapy: Secondary | ICD-10-CM | POA: Diagnosis not present

## 2019-02-06 DIAGNOSIS — E1142 Type 2 diabetes mellitus with diabetic polyneuropathy: Secondary | ICD-10-CM | POA: Diagnosis not present

## 2019-02-06 DIAGNOSIS — S0081XA Abrasion of other part of head, initial encounter: Secondary | ICD-10-CM | POA: Diagnosis not present

## 2019-02-06 DIAGNOSIS — Z79891 Long term (current) use of opiate analgesic: Secondary | ICD-10-CM | POA: Diagnosis not present

## 2019-02-10 ENCOUNTER — Other Ambulatory Visit: Payer: Self-pay | Admitting: Cardiology

## 2019-02-10 DIAGNOSIS — Z20822 Contact with and (suspected) exposure to covid-19: Secondary | ICD-10-CM

## 2019-02-11 LAB — NOVEL CORONAVIRUS, NAA: SARS-CoV-2, NAA: NOT DETECTED

## 2019-02-13 ENCOUNTER — Other Ambulatory Visit: Payer: Self-pay | Admitting: Endocrinology

## 2019-02-17 ENCOUNTER — Encounter: Payer: Self-pay | Admitting: "Endocrinology

## 2019-02-17 ENCOUNTER — Ambulatory Visit (INDEPENDENT_AMBULATORY_CARE_PROVIDER_SITE_OTHER): Payer: Medicaid Other | Admitting: "Endocrinology

## 2019-02-17 ENCOUNTER — Other Ambulatory Visit: Payer: Self-pay

## 2019-02-17 VITALS — BP 118/77 | HR 73 | Ht 64.0 in | Wt 169.0 lb

## 2019-02-17 DIAGNOSIS — N1831 Chronic kidney disease, stage 3a: Secondary | ICD-10-CM

## 2019-02-17 DIAGNOSIS — E1121 Type 2 diabetes mellitus with diabetic nephropathy: Secondary | ICD-10-CM | POA: Diagnosis not present

## 2019-02-17 DIAGNOSIS — Z794 Long term (current) use of insulin: Secondary | ICD-10-CM

## 2019-02-17 DIAGNOSIS — I10 Essential (primary) hypertension: Secondary | ICD-10-CM

## 2019-02-17 DIAGNOSIS — E782 Mixed hyperlipidemia: Secondary | ICD-10-CM | POA: Diagnosis not present

## 2019-02-17 DIAGNOSIS — E890 Postprocedural hypothyroidism: Secondary | ICD-10-CM

## 2019-02-17 MED ORDER — INSULIN ASPART 100 UNIT/ML FLEXPEN: 5.0000 [IU] | PEN_INJECTOR | Freq: Three times a day (TID) | SUBCUTANEOUS | 2 refills | Status: DC

## 2019-02-17 MED ORDER — LEVOTHYROXINE SODIUM 100 MCG PO TABS: 100.0000 ug | ORAL_TABLET | Freq: Every day | ORAL | 2 refills | Status: DC

## 2019-02-17 NOTE — Patient Instructions (Signed)

## 2019-02-17 NOTE — Progress Notes (Signed)
Endocrinology Consult Note       02/17/2019, 4:18 PM   Subjective:    Patient ID: Kathryn Oconnell, female    DOB: Dec 15, 1965.  Kathryn Oconnell is being seen in consultation for management of currently uncontrolled symptomatic diabetes requested by  Alycia Rossetti, MD.   Past Medical History:  Diagnosis Date  . Anxiety   . Chronic kidney disease   . COPD (chronic obstructive pulmonary disease) ([place])   . Depression   . Diabetes mellitus without complication ([place])   . Eczema   . Graves disease   . HSV-2 infection   . Hyperlipidemia   . Hypertension   . Hypothyroid   . Lung nodule   . Menopausal vaginal dryness 02/24/2014  . Migraines   . Neuropathy   . Tobacco use   . Trouble in sleeping 06/25/2015    Past Surgical History:  Procedure Laterality Date  . CESAREAN SECTION     x 2  . FOOT SURGERY     5th digit left foot / Great toe, 2ND TOE  . THYROID SURGERY      Social History   Socioeconomic History  . Marital status: Married    Spouse name: Not on file  . Number of children: Not on file  . Years of education: Not on file  . Highest education level: Not on file  Occupational History  . Not on file  Social Needs  . Financial resource strain: Not on file  . Food insecurity    Worry: Not on file    Inability: Not on file  . Transportation needs    Medical: Not on file    Non-medical: Not on file  Tobacco Use  . Smoking status: Current Every Day Smoker    Packs/day: 0.50    Years: 35.00    Pack years: 17.50    Types: Cigarettes  . Smokeless tobacco: Never Used  Substance and Sexual Activity  . Alcohol use: No  . Drug use: No  . Sexual activity: Not Currently    Birth control/protection: Post-menopausal  Lifestyle  . Physical activity    Days per week: Not on file    Minutes per session: Not on file  . Stress: Not on file  Relationships  . Social Herbalist  on phone: Not on file    Gets together: Not on file    Attends religious service: Not on file    Active member of club or organization: Not on file    Attends meetings of clubs or organizations: Not on file    Relationship status: Not on file  Other Topics Concern  . Not on file  Social History Narrative  . Not on file    Family History  Problem Relation Age of Onset  . Stroke Mother        x 3  . Cancer Mother        breast   . Thyroid disease Mother   . Hyperlipidemia Mother   . Cancer Father        stomach cancer   . Hypertension Father   .  Heart attack Father 83  . Gout Brother   . Migraines Son   . Hypertension Paternal Uncle   . Heart disease Maternal Grandmother   . Heart attack Maternal Grandmother   . Heart disease Maternal Grandfather   . Heart attack Maternal Grandfather   . Cancer Paternal Grandmother   . Diabetes Paternal Grandfather   . Asthma Daughter     Outpatient Encounter Medications as of 02/17/2019  Medication Sig  . Insulin Degludec (TRESIBA FLEXTOUCH [place]) Inject 20 Units into the skin at bedtime.  Marland Kitchen aspirin 81 MG tablet Take 81 mg by mouth daily.  Marland Kitchen atorvastatin (LIPITOR) 40 MG tablet Take 1 tablet (40 mg total) by mouth daily.  . Azelastine-Fluticasone 137-50 MCG/ACT SUSP Place into the nose.  . chlorhexidine (PERIDEX) 0.12 % solution USE AS DIRECTED 15 MLS IN THE MOUTH OR THROAT TWICE DAILY  . Continuous Blood Gluc Sensor (FREESTYLE LIBRE 14 DAY SENSOR) MISC 1 each by Other route every 14 (fourteen) days.  . cyclobenzaprine (FLEXERIL) 10 MG tablet Take 1 tablet (10 mg total) by mouth 3 (three) times daily as needed.  Marland Kitchen glucose blood test strip 1 each by Other route 4 (four) times daily. Use as instructed to check blood sugar four times daily.  . insulin aspart (NOVOLOG) 100 UNIT/ML FlexPen Inject 5-11 Units into the skin 3 (three) times daily before meals. Use up to 50U QD SQ per insulin pump  . Insulin Disposable Pump (OMNIPOD DASH 5 PACK PODS)  MISC 1 each by Does not apply route every 3 (three) days. APPLY 1 OMNIPOD TO BODY ONCE EVERY 3 DAYS FOR INSULIN DELIVERY.  . Insulin Pen Needle (BD PEN NEEDLE MICRO U/F) 32G X 6 MM MISC Use as directed to inject insulin SQ.  Marland Kitchen levothyroxine (SYNTHROID) 100 MCG tablet Take 1 tablet (100 mcg total) by mouth daily before breakfast.  . LORazepam (ATIVAN) 1 MG tablet Take 1 tablet (1 mg total) by mouth 3 (three) times daily as needed. for anxiety  . medroxyPROGESTERone (PROVERA) 2.5 MG tablet Take 1 tablet by mouth once daily  . metoprolol tartrate (LOPRESSOR) 25 MG tablet Take 1 tablet (25 mg total) by mouth 2 (two) times daily.  . mupirocin ointment (BACTROBAN) 2 % Place 1 application into the nose 2 (two) times daily.  Marland Kitchen oxyCODONE (ROXICODONE) 15 MG immediate release tablet Take 1 tablet (15 mg total) by mouth every 4 (four) hours as needed for pain.  . Semaglutide,0.25 or 0.5MG/DOS, (OZEMPIC, 0.25 OR 0.5 MG/DOSE,) 2 MG/1.5ML SOPN Inject 0.5 mg into the skin once a week.  . sertraline (ZOLOFT) 100 MG tablet Take 1 tablet by mouth once daily  . valACYclovir (VALTREX) 1000 MG tablet Take 1 tablet (1,000 mg total) by mouth daily as needed.  . zolpidem (AMBIEN CR) 12.5 MG CR tablet Take 1 tablet (12.5 mg total) by mouth at bedtime as needed.  . [DISCONTINUED] insulin aspart (NOVOLOG) 100 UNIT/ML FlexPen Use up to 50U QD SQ per insulin pump  . [DISCONTINUED] levothyroxine (SYNTHROID) 88 MCG tablet Take 1 tablet (88 mcg total) by mouth daily.   No facility-administered encounter medications on file as of 02/17/2019.     ALLERGIES: Allergies  Allergen Reactions  . Erythromycin Other (See Comments)    Stomach cramps  . Imitrex [Sumatriptan] Other (See Comments)    Migraine and increased BP  . Sulfa Antibiotics Other (See Comments)    Causes stomach cramps  . Topamax [Topiramate] Nausea And Vomiting    Nausea and worsening  VACCINATION STATUS: Immunization History  Administered Date(s)  Administered  . Hepatitis B 11/11/1998, 12/16/1998  . Hepatitis B, adult 06/30/2017, 08/24/2017  . Influenza Whole 12/14/2006  . Influenza,inj,Quad PF,6+ Mos 01/08/2015, 02/08/2016, 01/03/2017, 01/02/2018, 01/24/2019  . Influenza-Unspecified 12/02/2012  . MMR 07/14/1998  . Pneumococcal Polysaccharide-23 05/07/2006, 06/30/2017  . Td 04/04/2001, 02/28/2010  . Tdap 02/28/2010, 01/08/2015    Diabetes She presents for her initial diabetic visit. She has type 2 diabetes mellitus. Onset time: She was diagnosed at approximate age of 23 years. Her disease course has been worsening. There are no hypoglycemic associated symptoms. Pertinent negatives for hypoglycemia include no confusion, headaches, pallor, seizures or tremors. Associated symptoms include blurred vision, fatigue, polydipsia and polyuria. Pertinent negatives for diabetes include no chest pain and no polyphagia. There are no hypoglycemic complications. Symptoms are worsening. Diabetic complications include nephropathy. Risk factors for coronary artery disease include diabetes mellitus, dyslipidemia, family history, hypertension, sedentary lifestyle, post-menopausal and tobacco exposure. Current diabetic treatment includes insulin injections (He is on insulin pump for the last year using NovoLog, her A1c has increased from 7.7% to 9.5% over the last 11 months.). Her weight is increasing steadily. She is following a generally unhealthy diet. When asked about meal planning, she reported none. She has had a previous visit with a dietitian. She rarely participates in exercise. Her overall blood glucose range is >200 mg/dl. Eye exam is current.  Thyroid Problem Presents for initial visit. Symptoms include fatigue. Patient reports no cold intolerance, diarrhea, heat intolerance, palpitations or tremors. Past treatments include levothyroxine. Prior procedures include thyroidectomy. Her past medical history is significant for diabetes and hyperlipidemia.   Hyperlipidemia This is a chronic problem. The current episode started more than 1 year ago. The problem is controlled. Exacerbating diseases include diabetes and hypothyroidism. Pertinent negatives include no chest pain, myalgias or shortness of breath. Risk factors for coronary artery disease include diabetes mellitus, dyslipidemia, hypertension, a sedentary lifestyle and post-menopausal.  Hypertension This is a chronic problem. The current episode started more than 1 year ago. Associated symptoms include blurred vision. Pertinent negatives include no chest pain, headaches, palpitations or shortness of breath. Risk factors for coronary artery disease include diabetes mellitus, dyslipidemia, family history, post-menopausal state, sedentary lifestyle and smoking/tobacco exposure. Past treatments include beta blockers. Hypertensive end-organ damage includes kidney disease. Identifiable causes of hypertension include a thyroid problem.     Review of Systems  Constitutional: Positive for fatigue. Negative for chills, fever and unexpected weight change.  HENT: Negative for trouble swallowing and voice change.   Eyes: Positive for blurred vision. Negative for visual disturbance.  Respiratory: Negative for cough, shortness of breath and wheezing.   Cardiovascular: Negative for chest pain, palpitations and leg swelling.       No Shortness of breath  Gastrointestinal: Negative for abdominal pain, diarrhea, nausea and vomiting.  Endocrine: Positive for polydipsia and polyuria. Negative for cold intolerance, heat intolerance and polyphagia.  Genitourinary: Negative for frequency, hematuria and urgency.  Musculoskeletal: Negative for arthralgias and myalgias.  Skin: Negative for color change, pallor, rash and wound.  Neurological: Negative for tremors, seizures and headaches.  Hematological: Does not bruise/bleed easily.  Psychiatric/Behavioral: Negative for confusion, hallucinations and suicidal ideas.     Objective:    BP 118/77   Pulse 73   Ht 5' 4"  (1.626 m)   Wt 169 lb (76.7 kg)   LMP 06/18/2013 Comment: irregular  BMI 29.01 kg/m   Wt Readings from Last 3 Encounters:  02/17/19 169 lb (76.7 kg)  01/24/19 172 lb (78 kg)  10/02/18 174 lb 9.6 oz (79.2 kg)     Physical Exam Constitutional:      Appearance: She is well-developed.  HENT:     Head: Normocephalic and atraumatic.  Neck:     Musculoskeletal: Normal range of motion and neck supple.     Thyroid: No thyromegaly.     Trachea: No tracheal deviation.  Cardiovascular:     Rate and Rhythm: Normal rate and regular rhythm.  Pulmonary:     Effort: Pulmonary effort is normal.  Abdominal:     Tenderness: There is no abdominal tenderness. There is no guarding.  Musculoskeletal: Normal range of motion.  Skin:    General: Skin is warm and dry.     Coloration: Skin is not pale.     Findings: No erythema or rash.  Neurological:     Mental Status: She is alert and oriented to person, place, and time.     Cranial Nerves: No cranial nerve deficit.     Coordination: Coordination normal.     Deep Tendon Reflexes: Reflexes are normal and symmetric.  Psychiatric:        Judgment: Judgment normal.     CMP     Component Value Date/Time   NA 136 01/24/2019 1220   NA 140 10/21/2012 1547   K 4.1 01/24/2019 1220   CL 107 01/24/2019 1220   CO2 18 (L) 01/24/2019 1220   GLUCOSE 145 (H) 01/24/2019 1220   GLUCOSE 161 (H) 10/27/2014 1427   BUN 18 01/24/2019 1220   BUN 21 10/21/2012 1547   CREATININE 1.78 (H) 01/24/2019 1220   CALCIUM 8.7 01/24/2019 1220   PROT 6.8 01/24/2019 1220   PROT 7.2 10/21/2012 1547   ALBUMIN 4.2 01/22/2017 1204   ALBUMIN 4.6 10/21/2012 1547   AST 27 01/24/2019 1220   ALT 17 01/24/2019 1220   ALKPHOS 58 01/22/2017 1204   BILITOT 0.5 01/24/2019 1220   GFRNONAA 54 (L) 07/28/2014 1546   GFRAA 62 07/28/2014 1546     Diabetic Labs (most recent): Lab Results  Component Value Date   HGBA1C 9.5 (H)  01/24/2019   HGBA1C 9.1 (H) 08/30/2018   HGBA1C 7.7 (H) 03/25/2018     Lipid Panel ( most recent) Lipid Panel     Component Value Date/Time   CHOL 157 01/24/2019 1220   TRIG 94 01/24/2019 1220   HDL 47 (L) 01/24/2019 1220   CHOLHDL 3.3 01/24/2019 1220   VLDL 14 02/08/2016 1435   LDLCALC 91 01/24/2019 1220   LDLDIRECT 95 10/04/2015 1427      Lab Results  Component Value Date   TSH 13.82 (H) 01/24/2019   TSH 0.15 (L) 08/30/2018   TSH 0.71 03/25/2018   TSH 0.63 06/27/2017   TSH 0.67 01/22/2017   TSH 0.68 08/25/2016   TSH 1.33 05/25/2016   TSH 1.16 05/10/2016   TSH 7.75 (H) 03/10/2016   TSH 2.71 11/17/2015   FREET4 1.1 08/30/2018   FREET4 1.1 03/25/2018   FREET4 1.1 06/27/2017   FREET4 1.10 08/25/2016   FREET4 0.88 11/17/2015   FREET4 1.17 03/24/2015   FREET4 1.07 07/28/2014   FREET4 1.11 03/25/2014   FREET4 1.40 01/27/2014   FREET4 1.03 04/16/2007       Assessment & Plan:   1. Type 2 diabetes mellitus with stage 3a chronic kidney disease, with long-term current use of insulin ([place]) - MACKLYN GLANDON has currently uncontrolled symptomatic type 2 DM since  53 years of age,  with most recent A1c of 9.5 %. Recent labs reviewed. - I had a long discussion with her about the progressive nature of diabetes and the pathology behind its complications. -her diabetes is complicated by nephropathy, sedentary life, chronic heavy smoking and she remains at a high risk for more acute and chronic complications which include CAD, CVA, CKD, retinopathy, and neuropathy. These are all discussed in detail with her.  - I have counseled her on diet  and weight management  by adopting a carbohydrate restricted/protein rich diet. Patient is encouraged to switch to  unprocessed or minimally processed     complex starch and increased protein intake (animal or plant source), fruits, and vegetables. -  she is advised to stick to a routine mealtimes to eat 3 meals  a day and avoid unnecessary  snacks ( to snack only to correct hypoglycemia).   - she admits that there is a room for improvement in her food and drink choices. - Suggestion is made for her to avoid simple carbohydrates  from her diet including Cakes, Sweet Desserts, Ice Cream, Soda (diet and regular), Sweet Tea, Candies, Chips, Cookies, Store Bought Juices, Alcohol in Excess of  1-2 drinks a day, Artificial Sweeteners,  Coffee Creamer, and "Sugar-free" Products. This will help patient to have more stable blood glucose profile and potentially avoid unintended weight gain.  - she will be scheduled with Jearld Fenton, RDN, CDE for diabetes education.  - I have approached her with the following individualized plan to manage  her diabetes and patient agrees:   -Patient will need multiple daily injections of insulin in order for her to achieve and maintain control of diabetes to target.  She has lost control of diabetes since she switched to insulin pump.  She is willing to suspend insulin pump and treat with basal/bolus insulin until she achieves control.    -I discussed initiated Tresiba 20 units nightly, discussed and initiated NovoLog 5 units 3 times a day with meals  for pre-meal BG readings of 90-133m/dl, plus patient specific correction dose for unexpected hyperglycemia above 1566mdl, associated with strict monitoring of glucose 4 times a day-before meals and at bedtime. - she is warned not to take insulin without proper monitoring per orders. - Adjustment parameters are given to her for hypo and hyperglycemia in writing. - she is encouraged to call clinic for blood glucose levels less than 70 or above 200 mg /dl. -She is advised to discontinue insulin pump 1 hour after her first NovoLog injection. - she is advised to continue Ozempic 0.5 mg subcutaneously weekly, therapeutically suitable for patient .  - she is not a candidate for Metformin, SGLT2 inhibitors due to concurrent renal insufficiency.  - Specific targets  for  A1c;  LDL, HDL, Triglycerides, and  Waist Circumference were discussed with the patient.  2) Blood Pressure /Hypertension:  her blood pressure is  controlled to target.   she is advised to continue her current medications including metoprolol 25 mg p.o. twice daily mg p.o. daily with breakfast .  3) Lipids/Hyperlipidemia:   Review of her recent lipid panel showed un controlled  LDL at 95 .  she  is advised to continue    atorvastatin 40 mg daily at bedtime.  Side effects and precautions discussed with her.  4)  Weight/Diet:  Body mass index is 29.01 kg/m.  -    she is  a candidate for weight loss. I discussed with her the fact that loss of 5 - 10% of  her  current body weight will have the most impact on her diabetes management.  Exercise, and detailed carbohydrates information provided  -  detailed on discharge instructions.  5) hypothyroidism Postsurgical, reportedly for uncontrolled hyperthyroidism.  Based on her recent labs, she would benefit from slight increase in her levothyroxine.  I discussed and increased her levothyroxine to 100 mcg p.o. daily before breakfast.     - We discussed about the correct intake of her thyroid hormone, on empty stomach at fasting, with water, separated by at least 30 minutes from breakfast and other medications,  and separated by more than 4 hours from calcium, iron, multivitamins, acid reflux medications (PPIs). -Patient is made aware of the fact that thyroid hormone replacement is needed for life, dose to be adjusted by periodic monitoring of thyroid function tests.  6) Chronic Care/Health Maintenance:  -she  is on  Statin medications and  is encouraged to initiate and continue to follow up with Ophthalmology, Dentist,  Podiatrist at least yearly or according to recommendations, and advised to  Quit smoking. I have recommended yearly flu vaccine and pneumonia vaccine at least every 5 years; moderate intensity exercise for up to 150 minutes weekly; and   sleep for at least 7 hours a day.  - she is  advised to maintain close follow up with [facility], Modena Nunnery, MD for primary care needs, as well as her other providers for optimal and coordinated care.  - Time spent with the patient: 60 minutes, of which >50% was spent in obtaining information about her symptoms, reviewing her previous labs/studies, evaluations, and treatments, counseling her about her currently uncontrolled, complicated type 2 diabetes; hypothyroidism, hyperlipidemia, hypertension, and developing plans for long term treatment based on the latest standards of care/guidelines.  Please refer to " Patient Self Inventory" in the Media  tab for reviewed elements of pertinent patient history.  [name] participated in the discussions, expressed understanding, and voiced agreement with the above plans.  All questions were answered to her satisfaction. she is encouraged to contact clinic should she have any questions or concerns prior to her return visit.  Follow up plan: - Return in about 10 days (around 02/27/2019) for Follow up with [facility] and Logs Only - no Labs.  Glade Lloyd, MD [facility] [address] [place], [place] [address] Phone: [phone]  Fax: [phone]    02/17/2019, 4:18 PM  This note was partially dictated with voice recognition software. Similar sounding words can be transcribed inadequately or may not  be corrected upon review.

## 2019-02-18 DIAGNOSIS — L249 Irritant contact dermatitis, unspecified cause: Secondary | ICD-10-CM | POA: Diagnosis not present

## 2019-02-28 ENCOUNTER — Other Ambulatory Visit: Payer: Self-pay | Admitting: Endocrinology

## 2019-03-04 ENCOUNTER — Other Ambulatory Visit: Payer: Self-pay

## 2019-03-04 ENCOUNTER — Ambulatory Visit (INDEPENDENT_AMBULATORY_CARE_PROVIDER_SITE_OTHER): Payer: Medicaid Other | Admitting: "Endocrinology

## 2019-03-04 ENCOUNTER — Encounter: Payer: Self-pay | Admitting: "Endocrinology

## 2019-03-04 DIAGNOSIS — E1121 Type 2 diabetes mellitus with diabetic nephropathy: Secondary | ICD-10-CM

## 2019-03-04 DIAGNOSIS — N1831 Chronic kidney disease, stage 3a: Secondary | ICD-10-CM | POA: Diagnosis not present

## 2019-03-04 DIAGNOSIS — Z794 Long term (current) use of insulin: Secondary | ICD-10-CM | POA: Diagnosis not present

## 2019-03-04 DIAGNOSIS — E782 Mixed hyperlipidemia: Secondary | ICD-10-CM

## 2019-03-04 DIAGNOSIS — I10 Essential (primary) hypertension: Secondary | ICD-10-CM

## 2019-03-04 DIAGNOSIS — E890 Postprocedural hypothyroidism: Secondary | ICD-10-CM

## 2019-03-04 NOTE — Progress Notes (Signed)
03/04/2019, 4:23 PM                                                    Endocrinology Telehealth Visit Follow up Note -During COVID -19 Pandemic  This visit type was conducted due to national recommendations for restrictions regarding the COVID-19 Pandemic  in an effort to limit this patient's exposure and mitigate transmission of the corona virus.  Due to her co-morbid illnesses, Kathryn Oconnell is at  moderate to high risk for complications without adequate follow up.  This format is felt to be most appropriate for her at this time.  I connected with this patient on `03/04/2019 by telephone and verified that I am speaking with the correct person using two identifiers. Kathryn Oconnell, 06-02-1965. she has verbally consented to this visit. All issues noted in this document were discussed and addressed. The format was not optimal for physical exam.    Subjective:    Patient ID: Kathryn Oconnell, female    DOB: 1966-03-14.  Kathryn Oconnell is being made in telehealth via telephone in follow-up after she was seen in consultation for management of currently uncontrolled symptomatic diabetes requested by  Alycia Rossetti, MD.   Past Medical History:  Diagnosis Date  . Anxiety   . Chronic kidney disease   . COPD (chronic obstructive pulmonary disease) ([place])   . Depression   . Diabetes mellitus without complication ([place])   . Eczema   . Graves disease   . HSV-2 infection   . Hyperlipidemia   . Hypertension   . Hypothyroid   . Lung nodule   . Menopausal vaginal dryness 02/24/2014  . Migraines   . Neuropathy   . Tobacco use   . Trouble in sleeping 06/25/2015    Past Surgical History:  Procedure Laterality Date  . CESAREAN SECTION     x 2  . FOOT SURGERY     5th digit left foot / Great toe, 2ND TOE  . THYROID SURGERY      Social History   Socioeconomic History  . Marital status: Married    Spouse name: Not  on file  . Number of children: Not on file  . Years of education: Not on file  . Highest education level: Not on file  Occupational History  . Not on file  Social Needs  . Financial resource strain: Not on file  . Food insecurity    Worry: Not on file    Inability: Not on file  . Transportation needs    Medical: Not on file    Non-medical: Not on file  Tobacco Use  . Smoking status: Current Every Day Smoker    Packs/day: 0.50    Years: 35.00    Pack years: 17.50    Types: Cigarettes  . Smokeless tobacco: Never Used  Substance and Sexual Activity  . Alcohol use: No  . Drug use: No  . Sexual activity: Not Currently    Birth  control/protection: Post-menopausal  Lifestyle  . Physical activity    Days per week: Not on file    Minutes per session: Not on file  . Stress: Not on file  Relationships  . Social Herbalist on phone: Not on file    Gets together: Not on file    Attends religious service: Not on file    Active member of club or organization: Not on file    Attends meetings of clubs or organizations: Not on file    Relationship status: Not on file  Other Topics Concern  . Not on file  Social History Narrative  . Not on file    Family History  Problem Relation Age of Onset  . Stroke Mother        x 3  . Cancer Mother        breast   . Thyroid disease Mother   . Hyperlipidemia Mother   . Cancer Father        stomach cancer   . Hypertension Father   . Heart attack Father 26  . Gout Brother   . Migraines Son   . Hypertension Paternal Uncle   . Heart disease Maternal Grandmother   . Heart attack Maternal Grandmother   . Heart disease Maternal Grandfather   . Heart attack Maternal Grandfather   . Cancer Paternal Grandmother   . Diabetes Paternal Grandfather   . Asthma Daughter     Outpatient Encounter Medications as of 03/04/2019  Medication Sig  . aspirin 81 MG tablet Take 81 mg by mouth daily.  Marland Kitchen atorvastatin (LIPITOR) 40 MG tablet Take 1  tablet (40 mg total) by mouth daily.  . Azelastine-Fluticasone 137-50 MCG/ACT SUSP Place into the nose.  . chlorhexidine (PERIDEX) 0.12 % solution USE AS DIRECTED 15 MLS IN THE MOUTH OR THROAT TWICE DAILY  . Continuous Blood Gluc Sensor (FREESTYLE LIBRE 14 DAY SENSOR) MISC 1 each by Other route every 14 (fourteen) days.  . cyclobenzaprine (FLEXERIL) 10 MG tablet Take 1 tablet (10 mg total) by mouth 3 (three) times daily as needed.  Marland Kitchen glucose blood test strip 1 each by Other route 4 (four) times daily. Use as instructed to check blood sugar four times daily.  . insulin aspart (NOVOLOG) 100 UNIT/ML FlexPen Inject 5-11 Units into the skin 3 (three) times daily before meals. Use up to 50U QD SQ per insulin pump  . Insulin Degludec (TRESIBA FLEXTOUCH [place]) Inject 15 Units into the skin at bedtime.  . Insulin Disposable Pump (OMNIPOD DASH 5 PACK PODS) MISC 1 each by Does not apply route every 3 (three) days. APPLY 1 OMNIPOD TO BODY ONCE EVERY 3 DAYS FOR INSULIN DELIVERY.  . Insulin Pen Needle (BD PEN NEEDLE MICRO U/F) 32G X 6 MM MISC Use as directed to inject insulin SQ.  Marland Kitchen levothyroxine (SYNTHROID) 100 MCG tablet Take 1 tablet (100 mcg total) by mouth daily before breakfast.  . LORazepam (ATIVAN) 1 MG tablet Take 1 tablet (1 mg total) by mouth 3 (three) times daily as needed. for anxiety  . medroxyPROGESTERone (PROVERA) 2.5 MG tablet Take 1 tablet by mouth once daily  . metoprolol tartrate (LOPRESSOR) 25 MG tablet Take 1 tablet (25 mg total) by mouth 2 (two) times daily.  . mupirocin ointment (BACTROBAN) 2 % Place 1 application into the nose 2 (two) times daily.  Marland Kitchen oxyCODONE (ROXICODONE) 15 MG immediate release tablet Take 1 tablet (15 mg total) by mouth every 4 (four) hours as needed  for pain.  . Semaglutide,0.25 or 0.5MG/DOS, (OZEMPIC, 0.25 OR 0.5 MG/DOSE,) 2 MG/1.5ML SOPN Inject 0.5 mg into the skin once a week.  . sertraline (ZOLOFT) 100 MG tablet Take 1 tablet by mouth once daily  . valACYclovir  (VALTREX) 1000 MG tablet Take 1 tablet (1,000 mg total) by mouth daily as needed.  . zolpidem (AMBIEN CR) 12.5 MG CR tablet Take 1 tablet (12.5 mg total) by mouth at bedtime as needed.   No facility-administered encounter medications on file as of 03/04/2019.     ALLERGIES: Allergies  Allergen Reactions  . Erythromycin Other (See Comments)    Stomach cramps  . Imitrex [Sumatriptan] Other (See Comments)    Migraine and increased BP  . Sulfa Antibiotics Other (See Comments)    Causes stomach cramps  . Topamax [Topiramate] Nausea And Vomiting    Nausea and worsening    VACCINATION STATUS: Immunization History  Administered Date(s) Administered  . Hepatitis B 11/11/1998, 12/16/1998  . Hepatitis B, adult 06/30/2017, 08/24/2017  . Influenza Whole 12/14/2006  . Influenza,inj,Quad PF,6+ Mos 01/08/2015, 02/08/2016, 01/03/2017, 01/02/2018, 01/24/2019  . Influenza-Unspecified 12/02/2012  . MMR 07/14/1998  . Pneumococcal Polysaccharide-23 05/07/2006, 06/30/2017  . Td 04/04/2001, 02/28/2010  . Tdap 02/28/2010, 01/08/2015    Diabetes She presents for her follow-up diabetic visit. She has type 2 diabetes mellitus. Onset time: She was diagnosed at approximate age of 55 years. Her disease course has been improving. There are no hypoglycemic associated symptoms. Pertinent negatives for hypoglycemia include no confusion, headaches, pallor, seizures or tremors. Associated symptoms include blurred vision, fatigue, polydipsia and polyuria. Pertinent negatives for diabetes include no chest pain and no polyphagia. There are no hypoglycemic complications. Symptoms are improving. Diabetic complications include nephropathy. Risk factors for coronary artery disease include diabetes mellitus, dyslipidemia, family history, hypertension, sedentary lifestyle, post-menopausal and tobacco exposure. Current diabetic treatment includes insulin injections (He is on insulin pump for the last year using NovoLog, her A1c  has increased from 7.7% to 9.5% over the last 11 months.). She is following a generally unhealthy diet. When asked about meal planning, she reported none. She has had a previous visit with a dietitian. She rarely participates in exercise. Her breakfast blood glucose range is generally 130-140 mg/dl. Her lunch blood glucose range is generally 140-180 mg/dl. Her dinner blood glucose range is generally 140-180 mg/dl. Her bedtime blood glucose range is generally 180-200 mg/dl. Her overall blood glucose range is 180-200 mg/dl. Eye exam is current.  Thyroid Problem Presents for initial visit. Symptoms include fatigue. Patient reports no cold intolerance, diarrhea, heat intolerance, palpitations or tremors. Past treatments include levothyroxine. Prior procedures include thyroidectomy. Her past medical history is significant for diabetes and hyperlipidemia.  Hyperlipidemia This is a chronic problem. The current episode started more than 1 year ago. The problem is controlled. Exacerbating diseases include diabetes and hypothyroidism. Pertinent negatives include no chest pain, myalgias or shortness of breath. Risk factors for coronary artery disease include diabetes mellitus, dyslipidemia, hypertension, a sedentary lifestyle and post-menopausal.  Hypertension This is a chronic problem. The current episode started more than 1 year ago. Associated symptoms include blurred vision. Pertinent negatives include no chest pain, headaches, palpitations or shortness of breath. Risk factors for coronary artery disease include diabetes mellitus, dyslipidemia, family history, post-menopausal state, sedentary lifestyle and smoking/tobacco exposure. Past treatments include beta blockers. Hypertensive end-organ damage includes kidney disease. Identifiable causes of hypertension include a thyroid problem.   Review of systems: Limited as above.  Objective:    LMP  06/18/2013 Comment: irregular  Wt Readings from Last 3 Encounters:   02/17/19 169 lb (76.7 kg)  01/24/19 172 lb (78 kg)  10/02/18 174 lb 9.6 oz (79.2 kg)      CMP     Component Value Date/Time   NA 136 01/24/2019 1220   NA 140 10/21/2012 1547   K 4.1 01/24/2019 1220   CL 107 01/24/2019 1220   CO2 18 (L) 01/24/2019 1220   GLUCOSE 145 (H) 01/24/2019 1220   GLUCOSE 161 (H) 10/27/2014 1427   BUN 18 01/24/2019 1220   BUN 21 10/21/2012 1547   CREATININE 1.78 (H) 01/24/2019 1220   CALCIUM 8.7 01/24/2019 1220   PROT 6.8 01/24/2019 1220   PROT 7.2 10/21/2012 1547   ALBUMIN 4.2 01/22/2017 1204   ALBUMIN 4.6 10/21/2012 1547   AST 27 01/24/2019 1220   ALT 17 01/24/2019 1220   ALKPHOS 58 01/22/2017 1204   BILITOT 0.5 01/24/2019 1220   GFRNONAA 54 (L) 07/28/2014 1546   GFRAA 62 07/28/2014 1546     Diabetic Labs (most recent): Lab Results  Component Value Date   HGBA1C 9.5 (H) 01/24/2019   HGBA1C 9.1 (H) 08/30/2018   HGBA1C 7.7 (H) 03/25/2018     Lipid Panel ( most recent) Lipid Panel     Component Value Date/Time   CHOL 157 01/24/2019 1220   TRIG 94 01/24/2019 1220   HDL 47 (L) 01/24/2019 1220   CHOLHDL 3.3 01/24/2019 1220   VLDL 14 02/08/2016 1435   LDLCALC 91 01/24/2019 1220   LDLDIRECT 95 10/04/2015 1427      Lab Results  Component Value Date   TSH 13.82 (H) 01/24/2019   TSH 0.15 (L) 08/30/2018   TSH 0.71 03/25/2018   TSH 0.63 06/27/2017   TSH 0.67 01/22/2017   TSH 0.68 08/25/2016   TSH 1.33 05/25/2016   TSH 1.16 05/10/2016   TSH 7.75 (H) 03/10/2016   TSH 2.71 11/17/2015   FREET4 1.1 08/30/2018   FREET4 1.1 03/25/2018   FREET4 1.1 06/27/2017   FREET4 1.10 08/25/2016   FREET4 0.88 11/17/2015   FREET4 1.17 03/24/2015   FREET4 1.07 07/28/2014   FREET4 1.11 03/25/2014   FREET4 1.40 01/27/2014   FREET4 1.03 04/16/2007       Assessment & Plan:   1. Type 2 diabetes mellitus with stage 3a chronic kidney disease, with long-term current use of insulin ([place]) - Kathryn Oconnell has currently uncontrolled symptomatic type 2  DM since  53 years of age,  with most recent A1c of 9.5 %. Recent labs reviewed. She reports significantly improved glycemic profile: Fasting between 51-161, lunchtime between 85-1 7 8, presupper between 96-1 59, bedtime between 109-191. - I had a long discussion with her about the progressive nature of diabetes and the pathology behind its complications. -her diabetes is complicated by nephropathy, sedentary life, chronic heavy smoking and she remains at a high risk for more acute and chronic complications which include CAD, CVA, CKD, retinopathy, and neuropathy. These are all discussed in detail with her.  - I have counseled her on diet  and weight management  by adopting a carbohydrate restricted/protein rich diet. Patient is encouraged to switch to  unprocessed or minimally processed     complex starch and increased protein intake (animal or plant source), fruits, and vegetables. -  she is advised to stick to a routine mealtimes to eat 3 meals  a day and avoid unnecessary snacks ( to snack only to correct hypoglycemia).   -  she  admits there is a room for improvement in her diet and drink choices. -  Suggestion is made for her to avoid simple carbohydrates  from her diet including Cakes, Sweet Desserts / Pastries, Ice Cream, Soda (diet and regular), Sweet Tea, Candies, Chips, Cookies, Sweet Pastries,  Store Bought Juices, Alcohol in Excess of  1-2 drinks a day, Artificial Sweeteners, Coffee Creamer, and "Sugar-free" Products. This will help patient to have stable blood glucose profile and potentially avoid unintended weight gain.   - she will be scheduled with Jearld Fenton, RDN, CDE for diabetes education.  - I have approached her with the following individualized plan to manage  her diabetes and patient agrees:   -Has been much better on [facility] insulin pump .  -Due to tight fasting glucose profile, she is advised to lower her Tresiba to 15 units nightly, continue NovoLog 5 units 3 times a day  with meals  for pre-meal BG readings of 90-174m/dl, plus patient specific correction dose for unexpected hyperglycemia above 1565mdl, associated with strict monitoring of glucose 4 times a day-before meals and at bedtime. - she is warned not to take insulin without proper monitoring per orders. - Adjustment parameters are given to her for hypo and hyperglycemia in writing. - she is encouraged to call clinic for blood glucose levels less than 70 or above 200 mg /dl. -She is advised to discontinue insulin pump 1 hour after her first NovoLog injection. - she is advised to continue Ozempic 0.5 mg subcutaneously weekly, therapeutically suitable for patient .  - she is not a candidate for Metformin, SGLT2 inhibitors due to concurrent renal insufficiency.  - Specific targets for  A1c;  LDL, HDL, Triglycerides, and  Waist Circumference were discussed with the patient.  2) Blood Pressure /Hypertension: she is advised to home monitor blood pressure and report if > 140/90 on 2 separate readings.  she is advised to continue her current medications including metoprolol 25 mg p.o. twice daily mg p.o. daily with breakfast .  3) Lipids/Hyperlipidemia:   Review of her recent lipid panel showed un controlled  LDL at 95 .  she  is advised to continue atorvastatin 40 mg daily at bedtime.  Side effects and precautions discussed with her.  4)  Weight/Diet: Her BMI is 2989   she is  a candidate for weight loss. I discussed with her the fact that loss of 5 - 10% of her  current body weight will have the most impact on her diabetes management.  Exercise, and detailed carbohydrates information provided  -  detailed on discharge instructions.  5) hypothyroidism Postsurgical, reportedly for uncontrolled hyperthyroidism.   She is advised to continue levothyroxine 100 mcg p.o. daily before breakfast. - We discussed about the correct intake of her thyroid hormone, on empty stomach at fasting, with water, separated by at  least 30 minutes from breakfast and other medications,  and separated by more than 4 hours from calcium, iron, multivitamins, acid reflux medications (PPIs). -Patient is made aware of the fact that thyroid hormone replacement is needed for life, dose to be adjusted by periodic monitoring of thyroid function tests.  6) Chronic Care/Health Maintenance:  -she  is on  Statin medications and  is encouraged to initiate and continue to follow up with Ophthalmology, Dentist,  Podiatrist at least yearly or according to recommendations, and advised to  Quit smoking. I have recommended yearly flu vaccine and pneumonia vaccine at least every 5 years; moderate intensity exercise for up  to 150 minutes weekly; and  sleep for at least 7 hours a day.  - she is  advised to maintain close follow up with [facility], Modena Nunnery, MD for primary care needs, as well as her other providers for optimal and coordinated care.  - Patient Care Time Today:  25 min, of which >50% was spent in  counseling and the rest reviewing her  current and  previous labs/studies, previous treatments, her blood glucose readings, and medications' doses and developing a plan for long-term care based on the latest recommendations for standards of care.   Kathryn Oconnell participated in the discussions, expressed understanding, and voiced agreement with the above plans.  All questions were answered to her satisfaction. she is encouraged to contact clinic should she have any questions or concerns prior to her return visit.   Follow up plan: - Return in about 9 weeks (around 05/06/2019) for Bring Meter and Logs- A1c in Office, Include 8 log sheets.  Glade Lloyd, MD [facility] [address] [place], [place] [address] Phone: [phone]  Fax: [phone]    03/04/2019, 4:23 PM  This note was partially dictated with voice recognition software. Similar sounding words can be transcribed inadequately  or may not  be corrected upon review.

## 2019-03-05 DIAGNOSIS — Z79899 Other long term (current) drug therapy: Secondary | ICD-10-CM | POA: Diagnosis not present

## 2019-03-05 DIAGNOSIS — Z79891 Long term (current) use of opiate analgesic: Secondary | ICD-10-CM | POA: Diagnosis not present

## 2019-03-05 DIAGNOSIS — E1142 Type 2 diabetes mellitus with diabetic polyneuropathy: Secondary | ICD-10-CM | POA: Diagnosis not present

## 2019-03-08 DIAGNOSIS — L249 Irritant contact dermatitis, unspecified cause: Secondary | ICD-10-CM | POA: Diagnosis not present

## 2019-03-16 ENCOUNTER — Other Ambulatory Visit: Payer: Self-pay | Admitting: Family Medicine

## 2019-03-17 ENCOUNTER — Other Ambulatory Visit: Payer: Self-pay | Admitting: *Deleted

## 2019-03-17 NOTE — Telephone Encounter (Signed)
Ok to refill??  Last office visit 01/24/2019.  Last refill 01/13/2019, #1 refill.

## 2019-03-18 DIAGNOSIS — R0789 Other chest pain: Secondary | ICD-10-CM | POA: Diagnosis not present

## 2019-03-18 DIAGNOSIS — I491 Atrial premature depolarization: Secondary | ICD-10-CM | POA: Diagnosis not present

## 2019-03-18 DIAGNOSIS — R079 Chest pain, unspecified: Secondary | ICD-10-CM | POA: Diagnosis not present

## 2019-03-18 DIAGNOSIS — R457 State of emotional shock and stress, unspecified: Secondary | ICD-10-CM | POA: Diagnosis not present

## 2019-03-25 ENCOUNTER — Other Ambulatory Visit: Payer: Self-pay | Admitting: Family Medicine

## 2019-04-10 DIAGNOSIS — E1142 Type 2 diabetes mellitus with diabetic polyneuropathy: Secondary | ICD-10-CM | POA: Diagnosis not present

## 2019-04-10 DIAGNOSIS — Z79891 Long term (current) use of opiate analgesic: Secondary | ICD-10-CM | POA: Diagnosis not present

## 2019-04-10 DIAGNOSIS — Z79899 Other long term (current) drug therapy: Secondary | ICD-10-CM | POA: Diagnosis not present

## 2019-04-13 ENCOUNTER — Other Ambulatory Visit: Payer: Self-pay | Admitting: Family Medicine

## 2019-04-13 ENCOUNTER — Other Ambulatory Visit: Payer: Self-pay | Admitting: Endocrinology

## 2019-04-13 ENCOUNTER — Encounter: Payer: Self-pay | Admitting: Family Medicine

## 2019-04-14 DIAGNOSIS — R05 Cough: Secondary | ICD-10-CM | POA: Diagnosis not present

## 2019-04-14 DIAGNOSIS — R0981 Nasal congestion: Secondary | ICD-10-CM | POA: Diagnosis not present

## 2019-04-14 DIAGNOSIS — R0602 Shortness of breath: Secondary | ICD-10-CM | POA: Diagnosis not present

## 2019-04-14 DIAGNOSIS — R52 Pain, unspecified: Secondary | ICD-10-CM | POA: Diagnosis not present

## 2019-04-14 MED ORDER — LORAZEPAM 1 MG PO TABS: 1.0000 mg | ORAL_TABLET | Freq: Three times a day (TID) | ORAL | 2 refills | Status: DC | PRN

## 2019-04-14 NOTE — Telephone Encounter (Signed)
Ok to refill??  Last office visit 01/24/2019  Last refill 03/17/2019.

## 2019-04-17 ENCOUNTER — Other Ambulatory Visit: Payer: Self-pay | Admitting: Endocrinology

## 2019-04-17 ENCOUNTER — Other Ambulatory Visit: Payer: Self-pay | Admitting: Family Medicine

## 2019-04-17 DIAGNOSIS — F331 Major depressive disorder, recurrent, moderate: Secondary | ICD-10-CM

## 2019-04-28 ENCOUNTER — Other Ambulatory Visit: Payer: Self-pay

## 2019-04-28 ENCOUNTER — Ambulatory Visit: Payer: Medicaid Other | Admitting: Family Medicine

## 2019-04-28 VITALS — BP 118/68 | HR 78 | Temp 97.7°F | Resp 17 | Wt 169.4 lb

## 2019-04-28 DIAGNOSIS — E1121 Type 2 diabetes mellitus with diabetic nephropathy: Secondary | ICD-10-CM | POA: Diagnosis not present

## 2019-04-28 DIAGNOSIS — G894 Chronic pain syndrome: Secondary | ICD-10-CM | POA: Diagnosis not present

## 2019-04-28 DIAGNOSIS — E782 Mixed hyperlipidemia: Secondary | ICD-10-CM

## 2019-04-28 DIAGNOSIS — E039 Hypothyroidism, unspecified: Secondary | ICD-10-CM

## 2019-04-28 DIAGNOSIS — L659 Nonscarring hair loss, unspecified: Secondary | ICD-10-CM | POA: Diagnosis not present

## 2019-04-28 DIAGNOSIS — F419 Anxiety disorder, unspecified: Secondary | ICD-10-CM

## 2019-04-28 DIAGNOSIS — Z794 Long term (current) use of insulin: Secondary | ICD-10-CM | POA: Diagnosis not present

## 2019-04-28 DIAGNOSIS — J410 Simple chronic bronchitis: Secondary | ICD-10-CM | POA: Diagnosis not present

## 2019-04-28 DIAGNOSIS — N1832 Chronic kidney disease, stage 3b: Secondary | ICD-10-CM | POA: Diagnosis not present

## 2019-04-28 DIAGNOSIS — T2040XA Corrosion of unspecified degree of head, face, and neck, unspecified site, initial encounter: Secondary | ICD-10-CM

## 2019-04-28 DIAGNOSIS — F5101 Primary insomnia: Secondary | ICD-10-CM | POA: Diagnosis not present

## 2019-04-28 DIAGNOSIS — I10 Essential (primary) hypertension: Secondary | ICD-10-CM

## 2019-04-28 DIAGNOSIS — E1122 Type 2 diabetes mellitus with diabetic chronic kidney disease: Secondary | ICD-10-CM

## 2019-04-28 MED ORDER — PROMETHAZINE HCL 25 MG PO TABS: 25.0000 mg | ORAL_TABLET | Freq: Three times a day (TID) | ORAL | 0 refills | Status: DC | PRN

## 2019-04-28 MED ORDER — BUDESONIDE-FORMOTEROL FUMARATE 160-4.5 MCG/ACT IN AERO: 2.0000 | INHALATION_SPRAY | Freq: Two times a day (BID) | RESPIRATORY_TRACT | 3 refills | Status: DC

## 2019-04-28 MED ORDER — ALBUTEROL SULFATE HFA 108 (90 BASE) MCG/ACT IN AERS: 2.0000 | INHALATION_SPRAY | RESPIRATORY_TRACT | 1 refills | Status: DC | PRN

## 2019-04-28 NOTE — Assessment & Plan Note (Signed)
Continue current medications.  Lorazepam as needed.

## 2019-04-28 NOTE — Assessment & Plan Note (Signed)
Levothyroxine 124mcg once a day, recheck TSH

## 2019-04-28 NOTE — Assessment & Plan Note (Signed)
Recheck renal function. ?

## 2019-04-28 NOTE — Assessment & Plan Note (Addendum)
counsled on tobacco cessation Start symbicort BID Albuterol prn
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
## 2019-04-28 NOTE — Assessment & Plan Note (Signed)
Diabetes management per endocrinology, however due to COVID -19 has not had repeat labs Will obtain today and forward to her endocrinologist

## 2019-04-28 NOTE — Assessment & Plan Note (Signed)
Followed by pain clinic.  

## 2019-04-28 NOTE — Assessment & Plan Note (Signed)
Continue prn ambien CR

## 2019-04-28 NOTE — Assessment & Plan Note (Signed)
LDL at goal,. Less than 100 Continue statin drug

## 2019-04-28 NOTE — Assessment & Plan Note (Signed)
Controlled no changes 

## 2019-04-28 NOTE — Patient Instructions (Signed)
Start symbicort  F/U 4 months We will send lab results

## 2019-04-29 LAB — COMPREHENSIVE METABOLIC PANEL
AG Ratio: 1.3 (calc) (ref 1.0–2.5)
ALT: 17 U/L (ref 6–29)
AST: 22 U/L (ref 10–35)
Albumin: 3.6 g/dL (ref 3.6–5.1)
Alkaline phosphatase (APISO): 74 U/L (ref 37–153)
BUN/Creatinine Ratio: 15 (calc) (ref 6–22)
BUN: 22 mg/dL (ref 7–25)
CO2: 20 mmol/L (ref 20–32)
Calcium: 8.5 mg/dL — ABNORMAL LOW (ref 8.6–10.4)
Chloride: 106 mmol/L (ref 98–110)
Creat: 1.48 mg/dL — ABNORMAL HIGH (ref 0.50–1.05)
Globulin: 2.7 g/dL (calc) (ref 1.9–3.7)
Glucose, Bld: 257 mg/dL — ABNORMAL HIGH (ref 65–99)
Potassium: 4.5 mmol/L (ref 3.5–5.3)
Sodium: 135 mmol/L (ref 135–146)
Total Bilirubin: 0.4 mg/dL (ref 0.2–1.2)
Total Protein: 6.3 g/dL (ref 6.1–8.1)

## 2019-04-29 LAB — HEMOGLOBIN A1C
Hgb A1c MFr Bld: 8.1 % of total Hgb — ABNORMAL HIGH (ref <5.7)
Mean Plasma Glucose: 186 (calc)
eAG (mmol/L): 10.3 (calc)

## 2019-04-29 LAB — CBC WITH DIFFERENTIAL/PLATELET
Absolute Monocytes: 835 cells/uL (ref 200–950)
Basophils Absolute: 58 cells/uL (ref 0–200)
Basophils Relative: 0.6 %
Eosinophils Absolute: 269 cells/uL (ref 15–500)
Eosinophils Relative: 2.8 %
HCT: 38.2 % (ref 35.0–45.0)
Hemoglobin: 12.8 g/dL (ref 11.7–15.5)
Lymphs Abs: 3034 cells/uL (ref 850–3900)
MCH: 35.6 pg — ABNORMAL HIGH (ref 27.0–33.0)
MCHC: 33.5 g/dL (ref 32.0–36.0)
MCV: 106.1 fL — ABNORMAL HIGH (ref 80.0–100.0)
MPV: 11 fL (ref 7.5–12.5)
Monocytes Relative: 8.7 %
Neutro Abs: 5405 cells/uL (ref 1500–7800)
Neutrophils Relative %: 56.3 %
Platelets: 194 10*3/uL (ref 140–400)
RBC: 3.6 10*6/uL — ABNORMAL LOW (ref 3.80–5.10)
RDW: 14.8 % (ref 11.0–15.0)
Total Lymphocyte: 31.6 %
WBC: 9.6 10*3/uL (ref 3.8–10.8)

## 2019-04-29 LAB — T3, FREE: T3, Free: 2 pg/mL — ABNORMAL LOW (ref 2.3–4.2)

## 2019-04-29 LAB — TSH: TSH: 12.58 mIU/L — ABNORMAL HIGH

## 2019-05-05 DIAGNOSIS — L259 Unspecified contact dermatitis, unspecified cause: Secondary | ICD-10-CM | POA: Diagnosis not present

## 2019-05-05 DIAGNOSIS — L810 Postinflammatory hyperpigmentation: Secondary | ICD-10-CM | POA: Diagnosis not present

## 2019-05-06 DIAGNOSIS — L810 Postinflammatory hyperpigmentation: Secondary | ICD-10-CM | POA: Diagnosis not present

## 2019-05-06 DIAGNOSIS — L249 Irritant contact dermatitis, unspecified cause: Secondary | ICD-10-CM | POA: Diagnosis not present

## 2019-05-07 ENCOUNTER — Ambulatory Visit: Payer: Medicaid Other | Admitting: "Endocrinology

## 2019-05-08 ENCOUNTER — Other Ambulatory Visit: Payer: Self-pay | Admitting: Family Medicine

## 2019-05-08 ENCOUNTER — Ambulatory Visit: Payer: Medicaid Other | Attending: Internal Medicine

## 2019-05-13 ENCOUNTER — Other Ambulatory Visit: Payer: Self-pay | Admitting: Family Medicine

## 2019-05-13 DIAGNOSIS — Z79891 Long term (current) use of opiate analgesic: Secondary | ICD-10-CM | POA: Diagnosis not present

## 2019-05-13 DIAGNOSIS — E1142 Type 2 diabetes mellitus with diabetic polyneuropathy: Secondary | ICD-10-CM | POA: Diagnosis not present

## 2019-05-13 DIAGNOSIS — Z79899 Other long term (current) drug therapy: Secondary | ICD-10-CM | POA: Diagnosis not present

## 2019-05-13 DIAGNOSIS — J018 Other acute sinusitis: Secondary | ICD-10-CM | POA: Diagnosis not present

## 2019-05-13 NOTE — Telephone Encounter (Signed)
Ok to refill??  Last office visit 04/28/2019.  Last refill 01/27/2019, #2 refills.

## 2019-05-14 ENCOUNTER — Other Ambulatory Visit: Payer: Self-pay | Admitting: *Deleted

## 2019-05-14 NOTE — Telephone Encounter (Signed)
Received fax requesting refill on Ambien.   Ok to refill??  Last office visit 04/28/2019.  Last refill 10/26/220, #2 refills.

## 2019-05-20 ENCOUNTER — Other Ambulatory Visit: Payer: Self-pay | Admitting: Adult Health

## 2019-05-27 ENCOUNTER — Encounter: Payer: Self-pay | Admitting: Family Medicine

## 2019-05-27 ENCOUNTER — Other Ambulatory Visit: Payer: Self-pay | Admitting: Family Medicine

## 2019-05-28 ENCOUNTER — Other Ambulatory Visit: Payer: Self-pay | Admitting: Family Medicine

## 2019-05-28 DIAGNOSIS — N1832 Chronic kidney disease, stage 3b: Secondary | ICD-10-CM

## 2019-05-28 DIAGNOSIS — Z794 Long term (current) use of insulin: Secondary | ICD-10-CM

## 2019-05-28 MED ORDER — OZEMPIC (0.25 OR 0.5 MG/DOSE) 2 MG/1.5ML ~~LOC~~ SOPN: 0.5000 mg | PEN_INJECTOR | SUBCUTANEOUS | 2 refills | Status: DC

## 2019-05-28 NOTE — Telephone Encounter (Signed)
Prescription sent to pharmacy.   OK to change referral?

## 2019-05-29 ENCOUNTER — Telehealth: Payer: Self-pay | Admitting: *Deleted

## 2019-05-29 MED ORDER — OZEMPIC (0.25 OR 0.5 MG/DOSE) 2 MG/1.5ML ~~LOC~~ SOPN: 0.5000 mg | PEN_INJECTOR | SUBCUTANEOUS | 2 refills | Status: DC

## 2019-05-29 NOTE — Telephone Encounter (Signed)
Received request from pharmacy for [place] on [place].  PA submitted.   Dx: E11.65- uncontrolled DM.  Received immediate determination.   PA [id number] approved 05/29/2019- 05/28/2020.  Pharmacy made aware.

## 2019-06-05 ENCOUNTER — Encounter: Payer: Self-pay | Admitting: Family Medicine

## 2019-06-05 MED ORDER — OZEMPIC (0.25 OR 0.5 MG/DOSE) 2 MG/1.5ML ~~LOC~~ SOPN: 0.5000 mg | PEN_INJECTOR | SUBCUTANEOUS | 2 refills | Status: DC

## 2019-06-05 MED ORDER — LANTUS SOLOSTAR 100 UNIT/ML ~~LOC~~ SOPN: 15.0000 [IU] | PEN_INJECTOR | Freq: Every day | SUBCUTANEOUS | 11 refills | Status: DC

## 2019-06-10 ENCOUNTER — Other Ambulatory Visit: Payer: Self-pay | Admitting: Family Medicine

## 2019-06-10 ENCOUNTER — Other Ambulatory Visit: Payer: Self-pay | Admitting: Endocrinology

## 2019-06-10 DIAGNOSIS — E1142 Type 2 diabetes mellitus with diabetic polyneuropathy: Secondary | ICD-10-CM | POA: Diagnosis not present

## 2019-06-10 DIAGNOSIS — Z79899 Other long term (current) drug therapy: Secondary | ICD-10-CM | POA: Diagnosis not present

## 2019-06-10 DIAGNOSIS — L659 Nonscarring hair loss, unspecified: Secondary | ICD-10-CM | POA: Diagnosis not present

## 2019-06-10 DIAGNOSIS — Z79891 Long term (current) use of opiate analgesic: Secondary | ICD-10-CM | POA: Diagnosis not present

## 2019-06-11 ENCOUNTER — Encounter: Payer: Self-pay | Admitting: Family Medicine

## 2019-06-11 ENCOUNTER — Other Ambulatory Visit: Payer: Self-pay | Admitting: Family Medicine

## 2019-06-11 MED ORDER — FREESTYLE LIBRE 14 DAY SENSOR MISC: 1.0000 | 3 refills | Status: DC

## 2019-06-11 MED ORDER — PROMETHAZINE HCL 25 MG PO TABS: 25.0000 mg | ORAL_TABLET | Freq: Three times a day (TID) | ORAL | 0 refills | Status: DC | PRN

## 2019-06-11 NOTE — Telephone Encounter (Signed)
Ok to refill??  Last office visit 04/28/2019.  Last refill 04/14/2019, #2 refills.

## 2019-06-12 ENCOUNTER — Other Ambulatory Visit: Payer: Self-pay

## 2019-06-12 DIAGNOSIS — Z23 Encounter for immunization: Secondary | ICD-10-CM | POA: Diagnosis not present

## 2019-06-12 MED ORDER — FREESTYLE LIBRE 14 DAY SENSOR MISC: 1.0000 | 0 refills | Status: DC

## 2019-06-17 ENCOUNTER — Encounter: Payer: Self-pay | Admitting: Family Medicine

## 2019-06-24 ENCOUNTER — Other Ambulatory Visit: Payer: Self-pay | Admitting: Family Medicine

## 2019-07-08 DIAGNOSIS — L659 Nonscarring hair loss, unspecified: Secondary | ICD-10-CM | POA: Diagnosis not present

## 2019-07-08 DIAGNOSIS — B001 Herpesviral vesicular dermatitis: Secondary | ICD-10-CM | POA: Diagnosis not present

## 2019-07-08 DIAGNOSIS — L810 Postinflammatory hyperpigmentation: Secondary | ICD-10-CM | POA: Diagnosis not present

## 2019-07-09 ENCOUNTER — Other Ambulatory Visit: Payer: Self-pay | Admitting: Family Medicine

## 2019-07-11 DIAGNOSIS — Z79899 Other long term (current) drug therapy: Secondary | ICD-10-CM | POA: Diagnosis not present

## 2019-07-11 DIAGNOSIS — Z79891 Long term (current) use of opiate analgesic: Secondary | ICD-10-CM | POA: Diagnosis not present

## 2019-07-11 DIAGNOSIS — E1142 Type 2 diabetes mellitus with diabetic polyneuropathy: Secondary | ICD-10-CM | POA: Diagnosis not present

## 2019-07-11 DIAGNOSIS — M545 Low back pain: Secondary | ICD-10-CM | POA: Diagnosis not present

## 2019-07-12 ENCOUNTER — Other Ambulatory Visit: Payer: Self-pay | Admitting: Family Medicine

## 2019-07-12 DIAGNOSIS — Z23 Encounter for immunization: Secondary | ICD-10-CM | POA: Diagnosis not present

## 2019-07-14 ENCOUNTER — Telehealth: Payer: Self-pay | Admitting: *Deleted

## 2019-07-14 NOTE — Telephone Encounter (Signed)
Ok to refill Ambien??  Last office visit 04/28/2019.  Last refill 05/14/2019, #2 refills.

## 2019-07-14 NOTE — Telephone Encounter (Signed)
Received request from pharmacy for PA on Ambien CR.  PA submitted via  Tracks.   Confirmation: [id number].  Dx: [email or web address]

## 2019-07-15 MED ORDER — ZOLPIDEM TARTRATE ER 12.5 MG PO TBCR: EXTENDED_RELEASE_TABLET | ORAL | 2 refills | Status: DC

## 2019-07-15 NOTE — Telephone Encounter (Signed)
Received PA determination.   PA [id number] approved 07/14/2019- 01/10/2020.  Please re-submit to notify pharmacy.

## 2019-07-27 ENCOUNTER — Other Ambulatory Visit: Payer: Self-pay | Admitting: Family Medicine

## 2019-07-27 DIAGNOSIS — F331 Major depressive disorder, recurrent, moderate: Secondary | ICD-10-CM

## 2019-08-04 ENCOUNTER — Encounter: Payer: Self-pay | Admitting: Dermatology

## 2019-08-04 ENCOUNTER — Other Ambulatory Visit: Payer: Self-pay

## 2019-08-04 ENCOUNTER — Ambulatory Visit: Payer: Medicaid Other | Admitting: Dermatology

## 2019-08-04 DIAGNOSIS — L739 Follicular disorder, unspecified: Secondary | ICD-10-CM

## 2019-08-04 DIAGNOSIS — L309 Dermatitis, unspecified: Secondary | ICD-10-CM

## 2019-08-04 NOTE — Patient Instructions (Signed)

## 2019-08-04 NOTE — Progress Notes (Signed)
   Follow-Up Visit   Subjective  Kathryn Oconnell is a 54 y.o. female who presents for the following: Follow-up.  Patient here today for 3 month follow up for Allergic vs Irritatant Dermatitis with PIPA. She is using mometasone solution every other day and is using kojic acid soap as well as Differin 0.1% gel. She has noticed improvement.   The following portions of the chart were reviewed this encounter and updated as appropriate:  Tobacco  Allergies  Meds  Problems  Med Hx  Surg Hx  Fam Hx     Review of Systems:  No other skin or systemic complaints except as noted in HPI or Assessment and Plan.  Objective  Well appearing patient in no apparent distress; mood and affect are within normal limits.  A focused examination was performed including face, right elbow. Relevant physical exam findings are noted in the Assessment and Plan.  Objective  face, elbows: Hyperpigmentation 9.0 x 3.0cm at right elbow At least 5 crusted erosions on face each 0.5-1.0cm with 60mm depth.  Some new areas at face, some completely resolved.   Images              Objective  Scalp: History of traction folliculitis, resolved.    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 Patient advised to discontinue kojic acid soap  Start Duoderm to elbow, aa face, once weekly.  Recommend gentle skin care.  Cont mometasone lotion QD-BID 5 days a week x 3 months to dark areas or sores.  Avoid picking or scraping. Advised this condition will take a long time to  improve as the color will need to fade gradually. If she continues to scratch or pick or scrape her face, or use irritating substances, her condition will persist and worsen.  Folliculitis Scalp  Benign, observe. We will reevaluate in the future   Return in about 3 months (around 11/04/2019) for dermatitis with PIPA.  Graciella Belton, RMA, am acting as scribe for Sarina Ser, MD .  Documentation: I have reviewed the above documentation for accuracy and completeness, and I agree with the above.  Sarina Ser, MD

## 2019-08-11 DIAGNOSIS — E1142 Type 2 diabetes mellitus with diabetic polyneuropathy: Secondary | ICD-10-CM | POA: Diagnosis not present

## 2019-08-11 DIAGNOSIS — E1122 Type 2 diabetes mellitus with diabetic chronic kidney disease: Secondary | ICD-10-CM | POA: Diagnosis not present

## 2019-08-11 DIAGNOSIS — Z79891 Long term (current) use of opiate analgesic: Secondary | ICD-10-CM | POA: Diagnosis not present

## 2019-08-11 DIAGNOSIS — Z79899 Other long term (current) drug therapy: Secondary | ICD-10-CM | POA: Diagnosis not present

## 2019-08-29 ENCOUNTER — Other Ambulatory Visit: Payer: Self-pay | Admitting: Family Medicine

## 2019-08-29 ENCOUNTER — Ambulatory Visit: Payer: Medicaid Other | Admitting: Family Medicine

## 2019-08-29 DIAGNOSIS — F331 Major depressive disorder, recurrent, moderate: Secondary | ICD-10-CM

## 2019-09-04 ENCOUNTER — Other Ambulatory Visit: Payer: Self-pay | Admitting: Family Medicine

## 2019-09-04 DIAGNOSIS — F331 Major depressive disorder, recurrent, moderate: Secondary | ICD-10-CM

## 2019-09-12 ENCOUNTER — Encounter (HOSPITAL_COMMUNITY): Payer: Self-pay

## 2019-09-12 ENCOUNTER — Other Ambulatory Visit: Payer: Self-pay

## 2019-09-12 DIAGNOSIS — I129 Hypertensive chronic kidney disease with stage 1 through stage 4 chronic kidney disease, or unspecified chronic kidney disease: Secondary | ICD-10-CM | POA: Insufficient documentation

## 2019-09-12 DIAGNOSIS — E1122 Type 2 diabetes mellitus with diabetic chronic kidney disease: Secondary | ICD-10-CM | POA: Insufficient documentation

## 2019-09-12 DIAGNOSIS — E785 Hyperlipidemia, unspecified: Secondary | ICD-10-CM | POA: Insufficient documentation

## 2019-09-12 DIAGNOSIS — N183 Chronic kidney disease, stage 3 unspecified: Secondary | ICD-10-CM | POA: Diagnosis not present

## 2019-09-12 DIAGNOSIS — Z794 Long term (current) use of insulin: Secondary | ICD-10-CM | POA: Insufficient documentation

## 2019-09-12 DIAGNOSIS — Z79899 Other long term (current) drug therapy: Secondary | ICD-10-CM | POA: Insufficient documentation

## 2019-09-12 DIAGNOSIS — E039 Hypothyroidism, unspecified: Secondary | ICD-10-CM | POA: Diagnosis not present

## 2019-09-12 DIAGNOSIS — M545 Low back pain: Secondary | ICD-10-CM | POA: Insufficient documentation

## 2019-09-12 NOTE — ED Triage Notes (Addendum)
Pt comes in from home with lower back pain since this AM. Denies any known injury. Denies numbness or tingling. Ambulatory. Took an oxycodone around 4pm said it helped but hasnt had anything since then

## 2019-09-13 ENCOUNTER — Emergency Department (HOSPITAL_COMMUNITY): Payer: Medicaid Other

## 2019-09-13 ENCOUNTER — Emergency Department (HOSPITAL_COMMUNITY)
Admission: EM | Admit: 2019-09-13 | Discharge: 2019-09-13 | Discharge status: Against Medical Advice | Payer: Medicaid Other | Attending: Emergency Medicine | Admitting: Emergency Medicine

## 2019-09-13 DIAGNOSIS — M545 Low back pain, unspecified: Secondary | ICD-10-CM

## 2019-09-13 LAB — URINALYSIS, ROUTINE W REFLEX MICROSCOPIC
Bilirubin Urine: NEGATIVE
Glucose, UA: NEGATIVE mg/dL
Hgb urine dipstick: NEGATIVE
Ketones, ur: NEGATIVE mg/dL
Leukocytes,Ua: NEGATIVE
Nitrite: NEGATIVE
Protein, ur: NEGATIVE mg/dL
Specific Gravity, Urine: 1.008 (ref 1.005–1.030)
pH: 7 (ref 5.0–8.0)

## 2019-09-13 LAB — CBG MONITORING, ED: Glucose-Capillary: 275 mg/dL — ABNORMAL HIGH (ref 70–99)

## 2019-09-13 NOTE — ED Provider Notes (Signed)
[facility] EMERGENCY DEPARTMENT Provider Note   CSN: [id number] Arrival date & time: 09/12/19  2229     History Chief Complaint  Patient presents with  . Back Pain    lower     Kathryn Oconnell is a 54 y.o. female.  Patient with history of COPD, diabetes, hypertension, hypothyroidism presenting with lower back pain onset this morning.  States it was not there when she went to bed but she slept poorly no pain was there when she woke up.  The pain is across her entire low back worse in the left side.  It does not radiate down her legs.  There is no associated weakness, numbness or tingling.  No pain with urination or blood in the urine.  No bowel or bladder incontinence.  No fever or vomiting.  No chest pain or shortness of breath.  Does not have any history of chronic back problems.  Does take oxycodone chronically for neuropathy which she is taking for her back as well.  She has never had back surgery.  No history of IV drug abuse or cancer. States she has had several falls in the past week which she believes is from her medications.  Denies any dizziness or lightheadedness currently.   Back Pain Associated symptoms: no abdominal pain, no chest pain, no dysuria, no fever, no headaches and no weakness        Past Medical History:  Diagnosis Date  . Anxiety   . Chronic kidney disease   . COPD (chronic obstructive pulmonary disease) ([place])   . Depression   . Diabetes mellitus without complication ([place])   . Eczema   . Graves disease   . HSV-2 infection   . Hyperlipidemia   . Hypertension   . Hypothyroid   . Lung nodule   . Menopausal vaginal dryness 02/24/2014  . Migraines   . Neuropathy   . Tobacco use   . Trouble in sleeping 06/25/2015    Patient Active Problem List   Diagnosis Date Noted  . 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 Tachycardia 06/19/2016  . Chronic pain syndrome 07/28/2014  . Vaginal dryness, menopausal 02/24/2014  . Essential hypertension, benign  03/09/2013  . Peripheral neuropathy 10/02/2012  . Chronic constipation 07/31/2012  . Seasonal allergies 07/31/2012  . Genital herpes 06/15/2012  . Migraines 06/09/2012  . Depression 06/09/2012  . Mixed hyperlipidemia 03/23/2012  . GERD (gastroesophageal reflux disease) 02/05/2012  . CKD (chronic kidney disease) stage 3, GFR 30-59 ml/min 02/05/2012  . Insomnia 02/05/2012  . Anxiety 02/05/2012  . Lung nodule 01/22/2012  . COPD (chronic obstructive pulmonary disease) ([place]) 01/22/2012  . Dyspepsia 01/22/2012  . Chronic back pain 07/04/2009  . Hypothyroidism 06/28/2007  . Diabetes mellitus ([place]) 06/28/2007  . Tobacco use 06/28/2007    Past Surgical History:  Procedure Laterality Date  . CESAREAN SECTION     x 2  . FOOT SURGERY     5th digit left foot / Great toe, 2ND TOE  . THYROID SURGERY       OB History    Gravida  6   Para  2   Term      Preterm      AB  4   Living  2     SAB  4   TAB      Ectopic      Multiple      Live Births  1           Family History  Problem Relation Age of Onset  . Stroke Mother        x 3  . Cancer Mother        breast   . Thyroid disease Mother   . Hyperlipidemia Mother   . Cancer Father        stomach cancer   . Hypertension Father   . Heart attack Father 19  . Gout Brother   . Migraines Son   . Hypertension Paternal Uncle   . Heart disease Maternal Grandmother   . Heart attack Maternal Grandmother   . Heart disease Maternal Grandfather   . Heart attack Maternal Grandfather   . Cancer Paternal Grandmother   . Diabetes Paternal Grandfather   . Asthma Daughter     Social History   Tobacco Use  . Smoking status: Current Every Day Smoker    Packs/day: 0.50    Years: 35.00    Pack years: 17.50    Types: Cigarettes  . Smokeless tobacco: Never Used  Vaping Use  . Vaping Use: Never used  Substance Use Topics  . Alcohol use: No  . Drug use: No    Home Medications Prior to Admission medications    Medication Sig Start Date End Date Taking? Authorizing Provider  albuterol (VENTOLIN HFA) 108 (90 Base) MCG/ACT inhaler Inhale 2 puffs into the lungs every 4 (four) hours as needed for wheezing or shortness of breath. 04/28/19   Alycia Rossetti, MD  aspirin 81 MG tablet Take 81 mg by mouth daily.    [provider]  atorvastatin (LIPITOR) 40 MG tablet Take 1 tablet by mouth once daily 09/05/19   Alycia Rossetti, MD  Azelastine-Fluticasone 137-50 MCG/ACT SUSP Place into the nose.    [provider]  BD PEN NEEDLE MICRO U/F 32G X 6 MM MISC USE AS DIRECTED 07/14/19   Alycia Rossetti, MD  budesonide-formoterol [facility]) 160-4.5 MCG/ACT inhaler Inhale 2 puffs into the lungs 2 (two) times daily. 04/28/19   Alycia Rossetti, MD  chlorhexidine (PERIDEX) 0.12 % solution USE AS DIRECTED 15 MLS IN THE MOUTH OR THROAT TWICE DAILY 02/15/17   Alycia Rossetti, MD  Continuous Blood Gluc Sensor (FREESTYLE LIBRE 14 DAY SENSOR) MISC 1 each by Other route every 14 (fourteen) days. 06/12/19   Cassandria Anger, MD  cyclobenzaprine (FLEXERIL) 10 MG tablet Take 1 tablet (10 mg total) by mouth 3 (three) times daily as needed. 08/07/18   [place], Modena Nunnery, MD  glucose blood test strip 1 each by Other route 4 (four) times daily. Use as instructed to check blood sugar four times daily.    [provider]  insulin aspart (NOVOLOG) 100 UNIT/ML FlexPen Inject 5-11 Units into the skin 3 (three) times daily before meals. Use up to 50U QD SQ per insulin pump 02/17/19   Nida, Marella Chimes, MD  Insulin Degludec (TRESIBA FLEXTOUCH [place]) Inject 15 Units into the skin at bedtime.    [provider]  Insulin Disposable Pump (OMNIPOD DASH 5 PACK PODS) MISC 1 each by Does not apply route every 3 (three) days. APPLY 1 OMNIPOD TO BODY ONCE EVERY 3 DAYS FOR INSULIN DELIVERY. 01/24/19   [place], Modena Nunnery, MD  insulin glargine (LANTUS SOLOSTAR) 100 UNIT/ML Solostar Pen Inject 15 Units into the skin  daily. 06/05/19   Alycia Rossetti, MD  levothyroxine (SYNTHROID) 100 MCG tablet Take 1 tablet (100 mcg total) by mouth daily before breakfast. 02/17/19   Nida, Marella Chimes, MD  LORazepam (  ATIVAN) 1 MG tablet TAKE 1 TABLET BY MOUTH THREE TIMES DAILY AS NEEDED FOR ANXIETY 06/11/19   Alycia Rossetti, MD  medroxyPROGESTERone (PROVERA) 2.5 MG tablet Take 1 tablet by mouth once daily 05/21/19   Estill Dooms, NP  metoprolol tartrate (LOPRESSOR) 25 MG tablet Take 1 tablet (25 mg total) by mouth 2 (two) times daily. 01/23/19   Alycia Rossetti, MD  mupirocin ointment (BACTROBAN) 2 % Place 1 application into the nose 2 (two) times daily. 01/29/19   [place], Modena Nunnery, MD  oxyCODONE (ROXICODONE) 15 MG immediate release tablet Take 1 tablet (15 mg total) by mouth every 4 (four) hours as needed for pain. 01/24/19   [place], Modena Nunnery, MD  promethazine (PHENERGAN) 25 MG tablet TAKE 1 TABLET BY MOUTH EVERY 8 HOURS AS NEEDED FOR NAUSEA OR VOMITING 07/09/19   Alycia Rossetti, MD  Semaglutide,0.25 or 0.5MG /DOS, (OZEMPIC, 0.25 OR 0.5 MG/DOSE,) 2 MG/1.5ML SOPN Inject 0.5 mg into the skin once a week. 06/05/19   Alycia Rossetti, MD  sertraline (ZOLOFT) 100 MG tablet Take 1 tablet by mouth once daily 09/05/19   Alycia Rossetti, MD  valACYclovir (VALTREX) 1000 MG tablet Take 1 tablet (1,000 mg total) by mouth daily as needed. 04/22/18   Alycia Rossetti, MD  zolpidem (AMBIEN CR) 12.5 MG CR tablet TAKE 1 TABLET BY MOUTH ONCE DAILY AT BEDTIME AS NEEDED 07/15/19   Alycia Rossetti, MD    Allergies    Erythromycin, Imitrex [sumatriptan], Sulfa antibiotics, and Topamax [topiramate]  Review of Systems   Review of Systems  Constitutional: Negative for activity change, appetite change and fever.  HENT: Negative for congestion and rhinorrhea.   Respiratory: Negative for cough, chest tightness and shortness of breath.   Cardiovascular: Negative for chest pain.  Gastrointestinal: Negative for abdominal pain, nausea  and vomiting.  Genitourinary: Negative for dysuria and frequency.  Musculoskeletal: Positive for arthralgias, back pain and myalgias.  Skin: Negative for rash.  Neurological: Negative for dizziness, weakness and headaches.   all other systems are negative except as noted in the HPI and PMH.    Physical Exam Updated Vital Signs BP 104/75 (BP Location: Right Arm)   Pulse 75   Temp 97.9 F (36.6 C) (Oral)   Resp 18   Ht 5\' 4"  (1.626 m)   Wt 77.1 kg   LMP 06/18/2013 Comment: irregular  SpO2 97%   BMI 29.18 kg/m   Physical Exam Vitals and nursing note reviewed.  Constitutional:      General: She is not in acute distress.    Appearance: She is well-developed.  HENT:     Head: Normocephalic and atraumatic.     Mouth/Throat:     Pharynx: No oropharyngeal exudate.  Eyes:     Conjunctiva/sclera: Conjunctivae normal.     Pupils: Pupils are equal, round, and reactive to light.  Neck:     Comments: No meningismus. Cardiovascular:     Rate and Rhythm: Normal rate and regular rhythm.     Heart sounds: Normal heart sounds. No murmur heard.   Pulmonary:     Effort: Pulmonary effort is normal. No respiratory distress.     Breath sounds: Normal breath sounds.  Abdominal:     Palpations: Abdomen is soft.     Tenderness: There is no abdominal tenderness. There is no guarding or rebound.  Musculoskeletal:        General: No tenderness. Normal range of motion.     Cervical back: Normal  range of motion and neck supple.     Comments: Paraspinal lumbar tenderness bilaterally, no midline tenderness  5/5 strength in bilateral lower extremities. Ankle plantar and dorsiflexion intact. Great toe extension intact bilaterally. +2 DP and PT pulses. +2 patellar reflexes bilaterally. Normal gait.   Skin:    General: Skin is warm.  Neurological:     Mental Status: She is alert and oriented to person, place, and time.     Cranial Nerves: No cranial nerve deficit.     Motor: No abnormal muscle  tone.     Coordination: Coordination normal.     Comments:  5/5 strength throughout. CN 2-12 intact.Equal grip strength.   Psychiatric:        Behavior: Behavior normal.     ED Results / Procedures / Treatments   Labs (all labs ordered are listed, but only abnormal results are displayed) Labs Reviewed  CBG MONITORING, ED - Abnormal; Notable for the following components:      Result Value   Glucose-Capillary 275 (*)    All other components within normal limits  URINALYSIS, ROUTINE W REFLEX MICROSCOPIC    EKG None  Radiology No results found.  Procedures Procedures (including critical care time)  Medications Ordered in ED Medications - No data to display  ED Course  I have reviewed the triage vital signs and the nursing notes.  Pertinent labs & imaging results that were available during my care of the patient were reviewed by me and considered in my medical decision making (see chart for details).    MDM Rules/Calculators/A&P                         Low back pain without weakness, numbness or tingling.  Intact distal strength, sensation, pulses and reflexes.  Low concern for cord compression or cauda equina.  CBG is 275.  Urinalysis pending to evaluate for hematuria or infection.  Also obtain CT to evaluate for obstructive uropathy and rule out aortic aneurysm.  Patient told nursing staff that she was leaving before the studies were completed.  She walked out of the ED under her own power.  I did not have a chance to speak with her before she left. Urinalysis and CT scan were not completed. Final Clinical Impression(s) / ED Diagnoses Final diagnoses:  Acute bilateral low back pain without sciatica    Rx / DC Orders ED Discharge Orders    None       Abbygail [place], Annie Main, MD 09/13/19 [phone]

## 2019-09-17 DIAGNOSIS — E1142 Type 2 diabetes mellitus with diabetic polyneuropathy: Secondary | ICD-10-CM | POA: Diagnosis not present

## 2019-09-17 DIAGNOSIS — Z79899 Other long term (current) drug therapy: Secondary | ICD-10-CM | POA: Diagnosis not present

## 2019-09-17 DIAGNOSIS — Z79891 Long term (current) use of opiate analgesic: Secondary | ICD-10-CM | POA: Diagnosis not present

## 2019-09-17 DIAGNOSIS — M545 Low back pain: Secondary | ICD-10-CM | POA: Diagnosis not present

## 2019-09-24 ENCOUNTER — Other Ambulatory Visit: Payer: Self-pay | Admitting: Adult Health

## 2019-10-08 ENCOUNTER — Encounter: Payer: Self-pay | Admitting: Family Medicine

## 2019-10-08 ENCOUNTER — Ambulatory Visit (INDEPENDENT_AMBULATORY_CARE_PROVIDER_SITE_OTHER): Payer: Medicaid Other | Admitting: Family Medicine

## 2019-10-08 ENCOUNTER — Other Ambulatory Visit: Payer: Self-pay

## 2019-10-08 VITALS — BP 112/68 | HR 84 | Temp 98.2°F | Resp 12 | Ht 64.0 in | Wt 168.0 lb

## 2019-10-08 DIAGNOSIS — E1122 Type 2 diabetes mellitus with diabetic chronic kidney disease: Secondary | ICD-10-CM

## 2019-10-08 DIAGNOSIS — M545 Low back pain, unspecified: Secondary | ICD-10-CM

## 2019-10-08 DIAGNOSIS — N1832 Chronic kidney disease, stage 3b: Secondary | ICD-10-CM | POA: Diagnosis not present

## 2019-10-08 DIAGNOSIS — S99921A Unspecified injury of right foot, initial encounter: Secondary | ICD-10-CM | POA: Diagnosis not present

## 2019-10-08 DIAGNOSIS — E782 Mixed hyperlipidemia: Secondary | ICD-10-CM

## 2019-10-08 DIAGNOSIS — S99921D Unspecified injury of right foot, subsequent encounter: Secondary | ICD-10-CM

## 2019-10-08 DIAGNOSIS — G8929 Other chronic pain: Secondary | ICD-10-CM

## 2019-10-08 DIAGNOSIS — E1121 Type 2 diabetes mellitus with diabetic nephropathy: Secondary | ICD-10-CM | POA: Diagnosis not present

## 2019-10-08 DIAGNOSIS — E039 Hypothyroidism, unspecified: Secondary | ICD-10-CM

## 2019-10-08 DIAGNOSIS — Z720 Tobacco use: Secondary | ICD-10-CM

## 2019-10-08 DIAGNOSIS — J302 Other seasonal allergic rhinitis: Secondary | ICD-10-CM

## 2019-10-08 DIAGNOSIS — Z794 Long term (current) use of insulin: Secondary | ICD-10-CM

## 2019-10-08 DIAGNOSIS — I10 Essential (primary) hypertension: Secondary | ICD-10-CM | POA: Diagnosis not present

## 2019-10-08 MED ORDER — CYCLOBENZAPRINE HCL 10 MG PO TABS: 10.0000 mg | ORAL_TABLET | Freq: Three times a day (TID) | ORAL | 0 refills | Status: DC | PRN

## 2019-10-08 NOTE — Assessment & Plan Note (Signed)
Recheck renal function. ?

## 2019-10-08 NOTE — Assessment & Plan Note (Signed)
Continue replacement 

## 2019-10-08 NOTE — Patient Instructions (Addendum)
Referral to endocrinology  Xray of the foot and xray of spine  Add flonase/ or nasocort  Continue zyrtec  Take lantus  Every night , reduce your novolog if needed especially if you dont eat anything  F/U 3 months for physical

## 2019-10-08 NOTE — Assessment & Plan Note (Signed)
Continue zyrtec, add nasal steroid

## 2019-10-08 NOTE — Assessment & Plan Note (Signed)
Controlled no changes 

## 2019-10-08 NOTE — Assessment & Plan Note (Signed)
DM uncontrolled Needs NEW endocrinologist using insulin pump,  She is not compliant with daily lantus, recommend she take this daily, that way decrease short actiing insulin especially when she skips meals, to prevent hypoglycemia episodes Continue ozempic  Statin drug

## 2019-10-08 NOTE — Progress Notes (Signed)
Subjective:    Patient ID: Kathryn Oconnell, female    DOB: Jul 24, 1965, 54 y.o.   MRN: [id number]  Patient presents for Toe Ulcer (x2 weeks- R 2cd toe- open area to top of toe- using abtx cream) and Seasonal Allergies (runny nose and watery eyes)   DM- she has not seen endocrinology , she is using omnipod    States she had had highes up to 400-500, but then recently low sugars down to 50's   she has been ging back in and changing the dosing of the novolog delivered   she has been trying to cut down on carbs  Doesn't take lantus regulary only if its "high" that day She is taking ozemic0.5  mg once a week    Allergies-  Recently started zyrtec, has runny eyes and watery nose, no cough, no facial pain  HTN - she has missed her BP pills at times in the evenin if she falls asleep   Hypothyroidism-  Taking levothyroxine first thing in AM   She is being followed by dermatology for the sarring on    She dropped flower pot on her right foot 2nd toe about 1 week ago, open sore with swelling , no drainage From the ulcerated spot   the toes around it are swollen too  Chronic insomnia- taking ambien as needed but still doesn't sleep well  also takes ativan 1mg  TID and on chronic oxycodone    Note also seen in ER for back pain, but states never evaluated as she fell asleep at the ER, revewed  ER note, she was supposed to have CT/UA down she left before test run  Had pain running across lower back, no sciatica symptoms  Continues to smoke     Review Of Systems:  GEN- denies fatigue, fever, weight loss,weakness, recent illness HEENT- denies eye drainage, change in vision, +nasal discharge, CVS- denies chest pain, palpitations RESP- denies SOB, cough, wheeze ABD- denies N/V, change in stools, abd pain GU- denies dysuria, hematuria, dribbling, incontinence MSK- + joint pain, muscle aches, injury Neuro- denies headache, dizziness, syncope, seizure activity       Objective:    BP 112/68    Pulse 84   Temp 98.2 F (36.8 C) (Temporal)   Resp 12   Ht 5\' 4"  (1.626 m)   Wt 168 lb (76.2 kg)   LMP 06/18/2013 Comment: irregular  SpO2 97%   BMI 28.84 kg/m  GEN- NAD, alert and oriented x3 HEENT- PERRL, EOMI, non injected sclera, pink conjunctiva, MMM, oropharynx clear Neck- Supple, no thyromegaly CVS- RRR, no murmur RESP-CTAB ABD-NABS,soft,NT,ND EXT- Right foot- corns on 2-5th digit, 2nd digit with small ulceration over PIP, swelling of 2-4th toes, no erythema, no drainage, Pulses- Radial 2+, DP- diminished but palpated         Assessment & Plan:      Problem List Items Addressed This Visit      Unprioritized   Chronic back pain - Primary   Relevant Medications   cyclobenzaprine (FLEXERIL) 10 MG tablet   Other Relevant Orders   DG Lumbar Spine Complete   CKD (chronic kidney disease) stage 3, GFR 30-59 ml/min    Recheck renal function       Relevant Orders   COMPLETE METABOLIC PANEL WITH GFR   Essential hypertension, benign    Controlled no changes       Relevant Orders   CBC with Differential/Platelet   Hypothyroidism    Continue replacement  Relevant Medications   levothyroxine (SYNTHROID) 88 MCG tablet   Other Relevant Orders   TSH   T3, free   Ambulatory referral to Endocrinology   Mixed hyperlipidemia   Relevant Orders   Lipid panel   Seasonal allergies    Continue zyrtec, add nasal steroid       Tobacco use   Type 2 diabetes mellitus with diabetic chronic kidney disease ([place])    DM uncontrolled Needs NEW endocrinologist using insulin pump,  She is not compliant with daily lantus, recommend she take this daily, that way decrease short actiing insulin especially when she skips meals, to prevent hypoglycemia episodes Continue ozempic  Statin drug       Relevant Orders   Hemoglobin A1c   Lipid panel   Ambulatory referral to Endocrinology    Other Visit Diagnoses    Injury of right foot, subsequent encounter       Dropped item  on toes wtih persistant swelling ulceration over previous corn 2nd toe, no sign of infection, obtain xray   Relevant Orders   DG Foot Complete Right      Note: This dictation was prepared with Dragon dictation along with smaller phrase technology. Any transcriptional errors that result from this process are unintentional.

## 2019-10-09 ENCOUNTER — Other Ambulatory Visit: Payer: Self-pay | Admitting: Family Medicine

## 2019-10-09 LAB — CBC WITH DIFFERENTIAL/PLATELET
Absolute Monocytes: 792 cells/uL (ref 200–950)
Basophils Absolute: 36 cells/uL (ref 0–200)
Basophils Relative: 0.4 %
Eosinophils Absolute: 214 cells/uL (ref 15–500)
Eosinophils Relative: 2.4 %
HCT: 37.4 % (ref 35.0–45.0)
Hemoglobin: 12.9 g/dL (ref 11.7–15.5)
Lymphs Abs: 2866 cells/uL (ref 850–3900)
MCH: 38.5 pg — ABNORMAL HIGH (ref 27.0–33.0)
MCHC: 34.5 g/dL (ref 32.0–36.0)
MCV: 111.6 fL — ABNORMAL HIGH (ref 80.0–100.0)
MPV: 11.5 fL (ref 7.5–12.5)
Monocytes Relative: 8.9 %
Neutro Abs: 4993 cells/uL (ref 1500–7800)
Neutrophils Relative %: 56.1 %
Platelets: 233 10*3/uL (ref 140–400)
RBC: 3.35 10*6/uL — ABNORMAL LOW (ref 3.80–5.10)
RDW: 15.9 % — ABNORMAL HIGH (ref 11.0–15.0)
Total Lymphocyte: 32.2 %
WBC: 8.9 10*3/uL (ref 3.8–10.8)

## 2019-10-09 LAB — HEMOGLOBIN A1C
Hgb A1c MFr Bld: 8.7 % of total Hgb — ABNORMAL HIGH (ref <5.7)
Mean Plasma Glucose: 203 (calc)
eAG (mmol/L): 11.2 (calc)

## 2019-10-09 LAB — COMPLETE METABOLIC PANEL WITH GFR
AG Ratio: 1.4 (calc) (ref 1.0–2.5)
ALT: 18 U/L (ref 6–29)
AST: 28 U/L (ref 10–35)
Albumin: 3.9 g/dL (ref 3.6–5.1)
Alkaline phosphatase (APISO): 69 U/L (ref 37–153)
BUN/Creatinine Ratio: 19 (calc) (ref 6–22)
BUN: 27 mg/dL — ABNORMAL HIGH (ref 7–25)
CO2: 18 mmol/L — ABNORMAL LOW (ref 20–32)
Calcium: 9.1 mg/dL (ref 8.6–10.4)
Chloride: 110 mmol/L (ref 98–110)
Creat: 1.45 mg/dL — ABNORMAL HIGH (ref 0.50–1.05)
GFR, Est African American: 47 mL/min/{1.73_m2} — ABNORMAL LOW (ref >=60)
GFR, Est Non African American: 41 mL/min/{1.73_m2} — ABNORMAL LOW (ref >=60)
Globulin: 2.7 g/dL (calc) (ref 1.9–3.7)
Glucose, Bld: 62 mg/dL — ABNORMAL LOW (ref 65–99)
Potassium: 4.9 mmol/L (ref 3.5–5.3)
Sodium: 138 mmol/L (ref 135–146)
Total Bilirubin: 0.7 mg/dL (ref 0.2–1.2)
Total Protein: 6.6 g/dL (ref 6.1–8.1)

## 2019-10-09 LAB — LIPID PANEL
Cholesterol: 172 mg/dL (ref <200)
HDL: 39 mg/dL — ABNORMAL LOW (ref >=50)
LDL Cholesterol (Calc): 110 mg/dL (calc) — ABNORMAL HIGH
Non-HDL Cholesterol (Calc): 133 mg/dL (calc) — ABNORMAL HIGH (ref <130)
Total CHOL/HDL Ratio: 4.4 (calc) (ref <5.0)
Triglycerides: 122 mg/dL (ref <150)

## 2019-10-09 LAB — T3, FREE: T3, Free: 2.2 pg/mL — ABNORMAL LOW (ref 2.3–4.2)

## 2019-10-09 LAB — TSH: TSH: 3.11 mIU/L

## 2019-10-15 DIAGNOSIS — Z79899 Other long term (current) drug therapy: Secondary | ICD-10-CM | POA: Diagnosis not present

## 2019-10-15 DIAGNOSIS — N183 Chronic kidney disease, stage 3 unspecified: Secondary | ICD-10-CM | POA: Diagnosis not present

## 2019-10-15 DIAGNOSIS — M545 Low back pain: Secondary | ICD-10-CM | POA: Diagnosis not present

## 2019-10-15 DIAGNOSIS — G8929 Other chronic pain: Secondary | ICD-10-CM | POA: Diagnosis not present

## 2019-10-16 ENCOUNTER — Other Ambulatory Visit: Payer: Self-pay | Admitting: Family Medicine

## 2019-10-17 NOTE — Telephone Encounter (Signed)
Ok to refill??  Last office visit 10/08/2019.  Last refill 06/11/2019.

## 2019-10-22 ENCOUNTER — Other Ambulatory Visit: Payer: Self-pay | Admitting: *Deleted

## 2019-10-22 MED ORDER — IPRATROPIUM BROMIDE 0.06 % NA SOLN: 2.0000 | Freq: Three times a day (TID) | NASAL | 0 refills | Status: DC | PRN

## 2019-10-31 ENCOUNTER — Other Ambulatory Visit: Payer: Self-pay | Admitting: Dermatology

## 2019-11-13 ENCOUNTER — Other Ambulatory Visit: Payer: Self-pay | Admitting: Family Medicine

## 2019-11-13 ENCOUNTER — Other Ambulatory Visit: Payer: Self-pay | Admitting: "Endocrinology

## 2019-11-13 ENCOUNTER — Other Ambulatory Visit: Payer: Self-pay

## 2019-11-13 DIAGNOSIS — F331 Major depressive disorder, recurrent, moderate: Secondary | ICD-10-CM

## 2019-11-14 MED ORDER — SERTRALINE HCL 100 MG PO TABS: 100.0000 mg | ORAL_TABLET | Freq: Every day | ORAL | 0 refills | Status: DC

## 2019-11-14 MED ORDER — PROMETHAZINE HCL 25 MG PO TABS: 25.0000 mg | ORAL_TABLET | Freq: Three times a day (TID) | ORAL | 0 refills | Status: DC | PRN

## 2019-11-14 MED ORDER — CYCLOBENZAPRINE HCL 10 MG PO TABS: 10.0000 mg | ORAL_TABLET | Freq: Three times a day (TID) | ORAL | 1 refills | Status: DC | PRN

## 2019-11-14 NOTE — Telephone Encounter (Signed)
Requested Prescriptions   Pending Prescriptions Disp Refills  . promethazine (PHENERGAN) 25 MG tablet 20 tablet 0    Sig: Take 1 tablet (25 mg total) by mouth every 8 (eight) hours as needed for nausea or vomiting.

## 2019-11-14 NOTE — Telephone Encounter (Signed)
Requested Prescriptions   Pending Prescriptions Disp Refills  . cyclobenzaprine (FLEXERIL) 10 MG tablet 60 tablet 0    Sig: Take 1 tablet (10 mg total) by mouth 3 (three) times daily as needed.     Last OV 10/08/2019   Last written 10/08/2019

## 2019-11-17 ENCOUNTER — Telehealth: Payer: Self-pay | Admitting: Family Medicine

## 2019-11-17 DIAGNOSIS — M545 Low back pain: Secondary | ICD-10-CM | POA: Diagnosis not present

## 2019-11-17 DIAGNOSIS — G8929 Other chronic pain: Secondary | ICD-10-CM | POA: Diagnosis not present

## 2019-11-17 DIAGNOSIS — N183 Chronic kidney disease, stage 3 unspecified: Secondary | ICD-10-CM | POA: Diagnosis not present

## 2019-11-17 DIAGNOSIS — Z79899 Other long term (current) drug therapy: Secondary | ICD-10-CM | POA: Diagnosis not present

## 2019-11-17 DIAGNOSIS — E1142 Type 2 diabetes mellitus with diabetic polyneuropathy: Secondary | ICD-10-CM | POA: Diagnosis not present

## 2019-11-17 NOTE — Telephone Encounter (Signed)
Patient requesting a refill on her freestyle libre sensor  CB#[phone]

## 2019-11-18 ENCOUNTER — Other Ambulatory Visit: Payer: Self-pay | Admitting: Family Medicine

## 2019-11-18 ENCOUNTER — Other Ambulatory Visit: Payer: Self-pay

## 2019-11-18 MED ORDER — FREESTYLE LIBRE 14 DAY SENSOR MISC: 1.0000 | 0 refills | Status: DC

## 2019-11-19 ENCOUNTER — Encounter: Payer: Self-pay | Admitting: Family Medicine

## 2019-11-20 ENCOUNTER — Other Ambulatory Visit: Payer: Self-pay

## 2019-11-20 ENCOUNTER — Ambulatory Visit: Payer: Medicaid Other | Admitting: Dermatology

## 2019-11-20 DIAGNOSIS — L309 Dermatitis, unspecified: Secondary | ICD-10-CM

## 2019-11-20 NOTE — Progress Notes (Signed)
   Follow-Up Visit   Subjective  Kathryn Oconnell is a 54 y.o. female who presents for the following: Dermatitis Irritant vs contact with PIPA (face, elbows, legs Mometasone bid, did not use the duoderm).  The following portions of the chart were reviewed this encounter and updated as appropriate:  Tobacco  Allergies  Meds  Problems  Med Hx  Surg Hx  Fam Hx     Review of Systems:  No other skin or systemic complaints except as noted in HPI or Assessment and Plan.  Objective  Well appearing patient in no apparent distress; mood and affect are within normal limits.  A focused examination was performed including face, elbows, legs. Relevant physical exam findings are noted in the Assessment and Plan.  Objective  face, elbows, legs: Erosions and ulcers legs 1.0 x 1.0 x 1.0cm. 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   I, Othelia Pulling, RMA, am acting as scribe for Sarina Ser, MD .  Documentation: I have reviewed the above documentation for accuracy and completeness, and I agree with the above.  Sarina Ser, MD

## 2019-11-21 MED ORDER — FREESTYLE LIBRE 14 DAY SENSOR MISC: 1.0000 | 0 refills | Status: DC

## 2019-12-01 ENCOUNTER — Encounter: Payer: Self-pay | Admitting: Dermatology

## 2019-12-09 ENCOUNTER — Other Ambulatory Visit: Payer: Self-pay | Admitting: Family Medicine

## 2019-12-12 ENCOUNTER — Other Ambulatory Visit: Payer: Self-pay | Admitting: Dermatology

## 2019-12-13 ENCOUNTER — Other Ambulatory Visit: Payer: Self-pay | Admitting: Dermatology

## 2019-12-13 ENCOUNTER — Other Ambulatory Visit: Payer: Self-pay | Admitting: Family Medicine

## 2019-12-13 ENCOUNTER — Encounter: Payer: Self-pay | Admitting: Family Medicine

## 2019-12-15 ENCOUNTER — Other Ambulatory Visit: Payer: Self-pay | Admitting: Family Medicine

## 2019-12-15 DIAGNOSIS — F331 Major depressive disorder, recurrent, moderate: Secondary | ICD-10-CM

## 2019-12-15 MED ORDER — MOMETASONE FUROATE 0.1 % EX OINT: TOPICAL_OINTMENT | CUTANEOUS | 0 refills | Status: DC

## 2019-12-15 MED ORDER — LEVOTHYROXINE SODIUM 88 MCG PO TABS: 88.0000 ug | ORAL_TABLET | Freq: Every day | ORAL | 0 refills | Status: DC

## 2019-12-16 DIAGNOSIS — M545 Low back pain: Secondary | ICD-10-CM | POA: Diagnosis not present

## 2019-12-16 DIAGNOSIS — E1142 Type 2 diabetes mellitus with diabetic polyneuropathy: Secondary | ICD-10-CM | POA: Diagnosis not present

## 2019-12-16 DIAGNOSIS — Z79899 Other long term (current) drug therapy: Secondary | ICD-10-CM | POA: Diagnosis not present

## 2019-12-16 DIAGNOSIS — G8929 Other chronic pain: Secondary | ICD-10-CM | POA: Diagnosis not present

## 2019-12-31 DIAGNOSIS — B009 Herpesviral infection, unspecified: Secondary | ICD-10-CM | POA: Diagnosis not present

## 2019-12-31 DIAGNOSIS — M940 Chondrocostal junction syndrome [Tietze]: Secondary | ICD-10-CM | POA: Diagnosis not present

## 2020-01-06 ENCOUNTER — Other Ambulatory Visit: Payer: Self-pay | Admitting: Family Medicine

## 2020-01-08 ENCOUNTER — Ambulatory Visit: Payer: Medicaid Other | Admitting: Dermatology

## 2020-01-13 DIAGNOSIS — Z79899 Other long term (current) drug therapy: Secondary | ICD-10-CM | POA: Diagnosis not present

## 2020-01-13 DIAGNOSIS — W19XXXS Unspecified fall, sequela: Secondary | ICD-10-CM | POA: Diagnosis not present

## 2020-01-13 DIAGNOSIS — R0789 Other chest pain: Secondary | ICD-10-CM | POA: Diagnosis not present

## 2020-01-13 DIAGNOSIS — M545 Low back pain, unspecified: Secondary | ICD-10-CM | POA: Diagnosis not present

## 2020-01-13 DIAGNOSIS — G8929 Other chronic pain: Secondary | ICD-10-CM | POA: Diagnosis not present

## 2020-01-14 ENCOUNTER — Encounter: Payer: Medicaid Other | Admitting: Family Medicine

## 2020-01-26 ENCOUNTER — Other Ambulatory Visit: Payer: Self-pay | Admitting: Family Medicine

## 2020-01-26 DIAGNOSIS — F331 Major depressive disorder, recurrent, moderate: Secondary | ICD-10-CM

## 2020-01-26 NOTE — Telephone Encounter (Signed)
Refill request Zolpidem  Please advise

## 2020-01-27 ENCOUNTER — Ambulatory Visit: Payer: Medicaid Other | Admitting: Dermatology

## 2020-02-01 DIAGNOSIS — L2389 Allergic contact dermatitis due to other agents: Secondary | ICD-10-CM | POA: Diagnosis not present

## 2020-02-01 DIAGNOSIS — R21 Rash and other nonspecific skin eruption: Secondary | ICD-10-CM | POA: Diagnosis not present

## 2020-02-02 DIAGNOSIS — Z794 Long term (current) use of insulin: Secondary | ICD-10-CM | POA: Diagnosis not present

## 2020-02-02 DIAGNOSIS — E1122 Type 2 diabetes mellitus with diabetic chronic kidney disease: Secondary | ICD-10-CM | POA: Diagnosis not present

## 2020-02-02 DIAGNOSIS — R4182 Altered mental status, unspecified: Secondary | ICD-10-CM | POA: Diagnosis not present

## 2020-02-02 DIAGNOSIS — E1165 Type 2 diabetes mellitus with hyperglycemia: Secondary | ICD-10-CM | POA: Diagnosis not present

## 2020-02-02 DIAGNOSIS — N189 Chronic kidney disease, unspecified: Secondary | ICD-10-CM | POA: Diagnosis not present

## 2020-02-02 DIAGNOSIS — R0902 Hypoxemia: Secondary | ICD-10-CM | POA: Diagnosis not present

## 2020-02-02 DIAGNOSIS — R0689 Other abnormalities of breathing: Secondary | ICD-10-CM | POA: Diagnosis not present

## 2020-02-02 DIAGNOSIS — R52 Pain, unspecified: Secondary | ICD-10-CM | POA: Diagnosis not present

## 2020-02-02 DIAGNOSIS — R404 Transient alteration of awareness: Secondary | ICD-10-CM | POA: Diagnosis not present

## 2020-02-04 ENCOUNTER — Telehealth: Payer: Self-pay

## 2020-02-04 NOTE — Telephone Encounter (Signed)
Transition Care Management Follow-up Telephone Call  Date of discharge and from where: [facility] 02/04/2020  How have you been since you were released from the hospital? Doing well   Any questions or concerns? No  Items Reviewed:  Did the pt receive and understand the discharge instructions provided? Yes   Medications obtained and verified? Yes   Other? No   Any new allergies since your discharge? No   Dietary orders reviewed? Yes  Do you have support at home? Yes   Home Care and Equipment/Supplies: Were home health services ordered? not applicable If so, what is the name of the agency?   Has the agency set up a time to come to the patient's home? not applicable Were any new equipment or medical supplies ordered?  No What is the name of the medical supply agency?  Were you able to get the supplies/equipment? not applicable Do you have any questions related to the use of the equipment or supplies? No  Functional Questionnaire: (I = Independent and D = Dependent) ADLs: I  Bathing/Dressing- I  Meal Prep- I  Eating- I  Maintaining continence- I  Transferring/Ambulation- I  Managing Meds- I  Follow up appointments reviewed:   PCP Hospital f/u appt confirmed? Yes    Specialist Hospital f/u appt confirmed? No    Are transportation arrangements needed? No   If their condition worsens, is the pt aware to call PCP or go to the Emergency Dept.? Yes  Was the patient provided with contact information for the PCP's office or ED? Yes  Was to pt encouraged to call back with questions or concerns? Yes

## 2020-02-04 NOTE — Telephone Encounter (Signed)
Transition Care Management Unsuccessful Follow-up Telephone Call  Date of discharge and from where:  [facility] 02/03/2020  Attempts:  1st Attempt  Reason for unsuccessful TCM follow-up call:  Left voice message

## 2020-02-06 ENCOUNTER — Other Ambulatory Visit: Payer: Self-pay | Admitting: Family Medicine

## 2020-02-08 ENCOUNTER — Other Ambulatory Visit: Payer: Self-pay | Admitting: Adult Health

## 2020-02-10 ENCOUNTER — Encounter (HOSPITAL_COMMUNITY): Payer: Self-pay | Admitting: Registered Nurse

## 2020-02-10 ENCOUNTER — Ambulatory Visit (HOSPITAL_COMMUNITY)
Admission: EM | Admit: 2020-02-10 | Discharge: 2020-02-10 | Disposition: A | Discharge status: Home / Self Care | Payer: Medicaid Other | Attending: Registered Nurse | Admitting: Registered Nurse

## 2020-02-10 ENCOUNTER — Other Ambulatory Visit: Payer: Self-pay

## 2020-02-10 ENCOUNTER — Encounter: Payer: Self-pay | Admitting: Family Medicine

## 2020-02-10 ENCOUNTER — Ambulatory Visit: Payer: Medicaid Other | Admitting: Family Medicine

## 2020-02-10 VITALS — BP 126/82 | HR 82 | Temp 98.6°F | Resp 14 | Ht 64.0 in | Wt 168.0 lb

## 2020-02-10 DIAGNOSIS — E039 Hypothyroidism, unspecified: Secondary | ICD-10-CM

## 2020-02-10 DIAGNOSIS — B001 Herpesviral vesicular dermatitis: Secondary | ICD-10-CM | POA: Diagnosis not present

## 2020-02-10 DIAGNOSIS — E1122 Type 2 diabetes mellitus with diabetic chronic kidney disease: Secondary | ICD-10-CM | POA: Diagnosis not present

## 2020-02-10 DIAGNOSIS — F339 Major depressive disorder, recurrent, unspecified: Secondary | ICD-10-CM | POA: Diagnosis not present

## 2020-02-10 DIAGNOSIS — I10 Essential (primary) hypertension: Secondary | ICD-10-CM

## 2020-02-10 DIAGNOSIS — Z794 Long term (current) use of insulin: Secondary | ICD-10-CM

## 2020-02-10 DIAGNOSIS — F331 Major depressive disorder, recurrent, moderate: Secondary | ICD-10-CM

## 2020-02-10 DIAGNOSIS — F411 Generalized anxiety disorder: Secondary | ICD-10-CM | POA: Diagnosis not present

## 2020-02-10 DIAGNOSIS — F419 Anxiety disorder, unspecified: Secondary | ICD-10-CM

## 2020-02-10 DIAGNOSIS — J3489 Other specified disorders of nose and nasal sinuses: Secondary | ICD-10-CM

## 2020-02-10 DIAGNOSIS — N1832 Chronic kidney disease, stage 3b: Secondary | ICD-10-CM | POA: Diagnosis not present

## 2020-02-10 MED ORDER — VALACYCLOVIR HCL 1 G PO TABS
1000.0000 mg | ORAL_TABLET | Freq: Every day | ORAL | 0 refills | Status: DC | PRN
Start: 2020-02-10 — End: 2020-02-16

## 2020-02-10 MED ORDER — MUPIROCIN 2 % EX OINT: 1.0000 "application " | TOPICAL_OINTMENT | Freq: Two times a day (BID) | CUTANEOUS | 0 refills | Status: DC

## 2020-02-10 NOTE — ED Triage Notes (Signed)
Patient stated has had multiple family members die over the last several years.  Stated that she can't focus/remember.  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  Stated she's a diabetic and has had erratic blood sugars, both high and low.  Reported being worried about finances.  Stated she lives with her son.  Stated her husband is retired, but he has his own home.  Reported financial concerns.  Teary, crying in triage.  Reported that anxiety is 8/10 and depression is 10/10.  Denied SI, HI, AVH.

## 2020-02-10 NOTE — Progress Notes (Signed)
Kathryn Oconnell received her AVS, questions answered and retrieved her personal belongings. She was escorted to the lobby without incident.

## 2020-02-10 NOTE — Progress Notes (Signed)
Subjective:    Patient ID: Kathryn Oconnell, female    DOB: [birth year], 54 y.o.   MRN: [id number]  Patient presents for Facial Irritation (swelling and irritation )   Pt here with on worsening Depression, she worked for 6 weeks at [employer] and then quit  she is taking zoloft , Ativan and her Ambien which she has been on for quite some time.  She does not feel like anything is helping.  She is crying all the time.  She has not been bathing like she should but she is missing her appointments.  Her son has been having to take care of her.  She states that all her family members that passed away on top of everything else and this makes her depressed.   She has not been following up with endocrinology, she did  She has had EMS at her home twice due to h  she was at [facility] on 11/1 CBG  > 500 so she was taken to the emergency room she was given fluids insulin to bring her sugars down told to follow-up as an outpatient.  She has been on steroids due to facial rash which she has been following with dermatology [facility] frustrated at not improved that she went to urgent care.  After that she broke out with Herpes labialis  before she was given some type of creams   She is still at the pain clinic   mEDS she has been injecting 25-35 lantus  15 novolog with meals sliding scale     Review Of Systems:  GEN- denies fatigue, fever, weight loss,weakness, recent illness HEENT- denies eye drainage, change in vision, nasal discharge, CVS- denies chest pain, palpitations RESP- denies SOB, cough, wheeze ABD- denies N/V, change in stools, abd pain GU- denies dysuria, hematuria, dribbling, incontinence MSK- + joint pain, muscle aches, injury Neuro- denies headache, dizziness, syncope, seizure activity       Objective:    BP 126/82   Pulse 82   Temp 98.6 F (37 C) (Temporal)   Resp 14   Ht 5\' 4"  (1.626 m)   Wt 168 lb (76.2 kg)   LMP 06/18/2013 Comment: irregular  SpO2 94%   BMI 28.84  kg/m  GEN- NAD, alert and oriented x3 HEENT- PERRL, EOMI, non injected sclera, pink conjunctiva, MMM, oropharynx clear, mild erythema across the cheeks scabs in the anterior nares hyperpigmented scarring and small sores around her mouth and chin. Neck- Supple, no thyromegaly, no lymphadenopathy CVS- RRR, no murmur RESP-CTAB Psych depressed affect tearful not anxious no apparent hallucinations EXT- No edema Pulses- Radial, DP- 2+        Assessment & Plan:      Problem List Items Addressed This Visit      Unprioritized   Anxiety   CKD (chronic kidney disease) stage 3, GFR 30-59 ml/min (HCC)   Depression    Patient depressive disorder along with anxiety which is been longstanding but worsened with multiple family members being sick the setting of the pandemic issues with her son.  She is not been able to hold down a job.  She has not been taking care of herself.  States that she has not been bathing regularly doing her regular activities her son has been doing a lot.  Her medical problems specifically her blood sugar has been uncontrolled but she had not been consistent with her appointments.  Recommend that she go to behavioral health urgent care today she agrees to do so.  No active suicidal ideation.      Essential hypertension, benign - Primary    Pressure is controlled no change in medication.      Relevant Orders   CBC with Differential/Platelet (Completed)   Comprehensive metabolic panel (Completed)   Herpes labialis   Relevant Medications   mupirocin ointment (BACTROBAN) 2 %   valACYclovir (VALTREX) 1000 MG tablet   Hypothyroidism   Relevant Orders   TSH (Completed)   T3, free (Completed)   T4, free (Completed)   Nasal sore    Bactroban topical given to use the nares and on the sores around her mouth.      Type 2 diabetes mellitus with diabetic chronic kidney disease ([place])    He has been uncontrolled.  She states that she has been injecting her She has been  taking different doses and not consistent.  She has rescheduled with her endocrinologist.  We will check her labs today.      Relevant Orders   Hemoglobin A1c (Completed)      Note: This dictation was prepared with Dragon dictation along with smaller phrase technology. Any transcriptional errors that result from this process are unintentional.

## 2020-02-10 NOTE — Patient Instructions (Addendum)
We will call with lab results Go to [facility] today  Address: [address] [place], [place] [address]
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
## 2020-02-10 NOTE — ED Provider Notes (Signed)
Behavioral Health Urgent Care Medical Screening Exam  Patient Name: Kathryn Oconnell MRN: [id number] Date of Evaluation: 02/10/20 Chief Complaint:   Diagnosis:  Final diagnoses:  MDD (major depressive disorder), recurrent episode, moderate ([place])  Generalized anxiety disorder    History of Present illness: Kathryn Oconnell is a 54 y.o. female patient presents to [facility] as walk in with complaints of worsening depression and unable to stop crying.  [name], 54 y.o., female patient seen face to face by this provider, consulted with Dr. Dwyane Dee; and chart reviewed on 02/10/20.  On evaluation Kathryn Oconnell reports she has had worsening depression for several weeks; states that she is isolating, not caring for self as she usually would, and been thinking about the multiple family member that have past in the last several years and is unable to stop crying.  States she has been trying to find outpatient psychiatric services since therapy helped in the past.  Patient states she lives with her son who is supportive.  Patient is treated with Ativan, Zoloft, and Ambien by her primary doctor.   During evaluation [name] is alert/oriented x 4; calm/cooperative.  Her mood tearful, depressed and congruent with affect.  She does not appear to be responding to internal/external stimuli or delusional thoughts.  Patient denies suicidal/self-harm/homicidal ideation, psychosis, and paranoia.  Patient answered question appropriately.  Patient wanting outpatient psychiatric services.  Resources/referrals given     Psychiatric Specialty Exam  Presentation  General Appearance:Appropriate for Environment;Casual  Eye Contact:Good  Speech:Clear and Coherent;Normal Rate  Speech Volume:Normal  Handedness:Right   Mood and Affect  Mood:Anxious;Depressed  Affect:Tearful;Depressed   Thought Process  Thought Processes:Coherent;Goal Directed  Descriptions of Associations:Intact  Orientation:Full (Time,  Place and Person)  Thought Content:WDL  Hallucinations:None  Ideas of Reference:None  Suicidal Thoughts:No  Homicidal Thoughts:No   Sensorium  Memory:Immediate Good;Recent Good;Remote Good  Judgment:Intact  Insight:Present   Executive Functions  Concentration:Good  Attention Span:Good  [place] of Knowledge:Good  Language:Good   Psychomotor Activity  Psychomotor Activity:Normal   Assets  Assets:Communication Skills;Desire for Improvement;Financial Resources/Insurance;Housing;Resilience;Social Support   Sleep  Sleep:Fair  Number of hours: No data recorded  Physical Exam: Physical Exam Constitutional:      General: She is not in acute distress.    Appearance: Normal appearance. She is obese. She is not ill-appearing.  HENT:     Head: Normocephalic and atraumatic.  Cardiovascular:     Rate and Rhythm: Normal rate and regular rhythm.  Musculoskeletal:        General: Normal range of motion.     Cervical back: Normal range of motion and neck supple.  Skin:    General: Skin is warm and dry.     Comments: Dried scabbed rash below bottom lip.  Patient reports reaction to medication.  PCP aware and treating   Neurological:     General: No focal deficit present.     Mental Status: She is alert and oriented to person, place, and time.  Psychiatric:        Attention and Perception: Attention and perception normal. She does not perceive auditory or visual hallucinations.        Mood and Affect: Mood is anxious and depressed. Affect is tearful.        Speech: Speech normal.        Behavior: Behavior normal. Behavior is cooperative.        Thought Content: Thought content normal. Thought content is not paranoid or delusional.  Thought content does not include homicidal or suicidal ideation.        Cognition and Memory: Cognition and memory normal.        Judgment: Judgment normal.    Review of Systems  Constitutional:       Multiple co morbidities  (HTN, Diabetes, Obese)...   Musculoskeletal: Positive for joint pain and myalgias.  Psychiatric/Behavioral: Positive for depression. Negative for hallucinations, substance abuse and suicidal ideas. The patient is nervous/anxious. Insomnia: Goes to bed around 3:00 AM but sleeps til 1:00 PM  10 hours sleep.   All other systems reviewed and are negative.  Blood pressure 139/82, pulse 70, temperature 98.6 F (37 C), temperature source Oral, resp. rate 16, height 5\' 4"  (1.626 m), weight 154 lb (69.9 kg), last menstrual period 06/18/2013, SpO2 100 %. Body mass index is 26.43 kg/m.  Musculoskeletal: Strength & Muscle Tone: within normal limits Gait & Station: normal Patient leans: N/A   [place] MSE Discharge Disposition for Follow up and Recommendations: Based on my evaluation the patient does not appear to have an emergency medical condition and can be discharged with resources and follow up care in outpatient services for Medication Management, Partial Hospitalization Program, Individual Therapy and Group Therapy      Aboubacar Matsuo, NP 02/10/2020, 6:37 PM

## 2020-02-11 ENCOUNTER — Encounter: Payer: Self-pay | Admitting: Family Medicine

## 2020-02-11 DIAGNOSIS — Z79899 Other long term (current) drug therapy: Secondary | ICD-10-CM | POA: Diagnosis not present

## 2020-02-11 DIAGNOSIS — Z79891 Long term (current) use of opiate analgesic: Secondary | ICD-10-CM | POA: Diagnosis not present

## 2020-02-11 DIAGNOSIS — M545 Low back pain, unspecified: Secondary | ICD-10-CM | POA: Diagnosis not present

## 2020-02-11 DIAGNOSIS — G8929 Other chronic pain: Secondary | ICD-10-CM | POA: Diagnosis not present

## 2020-02-11 LAB — T3, FREE: T3, Free: 1.7 pg/mL — ABNORMAL LOW (ref 2.3–4.2)

## 2020-02-11 LAB — COMPREHENSIVE METABOLIC PANEL
AG Ratio: 1.7 (calc) (ref 1.0–2.5)
ALT: 26 U/L (ref 6–29)
AST: 28 U/L (ref 10–35)
Albumin: 3.9 g/dL (ref 3.6–5.1)
Alkaline phosphatase (APISO): 72 U/L (ref 37–153)
BUN/Creatinine Ratio: 10 (calc) (ref 6–22)
BUN: 15 mg/dL (ref 7–25)
CO2: 22 mmol/L (ref 20–32)
Calcium: 8.3 mg/dL — ABNORMAL LOW (ref 8.6–10.4)
Chloride: 109 mmol/L (ref 98–110)
Creat: 1.46 mg/dL — ABNORMAL HIGH (ref 0.50–1.05)
Globulin: 2.3 g/dL (calc) (ref 1.9–3.7)
Glucose, Bld: 160 mg/dL — ABNORMAL HIGH (ref 65–99)
Potassium: 5.1 mmol/L (ref 3.5–5.3)
Sodium: 138 mmol/L (ref 135–146)
Total Bilirubin: 0.5 mg/dL (ref 0.2–1.2)
Total Protein: 6.2 g/dL (ref 6.1–8.1)

## 2020-02-11 LAB — CBC WITH DIFFERENTIAL/PLATELET
Absolute Monocytes: 842 cells/uL (ref 200–950)
Basophils Absolute: 59 cells/uL (ref 0–200)
Basophils Relative: 0.5 %
Eosinophils Absolute: 211 cells/uL (ref 15–500)
Eosinophils Relative: 1.8 %
HCT: 38 % (ref 35.0–45.0)
Hemoglobin: 13.4 g/dL (ref 11.7–15.5)
Lymphs Abs: 3721 cells/uL (ref 850–3900)
MCH: 39.8 pg — ABNORMAL HIGH (ref 27.0–33.0)
MCHC: 35.3 g/dL (ref 32.0–36.0)
MCV: 112.8 fL — ABNORMAL HIGH (ref 80.0–100.0)
MPV: 11.5 fL (ref 7.5–12.5)
Monocytes Relative: 7.2 %
Neutro Abs: 6868 cells/uL (ref 1500–7800)
Neutrophils Relative %: 58.7 %
Platelets: 160 10*3/uL (ref 140–400)
RBC: 3.37 10*6/uL — ABNORMAL LOW (ref 3.80–5.10)
RDW: 14.4 % (ref 11.0–15.0)
Total Lymphocyte: 31.8 %
WBC: 11.7 10*3/uL — ABNORMAL HIGH (ref 3.8–10.8)

## 2020-02-11 LAB — T4, FREE: Free T4: 0.9 ng/dL (ref 0.8–1.8)

## 2020-02-11 LAB — HEMOGLOBIN A1C
Hgb A1c MFr Bld: 9.3 % of total Hgb — ABNORMAL HIGH (ref <5.7)
Mean Plasma Glucose: 220 (calc)
eAG (mmol/L): 12.2 (calc)

## 2020-02-11 LAB — TSH: TSH: 9.33 mIU/L — ABNORMAL HIGH

## 2020-02-11 NOTE — Assessment & Plan Note (Signed)
Patient depressive disorder along with anxiety which is been longstanding but worsened with multiple family members being sick the setting of the pandemic issues with her son.  She is not been able to hold down a job.  She has not been taking care of herself.  States that she has not been bathing regularly doing her regular activities her son has been doing a lot.  Her medical problems specifically her blood sugar has been uncontrolled but she had not been consistent with her appointments.  Recommend that she go to behavioral health urgent care today she agrees to do so.  No active suicidal ideation.

## 2020-02-11 NOTE — Assessment & Plan Note (Signed)
Bactroban topical given to use the nares and on the sores around her mouth.

## 2020-02-11 NOTE — Assessment & Plan Note (Signed)
Pressure is controlled no change in medication.

## 2020-02-11 NOTE — Assessment & Plan Note (Signed)
He has been uncontrolled.  She states that she has been injecting her She has been taking different doses and not consistent.  She has rescheduled with her endocrinologist.  We will check her labs today.

## 2020-02-12 ENCOUNTER — Telehealth: Payer: Self-pay

## 2020-02-12 DIAGNOSIS — F331 Major depressive disorder, recurrent, moderate: Secondary | ICD-10-CM

## 2020-02-12 DIAGNOSIS — F419 Anxiety disorder, unspecified: Secondary | ICD-10-CM

## 2020-02-12 NOTE — Telephone Encounter (Signed)
Spoke with pt regarding Lab results and recommendations but she said that she has not been set up with psy yet.

## 2020-02-13 ENCOUNTER — Other Ambulatory Visit: Payer: Self-pay | Admitting: Family Medicine

## 2020-02-13 DIAGNOSIS — Z79899 Other long term (current) drug therapy: Secondary | ICD-10-CM | POA: Diagnosis not present

## 2020-02-13 MED ORDER — OZEMPIC (0.25 OR 0.5 MG/DOSE) 2 MG/1.5ML ~~LOC~~ SOPN: 0.5000 mg | PEN_INJECTOR | SUBCUTANEOUS | 11 refills | Status: DC

## 2020-02-13 MED ORDER — LEVOTHYROXINE SODIUM 100 MCG PO TABS: 100.0000 ug | ORAL_TABLET | Freq: Every day | ORAL | 3 refills | Status: DC

## 2020-02-13 NOTE — Telephone Encounter (Signed)
Urgent psychiatry referral placed She can also call [facility], they take her Medicaid

## 2020-02-13 NOTE — Telephone Encounter (Signed)
Call placed to patient and patient made aware per VM.  

## 2020-02-13 NOTE — Telephone Encounter (Signed)
Call placed to patient. LMTRC.  

## 2020-02-13 NOTE — Addendum Note (Signed)
Addended by: Vic Blackbird F on: 02/13/2020 08:01 AM   Modules accepted: Orders

## 2020-02-13 NOTE — Telephone Encounter (Signed)
Ok to refill??  Last office visit 02/10/2020  Last refill 01/07/2020.

## 2020-02-16 ENCOUNTER — Other Ambulatory Visit: Payer: Self-pay | Admitting: Family Medicine

## 2020-02-16 MED ORDER — PROMETHAZINE HCL 25 MG PO TABS
25.0000 mg | ORAL_TABLET | Freq: Three times a day (TID) | ORAL | 0 refills | Status: DC | PRN
Start: 2020-02-16 — End: 2020-03-15

## 2020-02-16 MED ORDER — VALACYCLOVIR HCL 1 G PO TABS: 1000.0000 mg | ORAL_TABLET | Freq: Every day | ORAL | 0 refills | Status: DC | PRN

## 2020-02-16 MED ORDER — LORAZEPAM 1 MG PO TABS
1.0000 mg | ORAL_TABLET | Freq: Three times a day (TID) | ORAL | 0 refills | Status: DC | PRN
Start: 2020-02-16 — End: 2020-04-20

## 2020-02-16 MED ORDER — SERTRALINE HCL 100 MG PO TABS: 150.0000 mg | ORAL_TABLET | Freq: Every day | ORAL | 2 refills | Status: DC

## 2020-02-16 MED ORDER — LORAZEPAM 1 MG PO TABS
1.0000 mg | ORAL_TABLET | Freq: Three times a day (TID) | ORAL | 0 refills | Status: DC | PRN
Start: 2020-02-16 — End: 2020-02-16

## 2020-02-16 NOTE — Telephone Encounter (Signed)
Please Advise

## 2020-02-16 NOTE — Addendum Note (Signed)
Addended by: Sheral Flow on: 02/16/2020 08:11 AM   Modules accepted: Orders

## 2020-02-16 NOTE — Telephone Encounter (Signed)
Please re-submit as transmission to pharmacy failed.

## 2020-02-17 DIAGNOSIS — R220 Localized swelling, mass and lump, head: Secondary | ICD-10-CM | POA: Diagnosis not present

## 2020-02-17 DIAGNOSIS — L0201 Cutaneous abscess of face: Secondary | ICD-10-CM | POA: Diagnosis not present

## 2020-02-17 DIAGNOSIS — R519 Headache, unspecified: Secondary | ICD-10-CM | POA: Diagnosis not present

## 2020-02-24 DIAGNOSIS — R0902 Hypoxemia: Secondary | ICD-10-CM | POA: Diagnosis not present

## 2020-02-24 DIAGNOSIS — M545 Low back pain, unspecified: Secondary | ICD-10-CM | POA: Diagnosis not present

## 2020-02-24 DIAGNOSIS — L03211 Cellulitis of face: Secondary | ICD-10-CM | POA: Diagnosis not present

## 2020-02-24 DIAGNOSIS — G8929 Other chronic pain: Secondary | ICD-10-CM | POA: Diagnosis not present

## 2020-02-24 DIAGNOSIS — Z6826 Body mass index (BMI) 26.0-26.9, adult: Secondary | ICD-10-CM | POA: Diagnosis not present

## 2020-02-24 DIAGNOSIS — S39012A Strain of muscle, fascia and tendon of lower back, initial encounter: Secondary | ICD-10-CM | POA: Diagnosis not present

## 2020-02-24 DIAGNOSIS — E1165 Type 2 diabetes mellitus with hyperglycemia: Secondary | ICD-10-CM | POA: Diagnosis not present

## 2020-02-24 DIAGNOSIS — R531 Weakness: Secondary | ICD-10-CM | POA: Diagnosis not present

## 2020-02-25 DIAGNOSIS — M545 Low back pain, unspecified: Secondary | ICD-10-CM | POA: Diagnosis not present

## 2020-02-25 DIAGNOSIS — G8929 Other chronic pain: Secondary | ICD-10-CM | POA: Diagnosis not present

## 2020-03-11 ENCOUNTER — Telehealth: Payer: Self-pay | Admitting: *Deleted

## 2020-03-11 NOTE — Telephone Encounter (Signed)
Received request from pharmacy for PA on Ambien CR.  PA submitted.   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   PA [id number] approved 03/11/2020- 09/07/2020.

## 2020-03-13 ENCOUNTER — Other Ambulatory Visit: Payer: Self-pay | Admitting: Women's Health

## 2020-03-15 ENCOUNTER — Telehealth: Payer: Self-pay | Admitting: Adult Health

## 2020-03-15 ENCOUNTER — Other Ambulatory Visit (HOSPITAL_COMMUNITY): Payer: Self-pay | Admitting: Family Medicine

## 2020-03-15 ENCOUNTER — Other Ambulatory Visit: Payer: Self-pay | Admitting: Family Medicine

## 2020-03-15 DIAGNOSIS — Z1231 Encounter for screening mammogram for malignant neoplasm of breast: Secondary | ICD-10-CM

## 2020-03-15 NOTE — Telephone Encounter (Signed)
Pt needs refill on medroxyPROGESTERone (PROVERA) 2.5 MG tablet  She is schedule for PAP/physical 04/23/2020 with [facility] (note on medlist-need PAP before further refills, schedule is full, got it scheduled soon as we could)  Please advise & notify pt    [facility]

## 2020-03-16 DIAGNOSIS — F3130 Bipolar disorder, current episode depressed, mild or moderate severity, unspecified: Secondary | ICD-10-CM | POA: Diagnosis not present

## 2020-03-16 DIAGNOSIS — F418 Other specified anxiety disorders: Secondary | ICD-10-CM | POA: Diagnosis not present

## 2020-03-16 DIAGNOSIS — F4310 Post-traumatic stress disorder, unspecified: Secondary | ICD-10-CM | POA: Diagnosis not present

## 2020-03-16 MED ORDER — MEDROXYPROGESTERONE ACETATE 2.5 MG PO TABS: 2.5000 mg | ORAL_TABLET | Freq: Every day | ORAL | 2 refills | Status: DC

## 2020-03-16 NOTE — Telephone Encounter (Signed)
Refilled provera

## 2020-03-17 DIAGNOSIS — M545 Low back pain, unspecified: Secondary | ICD-10-CM | POA: Diagnosis not present

## 2020-03-17 DIAGNOSIS — G8929 Other chronic pain: Secondary | ICD-10-CM | POA: Diagnosis not present

## 2020-03-17 DIAGNOSIS — E1142 Type 2 diabetes mellitus with diabetic polyneuropathy: Secondary | ICD-10-CM | POA: Diagnosis not present

## 2020-03-17 DIAGNOSIS — E1165 Type 2 diabetes mellitus with hyperglycemia: Secondary | ICD-10-CM | POA: Diagnosis not present

## 2020-03-17 DIAGNOSIS — Z79899 Other long term (current) drug therapy: Secondary | ICD-10-CM | POA: Diagnosis not present

## 2020-03-18 ENCOUNTER — Inpatient Hospital Stay (HOSPITAL_COMMUNITY): Admission: RE | Admit: 2020-03-18 | Payer: Medicaid Other | Source: Ambulatory Visit

## 2020-03-21 ENCOUNTER — Other Ambulatory Visit: Payer: Self-pay | Admitting: Family Medicine

## 2020-03-25 ENCOUNTER — Ambulatory Visit (HOSPITAL_COMMUNITY)
Admission: RE | Admit: 2020-03-25 | Discharge: 2020-03-25 | Disposition: A | Discharge status: Home / Self Care | Payer: Medicaid Other | Source: Ambulatory Visit | Attending: Family Medicine | Admitting: Family Medicine

## 2020-03-25 DIAGNOSIS — Z1231 Encounter for screening mammogram for malignant neoplasm of breast: Secondary | ICD-10-CM | POA: Insufficient documentation

## 2020-04-02 ENCOUNTER — Other Ambulatory Visit: Payer: Self-pay | Admitting: Family Medicine

## 2020-04-05 NOTE — Telephone Encounter (Signed)
Ok to refill??  Last office visit 02/10/2020.  Last refill 01/26/2020, #1 refill.

## 2020-04-08 ENCOUNTER — Encounter: Payer: Self-pay | Admitting: Family Medicine

## 2020-04-12 ENCOUNTER — Encounter: Payer: Medicaid Other | Admitting: Family Medicine

## 2020-04-13 ENCOUNTER — Telehealth (INDEPENDENT_AMBULATORY_CARE_PROVIDER_SITE_OTHER): Payer: Medicaid Other | Admitting: Family Medicine

## 2020-04-13 ENCOUNTER — Other Ambulatory Visit: Payer: Self-pay

## 2020-04-13 DIAGNOSIS — F331 Major depressive disorder, recurrent, moderate: Secondary | ICD-10-CM

## 2020-04-13 DIAGNOSIS — E039 Hypothyroidism, unspecified: Secondary | ICD-10-CM

## 2020-04-13 DIAGNOSIS — N1832 Chronic kidney disease, stage 3b: Secondary | ICD-10-CM | POA: Diagnosis not present

## 2020-04-13 DIAGNOSIS — F419 Anxiety disorder, unspecified: Secondary | ICD-10-CM

## 2020-04-13 NOTE — Progress Notes (Unsigned)
Virtual Visit via Telephone Note  I connected with Kathryn Oconnell on 04/14/20 at 2:25pm  by telephone and verified that I am speaking with the correct person using two identifiers.  Location: Patient: At home  Provider:in office    I discussed the limitations, risks, security and privacy concerns of performing an evaluation and management service by telephone and the availability of in person appointments. I also discussed with the patient that there may be a patient responsible charge related to this service. The patient expressed understanding and agreed to proceed.   History of Present Illness: Telehealth visit in the setting of COVID-19 pandemic.  He was seen back in September secondary to severe depression.  She was sent to behavioral health urgent care due to the acuity.  She is not taking Zoloft Ativan and Ambien.  She states she was not taking care of herself she was not bathing not getting up to eat her son was taking care of her.  She has multiple family members that have passed away making her depression worse.  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  At Her last visit I was concerned about the severity of her depression and she was sent to the behavioral health urgent care her Zoloft was increased to 150 mg once a day and also recommended that she see care and establish with a psychiatrist She is currently seeing [facility] for therapist, has appt with psychiatrist tomorrow.  She states that she is trying to get disability she states that she cannot work at this time.  She can barely get herself up during the day she has improved some of her hygiene but still every few days she truly gets up takes a shower and get herself together.  She feels like her memory is not good, states she gets confused at times.  She gave an example of trying to cook things and forgetting what the  ingredients are when she goes to the cabinet.  She was at [facility] and could not find her car  And people to had to assist her to find her car.  She gets very overwhelmed very easily with easy tasks.  She is scheduled to see psychiatrist tomorrow at [facility].  She is going to discuss with that psychiatrist change in her medications as she does not feel like the higher dose of Zoloft along with Ativan and Ambien have been working.  Diabetes mellitus type 2 uncontrolled her last A1c was 9.3% in November I recommend that she take her medications consistently including her Lantus 30 units and her Ozempic which she had not been doing.  Also discussed reestablishing with her endocrinologist.  She was in the emergency room on November 23 secondary to hyperglycemia symptoms Jan 18th has appt with [facility]   Hypothyroidism  she was inconsistent with her medications.  Recommended that she take levothyroxine 100 mcg once a day and follow-up labs 6 weeks however she has not done so.  Note when she was seen in the urgent care back in November she also had acute renal failure creatinine was 1.99 on the labs but has not been rechecked since then.  Baseline is around 1.4 for her chronic kidney disease   Observations/Objective: NAD noted over phone, unable to visualize  Assessment and Plan: Major depressive disorder/generalized anxiety disorder she will follow-up with psychiatry tomorrow for medication management.  Continue psychotherapy.  She has signed up for disability.  We discussed that she will need to see disability psychiatrist and physician.  They may request my records to assist with the case.  AtThis time she feels she is unable to work due to severity of her symptoms   Diabetes  mellitus she will follow-up with endocrinology as scheduled next week.  They can also check her thyroid for her hypothyroidism and adjust medication if needed.   Follow Up Instructions:    I discussed the  assessment and treatment plan with the patient. The patient was provided an opportunity to ask questions and all were answered. The patient agreed with the plan and demonstrated an understanding of the instructions.   The patient was advised to call back or seek an in-person evaluation if the symptoms worsen or if the condition fails to improve as anticipated.  I provided  18 minutes of non-face-to-face time during this encounter. End Time: 2:43pm  Vic Blackbird, MD

## 2020-04-14 ENCOUNTER — Encounter: Payer: Self-pay | Admitting: Family Medicine

## 2020-04-14 DIAGNOSIS — G8929 Other chronic pain: Secondary | ICD-10-CM | POA: Diagnosis not present

## 2020-04-14 DIAGNOSIS — M545 Low back pain, unspecified: Secondary | ICD-10-CM | POA: Diagnosis not present

## 2020-04-14 DIAGNOSIS — F3130 Bipolar disorder, current episode depressed, mild or moderate severity, unspecified: Secondary | ICD-10-CM | POA: Diagnosis not present

## 2020-04-14 DIAGNOSIS — Z79899 Other long term (current) drug therapy: Secondary | ICD-10-CM | POA: Diagnosis not present

## 2020-04-14 DIAGNOSIS — Z79891 Long term (current) use of opiate analgesic: Secondary | ICD-10-CM | POA: Diagnosis not present

## 2020-04-14 DIAGNOSIS — F4310 Post-traumatic stress disorder, unspecified: Secondary | ICD-10-CM | POA: Diagnosis not present

## 2020-04-14 DIAGNOSIS — F418 Other specified anxiety disorders: Secondary | ICD-10-CM | POA: Diagnosis not present

## 2020-04-15 ENCOUNTER — Ambulatory Visit: Payer: Medicaid Other | Admitting: Dermatology

## 2020-04-15 ENCOUNTER — Other Ambulatory Visit: Payer: Self-pay

## 2020-04-15 DIAGNOSIS — R21 Rash and other nonspecific skin eruption: Secondary | ICD-10-CM

## 2020-04-15 DIAGNOSIS — L810 Postinflammatory hyperpigmentation: Secondary | ICD-10-CM

## 2020-04-15 DIAGNOSIS — L309 Dermatitis, unspecified: Secondary | ICD-10-CM

## 2020-04-15 MED ORDER — EUCRISA 2 % EX OINT: 1.0000 "application " | TOPICAL_OINTMENT | Freq: Two times a day (BID) | CUTANEOUS | 2 refills | Status: DC

## 2020-04-15 MED ORDER — HYDROCORTISONE 2.5 % EX CREA: TOPICAL_CREAM | Freq: Two times a day (BID) | CUTANEOUS | 1 refills | Status: DC | PRN

## 2020-04-15 NOTE — Progress Notes (Signed)
   Follow-Up Visit   Subjective  Kathryn Oconnell is a 55 y.o. female who presents for the following: Follow-up (Patient states she is having some break outs on face, She has a long history of dermatitis at hands and was prescribed duoderm and metetasone ointment.  She states she doesn't feel medications worked.   The following portions of the chart were reviewed this encounter and updated as appropriate:  Tobacco  Allergies  Meds  Problems  Med Hx  Surg Hx  Fam Hx      Objective  Well appearing patient in no apparent distress; mood and affect are within normal limits.  A focused examination was performed including face,neck,back, hands, arms, legs. Relevant physical exam findings are noted in the Assessment and Plan.  Objective  face: Scaly brown plaques at face   Objective  bilateral legs: Hyperpigmented patches   Objective  Bilateral hands: Dry scaly patches  Assessment & Plan  Rash and other nonspecific skin eruption face  Ddx includes irritant or allergic contact dermatitis vs atopic dermatitis vs lichen planus pigmentosus > tinea  Start apply hydrocortisone 2.5 % cream apply to face   Topical steroids (such as triamcinolone, fluocinolone, fluocinonide, mometasone, clobetasol, halobetasol, betamethasone, hydrocortisone) can cause thinning and lightening of the skin if they are used for too long in the same area. Your physician has selected the right strength medicine for your problem and area affected on the body. Please use your medication only as directed by your physician to prevent side effects.   Consider biopsy at follow up if not resolving    Ordered Medications: Crisaborole (EUCRISA) 2 % OINT hydrocortisone 2.5 % cream  Post-inflammatory hyperpigmentation bilateral legs  Benign-appearing. Expect to fade with time. .   Hand dermatitis Bilateral hands  Chronic condition with duration over one year. Condition is bothersome to patient. Currently  flared.  Start Eucrisa 2 % ointment apply to hands twice daily.   Return in about 3 weeks (around 05/06/2020) for Rash Follow up.   I, Ruthell Rummage, CMA, am acting as scribe for Forest Gleason, MD.  Documentation: I have reviewed the above documentation for accuracy and completeness, and I agree with the above.  Forest Gleason, MD

## 2020-04-15 NOTE — Patient Instructions (Addendum)
Gentle Skin Care Guide  1. Bathe no more than once a day.  2. Avoid bathing in hot water  3. Use a mild soap like Dove, Vanicream, Cetaphil, CeraVe. Can use Lever 2000 or Cetaphil antibacterial soap  4. Use soap only where you need it. On most days, use it under your arms, between your legs, and on your feet. Let the water rinse other areas unless visibly dirty.  5. When you get out of the bath/shower, use a towel to gently blot your skin dry, don't rub it.  6. While your skin is still a little damp, apply a moisturizing cream such as Vanicream, CeraVe, Cetaphil, Eucerin, Sarna lotion or plain Vaseline Jelly. For hands apply Neutrogena Norwegian Hand Cream or Excipial Hand Cream.  7. Reapply moisturizer any time you start to itch or feel dry.  8. Sometimes using free and clear laundry detergents can be helpful. Fabric softener sheets should be avoided. Downy Free & Gentle liquid, or any liquid fabric softener that is free of dyes and perfumes, it acceptable to use  9. If your doctor has given you prescription creams you may apply moisturizers over them   Topical steroids (such as triamcinolone, fluocinolone, fluocinonide, mometasone, clobetasol, halobetasol, betamethasone, hydrocortisone) can cause thinning and lightening of the skin if they are used for too long in the same area. Your physician has selected the right strength medicine for your problem and area affected on the body. Please use your medication only as directed by your physician to prevent side effects.   

## 2020-04-20 ENCOUNTER — Other Ambulatory Visit: Payer: Self-pay | Admitting: Family Medicine

## 2020-04-20 ENCOUNTER — Encounter: Payer: Self-pay | Admitting: Family Medicine

## 2020-04-20 MED ORDER — LORAZEPAM 1 MG PO TABS: 1.0000 mg | ORAL_TABLET | Freq: Three times a day (TID) | ORAL | 2 refills | Status: DC | PRN

## 2020-04-23 ENCOUNTER — Other Ambulatory Visit: Payer: Medicaid Other | Admitting: Adult Health

## 2020-04-26 ENCOUNTER — Encounter: Payer: Self-pay | Admitting: *Deleted

## 2020-04-28 ENCOUNTER — Encounter: Payer: Self-pay | Admitting: Dermatology

## 2020-05-04 ENCOUNTER — Other Ambulatory Visit: Payer: Self-pay

## 2020-05-04 ENCOUNTER — Encounter: Payer: Self-pay | Admitting: Adult Health

## 2020-05-04 ENCOUNTER — Other Ambulatory Visit (HOSPITAL_COMMUNITY)
Admission: RE | Admit: 2020-05-04 | Discharge: 2020-05-04 | Disposition: A | Discharge status: Home / Self Care | Payer: Medicaid Other | Source: Ambulatory Visit | Attending: Adult Health | Admitting: Adult Health

## 2020-05-04 ENCOUNTER — Ambulatory Visit: Payer: Medicaid Other | Admitting: Adult Health

## 2020-05-04 VITALS — BP 103/61 | HR 76 | Ht 63.5 in | Wt 157.5 lb

## 2020-05-04 DIAGNOSIS — F419 Anxiety disorder, unspecified: Secondary | ICD-10-CM | POA: Diagnosis not present

## 2020-05-04 DIAGNOSIS — Z01419 Encounter for gynecological examination (general) (routine) without abnormal findings: Secondary | ICD-10-CM

## 2020-05-04 DIAGNOSIS — Z1211 Encounter for screening for malignant neoplasm of colon: Secondary | ICD-10-CM | POA: Diagnosis not present

## 2020-05-04 DIAGNOSIS — E039 Hypothyroidism, unspecified: Secondary | ICD-10-CM | POA: Diagnosis not present

## 2020-05-04 DIAGNOSIS — Z0000 Encounter for general adult medical examination without abnormal findings: Secondary | ICD-10-CM

## 2020-05-04 DIAGNOSIS — F32A Depression, unspecified: Secondary | ICD-10-CM | POA: Diagnosis not present

## 2020-05-04 DIAGNOSIS — R195 Other fecal abnormalities: Secondary | ICD-10-CM | POA: Diagnosis not present

## 2020-05-04 DIAGNOSIS — E1122 Type 2 diabetes mellitus with diabetic chronic kidney disease: Secondary | ICD-10-CM

## 2020-05-04 DIAGNOSIS — Z1212 Encounter for screening for malignant neoplasm of rectum: Secondary | ICD-10-CM | POA: Diagnosis not present

## 2020-05-04 LAB — HEMOCCULT GUIAC POC 1CARD (OFFICE): Fecal Occult Blood, POC: POSITIVE — AB

## 2020-05-04 MED ORDER — MEDROXYPROGESTERONE ACETATE 2.5 MG PO TABS: 2.5000 mg | ORAL_TABLET | Freq: Every day | ORAL | 3 refills | Status: DC

## 2020-05-04 NOTE — Progress Notes (Signed)
Patient ID: Kathryn Oconnell, female   DOB: Aug 13, 1965, 55 y.o.   MRN: [id number] History of Present Illness: Kathryn Oconnell is a 55 year old black female,married, PM in for well woman gyn exam and pap. She requests labs. Her husband who is 95 recently diagnosed with stomach cancer. PCP is Dr Buelah Manis.   Current Medications, Allergies, Past Medical History, Past Surgical History, Family History and Social History were reviewed in [facility] record.     Review of Systems: Patient denies any headaches, hearing loss, fatigue, blurred vision, shortness of breath, chest pain, abdominal pain, problems with bowel movements, urination, or intercourse,(not active). No joint pain or mood swings. Decreased libido.  Feels anxious   Physical Exam:BP 103/61 (BP Location: Left Arm, Patient Position: Sitting, Cuff Size: Normal)   Pulse 76   Ht 5' 3.5" (1.613 m)   Wt 157 lb 8 oz (71.4 kg)   LMP 06/18/2013   BMI 27.46 kg/m  General:  Well developed, well nourished, no acute distress Skin:  Warm and dry Neck:  Midline trachea, normal thyroid, good ROM, no lymphadenopathy Lungs; Clear to auscultation bilaterally Breast:  No dominant palpable mass, retraction, or nipple discharge Cardiovascular: Regular rate and rhythm Abdomen:  Soft, non tender, no hepatosplenomegaly Pelvic:  External genitalia is normal in appearance, no lesions.  The vagina is pale with loss of moisture and rugae. Urethra has no lesions or masses. The cervix is smooth. Pap with HRHPV genotyping performed. Uterus is felt to be normal size, shape, and contour.  No adnexal masses or tenderness noted.Bladder is non tender, no masses felt. Rectal: Good sphincter tone, no polyps, or hemorrhoids felt.  Hemoccult positive. Extremities/musculoskeletal:  No swelling or varicosities noted, no clubbing or cyanosis Psych:  No mood changes, alert and cooperative,seems happy AA is 0 Fall risk is high PHQ 9 score is 15 is on Zoloft GAD 7  score is 15, has ativan but she feels like it is not working, she sees a therapists  [facility] - 05/04/20 1449      Pregnancy Intention Screening   Does the patient want to become pregnant in the next year? N/A    Does the patient's partner want to become pregnant in the next year? N/A    Would the patient like to discuss contraceptive options today? N/A      Contraception Wrap Up   Current Method No Method - Other Reason   postmenopausal   End Method No Method - Other Reason   postmenopausal   Contraception Counseling Provided No          Examination chaperoned by Levy Pupa LPN  Impression and Plan; 1. Routine medical exam - Cytology - PAP( [place]) Pap sent  2. Encounter for gynecological examination with Papanicolaou smear of cervix Pap sent Physical in 1 year Pap in 3 if normal Get mammogram  I refilled her provera, she wants to continue, has burning if does not take  Meds ordered this encounter  Medications  . medroxyPROGESTERone (PROVERA) 2.5 MG tablet    Sig: Take 1 tablet (2.5 mg total) by mouth daily.    Dispense:  90 tablet    Refill:  3    Order Specific Question:   Supervising Provider    Answer:   Elonda Husky, LUTHER H [[id number]]    3. Encounter for screening fecal occult blood testing - POCT occult blood stool  4. Anxiety and depression Follow up with Dr Buelah Manis and therapists   5. Hypothyroidism, unspecified type -  TSH - T4, free  6. Type 2 diabetes mellitus with diabetic chronic kidney disease, unspecified CKD stage, unspecified whether long term insulin use (HCC) Check A1c after 05/12/20 - Hemoglobin A1c  7. Fecal occult blood test positive I gave her 3 hemoccult cards to do and bring back  - Ambulatory referral to [facility]  8. Screening for colorectal cancer - Ambulatory referral to [place] [facility]

## 2020-05-06 ENCOUNTER — Other Ambulatory Visit: Payer: Self-pay

## 2020-05-06 ENCOUNTER — Encounter: Payer: Self-pay | Admitting: Internal Medicine

## 2020-05-06 ENCOUNTER — Ambulatory Visit: Payer: Medicaid Other | Admitting: Dermatology

## 2020-05-06 DIAGNOSIS — R21 Rash and other nonspecific skin eruption: Secondary | ICD-10-CM

## 2020-05-06 DIAGNOSIS — L819 Disorder of pigmentation, unspecified: Secondary | ICD-10-CM

## 2020-05-06 DIAGNOSIS — L309 Dermatitis, unspecified: Secondary | ICD-10-CM | POA: Diagnosis not present

## 2020-05-06 MED ORDER — CLOBETASOL PROPIONATE 0.05 % EX OINT: 1.0000 "application " | TOPICAL_OINTMENT | Freq: Two times a day (BID) | CUTANEOUS | 0 refills | Status: DC

## 2020-05-06 NOTE — Patient Instructions (Addendum)
Start clobetasol 0.05% ointment to hands twice daily x 2 weeks. Avoid applying to face, groin, and axilla. Use as directed. Risk of skin atrophy with long-term use reviewed.   Topical steroids (such as triamcinolone, fluocinolone, fluocinonide, mometasone, clobetasol, halobetasol, betamethasone, hydrocortisone) can cause thinning and lightening of the skin if they are used for too long in the same area. Your physician has selected the right strength medicine for your problem and area affected on the body. Please use your medication only as directed by your physician to prevent side effects.    Wound Care Instructions  1. Cleanse wound gently with soap and water once a day then pat dry with clean gauze. Apply a thing coat of Petrolatum (petroleum jelly, "Vaseline") over the wound (unless you have an allergy to this). We recommend that you use a new, sterile tube of Vaseline. Do not pick or remove scabs. Do not remove the yellow or white "healing tissue" from the base of the wound.  2. Cover the wound with fresh, clean, nonstick gauze and secure with paper tape. You may use Band-Aids in place of gauze and tape if the would is small enough, but would recommend trimming much of the tape off as there is often too much. Sometimes Band-Aids can irritate the skin.  3. You should call the office for your biopsy report after 1 week if you have not already been contacted.  4. If you experience any problems, such as abnormal amounts of bleeding, swelling, significant bruising, significant pain, or evidence of infection, please call the office immediately.  5. FOR ADULT SURGERY PATIENTS: If you need something for pain relief you may take 1 extra strength Tylenol (acetaminophen) AND 2 Ibuprofen (200mg  each) together every 4 hours as needed for pain. (do not take these if you are allergic to them or if you have a reason you should not take them.) Typically, you may only need pain medication for 1 to 3 days.

## 2020-05-06 NOTE — Progress Notes (Signed)
   Follow-Up Visit   Subjective  Kathryn Oconnell is a 55 y.o. female who presents for the following: Rash (Patient here today for 3 week follow up. She has been using Eucrisa twice daily to hands and face, HC 2.5% to face twice daily. Patient advises there is no improvement. ).  Previous note shows Ddx includes irritant or allergic contact dermatitis vs atopic dermatitis vs lichen planus pigmentosus > tinea  Patient previously used Musely on her legs and face. Ingredients are: Hydroquinone 12% Niacinamide 2% Hydrocortisone 1%  She has not used it since early 2021  The following portions of the chart were reviewed this encounter and updated as appropriate:   Tobacco  Allergies  Meds  Problems  Med Hx  Surg Hx  Fam Hx      Review of Systems:  No other skin or systemic complaints except as noted in HPI or Assessment and Plan.  Objective  Well appearing patient in no apparent distress; mood and affect are within normal limits.  A focused examination was performed including face, hands, legs, arms. Relevant physical exam findings are noted in the Assessment and Plan.  Objective  face: Scaly brown plaques  Objective  Bilateral Lower Legs: Hyperpigmented patches  Objective  bilateral hands: Dry scaly patches   Assessment & Plan  Rash face  Present over 1 year  Skin / nail biopsy - face Type of biopsy: punch   Informed consent: discussed and consent obtained   Timeout: patient name, date of birth, surgical site, and procedure verified   Patient was prepped and draped in usual sterile fashion: Area prepped with isopropyl alcohol. Anesthesia: the lesion was anesthetized in a standard fashion   Anesthetic:  1% lidocaine w/ epinephrine 1-100,000 buffered w/ 8.4% NaHCO3 Punch size:  3 mm Suture size:  5-0 Suture type: Vicryl Rapide (coated polyglactin 910)   Suture removal (days):  7 Hemostasis achieved with: suture and aluminum chloride   Outcome: patient tolerated  procedure well   Post-procedure details: wound care instructions given   Additional details:  Mupirocin and a dressing applied  Specimen 1 - Surgical pathology Differential Diagnosis: r/o Lichen Planus Pigmentosus vs Exogenous Ochronosis vs Ashy Dermtosis vs PIH vs Medication Induced   Check Margins: No Scaly brown plaques  Post-inflammatory pigmentary changes Bilateral Lower Legs  Benign appearing. Favor PIH over lichen planus pigmentosus or ashy dermatosis  Could consider topical prescription hydroquinone at follow-up  Hand dermatitis bilateral hands  Start clobetasol 0.05% ointment to hands twice daily x 2 weeks. Avoid applying to face, groin, and axilla. Use as directed. Risk of skin atrophy with long-term use reviewed.   Topical steroids (such as triamcinolone, fluocinolone, fluocinonide, mometasone, clobetasol, halobetasol, betamethasone, hydrocortisone) can cause thinning and lightening of the skin if they are used for too long in the same area. Your physician has selected the right strength medicine for your problem and area affected on the body. Please use your medication only as directed by your physician to prevent side effects.    Ordered Medications: clobetasol ointment (TEMOVATE) 0.05 %  Return in about 1 week (around 05/13/2020) for Suture Removal.  Graciella Belton, RMA, am acting as scribe for Forest Gleason, MD .  Documentation: I have reviewed the above documentation for accuracy and completeness, and I agree with the above.  Forest Gleason, MD

## 2020-05-13 ENCOUNTER — Ambulatory Visit: Payer: Medicaid Other | Admitting: Dermatology

## 2020-05-13 LAB — CYTOLOGY - PAP
Comment: NEGATIVE
Diagnosis: UNDETERMINED — AB
High risk HPV: NEGATIVE

## 2020-05-14 ENCOUNTER — Encounter: Payer: Self-pay | Admitting: Adult Health

## 2020-05-14 DIAGNOSIS — R87610 Atypical squamous cells of undetermined significance on cytologic smear of cervix (ASC-US): Secondary | ICD-10-CM

## 2020-05-14 HISTORY — DX: Atypical squamous cells of undetermined significance on cytologic smear of cervix (ASC-US): R87.610

## 2020-05-18 ENCOUNTER — Other Ambulatory Visit: Payer: Self-pay | Admitting: Dermatology

## 2020-05-18 ENCOUNTER — Encounter: Payer: Self-pay | Admitting: Dermatology

## 2020-05-18 ENCOUNTER — Ambulatory Visit (INDEPENDENT_AMBULATORY_CARE_PROVIDER_SITE_OTHER): Payer: Medicaid Other | Admitting: Dermatology

## 2020-05-18 ENCOUNTER — Other Ambulatory Visit: Payer: Self-pay

## 2020-05-18 DIAGNOSIS — Z4802 Encounter for removal of sutures: Secondary | ICD-10-CM | POA: Diagnosis not present

## 2020-05-18 DIAGNOSIS — L810 Postinflammatory hyperpigmentation: Secondary | ICD-10-CM

## 2020-05-18 DIAGNOSIS — R21 Rash and other nonspecific skin eruption: Secondary | ICD-10-CM

## 2020-05-18 MED ORDER — AMBULATORY NON FORMULARY MEDICATION: Status: DC

## 2020-05-18 NOTE — Progress Notes (Signed)
Follow-Up Visit   Subjective  Kathryn Oconnell is a 55 y.o. female who presents for the following: Suture / Staple Removal. Discuss biopsy results. Prescribed a cream for discoloration on her face.   The following portions of the chart were reviewed this encounter and updated as appropriate:   Tobacco  Allergies  Meds  Problems  Med Hx  Surg Hx  Fam Hx      Review of Systems:  No other skin or systemic complaints except as noted in HPI or Assessment and Plan.  Objective  Well appearing patient in no apparent distress; mood and affect are within normal limits.  A focused examination was performed including face, lower legs . Relevant physical exam findings are noted in the Assessment and Plan.  Objective  lower legs: Hyperpigmented patches  Objective  L cheek: brown patches  Objective  face: Biopsy site is clean, dry and intact    Assessment & Plan  Post-inflammatory hyperpigmentation lower legs  Present for over a year, significantly bothersome to patient, currently flared.  Will prescribe [facility] Hydroquinone 12%/kojic acid/vitamin C cream pea sized amount twice daily to the entire face for up to 3 months. This cannot be used more than 3 months due to risk of exogenous ochronosis (permanent dark spots). The patient was advised this is not covered by insurance since it is made by a compounding pharmacy. They will receive an email to check out and the medication will be mailed to their home.   Advised how well this will work depends on how deep the pigment is in her skin  Email address [email or web address]  Ordered Medications: AMBULATORY NON FORMULARY MEDICATION  Rash and other nonspecific skin eruption L cheek  Skin / nail biopsy - L cheek Type of biopsy: punch   Informed consent: discussed and consent obtained   Timeout: patient name, date of birth, surgical site, and procedure verified   Procedure prep:  Patient was prepped and draped in usual  sterile fashion Prep type:  Isopropyl alcohol Anesthesia: the lesion was anesthetized in a standard fashion   Anesthetic:  1% lidocaine w/ epinephrine 1-100,000 buffered w/ 8.4% NaHCO3 Suture type: nylon   Hemostasis achieved with: suture, pressure and aluminum chloride   Outcome: patient tolerated procedure well   Post-procedure details: sterile dressing applied and wound care instructions given   Dressing type: bandage, petrolatum and pressure dressing   Additional details:  Petrolatum and a pressure dressing were applied  Specimen 1 - Surgical pathology Differential Diagnosis:  r/o Lichen Planus Pigmentosus vs Exogenous Ochronosis vs Ashy Dermtosis vs PIH vs Medication Induced   Check Margins: No Hyperpigmentation  Encounter for removal of sutures face  Discussed biopsy results EPIDERMAL HYPERPLASIA WITH FIBROSIS, leaving [place] without clear etiology of hyperpigmented patches. Recommend re biopsy  in a different area    Encounter for Removal of Sutures - Incision site at the face is clean, dry and intact - Wound cleansed, sutures removed, wound cleansed and steri strips applied.  - Discussed pathology results showing EPIDERMAL HYPERPLASIA WITH FIBROSIS  - Patient advised to keep steri-strips dry until they fall off. - Scars remodel for a full year. - Once steri-strips fall off, patient can apply over-the-counter silicone scar cream each night to help with scar remodeling if desired. - Patient advised to call with any concerns or if they notice any new or changing lesions.   Return in about 1 week (around 05/25/2020) for suture removal .  I, Marye Round, CMA, am acting  as scribe for Forest Gleason, MD .  Documentation: I have reviewed the above documentation for accuracy and completeness, and I agree with the above.  Forest Gleason, MD

## 2020-05-19 DIAGNOSIS — E1142 Type 2 diabetes mellitus with diabetic polyneuropathy: Secondary | ICD-10-CM | POA: Diagnosis not present

## 2020-05-19 DIAGNOSIS — Z79891 Long term (current) use of opiate analgesic: Secondary | ICD-10-CM | POA: Diagnosis not present

## 2020-05-19 DIAGNOSIS — G8929 Other chronic pain: Secondary | ICD-10-CM | POA: Diagnosis not present

## 2020-05-19 DIAGNOSIS — M545 Low back pain, unspecified: Secondary | ICD-10-CM | POA: Diagnosis not present

## 2020-05-19 DIAGNOSIS — Z79899 Other long term (current) drug therapy: Secondary | ICD-10-CM | POA: Diagnosis not present

## 2020-05-25 ENCOUNTER — Ambulatory Visit (INDEPENDENT_AMBULATORY_CARE_PROVIDER_SITE_OTHER): Payer: Medicaid Other | Admitting: Dermatology

## 2020-05-25 ENCOUNTER — Other Ambulatory Visit: Payer: Self-pay

## 2020-05-25 ENCOUNTER — Encounter: Payer: Self-pay | Admitting: Dermatology

## 2020-05-25 DIAGNOSIS — F419 Anxiety disorder, unspecified: Secondary | ICD-10-CM | POA: Diagnosis not present

## 2020-05-25 DIAGNOSIS — L810 Postinflammatory hyperpigmentation: Secondary | ICD-10-CM

## 2020-05-25 DIAGNOSIS — F331 Major depressive disorder, recurrent, moderate: Secondary | ICD-10-CM | POA: Diagnosis not present

## 2020-05-25 DIAGNOSIS — R21 Rash and other nonspecific skin eruption: Secondary | ICD-10-CM

## 2020-05-25 NOTE — Patient Instructions (Addendum)
Start [facility] Hydroquinone 8%, Tretinoin 0.1%, Kojic acid 1%, Niacinamide 4%, Fluocinolone 0.025% cream, a pea sized amount nightly to dark spots on face and legs for up to 2 months. This cannot be used more than 3 months due to risk of skin atrophy (thinning) and exogenous ochronosis (permanent dark spots). The patient was advised this is not covered by insurance. They will receive an email to check out and the medication will be mailed to their home.   Instructions for [facility] Medications  One or more of your medications was sent to the [facility] mail order compounding pharmacy. You will receive an email from them and can purchase the medicine through that link. It will then be mailed to your home at the address you confirmed. If for any reason you do not receive an email from them, please check your spam folder. If you still do not find the email, please let us know. [facility] phone number is [phone].

## 2020-05-25 NOTE — Progress Notes (Signed)
Follow-Up Visit   Subjective  Kathryn Oconnell is a 55 y.o. female who presents for the following: Suture / Staple Removal (Discuss biopsy results ).  The following portions of the chart were reviewed this encounter and updated as appropriate:   Tobacco  Allergies  Meds  Problems  Med Hx  Surg Hx  Fam Hx      Review of Systems:  No other skin or systemic complaints except as noted in HPI or Assessment and Plan.  Objective  Well appearing patient in no apparent distress; mood and affect are within normal limits.  A focused examination was performed including face, legs . Relevant physical exam findings are noted in the Assessment and Plan.  Objective  Left cheek: Well healed biopsy site area Multiple thin hyperpigmented plaques at face   Images      Objective  Left Lower Leg - Anterior: Hyperpigmented patches   Assessment & Plan  Rash Left cheek  Chronic condition with duration over one year. Condition is significantly bothersome to patient. Currently flared.  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   Start Skin Medicinals Hydroquinone 8%, Tretinoin 0.1%, Kojic acid 1%, Niacinamide 4%, Fluocinolone 0.025% cream, a pea sized amount nightly to dark spots on face and legs for up to 2 months. This cannot be used more than 3 months due to risk of skin atrophy (thinning) and exogenous ochronosis (permanent dark spots). The patient was advised this is not covered by insurance. They will receive an email to check out and the medication will be mailed to their home.   Encounter for Removal of Sutures - Incision site at the Left cheek  is clean, dry and intact - Wound cleansed, sutures removed, wound cleansed and steri strips applied.  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 - Scars remodel for a full year. - Once steri-strips fall off, patient can apply over-the-counter silicone scar cream each night to help with scar remodeling if desired. - Patient advised to call with any concerns or if they notice any new or changing lesions.     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 This portends a later stage lesion and there is fibrosis in the dermis suggestive of chronicity. A sparse lymphocytic infiltrate is present both peri-adnexally and superficially around vessels but very minimal post-inflammatory change is seen (highlighted by a Debbe Bales stain with appropriately staining controls) and there is also very minimal interface damage.  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 In later stage lesions both enitites may exhibit only pigment incontinence and very minimal interface damage with a sparse infiltrate , which is what is most seen here.   Post-inflammatory hyperpigmentation Left Lower Leg - Anterior  Favor PIH over LPP or Ashy Dermatosis here, although those are possible as well  Start Skin Medicinals Hydroquinone 8%, Tretinoin 0.1%, Kojic acid 1%, Niacinamide 4%, Fluocinolone 0.025% cream, a pea sized amount nightly to dark spots on face for up to 2 months. This cannot be used more than 3 months due to  risk of skin atrophy (thinning) and exogenous ochronosis (permanent dark spots). The patient was advised this is not covered by insurance. They will receive an email to check out and the medication will be mailed to their home.   Other Related  Medications AMBULATORY NON FORMULARY MEDICATION  Return in about 5 weeks (around 10/07/2374) for recheck lichen planus pigmentosus vs ashy dermatosis or other.  I, Marye Round, CMA, am acting as scribe for Forest Gleason, MD .  Documentation: I have reviewed the above documentation for accuracy and completeness, and I agree with the above.  Forest Gleason, MD

## 2020-05-27 ENCOUNTER — Ambulatory Visit (HOSPITAL_COMMUNITY)
Admission: RE | Admit: 2020-05-27 | Discharge: 2020-05-27 | Disposition: A | Discharge status: Home / Self Care | Payer: Medicaid Other | Source: Ambulatory Visit | Attending: Family Medicine | Admitting: Family Medicine

## 2020-05-27 ENCOUNTER — Other Ambulatory Visit: Payer: Self-pay

## 2020-05-27 DIAGNOSIS — Z1231 Encounter for screening mammogram for malignant neoplasm of breast: Secondary | ICD-10-CM | POA: Insufficient documentation

## 2020-05-31 ENCOUNTER — Encounter: Payer: Self-pay | Admitting: Dermatology

## 2020-06-01 ENCOUNTER — Other Ambulatory Visit: Payer: Self-pay | Admitting: Family Medicine

## 2020-06-02 DIAGNOSIS — R404 Transient alteration of awareness: Secondary | ICD-10-CM | POA: Diagnosis not present

## 2020-06-02 DIAGNOSIS — E161 Other hypoglycemia: Secondary | ICD-10-CM | POA: Diagnosis not present

## 2020-06-02 DIAGNOSIS — E162 Hypoglycemia, unspecified: Secondary | ICD-10-CM | POA: Diagnosis not present

## 2020-06-17 DIAGNOSIS — Z79899 Other long term (current) drug therapy: Secondary | ICD-10-CM | POA: Diagnosis not present

## 2020-06-17 DIAGNOSIS — E1165 Type 2 diabetes mellitus with hyperglycemia: Secondary | ICD-10-CM | POA: Diagnosis not present

## 2020-06-17 DIAGNOSIS — E039 Hypothyroidism, unspecified: Secondary | ICD-10-CM | POA: Diagnosis not present

## 2020-06-17 DIAGNOSIS — M545 Low back pain, unspecified: Secondary | ICD-10-CM | POA: Diagnosis not present

## 2020-06-17 DIAGNOSIS — G8929 Other chronic pain: Secondary | ICD-10-CM | POA: Diagnosis not present

## 2020-06-17 DIAGNOSIS — E1142 Type 2 diabetes mellitus with diabetic polyneuropathy: Secondary | ICD-10-CM | POA: Diagnosis not present

## 2020-06-21 ENCOUNTER — Other Ambulatory Visit: Payer: Self-pay

## 2020-06-21 ENCOUNTER — Telehealth: Payer: Self-pay

## 2020-06-21 ENCOUNTER — Other Ambulatory Visit: Payer: Self-pay | Admitting: Family Medicine

## 2020-06-21 DIAGNOSIS — F331 Major depressive disorder, recurrent, moderate: Secondary | ICD-10-CM

## 2020-06-21 MED ORDER — ZOLPIDEM TARTRATE ER 12.5 MG PO TBCR: 12.5000 mg | EXTENDED_RELEASE_TABLET | Freq: Every evening | ORAL | 0 refills | Status: DC | PRN

## 2020-06-21 MED ORDER — LORAZEPAM 1 MG PO TABS
1.0000 mg | ORAL_TABLET | Freq: Three times a day (TID) | ORAL | 0 refills | Status: DC | PRN
Start: 2020-06-21 — End: 2020-08-03

## 2020-06-21 MED ORDER — SERTRALINE HCL 100 MG PO TABS: 150.0000 mg | ORAL_TABLET | Freq: Every day | ORAL | 0 refills | Status: DC

## 2020-06-21 MED ORDER — ATORVASTATIN CALCIUM 40 MG PO TABS: 40.0000 mg | ORAL_TABLET | Freq: Every day | ORAL | 0 refills | Status: DC

## 2020-06-21 MED ORDER — METOPROLOL TARTRATE 25 MG PO TABS: 25.0000 mg | ORAL_TABLET | Freq: Two times a day (BID) | ORAL | 0 refills | Status: DC

## 2020-06-21 NOTE — Telephone Encounter (Signed)
error 

## 2020-06-21 NOTE — Telephone Encounter (Signed)
Patient called need med refill patient cb # [phone]  metoprolol tartrate (LOPRESSOR) 25 MG tablet  atorvastatin (LIPITOR) 40 MG tablet   zolpidem (AMBIEN CR) 12.5 MG CR tablet   sertraline (ZOLOFT) 100 MG tablet   LORazepam (ATIVAN) 1 MG tablet   [facility]: [facility]

## 2020-06-22 ENCOUNTER — Other Ambulatory Visit: Payer: Self-pay | Admitting: *Deleted

## 2020-06-24 ENCOUNTER — Ambulatory Visit: Payer: Medicaid Other | Admitting: Nurse Practitioner

## 2020-06-24 NOTE — Progress Notes (Deleted)
Primary Care Physician:  No primary care provider on file. Primary Gastroenterologist:  Dr.   Rayne Du chief complaint on file.   HPI:   Kathryn Oconnell is a 55 y.o. female who presents on referral from primary care for colonoscopy.  Nurse/phone triage was deferred office visit due to blood in the stool.  Reviewed OB/GYN visit dated 05/04/2020 which was a encounter for well woman exam.  During her exam she was found to be heme positive.  Recommended referral to gastroenterology for colorectal cancer screening.  No history of colonoscopy found in our system.  Today she states she is doing okay overall.  Past Medical History:  Diagnosis Date  . Anxiety   . ASCUS of cervix with negative high risk HPV 05/14/2020   05/2020 ASCUS negative HPV, repeat pap in 3 years with HPV, 5 year CIN3+ risk is 0.27% per ASCCP guidelines   . Chronic kidney disease   . COPD (chronic obstructive pulmonary disease) ([place])   . Depression   . Diabetes mellitus without complication ([place])   . Eczema   . Graves disease   . HSV-2 infection   . Hyperlipidemia   . Hypertension   . Hypothyroid   . Lung nodule   . Menopausal vaginal dryness 02/24/2014  . Migraines   . Neuropathy   . Tobacco use   . Trouble in sleeping 06/25/2015    Past Surgical History:  Procedure Laterality Date  . CESAREAN SECTION     x 2  . FOOT SURGERY     5th digit left foot / Great toe, 2ND TOE  . THYROID SURGERY      Current Outpatient Medications  Medication Sig Dispense Refill  . albuterol (VENTOLIN HFA) 108 (90 Base) MCG/ACT inhaler Inhale 2 puffs into the lungs every 4 (four) hours as needed for wheezing or shortness of breath. 18 g 1  . AMBULATORY NON FORMULARY MEDICATION Medication Name: Compounded Hydroquinone 12%, Kojic Acid 6%, Vitamin C 1%, Niacinamide 2% Cream (Skin Medicinals)    . aspirin 81 MG tablet Take 81 mg by mouth daily.    Marland Kitchen atorvastatin (LIPITOR) 40 MG tablet Take 1 tablet (40 mg total) by mouth daily. 30 tablet  0  . BD PEN NEEDLE MICRO U/F 32G X 6 MM MISC USE AS DIRECTED 100 each 11  . clobetasol ointment (TEMOVATE) 9.24 % Apply 1 application topically 2 (two) times daily. Avoid applying to face, groin, and axilla. Use as directed. Risk of skin atrophy with long-term use reviewed. 30 g 0  . Continuous Blood Gluc Sensor (FREESTYLE LIBRE 14 DAY SENSOR) MISC 1 each by Other route every 14 (fourteen) days. 6 each 0  . cyclobenzaprine (FLEXERIL) 10 MG tablet Take 1 tablet (10 mg total) by mouth 3 (three) times daily as needed. 60 tablet 1  . glucose blood test strip 1 each by Other route 4 (four) times daily. Use as instructed to check blood sugar four times daily.    . insulin aspart (NOVOLOG) 100 UNIT/ML FlexPen Inject 5-11 Units into the skin 3 (three) times daily before meals. Use up to 50U QD SQ per insulin pump 5 pen 2  . insulin glargine (LANTUS SOLOSTAR) 100 UNIT/ML Solostar Pen Inject 15 Units into the skin daily. 5 pen 11  . ipratropium (ATROVENT) 0.06 % nasal spray USE 2 SPRAY(S) IN EACH NOSTRIL THREE TIMES DAILY AS NEEDED FOR RUNNY NOSE 15 mL 0  . levothyroxine (SYNTHROID) 100 MCG tablet Take 1 tablet (100 mcg total)  by mouth daily. 90 tablet 3  . LORazepam (ATIVAN) 1 MG tablet Take 1 tablet (1 mg total) by mouth 3 (three) times daily as needed. for anxiety 90 tablet 0  . medroxyPROGESTERone (PROVERA) 2.5 MG tablet Take 1 tablet (2.5 mg total) by mouth daily. 90 tablet 3  . metoprolol tartrate (LOPRESSOR) 25 MG tablet Take 1 tablet (25 mg total) by mouth 2 (two) times daily. 180 tablet 0  . mupirocin ointment (BACTROBAN) 2 % APPLY TOPICALLY TWICE DAILY 22 g 0  . oxyCODONE (ROXICODONE) 15 MG immediate release tablet Take 1 tablet (15 mg total) by mouth every 4 (four) hours as needed for pain. 60 tablet 0  . promethazine (PHENERGAN) 25 MG tablet TAKE 1 TABLET BY MOUTH EVERY 8 HOURS AS NEEDED FOR NAUSEA FOR VOMITING 20 tablet 0  . Semaglutide,0.25 or 0.5MG /DOS, (OZEMPIC, 0.25 OR 0.5 MG/DOSE,) 2 MG/1.5ML  SOPN Inject 0.5 mg into the skin once a week. 6 mL 11  . sertraline (ZOLOFT) 100 MG tablet Take 1.5 tablets (150 mg total) by mouth daily. 45 tablet 0  . valACYclovir (VALTREX) 1000 MG tablet Take 1 tablet (1,000 mg total) by mouth daily as needed. 20 tablet 0  . zolpidem (AMBIEN CR) 12.5 MG CR tablet Take 1 tablet (12.5 mg total) by mouth at bedtime as needed. 30 tablet 0   No current facility-administered medications for this visit.    Allergies as of 06/24/2020 - Review Complete 05/31/2020  Allergen Reaction Noted  . Erythromycin Other (See Comments) 06/28/2007  . Imitrex [sumatriptan] Other (See Comments) 06/06/2012  . Sulfa antibiotics Other (See Comments) 04/17/2012  . Topamax [topiramate] Nausea And Vomiting 06/06/2012    Family History  Problem Relation Age of Onset  . Stroke Mother        x 3  . Cancer Mother        breast   . Thyroid disease Mother   . Hyperlipidemia Mother   . Cancer Father        stomach cancer   . Hypertension Father   . Heart attack Father 31  . Gout Brother   . Migraines Son   . Heart attack Son   . Hypertension Paternal Uncle   . Heart disease Maternal Grandmother   . Heart attack Maternal Grandmother   . Heart disease Maternal Grandfather   . Heart attack Maternal Grandfather   . Cancer Paternal Grandmother   . Diabetes Paternal Grandfather   . Asthma Daughter     Social History   Socioeconomic History  . Marital status: Married    Spouse name: Not on file  . Number of children: Not on file  . Years of education: Not on file  . Highest education level: Not on file  Occupational History  . Not on file  Tobacco Use  . Smoking status: Current Every Day Smoker    Packs/day: 0.50    Years: 35.00    Pack years: 17.50    Types: Cigarettes  . Smokeless tobacco: Never Used  . Tobacco comment: smokes 10 cig daily  Vaping Use  . Vaping Use: Former  Substance and Sexual Activity  . Alcohol use: No  . Drug use: No  . Sexual  activity: Not Currently    Birth control/protection: Post-menopausal  Other Topics Concern  . Not on file  Social History Narrative  . Not on file   Social Determinants of Health   Financial Resource Strain: Medium Risk  . Difficulty of Paying Living Expenses: Somewhat  hard  Food Insecurity: No Food Insecurity  . Worried About Charity fundraiser in the Last Year: Never true  . Ran Out of Food in the Last Year: Never true  Transportation Needs: No Transportation Needs  . Lack of Transportation (Medical): No  . Lack of Transportation (Non-Medical): No  Physical Activity: Insufficiently Active  . Days of Exercise per Week: 1 day  . Minutes of Exercise per Session: 60 min  Stress: Stress Concern Present  . Feeling of Stress : Very much  Social Connections: Moderately Isolated  . Frequency of Communication with Friends and Family: Three times a week  . Frequency of Social Gatherings with Friends and Family: Once a week  . Attends Religious Services: Never  . Active Member of Clubs or Organizations: No  . Attends Archivist Meetings: Never  . Marital Status: Married  Human resources officer Violence: Not At Risk  . Fear of Current or Ex-Partner: No  . Emotionally Abused: No  . Physically Abused: No  . Sexually Abused: No    Subjective:*** Review of Systems  Constitutional: Negative for chills, fever, malaise/fatigue and weight loss.  HENT: Negative for congestion and sore throat.   Respiratory: Negative for cough and shortness of breath.   Cardiovascular: Negative for chest pain and palpitations.  Gastrointestinal: Negative for abdominal pain, blood in stool, diarrhea, melena, nausea and vomiting.  Musculoskeletal: Negative for joint pain and myalgias.  Skin: Negative for rash.  Neurological: Negative for dizziness and weakness.  Endo/Heme/Allergies: Does not bruise/bleed easily.  Psychiatric/Behavioral: Negative for depression. The patient is not nervous/anxious.   All  other systems reviewed and are negative.      Objective: LMP 06/18/2013  Physical Exam Vitals and nursing note reviewed.  Constitutional:      General: She is not in acute distress.    Appearance: Normal appearance. She is well-developed. She is not ill-appearing, toxic-appearing or diaphoretic.  HENT:     Head: Normocephalic and atraumatic.     Nose: No congestion or rhinorrhea.  Eyes:     General: No scleral icterus. Cardiovascular:     Rate and Rhythm: Normal rate and regular rhythm.     Heart sounds: Normal heart sounds.  Pulmonary:     Effort: Pulmonary effort is normal. No respiratory distress.     Breath sounds: Normal breath sounds.  Abdominal:     General: Bowel sounds are normal.     Palpations: Abdomen is soft. There is no hepatomegaly, splenomegaly or mass.     Tenderness: There is no abdominal tenderness. There is no guarding or rebound.     Hernia: No hernia is present.  Skin:    General: Skin is warm and dry.     Coloration: Skin is not jaundiced.     Findings: No rash.  Neurological:     General: No focal deficit present.     Mental Status: She is alert and oriented to person, place, and time.  Psychiatric:        Attention and Perception: Attention normal.        Mood and Affect: Mood normal.        Speech: Speech normal.        Behavior: Behavior normal.        Thought Content: Thought content normal.        Cognition and Memory: Cognition and memory normal.      Assessment:  ***   Plan: ***    Thank you for allowing [place] to  participate in the care of Tonisha S Cambria  Walden Field, DNP, AGNP-C Adult & Gerontological Nurse Practitioner [facility]   06/24/2020 7:43 AM   Disclaimer: This note was dictated with voice recognition software. Similar sounding words can inadvertently be transcribed and may not be corrected upon review.

## 2020-06-30 ENCOUNTER — Ambulatory Visit: Payer: Medicaid Other | Admitting: Dermatology

## 2020-07-03 ENCOUNTER — Other Ambulatory Visit: Payer: Self-pay | Admitting: Family Medicine

## 2020-07-07 ENCOUNTER — Other Ambulatory Visit: Payer: Self-pay

## 2020-07-07 ENCOUNTER — Ambulatory Visit (INDEPENDENT_AMBULATORY_CARE_PROVIDER_SITE_OTHER): Payer: Medicaid Other | Admitting: Dermatology

## 2020-07-07 DIAGNOSIS — L659 Nonscarring hair loss, unspecified: Secondary | ICD-10-CM | POA: Diagnosis not present

## 2020-07-07 DIAGNOSIS — R21 Rash and other nonspecific skin eruption: Secondary | ICD-10-CM | POA: Diagnosis not present

## 2020-07-07 MED ORDER — HYDROCORTISONE 2.5 % EX CREA: TOPICAL_CREAM | Freq: Two times a day (BID) | CUTANEOUS | 11 refills | Status: DC | PRN

## 2020-07-07 MED ORDER — MUPIROCIN 2 % EX OINT: 1.0000 "application " | TOPICAL_OINTMENT | Freq: Three times a day (TID) | CUTANEOUS | 1 refills | Status: DC

## 2020-07-07 NOTE — Progress Notes (Signed)
   Follow-Up Visit   Subjective  Kathryn Oconnell is a 55 y.o. female who presents for the following: Alopecia (Patient advises she had a perm almost a year ago and fell asleep with it on her scalp.When she washed it out she lost the hair around the hairline. Eventually the hair grew back but about 3 weeks ago she noticed it has started falling back out. ).  Also here for follow-up rash at cheeks. Improving but still dark. Now with some irritated red areas as well.   The following portions of the chart were reviewed this encounter and updated as appropriate:   Tobacco  Allergies  Meds  Problems  Med Hx  Surg Hx  Fam Hx      Review of Systems:  No other skin or systemic complaints except as noted in HPI or Assessment and Plan.  Objective  Well appearing patient in no apparent distress; mood and affect are within normal limits.  A focused examination was performed including face, scalp. Relevant physical exam findings are noted in the Assessment and Plan.  Objective  left cheek: Hyperpigmented patches with several excoriations  Objective  Scalp: Hair breakage bilateral, temporal and parietal scalps  Images         Assessment & Plan  Rash left cheek  Chronic condition with duration over one year. Condition is bothersome to patient. Not currently at goal.  Start HC 2.5% cream as needed for itch BID for up to 2 weeks. Use as directed. Risk of skin atrophy with long-term use reviewed.   Start mupirocin TID to open areas.   Decrease Skin Medicinals hydroquinone/tretinoin/fluocinolone to once daily for 1 month then stop.  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   Return in about 6 weeks (around 08/18/2020).  Graciella Belton, RMA, am acting as scribe for Forest Gleason, MD .  Documentation: I have reviewed the above documentation for accuracy and completeness, and I agree with the above.  Forest Gleason, MD

## 2020-07-07 NOTE — Patient Instructions (Addendum)
For face - Start HC 2.5% cream as needed for itch twice daily for up to 2 weeks. Avoid applying to face, groin, and axilla. Use as directed. Risk of skin atrophy with long-term use reviewed.   Start mupirocin three times daily to open areas.   Decrease Skin Medicinals hydroquinone to once daily for 1 month.    Recommend N-acetylcysteine (NAC) 600 to 1000 mg supplement three times per day to help with picking. Start with twice a day for a week, then increase to three times per day if tolerated.

## 2020-07-12 ENCOUNTER — Encounter: Payer: Self-pay | Admitting: Gastroenterology

## 2020-07-15 DIAGNOSIS — E1142 Type 2 diabetes mellitus with diabetic polyneuropathy: Secondary | ICD-10-CM | POA: Diagnosis not present

## 2020-07-15 DIAGNOSIS — N183 Chronic kidney disease, stage 3 unspecified: Secondary | ICD-10-CM | POA: Diagnosis not present

## 2020-07-15 DIAGNOSIS — M545 Low back pain, unspecified: Secondary | ICD-10-CM | POA: Diagnosis not present

## 2020-07-15 DIAGNOSIS — Z9181 History of falling: Secondary | ICD-10-CM | POA: Diagnosis not present

## 2020-07-15 DIAGNOSIS — G8929 Other chronic pain: Secondary | ICD-10-CM | POA: Diagnosis not present

## 2020-07-15 DIAGNOSIS — Z79899 Other long term (current) drug therapy: Secondary | ICD-10-CM | POA: Diagnosis not present

## 2020-07-19 ENCOUNTER — Encounter: Payer: Self-pay | Admitting: Dermatology

## 2020-07-21 ENCOUNTER — Encounter: Payer: Self-pay | Admitting: Dermatology

## 2020-07-22 ENCOUNTER — Ambulatory Visit: Payer: Medicaid Other | Admitting: Dermatology

## 2020-07-22 ENCOUNTER — Other Ambulatory Visit: Payer: Self-pay

## 2020-07-22 DIAGNOSIS — R21 Rash and other nonspecific skin eruption: Secondary | ICD-10-CM

## 2020-07-22 MED ORDER — TRIAMCINOLONE ACETONIDE 0.025 % EX OINT: 1.0000 "application " | TOPICAL_OINTMENT | Freq: Two times a day (BID) | CUTANEOUS | 0 refills | Status: DC

## 2020-07-25 ENCOUNTER — Other Ambulatory Visit: Payer: Self-pay | Admitting: Family Medicine

## 2020-07-28 ENCOUNTER — Other Ambulatory Visit: Payer: Self-pay | Admitting: Family Medicine

## 2020-07-29 ENCOUNTER — Other Ambulatory Visit: Payer: Self-pay

## 2020-07-29 ENCOUNTER — Ambulatory Visit: Payer: Medicaid Other | Admitting: Dermatology

## 2020-07-29 ENCOUNTER — Encounter: Payer: Self-pay | Admitting: Dermatology

## 2020-07-29 DIAGNOSIS — R21 Rash and other nonspecific skin eruption: Secondary | ICD-10-CM

## 2020-07-29 DIAGNOSIS — L280 Lichen simplex chronicus: Secondary | ICD-10-CM | POA: Diagnosis not present

## 2020-07-29 MED ORDER — TACROLIMUS 0.1 % EX OINT: TOPICAL_OINTMENT | Freq: Two times a day (BID) | CUTANEOUS | 2 refills | Status: DC

## 2020-07-29 NOTE — Patient Instructions (Addendum)
If you have any questions or concerns for your doctor, please call our main line at [phone] and press option 4 to reach your doctor's medical assistant. If no one answers, please leave a voicemail as directed and we will return your call as soon as possible. Messages left after 4 pm will be answered the following business day.   You may also send [place] a message via [place]. We typically respond to MyChart messages within 1-2 business days.  For prescription refills, please ask your pharmacy to contact our office. Our fax number is [phone].  If you have an urgent issue when the clinic is closed that cannot wait until the next business day, you can page your doctor at the number below.    Please note that while we do our best to be available for urgent issues outside of office hours, we are not available 24/7.   If you have an urgent issue and are unable to reach [place], you may choose to seek medical care at your doctor's office, retail clinic, urgent care center, or emergency room.  If you have a medical emergency, please immediately call 911 or go to the emergency department.  Pager Numbers  - Dr. Nehemiah Massed: [phone]  - Dr. Laurence Ferrari: [phone]  - Dr. Nicole Kindred: [phone]  In the event of inclement weather, please call our main line at [phone] for an update on the status of any delays or closures.  Dermatology Medication Tips: Please keep the boxes that topical medications come in in order to help keep track of the instructions about where and how to use these. Pharmacies typically print the medication instructions only on the boxes and not directly on the medication tubes.   If your medication is too expensive, please contact our office at [phone] option 4 or send [place] a message through [place].   We are unable to tell what your co-pay for medications will be in advance as this is different depending on your insurance coverage. However, we may be able to find a  substitute medication at lower cost or fill out paperwork to get insurance to cover a needed medication.   If a prior authorization is required to get your medication covered by your insurance company, please allow [place] 1-2 business days to complete this process.  Drug prices often vary depending on where the prescription is filled and some pharmacies may offer cheaper prices.  The website www.goodrx.com contains coupons for medications through different pharmacies. The prices here do not account for what the cost may be with help from insurance (it may be cheaper with your insurance), but the website can give you the price if you did not use any insurance.  - You can print the associated coupon and take it with your prescription to the pharmacy.  - You may also stop by our office during regular business hours and pick up a GoodRx coupon card.  - If you need your prescription sent electronically to a different pharmacy, notify our office through [facility] or by phone at [phone] option 4.  Recommend Dermablend makeup at [place] daily broad spectrum sunscreen SPF 30+ to sun-exposed areas, reapply every 2 hours as needed. Call for new or changing lesions.  Staying in the shade or wearing long sleeves, sun glasses (UVA+UVB protection) and wide brim hats (4-inch brim around the entire circumference of the hat) are also recommended for sun protection.    Some Recommended Sunscreens Include:  Face Sunscreen Available in Different Tints  Colorescience Sunforgettable Total Protection Brush on Shield  bareMinerals Complexion Rescue Tinted Hydrating Gel Cream Broad Spectrum SPF 30 UnSun mineral tinted (comes in medium/dark and light/medium)  Tinted Face Sunscreen Alastin Hydratint (good for most skin tones, may be slightly dark if you are very fair) Colorescience Sunforgettable Total Protection Face Shield (good for most skin tones) EltaMD UV Physical La Roche Posay Mineral  Tinted Cotz Flawless Complexion   Powder Sunscreen (Nice for reapplying or applying on the go) Colorescience Sunforgettable Total Protection Brush on Shield (available in different tints) - would recommend Tan color for you  Your medications have been sent to [facility] and will be mailed to you after you call them and confirm your information with them.   [place] [phone] [place], [place], [place] [address]

## 2020-07-29 NOTE — Progress Notes (Signed)
   Follow-Up Visit   Subjective  Kathryn Oconnell is a 55 y.o. female who presents for the following: Follow-up (Patient here today for follow up for post inflammatory hyperpigmentation on face. Patient states areas have still not improved from last visit. Patient had flare last week and used cream and woke up with sores all over left cheek. ).  The following portions of the chart were reviewed this encounter and updated as appropriate:  Tobacco  Allergies  Meds  Problems  Med Hx  Surg Hx  Fam Hx      Objective  Well appearing patient in no apparent distress; mood and affect are within normal limits.  A focused examination was performed including face, ears, neck, lips, eyelids. Relevant physical exam findings are noted in the Assessment and Plan.  Objective  Left Buccal Cheek: Many hyperpigmented erythematous thin plaques at face L cheek and chin >remainder of face  Assessment & Plan  Rash and other nonspecific skin eruption Left Buccal Cheek  Exam consistent with lichen planus pigmentosus and previous biopsy c/w late stage LPP.  Chronic condition with duration over one year. Condition is bothersome to patient. Currently flared.  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 Advised to call for any flaring  Recommend daily broad spectrum sunscreen SPF 30+ to sun-exposed areas, reapply every 2 hours as needed. Call for new or changing lesions.  Staying in the shade or wearing long sleeves, sun glasses (UVA+UVB protection) and wide brim hats (4-inch brim around the entire circumference of the hat) are also recommended for sun protection.    tacrolimus (PROTOPIC) 0.1 % ointment - Left Buccal Cheek  Return in  about 6 weeks (around 09/09/2020) for rash follow up.  I, Ruthell Rummage, CMA, am acting as scribe for Forest Gleason, MD.  Documentation: I have reviewed the above documentation for accuracy and completeness, and I agree with the above.  Forest Gleason, MD

## 2020-08-03 ENCOUNTER — Other Ambulatory Visit: Payer: Self-pay | Admitting: Family Medicine

## 2020-08-05 MED ORDER — LORAZEPAM 1 MG PO TABS: 1.0000 mg | ORAL_TABLET | Freq: Three times a day (TID) | ORAL | 0 refills | Status: DC | PRN

## 2020-08-09 DIAGNOSIS — K0889 Other specified disorders of teeth and supporting structures: Secondary | ICD-10-CM | POA: Diagnosis not present

## 2020-08-09 DIAGNOSIS — J329 Chronic sinusitis, unspecified: Secondary | ICD-10-CM | POA: Diagnosis not present

## 2020-08-09 DIAGNOSIS — R6884 Jaw pain: Secondary | ICD-10-CM | POA: Diagnosis not present

## 2020-08-10 ENCOUNTER — Ambulatory Visit: Payer: Medicaid Other | Admitting: Nurse Practitioner

## 2020-08-11 ENCOUNTER — Other Ambulatory Visit (HOSPITAL_BASED_OUTPATIENT_CLINIC_OR_DEPARTMENT_OTHER)
Admission: RE | Admit: 2020-08-11 | Discharge: 2020-08-11 | Disposition: A | Discharge status: Home / Self Care | Payer: Medicaid Other | Source: Ambulatory Visit | Attending: Family Medicine | Admitting: Family Medicine

## 2020-08-11 ENCOUNTER — Other Ambulatory Visit: Payer: Self-pay

## 2020-08-11 ENCOUNTER — Encounter (HOSPITAL_BASED_OUTPATIENT_CLINIC_OR_DEPARTMENT_OTHER): Payer: Self-pay | Admitting: Family Medicine

## 2020-08-11 ENCOUNTER — Ambulatory Visit (HOSPITAL_BASED_OUTPATIENT_CLINIC_OR_DEPARTMENT_OTHER): Payer: Medicaid Other | Admitting: Family Medicine

## 2020-08-11 VITALS — BP 122/80 | HR 79 | Ht 64.0 in | Wt 154.4 lb

## 2020-08-11 DIAGNOSIS — E039 Hypothyroidism, unspecified: Secondary | ICD-10-CM

## 2020-08-11 DIAGNOSIS — E1122 Type 2 diabetes mellitus with diabetic chronic kidney disease: Secondary | ICD-10-CM

## 2020-08-11 DIAGNOSIS — E782 Mixed hyperlipidemia: Secondary | ICD-10-CM

## 2020-08-11 DIAGNOSIS — I10 Essential (primary) hypertension: Secondary | ICD-10-CM | POA: Insufficient documentation

## 2020-08-11 DIAGNOSIS — F419 Anxiety disorder, unspecified: Secondary | ICD-10-CM

## 2020-08-11 DIAGNOSIS — L819 Disorder of pigmentation, unspecified: Secondary | ICD-10-CM

## 2020-08-11 LAB — COMPREHENSIVE METABOLIC PANEL
ALT: 20 U/L (ref 0–44)
AST: 27 U/L (ref 15–41)
Albumin: 4.1 g/dL (ref 3.5–5.0)
Alkaline Phosphatase: 73 U/L (ref 38–126)
Anion gap: 9 (ref 5–15)
BUN: 21 mg/dL — ABNORMAL HIGH (ref 6–20)
CO2: 22 mmol/L (ref 22–32)
Calcium: 8.7 mg/dL — ABNORMAL LOW (ref 8.9–10.3)
Chloride: 103 mmol/L (ref 98–111)
Creatinine, Ser: 1.82 mg/dL — ABNORMAL HIGH (ref 0.44–1.00)
GFR, Estimated: 32 mL/min — ABNORMAL LOW (ref >60)
Glucose, Bld: 125 mg/dL — ABNORMAL HIGH (ref 70–99)
Potassium: 4.5 mmol/L (ref 3.5–5.1)
Sodium: 134 mmol/L — ABNORMAL LOW (ref 135–145)
Total Bilirubin: 0.6 mg/dL (ref 0.3–1.2)
Total Protein: 7.3 g/dL (ref 6.5–8.1)

## 2020-08-11 LAB — LIPID PANEL
Cholesterol: 179 mg/dL (ref 0–200)
HDL: 51 mg/dL (ref >40)
LDL Cholesterol: 106 mg/dL — ABNORMAL HIGH (ref 0–99)
Total CHOL/HDL Ratio: 3.5 RATIO
Triglycerides: 108 mg/dL (ref <150)
VLDL: 22 mg/dL (ref 0–40)

## 2020-08-11 LAB — CBC WITH DIFFERENTIAL/PLATELET
Abs Immature Granulocytes: 0.04 10*3/uL (ref 0.00–0.07)
Basophils Absolute: 0 10*3/uL (ref 0.0–0.1)
Basophils Relative: 0 %
Eosinophils Absolute: 0.2 10*3/uL (ref 0.0–0.5)
Eosinophils Relative: 2 %
HCT: 41.6 % (ref 36.0–46.0)
Hemoglobin: 14.1 g/dL (ref 12.0–15.0)
Immature Granulocytes: 0 %
Lymphocytes Relative: 29 %
Lymphs Abs: 2.9 10*3/uL (ref 0.7–4.0)
MCH: 38.4 pg — ABNORMAL HIGH (ref 26.0–34.0)
MCHC: 33.9 g/dL (ref 30.0–36.0)
MCV: 113.4 fL — ABNORMAL HIGH (ref 80.0–100.0)
Monocytes Absolute: 0.7 10*3/uL (ref 0.1–1.0)
Monocytes Relative: 7 %
Neutro Abs: 6.2 10*3/uL (ref 1.7–7.7)
Neutrophils Relative %: 62 %
Platelets: 171 10*3/uL (ref 150–400)
RBC: 3.67 MIL/uL — ABNORMAL LOW (ref 3.87–5.11)
RDW: 15.9 % — ABNORMAL HIGH (ref 11.5–15.5)
WBC: 10 10*3/uL (ref 4.0–10.5)
nRBC: 0.4 % — ABNORMAL HIGH (ref 0.0–0.2)

## 2020-08-11 LAB — HEMOGLOBIN A1C
Hgb A1c MFr Bld: 9.1 % — ABNORMAL HIGH (ref 4.8–5.6)
Mean Plasma Glucose: 214.47 mg/dL

## 2020-08-11 LAB — TSH: TSH: 5.914 u[IU]/mL — ABNORMAL HIGH (ref 0.350–4.500)

## 2020-08-11 NOTE — Patient Instructions (Signed)
  Medication Instructions:  Your physician recommends that you continue on your current medications as directed. Please refer to the Current Medication list given to you today. --If you need a refill on any your medications before your next appointment, please call your pharmacy first. If no refills are authorized on file call the office.--  Lab Work: Your physician has recommended that you have lab work today: CBC, CMP, Lipid Profile, TSH, and HGB A1C If you have labs (blood work) drawn today and your tests are completely normal, you will receive your results only by: Marland Kitchen MyChart Message (if you have MyChart) OR . A phone call from our staff. Please ensure you check your voicemail in the event that you authorized detailed messages to be left on a delegated number. If you have any lab test that is abnormal or we need to change your treatment, we will call you to review the results.  Referrals/Procedures/Imaging: A referral has been placed for you to Dermatology for evaluation and treatment. Someone from the scheduling department will be in contact with you in regards to coordinating your consultation. If you do not hear from any of the schedulers within 7-10 business days please give our office a call.  A referral has been placed for you to Psychiatry for evaluation and treatment. Someone from the scheduling department will be in contact with you in regards to coordinating your consultation. If you do not hear from any of the schedulers within 7-10 business days please give our office a call.   Follow-Up: Your next appointment:   Your physician recommends that you schedule a follow-up appointment in: 52 WEEKS with Dr. de [place]am  Thanks for letting us be apart of your health journey!!  Primary Care and Sports Medicine   Dr. de [place]am and Worthy Keeler, DNP, AGNP  We recommend signing up for the patient portal called "MyChart".  Sign up information is provided on this After Visit Summary.  MyChart  is used to connect with patients for Virtual Visits (Telemedicine).  Patients are able to view lab/test results, encounter notes, upcoming appointments, etc.  Non-urgent messages can be sent to your provider as well.   To learn more about what you can do with MyChart, please visit --  [email or web address].

## 2020-08-11 NOTE — Progress Notes (Signed)
New Patient Office Visit  Subjective:  Patient ID: Kathryn Oconnell, female    DOB: March 04, 1966  Age: 55 y.o. MRN: [id number]  CC:  Chief Complaint  Patient presents with  . Establish Care    Patient presents to establish care. She is transferring care from   . Depression    Patient admits to having MDD that she has been suffering with since 2013. She has been evaluated by psychiatry in the past but is requesting a referral to another psychiatrist  . Hyperpigmentation    Patient has spots of hyperpigmentation on her face that she has been seeing dermatology about for the last year and a half with no results. Patient is requesting a referral to another dermatologist for a second opinion. Patient states the spots on her face inhibit her from working as she is embarrassed by the way it looks  . Alopecia    Patient states she has been experiencing hair loss. She does have hypothyroidism and admits that she has not had labs checked recently and requests to have her thyroid checked today. She is on current therapy of levothyroxine 88 mg    HPI Kathryn Oconnell is a 55 year old female presenting to establish in clinic.  She has concerns today regarding needing updated lab work.  She has past medical history of hypertension, hypothyroidism, diabetes, chronic kidney disease, anxiety and depression, hyperlipidemia.  Hypertension: Reports that she presently takes metoprolol twice a day.  Indicates that she does check her blood pressure at home and has had normal readings.  Denies any issues with chest pain, shortness of breath, dizziness, lightheadedness.  Hypothyroidism: Reports that she is taking levothyroxine at 88 mcg dose.  TSH has not been checked for about 6 months and was elevated on last reading.  Diabetes: Has associated chronic kidney disease.  Indicates that she is taking Lantus 25 units once daily as well as once weekly Ozempic.  Last hemoglobin A1c was elevated at 9.3% about 6 months ago.   Previously has worked with endocrinology, per chart review it appears that last visit with them was over 1 year ago.  Anxiety and depression: Reports that she presently takes sertraline and Ativan as well as Ambien for associated sleep disorder.  Was being seen by psychiatry, however reports that the psychiatrist office stopped contacting her and she has not followed up with them.  Requesting referral to new psychiatrist at this time.  Requesting refill of Ativan as well.  Hyperlipidemia: Reports that she is presently taking atorvastatin.  Denies any issues with myalgias.  Past Medical History:  Diagnosis Date  . Anxiety   . ASCUS of cervix with negative high risk HPV 05/14/2020   05/2020 ASCUS negative HPV, repeat pap in 3 years with HPV, 5 year CIN3+ risk is 0.27% per ASCCP guidelines   . Chronic kidney disease   . COPD (chronic obstructive pulmonary disease) ([place])   . Depression   . Diabetes mellitus without complication ([place])   . Eczema   . Graves disease   . HSV-2 infection   . Hyperlipidemia   . Hypertension   . Hypothyroid   . Lung nodule   . Menopausal vaginal dryness 02/24/2014  . Migraines   . Neuropathy   . Tobacco use   . Trouble in sleeping 06/25/2015    Past Surgical History:  Procedure Laterality Date  . CESAREAN SECTION     x 2  . FOOT SURGERY     5th digit left foot / [place]  toe, 2ND TOE  . THYROID SURGERY      Family History  Problem Relation Age of Onset  . Stroke Mother        x 3  . Cancer Mother        breast   . Thyroid disease Mother   . Hyperlipidemia Mother   . Cancer Father        stomach cancer   . Hypertension Father   . Heart attack Father 43  . Gout Brother   . Migraines Son   . Heart attack Son   . Hypertension Paternal Uncle   . Heart disease Maternal Grandmother   . Heart attack Maternal Grandmother   . Heart disease Maternal Grandfather   . Heart attack Maternal Grandfather   . Cancer Paternal Grandmother   . Diabetes Paternal  Grandfather   . Asthma Daughter     Social History   Socioeconomic History  . Marital status: Married    Spouse name: Not on file  . Number of children: Not on file  . Years of education: Not on file  . Highest education level: Not on file  Occupational History  . Not on file  Tobacco Use  . Smoking status: Current Every Day Smoker    Packs/day: 0.50    Years: 35.00    Pack years: 17.50    Types: Cigarettes  . Smokeless tobacco: Never Used  . Tobacco comment: smokes 10 cig daily  Vaping Use  . Vaping Use: Former  Substance and Sexual Activity  . Alcohol use: No  . Drug use: No  . Sexual activity: Not Currently    Birth control/protection: Post-menopausal  Other Topics Concern  . Not on file  Social History Narrative  . Not on file   Social Determinants of Health   Financial Resource Strain: Medium Risk  . Difficulty of Paying Living Expenses: Somewhat hard  Food Insecurity: No Food Insecurity  . Worried About Charity fundraiser in the Last Year: Never true  . Ran Out of Food in the Last Year: Never true  Transportation Needs: No Transportation Needs  . Lack of Transportation (Medical): No  . Lack of Transportation (Non-Medical): No  Physical Activity: Insufficiently Active  . Days of Exercise per Week: 1 day  . Minutes of Exercise per Session: 60 min  Stress: Stress Concern Present  . Feeling of Stress : Very much  Social Connections: Moderately Isolated  . Frequency of Communication with Friends and Family: Three times a week  . Frequency of Social Gatherings with Friends and Family: Once a week  . Attends Religious Services: Never  . Active Member of Clubs or Organizations: No  . Attends Archivist Meetings: Never  . Marital Status: Married  Human resources officer Violence: Not At Risk  . Fear of Current or Ex-Partner: No  . Emotionally Abused: No  . Physically Abused: No  . Sexually Abused: No    Objective:   Today's Vitals: BP 122/80   Pulse  79   Ht 5\' 4"  (1.626 m)   Wt 154 lb 6.4 oz (70 kg)   LMP 06/18/2013   SpO2 97%   BMI 26.50 kg/m   Physical Exam  55 year old female in no acute distress Cardiovascular exam with regular rate and rhythm, no murmurs appreciated Lungs clear to auscultation bilaterally  Assessment & Plan:   Problem List Items Addressed This Visit      Cardiovascular and Mediastinum   Essential hypertension, benign - Primary  Blood pressure at goal in office today Can continue with present dosing of metoprolol that patient has been managed on previously; however this not typically first-line for treatment of blood pressure Recommend intermittent monitoring at home Recommend low-salt diet, regular physical activity      Relevant Orders   CBC with Differential/Platelet (Completed)   Comprehensive metabolic panel (Completed)   TSH (Completed)     Endocrine   Hypothyroidism    Continue with current levothyroxine dose Will check TSH to monitor treatment status Adjustments to medication pending results of TSH      Relevant Medications   levothyroxine (SYNTHROID) 88 MCG tablet   Other Relevant Orders   TSH (Completed)   Type 2 diabetes mellitus with diabetic chronic kidney disease ([place])    Current use of insulin, currently uncontrolled Check hemoglobin A1c today Continue with present regimen including Lantus 25 units daily, once weekly Ozempic Consider referral back to endocrinology      Relevant Orders   Comprehensive metabolic panel (Completed)   Lipid panel (Completed)   Hemoglobin A1c (Completed)     Other   Anxiety    Continue with sertraline and lorazepam as needed Reported that she needed refill of lorazepam, however appears that patient has new prescription was sent to the pharmacy by prior PCP's office.  Spoke with pharmacy regarding this and they indicate that because patient has Medicaid, only a short list of providers on record with Medicaid are able to have prescriptions  covered and this most recent prescription is not by 1 of those providers.  Instructed that we would need to contact insurance to have changes made to this provider list.  Will reach out to insurance company to discuss this further. Referral placed for patient to establish with new psychiatrist      Relevant Orders   CBC with Differential/Platelet (Completed)   TSH (Completed)   Ambulatory referral to Psychiatry   Mixed hyperlipidemia    Check lipid panel Continue with atorvastatin      Relevant Orders   Comprehensive metabolic panel (Completed)   Lipid panel (Completed)    Other Visit Diagnoses    Hyperpigmentation of skin       Relevant Orders   Ambulatory referral to Dermatology      Outpatient Encounter Medications as of 08/11/2020  Medication Sig  . aspirin 81 MG tablet Take 81 mg by mouth daily.  Marland Kitchen atorvastatin (LIPITOR) 40 MG tablet Take 1 tablet by mouth once daily  . BD PEN NEEDLE MICRO U/F 32G X 6 MM MISC USE AS DIRECTED  . clobetasol ointment (TEMOVATE) 5.05 % Apply 1 application topically 2 (two) times daily. Avoid applying to face, groin, and axilla. Use as directed. Risk of skin atrophy with long-term use reviewed.  . Continuous Blood Gluc Sensor (FREESTYLE LIBRE 14 DAY SENSOR) MISC 1 each by Other route every 14 (fourteen) days.  . cyclobenzaprine (FLEXERIL) 10 MG tablet Take 1 tablet (10 mg total) by mouth 3 (three) times daily as needed.  Arna Medici 88 MCG tablet Take 1 tablet by mouth once daily  . glucose blood test strip 1 each by Other route 4 (four) times daily. Use as instructed to check blood sugar four times daily.  . hydrocortisone 2.5 % cream Apply topically 2 (two) times daily as needed (Rash).  . insulin aspart (NOVOLOG) 100 UNIT/ML FlexPen Inject 5-11 Units into the skin 3 (three) times daily before meals. Use up to 50U QD SQ per insulin pump  . insulin glargine (  LANTUS SOLOSTAR) 100 UNIT/ML Solostar Pen Inject 15 Units into the skin daily.  Marland Kitchen  ipratropium (ATROVENT) 0.06 % nasal spray USE 2 SPRAY(S) IN EACH NOSTRIL THREE TIMES DAILY AS NEEDED FOR RUNNY NOSE  . levothyroxine (SYNTHROID) 88 MCG tablet Take 88 mcg by mouth daily before breakfast.  . LORazepam (ATIVAN) 1 MG tablet Take 1 tablet (1 mg total) by mouth 3 (three) times daily as needed. for anxiety  . medroxyPROGESTERone (PROVERA) 2.5 MG tablet Take 1 tablet (2.5 mg total) by mouth daily.  . metoprolol tartrate (LOPRESSOR) 25 MG tablet Take 1 tablet (25 mg total) by mouth 2 (two) times daily.  . mupirocin ointment (BACTROBAN) 2 % APPLY TOPICALLY TWICE DAILY  . mupirocin ointment (BACTROBAN) 2 % Apply 1 application topically 3 (three) times daily. To open areas  . oxyCODONE (ROXICODONE) 15 MG immediate release tablet Take 1 tablet (15 mg total) by mouth every 4 (four) hours as needed for pain.  Marland Kitchen OZEMPIC, 0.25 OR 0.5 MG/DOSE, 2 MG/1.5ML SOPN INJECT 0.5 MG INTO THE SKIN  ONCE A WEEK  . promethazine (PHENERGAN) 25 MG tablet TAKE 1 TABLET BY MOUTH EVERY 8 HOURS AS NEEDED FOR NAUSEA FOR VOMITING  . sertraline (ZOLOFT) 100 MG tablet Take 1.5 tablets (150 mg total) by mouth daily.  . tacrolimus (PROTOPIC) 0.1 % ointment Apply topically 2 (two) times daily. Apply thin layer to affected areas of face  . valACYclovir (VALTREX) 1000 MG tablet Take 1 tablet (1,000 mg total) by mouth daily as needed.  . zolpidem (AMBIEN CR) 12.5 MG CR tablet Take 1 tablet (12.5 mg total) by mouth at bedtime as needed.  . [DISCONTINUED] levothyroxine (SYNTHROID) 100 MCG tablet Take 1 tablet (100 mcg total) by mouth daily.  . [DISCONTINUED] albuterol (VENTOLIN HFA) 108 (90 Base) MCG/ACT inhaler Inhale 2 puffs into the lungs every 4 (four) hours as needed for wheezing or shortness of breath. (Patient not taking: Reported on 08/11/2020)  . [DISCONTINUED] AMBULATORY NON FORMULARY MEDICATION Medication Name: Compounded Hydroquinone 12%, Kojic Acid 6%, Vitamin C 1%, Niacinamide 2% Cream (Skin Medicinals) (Patient not  taking: Reported on 08/11/2020)   No facility-administered encounter medications on file as of 08/11/2020.   Spent 60 minutes on this patient encounter, including preparation, chart review, face-to-face counseling with patient and coordination of care, and documentation of encounter  Follow-up: Return in about 6 weeks (around 09/22/2020) for Follow Up.   Warren Kugelman J De [place]am, MD

## 2020-08-12 ENCOUNTER — Encounter: Payer: Self-pay | Admitting: Dermatology

## 2020-08-13 ENCOUNTER — Other Ambulatory Visit: Payer: Self-pay | Admitting: Family Medicine

## 2020-08-13 ENCOUNTER — Encounter: Payer: Self-pay | Admitting: Dermatology

## 2020-08-13 DIAGNOSIS — Z79899 Other long term (current) drug therapy: Secondary | ICD-10-CM | POA: Diagnosis not present

## 2020-08-13 DIAGNOSIS — N183 Chronic kidney disease, stage 3 unspecified: Secondary | ICD-10-CM | POA: Diagnosis not present

## 2020-08-13 DIAGNOSIS — Z9181 History of falling: Secondary | ICD-10-CM | POA: Diagnosis not present

## 2020-08-13 DIAGNOSIS — E1142 Type 2 diabetes mellitus with diabetic polyneuropathy: Secondary | ICD-10-CM | POA: Diagnosis not present

## 2020-08-13 DIAGNOSIS — M545 Low back pain, unspecified: Secondary | ICD-10-CM | POA: Diagnosis not present

## 2020-08-13 DIAGNOSIS — G8929 Other chronic pain: Secondary | ICD-10-CM | POA: Diagnosis not present

## 2020-08-13 DIAGNOSIS — Z6831 Body mass index (BMI) 31.0-31.9, adult: Secondary | ICD-10-CM | POA: Diagnosis not present

## 2020-08-16 ENCOUNTER — Telehealth (HOSPITAL_BASED_OUTPATIENT_CLINIC_OR_DEPARTMENT_OTHER): Payer: Self-pay | Admitting: Family Medicine

## 2020-08-16 ENCOUNTER — Encounter (HOSPITAL_BASED_OUTPATIENT_CLINIC_OR_DEPARTMENT_OTHER): Payer: Self-pay | Admitting: Family Medicine

## 2020-08-16 MED ORDER — ATORVASTATIN CALCIUM 40 MG PO TABS: 1.0000 | ORAL_TABLET | Freq: Every day | ORAL | 1 refills | Status: DC

## 2020-08-16 MED ORDER — LORAZEPAM 1 MG PO TABS: 1.0000 mg | ORAL_TABLET | Freq: Three times a day (TID) | ORAL | 0 refills | Status: DC | PRN

## 2020-08-16 NOTE — Assessment & Plan Note (Signed)
Check lipid panel Continue with atorvastatin

## 2020-08-16 NOTE — Assessment & Plan Note (Signed)
Current use of insulin, currently uncontrolled Check hemoglobin A1c today Continue with present regimen including Lantus 25 units daily, once weekly Ozempic Consider referral back to endocrinology

## 2020-08-16 NOTE — Assessment & Plan Note (Signed)
Continue with sertraline and lorazepam as needed Reported that she needed refill of lorazepam, however appears that patient has new prescription was sent to the pharmacy by prior PCP's office.  Spoke with pharmacy regarding this and they indicate that because patient has Medicaid, only a short list of providers on record with Medicaid are able to have prescriptions covered and this most recent prescription is not by 1 of those providers.  Instructed that we would need to contact insurance to have changes made to this provider list.  Will reach out to insurance company to discuss this further. Referral placed for patient to establish with new psychiatrist

## 2020-08-16 NOTE — Assessment & Plan Note (Signed)
Blood pressure at goal in office today Can continue with present dosing of metoprolol that patient has been managed on previously; however this not typically first-line for treatment of blood pressure Recommend intermittent monitoring at home Recommend low-salt diet, regular physical activity

## 2020-08-16 NOTE — Assessment & Plan Note (Signed)
Continue with current levothyroxine dose Will check TSH to monitor treatment status Adjustments to medication pending results of TSH

## 2020-08-16 NOTE — Telephone Encounter (Signed)
Patient called stated she was returning Dr. Frederich Chick CMA call from earlier today. Please advise.

## 2020-08-16 NOTE — Telephone Encounter (Signed)
Called patient and left voicemail stating that I was able to contact patient's insurance company regarding the provider lock instituted by the state of [place] for patients with her particular medical insurance Upon confirming with the patients case worker, Dr. Tennis Must [place] has been added to the list of approved prescribers and I am now able to send in the refills the patient is requesting Per Dr. Tennis Must [place] I will send in Lorazepam 1 mg 90 tab with no refills and atorvastatin 40 mg 30 tab with 1 refill  to [facility] and [address]

## 2020-08-17 ENCOUNTER — Telehealth: Payer: Self-pay | Admitting: Dermatology

## 2020-08-17 NOTE — Telephone Encounter (Signed)
Tried to reach patient twice yesterday to respond to her concerns regarding hair loss, mouth changes and skin. We are closed Friday so did not receive her messages until Monday.  I suspect she may have seen information online for lichen planopilaris (which I did not see on her exam previously) causing permanent hair loss rather than lichen planus pigmentosus as google pulls up info on lichen planopilaris instead of lichen planus pigmentosus if you search hair loss with it.   However, it went straight to voicemail twice, so I responded by MyChart addressing her various concerns.   I also offered her an appointment for today since she said she has had increased hair loss since her last visit rather than improvement and she is reporting an issue with her mouth.   We did not hear back and it does not look like she saw the MyChart messages, so Estill Bamberg called her this morning to offer her the appointment for today but she did not answer.

## 2020-08-18 ENCOUNTER — Telehealth (HOSPITAL_BASED_OUTPATIENT_CLINIC_OR_DEPARTMENT_OTHER): Payer: Self-pay

## 2020-08-18 MED ORDER — LEVOTHYROXINE SODIUM 112 MCG PO TABS: 112.0000 ug | ORAL_TABLET | Freq: Every day | ORAL | 2 refills | Status: DC

## 2020-08-18 NOTE — Addendum Note (Signed)
Addended by: Campbell Riches on: 08/18/2020 04:56 PM   Modules accepted: Orders

## 2020-08-18 NOTE — Telephone Encounter (Signed)
Called patient to discuss recommendations. Patient states she has been taking 100 mcg of eurothyrox. Patient states she has historically been on brandd name Synthroid and wonders if the change to generic is the reasoning for her thyroid levels. Per Dr. De [place]am will increase to 112 mcg. 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 Patient also inquiring about whether or not she should have a referral to a new endocrinologist, patient was being seen by Dr. Dwyane Dee but states she is unable to see him anymore due to "no showing" to many appointments. Patient inquired again about dermatology referral that was placed during office visit. Patients request was to see an Designer, multimedia however the only Provider I could find meeting her requests is not in network with her insurance. A dermatology referral was placed to an in Network provider but the patient states she still has not been contacted.  Will forward to Dr. De [place]am for recommendations

## 2020-08-18 NOTE — Telephone Encounter (Signed)
-----   Message from Raymond J de [place]am, MD sent at 08/16/2020  1:32 PM EDT ----- Normal white blood cell count with slight decrease in red blood cell count, normal hemoglobin.  Slightly decreased sodium which is likely related to elevated blood sugar.  Kidney function slightly worsened since last lab work.  TSH is improved since last checked 6 months ago, however still elevated -if we have been taking 88 mcg dose, would increase to 100 mcg daily.  Take this new dose for 6 weeks and we will repeat TSH at that time to monitor response.  Hemoglobin A1c elevated at 9.1% which is about where it was 6 months ago.  This indicates that diabetes is not adequately controlled.  Would check blood sugars regularly during the day including fasting blood sugar reading, blood sugar readings prior to meals -this will allow [place] to most accurately adjust medications.  Cholesterol panel overall stable

## 2020-08-18 NOTE — Telephone Encounter (Signed)
LMTCB  to discuss lab results and recommendations

## 2020-08-19 ENCOUNTER — Other Ambulatory Visit: Payer: Self-pay | Admitting: Family Medicine

## 2020-08-19 ENCOUNTER — Ambulatory Visit: Payer: Self-pay | Admitting: Dermatology

## 2020-08-19 DIAGNOSIS — F331 Major depressive disorder, recurrent, moderate: Secondary | ICD-10-CM

## 2020-08-23 ENCOUNTER — Ambulatory Visit: Payer: Medicaid Other | Admitting: Gastroenterology

## 2020-08-24 ENCOUNTER — Other Ambulatory Visit (HOSPITAL_BASED_OUTPATIENT_CLINIC_OR_DEPARTMENT_OTHER): Payer: Self-pay

## 2020-08-24 DIAGNOSIS — E1122 Type 2 diabetes mellitus with diabetic chronic kidney disease: Secondary | ICD-10-CM

## 2020-08-24 DIAGNOSIS — F419 Anxiety disorder, unspecified: Secondary | ICD-10-CM

## 2020-08-24 DIAGNOSIS — F5101 Primary insomnia: Secondary | ICD-10-CM

## 2020-08-24 DIAGNOSIS — E039 Hypothyroidism, unspecified: Secondary | ICD-10-CM

## 2020-08-24 DIAGNOSIS — N1832 Chronic kidney disease, stage 3b: Secondary | ICD-10-CM

## 2020-08-24 DIAGNOSIS — F331 Major depressive disorder, recurrent, moderate: Secondary | ICD-10-CM

## 2020-08-24 NOTE — Telephone Encounter (Signed)
Patient called in requesting prescription refill on Ambien sent to [facility] on file in [place] patient I would forward to Dr. de [place]am for advisement

## 2020-08-25 ENCOUNTER — Telehealth: Payer: Self-pay

## 2020-08-25 DIAGNOSIS — E890 Postprocedural hypothyroidism: Secondary | ICD-10-CM

## 2020-08-25 MED ORDER — ZOLPIDEM TARTRATE ER 12.5 MG PO TBCR: 12.5000 mg | EXTENDED_RELEASE_TABLET | Freq: Every evening | ORAL | 0 refills | Status: DC | PRN

## 2020-08-25 NOTE — Telephone Encounter (Signed)
Labs entered. Appt made for pt

## 2020-08-25 NOTE — Addendum Note (Signed)
Addended by: Campbell Riches on: 08/25/2020 01:09 PM   Modules accepted: Orders

## 2020-08-25 NOTE — Telephone Encounter (Signed)
Received a referral on patient from [place], patient was last seen here 03/2019, she needs updated thyroid labs before we can see her. I left pt a VM.   Could you please update lab orders

## 2020-08-25 NOTE — Telephone Encounter (Signed)
Per Dr. de [place]am Patient can have 1 authorized refill but will defer further management to [place] patients insurance to verify in network Psychiatry and will place referral Referral also placed for Endocrinology (see phone note from 05/18)

## 2020-09-09 ENCOUNTER — Ambulatory Visit: Payer: Medicaid Other | Admitting: Dermatology

## 2020-09-10 ENCOUNTER — Other Ambulatory Visit (HOSPITAL_BASED_OUTPATIENT_CLINIC_OR_DEPARTMENT_OTHER): Payer: Self-pay

## 2020-09-10 ENCOUNTER — Ambulatory Visit: Payer: Medicaid Other | Admitting: Internal Medicine

## 2020-09-10 DIAGNOSIS — M545 Low back pain, unspecified: Secondary | ICD-10-CM | POA: Diagnosis not present

## 2020-09-10 DIAGNOSIS — F331 Major depressive disorder, recurrent, moderate: Secondary | ICD-10-CM

## 2020-09-10 DIAGNOSIS — G8929 Other chronic pain: Secondary | ICD-10-CM | POA: Diagnosis not present

## 2020-09-10 DIAGNOSIS — Z79899 Other long term (current) drug therapy: Secondary | ICD-10-CM | POA: Diagnosis not present

## 2020-09-10 DIAGNOSIS — E1142 Type 2 diabetes mellitus with diabetic polyneuropathy: Secondary | ICD-10-CM | POA: Diagnosis not present

## 2020-09-10 DIAGNOSIS — M25512 Pain in left shoulder: Secondary | ICD-10-CM | POA: Diagnosis not present

## 2020-09-10 NOTE — Telephone Encounter (Signed)
Received RX request for Sertraline 100 mg  Will forward to Dr. de [place]am for advisement

## 2020-09-13 MED ORDER — SERTRALINE HCL 100 MG PO TABS: 150.0000 mg | ORAL_TABLET | Freq: Every day | ORAL | 0 refills | Status: DC

## 2020-09-17 ENCOUNTER — Telehealth (HOSPITAL_BASED_OUTPATIENT_CLINIC_OR_DEPARTMENT_OTHER): Payer: Self-pay

## 2020-09-17 NOTE — Telephone Encounter (Signed)
Patient contacted the office stating that the referral that was placed to [facility] is not within her insurance coverage Upon contacting insurance company I have 3 providers who have been verified in network Patient is aware and agreeable to me sending referrals to the 3 verified in network providers Patient has also requested to have a different endo referral placed to a different physician I will research and refer accordingly   Referrals sent to --  [facility] - [phone] [address], [place] [address] Dr. Lennice Sites - [phone]  [place]. [place], [place] [address]

## 2020-09-21 ENCOUNTER — Other Ambulatory Visit (HOSPITAL_BASED_OUTPATIENT_CLINIC_OR_DEPARTMENT_OTHER): Payer: Self-pay | Admitting: Family Medicine

## 2020-09-22 ENCOUNTER — Other Ambulatory Visit: Payer: Self-pay

## 2020-09-22 ENCOUNTER — Ambulatory Visit (HOSPITAL_BASED_OUTPATIENT_CLINIC_OR_DEPARTMENT_OTHER): Payer: Medicaid Other | Admitting: Family Medicine

## 2020-09-22 ENCOUNTER — Encounter (HOSPITAL_BASED_OUTPATIENT_CLINIC_OR_DEPARTMENT_OTHER): Payer: Self-pay | Admitting: Family Medicine

## 2020-09-22 VITALS — BP 98/57 | HR 74 | Ht 64.0 in | Wt 151.0 lb

## 2020-09-22 DIAGNOSIS — F32A Depression, unspecified: Secondary | ICD-10-CM

## 2020-09-22 DIAGNOSIS — E1122 Type 2 diabetes mellitus with diabetic chronic kidney disease: Secondary | ICD-10-CM

## 2020-09-22 DIAGNOSIS — F419 Anxiety disorder, unspecified: Secondary | ICD-10-CM

## 2020-09-22 DIAGNOSIS — E039 Hypothyroidism, unspecified: Secondary | ICD-10-CM

## 2020-09-22 MED ORDER — SEMAGLUTIDE (1 MG/DOSE) 4 MG/3ML ~~LOC~~ SOPN: 1.0000 mg | PEN_INJECTOR | SUBCUTANEOUS | 2 refills | Status: DC

## 2020-09-22 MED ORDER — BD PEN NEEDLE MICRO U/F 32G X 6 MM MISC: 11 refills | Status: DC

## 2020-09-22 MED ORDER — METOPROLOL TARTRATE 25 MG PO TABS: 25.0000 mg | ORAL_TABLET | Freq: Two times a day (BID) | ORAL | 1 refills | Status: DC

## 2020-09-22 MED ORDER — LORAZEPAM 1 MG PO TABS: 1.0000 mg | ORAL_TABLET | Freq: Three times a day (TID) | ORAL | 0 refills | Status: DC | PRN

## 2020-09-22 MED ORDER — INSULIN ASPART 100 UNIT/ML FLEXPEN: 5.0000 [IU] | PEN_INJECTOR | Freq: Three times a day (TID) | SUBCUTANEOUS | 6 refills | Status: DC

## 2020-09-22 MED ORDER — LANTUS SOLOSTAR 100 UNIT/ML ~~LOC~~ SOPN: 15.0000 [IU] | PEN_INJECTOR | Freq: Every day | SUBCUTANEOUS | 11 refills | Status: DC

## 2020-09-22 NOTE — Patient Instructions (Addendum)
  Medication Instructions:  Your physician recommends that you continue on your current medications as directed. Please refer to the Current Medication list given to you today. --If you need a refill on any your medications before your next appointment, please call your pharmacy first. If no refills are authorized on file call the office.--  Follow-Up: Your next appointment:   Your physician recommends that you schedule a follow-up appointment in: 61 WEEKS with Dr. de [place]am  Thanks for letting us be apart of your health journey!!  Primary Care and Sports Medicine   Dr. Arlina Robes [place]   We encourage you to activate your patient portal called "MyChart".  Sign up information is provided on this After Visit Summary.  MyChart is used to connect with patients for Virtual Visits (Telemedicine).  Patients are able to view lab/test results, encounter notes, upcoming appointments, etc.  Non-urgent messages can be sent to your provider as well. To learn more about what you can do with MyChart, please visit --  [email or web address].

## 2020-09-22 NOTE — Telephone Encounter (Signed)
Received Notification from [facility] (behavorial health) is not accepting any external referrals as of right now so the patient couldn't be seen  Sent new referrals to  [facility]  [phone]  Received confirmation that patient is scheduled to see Endo at [facility] on August 30 at 11 am

## 2020-09-22 NOTE — Progress Notes (Signed)
    Procedures performed today:    None.  Independent interpretation of notes and tests performed by another provider:   None.  Brief History, Exam, Impression, and Recommendations:     BP (!) 98/57   Pulse 74   Ht 5\' 4"  (1.626 m)   Wt 151 lb (68.5 kg)   LMP 06/18/2013   SpO2 98%   BMI 25.92 kg/m   Hypothyroidism Denies any current symptoms of hypothyroidism.  No overt signs of hypothyroidism Has been taking new dose of levothyroxine 112 mcg for about 4 weeks now Plan to check TSH in no sooner than 2 weeks Further dose adjustment of levothyroxine based upon TSH results  Type 2 diabetes mellitus with diabetic chronic kidney disease ([place]) Currently on insulin therapy as well as GLP-1 agonist, not controlled presently with most recent hemoglobin A1c at 9.1% Presently denies any issues with polyuria or polydipsia She will occasionally have some symptoms of hypoglycemia She admits to not eating regular meals Does not check her blood sugar regularly Discussed if CGM device would be some issue would be interested in, she indicated that she would be and has utilized 1 in the past.  Did not continue with advised to cost. Set patient up with sample of Dexcom device in office today, instructed on proper use, instructed on using smart phone to record readings Orders sent to pharmacy regarding supplies related to Dexcom, will need to follow-up to determine if this will be cost prohibitive or not  Anxiety and depression There is been difficulty finding appropriate provider for referral, either due to insurance coverage or patient declining in network providers Have arranged for patient to establish with local psychiatrist, encouraged to keep this appointment  Spent 30 minutes on this patient encounter, including preparation, chart review, face-to-face counseling with patient and coordination of care, and documentation of encounter   ___________________________________________ Izyk Marty  de [place], MD, ABFM, CAQSM Primary Care and [place]

## 2020-09-23 LAB — POCT UA - MICROALBUMIN
Creatinine, POC: 300 mg/dL
Microalbumin Ur, POC: 150 mg/L

## 2020-09-23 MED ORDER — DEXCOM G6 SENSOR MISC: 1.0000 | 11 refills | Status: DC

## 2020-09-23 MED ORDER — DEXCOM G6 TRANSMITTER MISC: 1.0000 | 11 refills | Status: DC

## 2020-09-27 ENCOUNTER — Ambulatory Visit: Payer: Medicaid Other | Admitting: "Endocrinology

## 2020-09-27 NOTE — Assessment & Plan Note (Signed)
Currently on insulin therapy as well as GLP-1 agonist, not controlled presently with most recent hemoglobin A1c at 9.1% Presently denies any issues with polyuria or polydipsia She will occasionally have some symptoms of hypoglycemia She admits to not eating regular meals Does not check her blood sugar regularly Discussed if CGM device would be some issue would be interested in, she indicated that she would be and has utilized 1 in the past.  Did not continue with advised to cost. Set patient up with sample of Dexcom device in office today, instructed on proper use, instructed on using smart phone to record readings Orders sent to pharmacy regarding supplies related to Dexcom, will need to follow-up to determine if this will be cost prohibitive or not

## 2020-09-27 NOTE — Assessment & Plan Note (Signed)
There is been difficulty finding appropriate provider for referral, either due to insurance coverage or patient declining in network providers Have arranged for patient to establish with local psychiatrist, encouraged to keep this appointment

## 2020-09-27 NOTE — Assessment & Plan Note (Signed)
Denies any current symptoms of hypothyroidism.  No overt signs of hypothyroidism Has been taking new dose of levothyroxine 112 mcg for about 4 weeks now Plan to check TSH in no sooner than 2 weeks Further dose adjustment of levothyroxine based upon TSH results

## 2020-10-02 ENCOUNTER — Encounter (HOSPITAL_BASED_OUTPATIENT_CLINIC_OR_DEPARTMENT_OTHER): Payer: Self-pay | Admitting: Family Medicine

## 2020-10-02 ENCOUNTER — Other Ambulatory Visit (HOSPITAL_BASED_OUTPATIENT_CLINIC_OR_DEPARTMENT_OTHER): Payer: Self-pay | Admitting: Family Medicine

## 2020-10-03 ENCOUNTER — Other Ambulatory Visit: Payer: Self-pay | Admitting: Family Medicine

## 2020-10-03 ENCOUNTER — Other Ambulatory Visit (HOSPITAL_BASED_OUTPATIENT_CLINIC_OR_DEPARTMENT_OTHER): Payer: Self-pay | Admitting: Family Medicine

## 2020-10-05 NOTE — Telephone Encounter (Signed)
Patient has transferred care to your office.  

## 2020-10-06 ENCOUNTER — Other Ambulatory Visit: Payer: Self-pay

## 2020-10-06 ENCOUNTER — Encounter (HOSPITAL_BASED_OUTPATIENT_CLINIC_OR_DEPARTMENT_OTHER): Payer: Self-pay | Admitting: Family Medicine

## 2020-10-06 ENCOUNTER — Ambulatory Visit (INDEPENDENT_AMBULATORY_CARE_PROVIDER_SITE_OTHER): Payer: Medicaid Other | Admitting: Family Medicine

## 2020-10-06 DIAGNOSIS — E039 Hypothyroidism, unspecified: Secondary | ICD-10-CM | POA: Diagnosis not present

## 2020-10-06 MED ORDER — ZOLPIDEM TARTRATE ER 12.5 MG PO TBCR: 12.5000 mg | EXTENDED_RELEASE_TABLET | Freq: Every evening | ORAL | 0 refills | Status: DC | PRN

## 2020-10-06 NOTE — Progress Notes (Signed)
   Subjective:    Patient ID: Kathryn Oconnell, female    DOB: 04-02-1966, 55 y.o.   MRN: [id number]  HPI    Review of Systems     Objective:   Physical Exam        Assessment & Plan:   Nurse visit for labs

## 2020-10-07 LAB — TSH: TSH: 0.116 u[IU]/mL — ABNORMAL LOW (ref 0.450–4.500)

## 2020-10-10 NOTE — Progress Notes (Deleted)
Office Visit Note  Patient: Kathryn Oconnell             Date of Birth: Aug 17, 1965           MRN: [id number]             PCP: de [place], Raymond J, MD Referring: Emelia Loron, NP Visit Date: 10/11/2020 Occupation: @GUAROCC @  Subjective:  No chief complaint on file.   History of Present Illness: Kathryn Oconnell is a 55 y.o. female with a history of chronic pain, diabetes, peripherla neuropathy, and CKD stage 3 here for lichen planus-like dermatitis.***   Activities of Daily Living:  Patient reports morning stiffness for *** {minute/hour:19697}.   Patient {ACTIONS;DENIES/REPORTS:21021675::"Denies"} nocturnal pain.  Difficulty dressing/grooming: {ACTIONS;DENIES/REPORTS:21021675::"Denies"} Difficulty climbing stairs: {ACTIONS;DENIES/REPORTS:21021675::"Denies"} Difficulty getting out of chair: {ACTIONS;DENIES/REPORTS:21021675::"Denies"} Difficulty using hands for taps, buttons, cutlery, and/or writing: {ACTIONS;DENIES/REPORTS:21021675::"Denies"}  No Rheumatology ROS completed.   PMFS History:  Patient Active Problem List   Diagnosis Date Noted   ASCUS of cervix with negative high risk HPV 05/14/2020   Anxiety and depression 05/04/2020   Encounter for screening fecal occult blood testing 05/04/2020   Encounter for gynecological examination with Papanicolaou smear of cervix 05/04/2020   Routine medical exam 05/04/2020   Fecal occult blood test positive 05/04/2020   Herpes labialis 02/10/2020   Nasal sore 02/10/2020   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   Tachycardia 06/19/2016   Chronic pain syndrome 07/28/2014   Vaginal dryness, menopausal 02/24/2014   Essential hypertension, benign 03/09/2013   Peripheral neuropathy 10/02/2012   Chronic constipation 07/31/2012   Seasonal allergies 07/31/2012   Genital herpes 06/15/2012   Migraines 06/09/2012   MDD (major depressive disorder) 06/09/2012   Mixed hyperlipidemia 03/23/2012   GERD (gastroesophageal reflux disease) 02/05/2012    CKD (chronic kidney disease) stage 3, GFR 30-59 ml/min (HCC) 02/05/2012   Insomnia 02/05/2012   Anxiety 02/05/2012   Lung nodule 01/22/2012   COPD (chronic obstructive pulmonary disease) ([place]) 01/22/2012   Dyspepsia 01/22/2012   Chronic back pain 07/04/2009   Hypothyroidism 06/28/2007   Type 2 diabetes mellitus with diabetic chronic kidney disease ([place]) 06/28/2007   Tobacco use 06/28/2007    Past Medical History:  Diagnosis Date   Anxiety    ASCUS of cervix with negative high risk HPV 05/14/2020   05/2020 ASCUS negative HPV, repeat pap in 3 years with HPV, 5 year CIN3+ risk is 0.27% per ASCCP guidelines    Chronic kidney disease    COPD (chronic obstructive pulmonary disease) ([place])    Depression    Diabetes mellitus without complication ([place])    Eczema    Graves disease    HSV-2 infection    Hyperlipidemia    Hypertension    Hypothyroid    Lung nodule    Menopausal vaginal dryness 02/24/2014   Migraines    Neuropathy    Tobacco use    Trouble in sleeping 06/25/2015    Family History  Problem Relation Age of Onset   Stroke Mother        x 3   Cancer Mother        breast    Thyroid disease Mother    Hyperlipidemia Mother    Cancer Father        stomach cancer    Hypertension Father    Heart attack Father 39   Gout Brother    Migraines Son    Heart attack Son    Hypertension Paternal Uncle    Heart disease Maternal Grandmother  Heart attack Maternal Grandmother    Heart disease Maternal Grandfather    Heart attack Maternal Grandfather    Cancer Paternal Grandmother    Diabetes Paternal Grandfather    Asthma Daughter    Past Surgical History:  Procedure Laterality Date   CESAREAN SECTION     x 2   FOOT SURGERY     5th digit left foot / Great toe, 2ND TOE   THYROID SURGERY     Social History   Social History Narrative   Not on file   Immunization History  Administered Date(s) Administered   Hepatitis B 11/11/1998, 12/16/1998   Hepatitis B, adult  06/30/2017, 08/20/2017   Influenza Whole 12/14/2006   Influenza,inj,Quad PF,6+ Mos 01/08/2015, 02/08/2016, 01/03/2017, 01/02/2018, 01/24/2019   Influenza-Unspecified 12/02/2012, 03/04/2020   MMR 07/14/1998   Moderna Sars-Covid-2 Vaccination 06/12/2019, 07/12/2019, 03/04/2020   Pneumococcal Polysaccharide-23 05/07/2006, 06/30/2017   Td 04/04/2001, 02/28/2010   Tdap 02/28/2010, 01/08/2015     Objective: Vital Signs: LMP 06/18/2013    Physical Exam   Musculoskeletal Exam: ***  CDAI Exam: CDAI Score: -- Patient Global: --; Provider Global: -- Swollen: --; Tender: -- Joint Exam 10/11/2020   No joint exam has been documented for this visit   There is currently no information documented on the homunculus. Go to the Rheumatology activity and complete the homunculus joint exam.  Investigation: No additional findings.  Imaging: No results found.  Recent Labs: Lab Results  Component Value Date   WBC 10.0 08/11/2020   HGB 14.1 08/11/2020   PLT 171 08/11/2020   NA 134 (L) 08/11/2020   K 4.5 08/11/2020   CL 103 08/11/2020   CO2 22 08/11/2020   GLUCOSE 125 (H) 08/11/2020   BUN 21 (H) 08/11/2020   CREATININE 1.82 (H) 08/11/2020   BILITOT 0.6 08/11/2020   ALKPHOS 73 08/11/2020   AST 27 08/11/2020   ALT 20 08/11/2020   PROT 7.3 08/11/2020   ALBUMIN 4.1 08/11/2020   CALCIUM 8.7 (L) 08/11/2020   GFRAA 47 (L) 10/08/2019    Speciality Comments: No specialty comments available.  Procedures:  No procedures performed Allergies: Erythromycin, Imitrex [sumatriptan], Sulfa antibiotics, and Topamax [topiramate]   Assessment / Plan:     Visit Diagnoses: No diagnosis found.  Orders: No orders of the defined types were placed in this encounter.  No orders of the defined types were placed in this encounter.   Face-to-face time spent with patient was *** minutes. Greater than 50% of time was spent in counseling and coordination of care.  Follow-Up Instructions: No follow-ups  on file.   Collier Salina, MD  Note - This record has been created using [employer].  Chart creation errors have been sought, but may not always  have been located. Such creation errors do not reflect on  the standard of medical care.

## 2020-10-11 ENCOUNTER — Ambulatory Visit: Payer: Medicaid Other | Admitting: Internal Medicine

## 2020-10-11 DIAGNOSIS — M25512 Pain in left shoulder: Secondary | ICD-10-CM | POA: Diagnosis not present

## 2020-10-11 DIAGNOSIS — Z79899 Other long term (current) drug therapy: Secondary | ICD-10-CM | POA: Diagnosis not present

## 2020-10-11 DIAGNOSIS — M545 Low back pain, unspecified: Secondary | ICD-10-CM | POA: Diagnosis not present

## 2020-10-11 DIAGNOSIS — G8929 Other chronic pain: Secondary | ICD-10-CM | POA: Diagnosis not present

## 2020-10-11 DIAGNOSIS — E1142 Type 2 diabetes mellitus with diabetic polyneuropathy: Secondary | ICD-10-CM | POA: Diagnosis not present

## 2020-10-12 ENCOUNTER — Telehealth (HOSPITAL_BASED_OUTPATIENT_CLINIC_OR_DEPARTMENT_OTHER): Payer: Self-pay

## 2020-10-12 MED ORDER — LEVOTHYROXINE SODIUM 100 MCG PO TABS: 100.0000 ug | ORAL_TABLET | Freq: Every day | ORAL | 1 refills | Status: DC

## 2020-10-12 NOTE — Telephone Encounter (Signed)
-----   Message from Raymond J de [place], MD sent at 10/12/2020 12:09 PM EDT ----- TSH is slightly low indicating that dose of levothyroxine will need to be decreased.  We will change this to 100 mcg daily.  Plan for repeat of TSH 6 to 8 weeks after taking new dose.

## 2020-10-12 NOTE — Telephone Encounter (Signed)
Patient is aware and agreeable to lab results and decreasing Levothyroxine  Will send RX for Levothyroxine 100 mg to [facility]

## 2020-10-14 ENCOUNTER — Ambulatory Visit: Payer: Medicaid Other | Admitting: Podiatry

## 2020-10-19 DIAGNOSIS — M25512 Pain in left shoulder: Secondary | ICD-10-CM | POA: Diagnosis not present

## 2020-10-19 DIAGNOSIS — M545 Low back pain, unspecified: Secondary | ICD-10-CM | POA: Diagnosis not present

## 2020-10-19 DIAGNOSIS — M25552 Pain in left hip: Secondary | ICD-10-CM | POA: Diagnosis not present

## 2020-10-19 DIAGNOSIS — M25551 Pain in right hip: Secondary | ICD-10-CM | POA: Diagnosis not present

## 2020-10-20 ENCOUNTER — Other Ambulatory Visit (HOSPITAL_BASED_OUTPATIENT_CLINIC_OR_DEPARTMENT_OTHER): Payer: Self-pay | Admitting: Family Medicine

## 2020-10-20 DIAGNOSIS — F331 Major depressive disorder, recurrent, moderate: Secondary | ICD-10-CM

## 2020-10-20 MED ORDER — SERTRALINE HCL 100 MG PO TABS: 150.0000 mg | ORAL_TABLET | Freq: Every day | ORAL | 0 refills | Status: DC

## 2020-10-21 ENCOUNTER — Ambulatory Visit: Payer: Medicaid Other | Admitting: Podiatry

## 2020-10-21 ENCOUNTER — Other Ambulatory Visit: Payer: Self-pay

## 2020-10-21 DIAGNOSIS — E1122 Type 2 diabetes mellitus with diabetic chronic kidney disease: Secondary | ICD-10-CM | POA: Diagnosis not present

## 2020-10-21 DIAGNOSIS — B351 Tinea unguium: Secondary | ICD-10-CM | POA: Diagnosis not present

## 2020-10-21 DIAGNOSIS — M79675 Pain in left toe(s): Secondary | ICD-10-CM | POA: Diagnosis not present

## 2020-10-21 DIAGNOSIS — M79674 Pain in right toe(s): Secondary | ICD-10-CM

## 2020-10-21 DIAGNOSIS — L84 Corns and callosities: Secondary | ICD-10-CM | POA: Diagnosis not present

## 2020-10-21 NOTE — Patient Instructions (Signed)
Look for urea 40% cream or ointment and apply to the thickened dry skin / calluses. This can be bought over the counter, at a pharmacy or online such as [employer].   Ask your pain management doctor about Qutenza for diabetic neuropathy   If you are interested in a consult with Dr Gean Quint for diabetic neuropathy spine stimulators please let me know

## 2020-10-26 NOTE — Progress Notes (Signed)
  Subjective:  Patient ID: Kathryn Oconnell, female    DOB: June 23, 1965,  MRN: [id number]  Chief Complaint  Patient presents with   Nail Problem    Diabetic nail trim     55 y.o. female presents with the above complaint. History confirmed with patient.  Also has calluses that are painful.  Nails are thickened and elongated.  Her A1c is 9.1%.  Objective:  Physical Exam: warm, good capillary refill, no trophic changes or ulcerative lesions, normal DP and PT pulses, and normal sensory exam.   Assessment:   1. Pain due to onychomycosis of toenails of both feet   2. Callus of foot   3. Type 2 diabetes mellitus with diabetic chronic kidney disease, unspecified CKD stage, unspecified whether long term insulin use ([place])      Plan:  Patient was evaluated and treated and all questions answered.  Patient educated on diabetes. Discussed proper diabetic foot care and discussed risks and complications of disease. Educated patient in depth on reasons to return to the office immediately should he/she discover anything concerning or new on the feet. All questions answered. Discussed proper shoes as well.    Return in about 1 year (around 10/21/2021), or diabetic foot exam or sooner if issues.

## 2020-11-03 ENCOUNTER — Encounter (HOSPITAL_BASED_OUTPATIENT_CLINIC_OR_DEPARTMENT_OTHER): Payer: Self-pay | Admitting: Family Medicine

## 2020-11-03 ENCOUNTER — Other Ambulatory Visit: Payer: Self-pay

## 2020-11-03 ENCOUNTER — Ambulatory Visit (HOSPITAL_BASED_OUTPATIENT_CLINIC_OR_DEPARTMENT_OTHER): Payer: Medicaid Other | Admitting: Family Medicine

## 2020-11-03 VITALS — BP 120/76 | HR 80 | Ht 64.0 in | Wt 146.0 lb

## 2020-11-03 DIAGNOSIS — E039 Hypothyroidism, unspecified: Secondary | ICD-10-CM

## 2020-11-03 DIAGNOSIS — E1122 Type 2 diabetes mellitus with diabetic chronic kidney disease: Secondary | ICD-10-CM | POA: Diagnosis not present

## 2020-11-03 DIAGNOSIS — I10 Essential (primary) hypertension: Secondary | ICD-10-CM

## 2020-11-03 DIAGNOSIS — F331 Major depressive disorder, recurrent, moderate: Secondary | ICD-10-CM

## 2020-11-03 NOTE — Assessment & Plan Note (Signed)
Blood pressure at goal in office today Continue with current regimen Recommend following DASH diet, engaging in physical activity as able to Continue to monitor blood pressure in the office

## 2020-11-03 NOTE — Progress Notes (Signed)
    Procedures performed today:    None.  Independent interpretation of notes and tests performed by another provider:   None.  Brief History, Exam, Impression, and Recommendations:     BP 120/76   Pulse 80   Ht '5\' 4"'$  (1.626 m)   Wt 146 lb (66.2 kg)   LMP 06/18/2013   SpO2 96%   BMI 25.06 kg/m   Essential hypertension, benign Blood pressure at goal in office today Continue with current regimen Recommend following DASH diet, engaging in physical activity as able to Continue to monitor blood pressure in the office  Hypothyroidism Most recent TSH was slightly low, dose was adjusted at that time, she has been taking new dose of levothyroxine for about 4 weeks Plan for recheck of TSH in about 2 to 3 weeks with levothyroxine to be adjusted as needed based on lab results Clinically patient appears euthyroid  Type 2 diabetes mellitus with diabetic chronic kidney disease ([place]) Doing well currently, has been utilizing Dexcom with good results Reviewed readings and report on patient's smart phone while in office today.  Estimated A1c on 30-day report is about 7%.  About 70% of readings are within target range with about 25% of readings within the high range. Infrequent hypoglycemic readings. Patient was found utilizing Dexcom beneficial, particularly related to allow her to see which foods and beverages will most notably raise her blood sugar Continue with use of Dexcom Continue with current medication regimen Plan to check hemoglobin A1c in about 2 to 3 weeks  MDD (major depressive disorder) Has established with new psychiatrist Feels that this has been going well, has been started on Wellbutrin Psychiatry did discuss risks and side effects associated with long-term benzodiazepine use and is working with patient to gradually wean this medication Encouraged to continue follow-up with psychiatry Patient in office appears to be in better spirits with improved mood and affect  Plan  for labs to be checked in about 2 to 3 weeks with follow-up with me in about 9 weeks.  ___________________________________________ Dyana Magner de [place], MD, ABFM, CAQSM Primary Care and [place]

## 2020-11-03 NOTE — Patient Instructions (Signed)
  Medication Instructions:  Your physician recommends that you continue on your current medications as directed. Please refer to the Current Medication list given to you today. --If you need a refill on any your medications before your next appointment, please call your pharmacy first. If no refills are authorized on file call the office.--  Lab Work: Your physician has recommended that you have lab work in 3 WEEKS - TSH and A1C If you have labs (blood work) drawn today and your tests are completely normal, you will receive your results via [place] a phone call from our staff.  Please ensure you check your voicemail in the event that you authorized detailed messages to be left on a delegated number. If you have any lab test that is abnormal or we need to change your treatment, we will call you to review the results.  Follow-Up: Your next appointment:   Your physician recommends that you schedule a follow-up appointment in: 27 WEEKS with Dr. de [place]  Thanks for letting us be apart of your health journey!!  Primary Care and Sports Medicine   Dr. Arlina Robes [place]   We encourage you to activate your patient portal called "MyChart".  Sign up information is provided on this After Visit Summary.  MyChart is used to connect with patients for Virtual Visits (Telemedicine).  Patients are able to view lab/test results, encounter notes, upcoming appointments, etc.  Non-urgent messages can be sent to your provider as well. To learn more about what you can do with MyChart, please visit --  [email or web address].

## 2020-11-03 NOTE — Assessment & Plan Note (Signed)
Doing well currently, has been utilizing Dexcom with good results Reviewed readings and report on patient's smart phone while in office today.  Estimated A1c on 30-day report is about 7%.  About 70% of readings are within target range with about 25% of readings within the high range. Infrequent hypoglycemic readings. Patient was found utilizing Dexcom beneficial, particularly related to allow her to see which foods and beverages will most notably raise her blood sugar Continue with use of Dexcom Continue with current medication regimen Plan to check hemoglobin A1c in about 2 to 3 weeks

## 2020-11-03 NOTE — Assessment & Plan Note (Signed)
Has established with new psychiatrist Feels that this has been going well, has been started on Wellbutrin Psychiatry did discuss risks and side effects associated with long-term benzodiazepine use and is working with patient to gradually wean this medication Encouraged to continue follow-up with psychiatry Patient in office appears to be in better spirits with improved mood and affect

## 2020-11-03 NOTE — Assessment & Plan Note (Signed)
Most recent TSH was slightly low, dose was adjusted at that time, she has been taking new dose of levothyroxine for about 4 weeks Plan for recheck of TSH in about 2 to 3 weeks with levothyroxine to be adjusted as needed based on lab results Clinically patient appears euthyroid

## 2020-11-04 ENCOUNTER — Telehealth (HOSPITAL_BASED_OUTPATIENT_CLINIC_OR_DEPARTMENT_OTHER): Payer: Self-pay

## 2020-11-04 NOTE — Telephone Encounter (Signed)
Received a fax from [facility] requesting refill on Ativan Called patient to inquire how much medication she has left and to see if refill is needed at this point Patient is aware that Psych needs to be authorizing future refills Left voicemail for patient to call the office to discuss.

## 2020-11-10 ENCOUNTER — Other Ambulatory Visit (HOSPITAL_BASED_OUTPATIENT_CLINIC_OR_DEPARTMENT_OTHER): Payer: Self-pay | Admitting: Family Medicine

## 2020-11-10 MED ORDER — VALACYCLOVIR HCL 1 G PO TABS: 1000.0000 mg | ORAL_TABLET | Freq: Every day | ORAL | 0 refills | Status: DC | PRN

## 2020-11-10 MED ORDER — LORAZEPAM 1 MG PO TABS: 1.0000 mg | ORAL_TABLET | Freq: Three times a day (TID) | ORAL | 0 refills | Status: DC | PRN

## 2020-11-14 NOTE — Progress Notes (Deleted)
Office Visit Note  Patient: Kathryn Oconnell             Date of Birth: [birth year]           MRN: [id number]             PCP: de [place], Raymond J, MD Referring: Emelia Loron, NP Visit Date: 11/15/2020 Occupation: @GUAROCC @  Subjective:  No chief complaint on file.   History of Present Illness: Kathryn Oconnell is a 55 y.o. 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 This is followed with dermatology clinic. She suffers from chronic back pain and muscle spasticity treated with low dose opioid medication and lorazepam. She also takes Lyrica with good adherence for depression and chronic pain.***   Labs reviewed 06/2020 eGFR 43   Activities of Daily Living:  Patient reports morning stiffness for *** {minute/hour:19697}.   Patient {ACTIONS;DENIES/REPORTS:21021675::"Denies"} nocturnal pain.  Difficulty dressing/grooming: {ACTIONS;DENIES/REPORTS:21021675::"Denies"} Difficulty climbing stairs: {ACTIONS;DENIES/REPORTS:21021675::"Denies"} Difficulty getting out of chair: {ACTIONS;DENIES/REPORTS:21021675::"Denies"} Difficulty using hands for taps, buttons, cutlery, and/or writing: {ACTIONS;DENIES/REPORTS:21021675::"Denies"}  No Rheumatology ROS completed.   PMFS History:  Patient Active Problem List   Diagnosis Date Noted   ASCUS of cervix with negative high risk HPV 05/14/2020   Anxiety and depression 05/04/2020   Encounter for screening fecal occult blood testing 05/04/2020   Encounter for gynecological examination with Papanicolaou smear of cervix 05/04/2020   Routine medical exam 05/04/2020   Fecal occult blood test positive 05/04/2020   Herpes labialis 02/10/2020   Nasal sore 02/10/2020   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   Tachycardia 06/19/2016   Chronic pain syndrome 07/28/2014   Vaginal dryness, menopausal 02/24/2014   Essential hypertension, benign 03/09/2013   Peripheral neuropathy 10/02/2012   Chronic  constipation 07/31/2012   Seasonal allergies 07/31/2012   Genital herpes 06/15/2012   Migraines 06/09/2012   MDD (major depressive disorder) 06/09/2012   Mixed hyperlipidemia 03/23/2012   GERD (gastroesophageal reflux disease) 02/05/2012   CKD (chronic kidney disease) stage 3, GFR 30-59 ml/min (HCC) 02/05/2012   Insomnia 02/05/2012   Anxiety 02/05/2012   Lung nodule 01/22/2012   COPD (chronic obstructive pulmonary disease) ([place]) 01/22/2012   Dyspepsia 01/22/2012   Chronic back pain 07/04/2009   Hypothyroidism 06/28/2007   Type 2 diabetes mellitus with diabetic chronic kidney disease ([place]) 06/28/2007   Tobacco use 06/28/2007    Past Medical History:  Diagnosis Date   Anxiety    ASCUS of cervix with negative high risk HPV 05/14/2020   05/2020 ASCUS negative HPV, repeat pap in 3 years with HPV, 5 year CIN3+ risk is 0.27% per ASCCP guidelines    Chronic kidney disease    COPD (chronic obstructive pulmonary disease) ([place])    Depression    Diabetes mellitus without complication ([place])    Eczema    Graves disease    HSV-2 infection    Hyperlipidemia    Hypertension    Hypothyroid    Lung nodule    Menopausal vaginal dryness 02/24/2014   Migraines    Neuropathy    Tobacco use    Trouble in sleeping 06/25/2015    Family History  Problem Relation Age of Onset   Stroke Mother        x 3   Cancer Mother        breast    Thyroid disease Mother    Hyperlipidemia Mother    Cancer Father        stomach cancer  Hypertension Father    Heart attack Father 16   Gout Brother    Migraines Son    Heart attack Son    Hypertension Paternal Uncle    Heart disease Maternal Grandmother    Heart attack Maternal Grandmother    Heart disease Maternal Grandfather    Heart attack Maternal Grandfather    Cancer Paternal Grandmother    Diabetes Paternal Grandfather    Asthma Daughter    Past Surgical History:  Procedure Laterality Date   CESAREAN SECTION     x 2   FOOT SURGERY     5th  digit left foot / Great toe, 2ND TOE   THYROID SURGERY     Social History   Social History Narrative   Not on file   Immunization History  Administered Date(s) Administered   Hepatitis B 11/11/1998, 12/16/1998   Hepatitis B, adult 06/30/2017, 08/20/2017   Influenza Whole 12/14/2006   Influenza,inj,Quad PF,6+ Mos 01/08/2015, 02/08/2016, 01/03/2017, 01/02/2018, 01/24/2019   Influenza-Unspecified 12/02/2012, 03/04/2020   MMR 07/14/1998   Moderna Sars-Covid-2 Vaccination 06/12/2019, 07/12/2019, 03/04/2020   Pneumococcal Polysaccharide-23 05/07/2006, 06/30/2017   Td 04/04/2001, 02/28/2010   Tdap 02/28/2010, 01/08/2015     Objective: Vital Signs: LMP 06/18/2013    Physical Exam   Musculoskeletal Exam: ***  CDAI Exam: CDAI Score: -- Patient Global: --; Provider Global: -- Swollen: --; Tender: -- Joint Exam 11/15/2020   No joint exam has been documented for this visit   There is currently no information documented on the homunculus. Go to the Rheumatology activity and complete the homunculus joint exam.  Investigation: No additional findings.  Imaging: No results found.  Recent Labs: Lab Results  Component Value Date   WBC 10.0 08/11/2020   HGB 14.1 08/11/2020   PLT 171 08/11/2020   NA 134 (L) 08/11/2020   K 4.5 08/11/2020   CL 103 08/11/2020   CO2 22 08/11/2020   GLUCOSE 125 (H) 08/11/2020   BUN 21 (H) 08/11/2020   CREATININE 1.82 (H) 08/11/2020   BILITOT 0.6 08/11/2020   ALKPHOS 73 08/11/2020   AST 27 08/11/2020   ALT 20 08/11/2020   PROT 7.3 08/11/2020   ALBUMIN 4.1 08/11/2020   CALCIUM 8.7 (L) 08/11/2020   GFRAA 47 (L) 10/08/2019    Speciality Comments: No specialty comments available.  Procedures:  No procedures performed Allergies: Erythromycin, Imitrex [sumatriptan], Sulfa antibiotics, and Topamax [topiramate]   Assessment / Plan:     Visit Diagnoses: No diagnosis found.  Orders: No orders of the defined types were placed in this  encounter.  No orders of the defined types were placed in this encounter.   Face-to-face time spent with patient was *** minutes. Greater than 50% of time was spent in counseling and coordination of care.  Follow-Up Instructions: No follow-ups on file.   Collier Salina, MD  Note - This record has been created using [employer].  Chart creation errors have been sought, but may not always  have been located. Such creation errors do not reflect on  the standard of medical care.

## 2020-11-15 ENCOUNTER — Ambulatory Visit: Payer: Medicaid Other | Admitting: Internal Medicine

## 2020-11-24 ENCOUNTER — Other Ambulatory Visit: Payer: Self-pay

## 2020-11-24 ENCOUNTER — Ambulatory Visit (INDEPENDENT_AMBULATORY_CARE_PROVIDER_SITE_OTHER): Payer: Medicaid Other | Admitting: Family Medicine

## 2020-11-24 DIAGNOSIS — E1122 Type 2 diabetes mellitus with diabetic chronic kidney disease: Secondary | ICD-10-CM

## 2020-11-24 DIAGNOSIS — E039 Hypothyroidism, unspecified: Secondary | ICD-10-CM

## 2020-11-24 NOTE — Progress Notes (Signed)
Patient presents for A1C and TSH labs Patient tolerated phlebotomy well No concerns or complaints

## 2020-11-25 ENCOUNTER — Telehealth (HOSPITAL_BASED_OUTPATIENT_CLINIC_OR_DEPARTMENT_OTHER): Payer: Self-pay

## 2020-11-25 LAB — HEMOGLOBIN A1C
Est. average glucose Bld gHb Est-mCnc: 186 mg/dL
Hgb A1c MFr Bld: 8.1 % — ABNORMAL HIGH (ref 4.8–5.6)

## 2020-11-25 LAB — TSH: TSH: 0.357 u[IU]/mL — ABNORMAL LOW (ref 0.450–4.500)

## 2020-11-25 LAB — SPECIMEN STATUS REPORT

## 2020-11-25 MED ORDER — LEVOTHYROXINE SODIUM 88 MCG PO TABS: 88.0000 ug | ORAL_TABLET | Freq: Every day | ORAL | 1 refills | Status: DC

## 2020-11-25 NOTE — Telephone Encounter (Signed)
-----   Message from Raymond J de [place], MD sent at 11/25/2020  8:23 AM EDT ----- TSH still slightly below normal range.  Recommend slight decrease in dose of levothyroxine and repeating labs in 6 to 8 weeks.  New dose of levothyroxine will be 88 mcg daily.  Hemoglobin A1c has improved but still above goal at 8.1%.  We can discuss management further at follow-up visit.  Continue with maintaining blood sugar log.

## 2020-11-25 NOTE — Telephone Encounter (Signed)
Per DPR left detailed message of lab results and recommendation on mobile voicemail New dose of Levothyroxine sent to pharmacy on file Informed/Reiterated follow up appt scheduled in Oct and to keep appt. Instructed patient to contact the office with any questions or concerns

## 2020-12-01 ENCOUNTER — Other Ambulatory Visit (HOSPITAL_BASED_OUTPATIENT_CLINIC_OR_DEPARTMENT_OTHER): Payer: Self-pay

## 2020-12-01 NOTE — Telephone Encounter (Signed)
Received refill request form pharmacy for promethazine Called patient to discuss the need for the medication and if she is currently having any issues Patient didn't answer...Marland KitchenLMTCB

## 2020-12-20 ENCOUNTER — Telehealth (HOSPITAL_BASED_OUTPATIENT_CLINIC_OR_DEPARTMENT_OTHER): Payer: Self-pay

## 2020-12-20 MED ORDER — SEMAGLUTIDE (1 MG/DOSE) 4 MG/3ML ~~LOC~~ SOPN: 1.0000 mg | PEN_INJECTOR | SUBCUTANEOUS | 2 refills | Status: DC

## 2020-12-20 NOTE — Telephone Encounter (Signed)
Received a request from Kathryn Oconnell for refill authorization of ozempic Will authorize until next follow up.

## 2020-12-24 ENCOUNTER — Ambulatory Visit: Payer: Medicaid Other | Admitting: Internal Medicine

## 2020-12-24 NOTE — Progress Notes (Deleted)
Office Visit Note  Patient: Kathryn Oconnell             Date of Birth: [birth year]           MRN: [id number]             PCP: de [place], Raymond J, MD Referring: Emelia Loron, NP Visit Date: 12/24/2020 Occupation: _0 @  Subjective:  No chief complaint on file.   History of Present Illness: Kathryn Oconnell is a 55 y.o. female here for lichen planus dermatitis skin rashes.***   Activities of Daily Living:  Patient reports morning stiffness for *** {minute/hour:19697}.   Patient {ACTIONS;DENIES/REPORTS:21021675::"Denies"} nocturnal pain.  Difficulty dressing/grooming: {ACTIONS;DENIES/REPORTS:21021675::"Denies"} Difficulty climbing stairs: {ACTIONS;DENIES/REPORTS:21021675::"Denies"} Difficulty getting out of chair: {ACTIONS;DENIES/REPORTS:21021675::"Denies"} Difficulty using hands for taps, buttons, cutlery, and/or writing: {ACTIONS;DENIES/REPORTS:21021675::"Denies"}  No Rheumatology ROS completed.   PMFS History:  Patient Active Problem List   Diagnosis Date Noted   ASCUS of cervix with negative high risk HPV 05/14/2020   Anxiety and depression 05/04/2020   Encounter for screening fecal occult blood testing 05/04/2020   Encounter for gynecological examination with Papanicolaou smear of cervix 05/04/2020   Routine medical exam 05/04/2020   Fecal occult blood test positive 05/04/2020   Herpes labialis 02/10/2020   Nasal sore 02/10/2020   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   Tachycardia 06/19/2016   Chronic pain syndrome 07/28/2014   Vaginal dryness, menopausal 02/24/2014   Essential hypertension, benign 03/09/2013   Peripheral neuropathy 10/02/2012   Chronic constipation 07/31/2012   Seasonal allergies 07/31/2012   Genital herpes 06/15/2012   Migraines 06/09/2012   MDD (major depressive disorder) 06/09/2012   Mixed hyperlipidemia 03/23/2012   GERD (gastroesophageal reflux disease) 02/05/2012   CKD (chronic kidney disease) stage 3, GFR 30-59 ml/min (HCC) 02/05/2012    Insomnia 02/05/2012   Anxiety 02/05/2012   Lung nodule 01/22/2012   COPD (chronic obstructive pulmonary disease) ([place]) 01/22/2012   Dyspepsia 01/22/2012   Chronic back pain 07/04/2009   Hypothyroidism 06/28/2007   Type 2 diabetes mellitus with diabetic chronic kidney disease ([place]) 06/28/2007   Tobacco use 06/28/2007    Past Medical History:  Diagnosis Date   Anxiety    ASCUS of cervix with negative high risk HPV 05/14/2020   05/2020 ASCUS negative HPV, repeat pap in 3 years with HPV, 5 year CIN3+ risk is 0.27% per ASCCP guidelines    Chronic kidney disease    COPD (chronic obstructive pulmonary disease) ([place])    Depression    Diabetes mellitus without complication ([place])    Eczema    Graves disease    HSV-2 infection    Hyperlipidemia    Hypertension    Hypothyroid    Lung nodule    Menopausal vaginal dryness 02/24/2014   Migraines    Neuropathy    Tobacco use    Trouble in sleeping 06/25/2015    Family History  Problem Relation Age of Onset   Stroke Mother        x 3   Cancer Mother        breast    Thyroid disease Mother    Hyperlipidemia Mother    Cancer Father        stomach cancer    Hypertension Father    Heart attack Father 62   Gout Brother    Migraines Son    Heart attack Son    Hypertension Paternal Uncle    Heart disease Maternal Grandmother    Heart attack Maternal Grandmother    Heart disease  Maternal Grandfather    Heart attack Maternal Grandfather    Cancer Paternal Grandmother    Diabetes Paternal Grandfather    Asthma Daughter    Past Surgical History:  Procedure Laterality Date   CESAREAN SECTION     x 2   FOOT SURGERY     5th digit left foot / Great toe, 2ND TOE   THYROID SURGERY     Social History   Social History Narrative   Not on file   Immunization History  Administered Date(s) Administered   Hepatitis B 11/11/1998, 12/16/1998   Hepatitis B, adult 06/30/2017, 08/20/2017   Influenza Whole 12/14/2006   Influenza,inj,Quad  PF,6+ Mos 01/08/2015, 02/08/2016, 01/03/2017, 01/02/2018, 01/24/2019   Influenza-Unspecified 12/02/2012, 03/04/2020   MMR 07/14/1998   Moderna Sars-Covid-2 Vaccination 06/12/2019, 07/12/2019, 03/04/2020   Pneumococcal Polysaccharide-23 05/07/2006, 06/30/2017   Td 04/04/2001, 02/28/2010   Tdap 02/28/2010, 01/08/2015     Objective: Vital Signs: LMP 06/18/2013    Physical Exam   Musculoskeletal Exam: ***  CDAI Exam: CDAI Score: -- Patient Global: --; Provider Global: -- Swollen: --; Tender: -- Joint Exam 12/24/2020   No joint exam has been documented for this visit   There is currently no information documented on the homunculus. Go to the Rheumatology activity and complete the homunculus joint exam.  Investigation: No additional findings.  Imaging: No results found.  Recent Labs: Lab Results  Component Value Date   WBC 10.0 08/11/2020   HGB 14.1 08/11/2020   PLT 171 08/11/2020   NA 134 (L) 08/11/2020   K 4.5 08/11/2020   CL 103 08/11/2020   CO2 22 08/11/2020   GLUCOSE 125 (H) 08/11/2020   BUN 21 (H) 08/11/2020   CREATININE 1.82 (H) 08/11/2020   BILITOT 0.6 08/11/2020   ALKPHOS 73 08/11/2020   AST 27 08/11/2020   ALT 20 08/11/2020   PROT 7.3 08/11/2020   ALBUMIN 4.1 08/11/2020   CALCIUM 8.7 (L) 08/11/2020   GFRAA 47 (L) 10/08/2019    Speciality Comments: No specialty comments available.  Procedures:  No procedures performed Allergies: Erythromycin, Imitrex [sumatriptan], Sulfa antibiotics, and Topamax [topiramate]   Assessment / Plan:     Visit Diagnoses: No diagnosis found.  Orders: No orders of the defined types were placed in this encounter.  No orders of the defined types were placed in this encounter.   Face-to-face time spent with patient was *** minutes. Greater than 50% of time was spent in counseling and coordination of care.  Follow-Up Instructions: No follow-ups on file.   Collier Salina, MD  Note - This record has been created  using [employer].  Chart creation errors have been sought, but may not always  have been located. Such creation errors do not reflect on  the standard of medical care.

## 2020-12-29 ENCOUNTER — Encounter (INDEPENDENT_AMBULATORY_CARE_PROVIDER_SITE_OTHER): Payer: Self-pay

## 2021-01-05 ENCOUNTER — Telehealth (HOSPITAL_BASED_OUTPATIENT_CLINIC_OR_DEPARTMENT_OTHER): Payer: Self-pay

## 2021-01-05 DIAGNOSIS — L308 Other specified dermatitis: Secondary | ICD-10-CM

## 2021-01-05 DIAGNOSIS — L819 Disorder of pigmentation, unspecified: Secondary | ICD-10-CM

## 2021-01-05 NOTE — Telephone Encounter (Signed)
Patient walked in requesting a referral to a dermatologist. Patient requested an [place] American provider if possible in the [place].  Also requested a rheumatologist referral. Patient dismissed from seeing a rheumatologist within [facility] due to missed appts.

## 2021-01-06 ENCOUNTER — Other Ambulatory Visit: Payer: Self-pay

## 2021-01-06 ENCOUNTER — Ambulatory Visit (HOSPITAL_BASED_OUTPATIENT_CLINIC_OR_DEPARTMENT_OTHER): Payer: Medicaid Other | Admitting: Family Medicine

## 2021-01-06 VITALS — BP 109/62 | HR 78 | Ht 64.0 in | Wt 140.8 lb

## 2021-01-06 DIAGNOSIS — E039 Hypothyroidism, unspecified: Secondary | ICD-10-CM

## 2021-01-06 DIAGNOSIS — R202 Paresthesia of skin: Secondary | ICD-10-CM

## 2021-01-06 DIAGNOSIS — R200 Anesthesia of skin: Secondary | ICD-10-CM | POA: Diagnosis not present

## 2021-01-06 NOTE — Assessment & Plan Note (Signed)
Prior TSH was found to be slightly below normal limit, levothyroxine was adjusted down Currently, patient feels that her thyroid is likely within normal limits due to current symptoms feeling stable, particularly reports that she feels that her hair has been growing normally It has been about 6 weeks since new dose of levothyroxine, will check TSH today

## 2021-01-06 NOTE — Progress Notes (Signed)
    Procedures performed today:    None.  Independent interpretation of notes and tests performed by another provider:   None.  Brief History, Exam, Impression, and Recommendations:    BP 109/62   Pulse 78   Ht 5\' 4"  (1.626 m)   Wt 140 lb 12.8 oz (63.9 kg)   LMP 06/18/2013   SpO2 98%   BMI 24.17 kg/m   Hypothyroidism Prior TSH was found to be slightly below normal limit, levothyroxine was adjusted down Currently, patient feels that her thyroid is likely within normal limits due to current symptoms feeling stable, particularly reports that she feels that her hair has been growing normally It has been about 6 weeks since new dose of levothyroxine, will check TSH today  Numbness and tingling in left hand Has had intermittent issues, more persistent recently.  Numbness and tingling tends to be over fourth and fifth digit of left hand primarily.  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

## 2021-01-06 NOTE — Assessment & Plan Note (Signed)
Has had intermittent issues, more persistent recently.  Numbness and tingling tends to be over fourth and fifth digit of left hand primarily.  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

## 2021-01-07 LAB — TSH: TSH: 1.62 u[IU]/mL (ref 0.450–4.500)

## 2021-01-12 ENCOUNTER — Telehealth (HOSPITAL_BASED_OUTPATIENT_CLINIC_OR_DEPARTMENT_OTHER): Payer: Self-pay

## 2021-01-12 ENCOUNTER — Ambulatory Visit (HOSPITAL_BASED_OUTPATIENT_CLINIC_OR_DEPARTMENT_OTHER): Payer: Medicaid Other | Admitting: Family Medicine

## 2021-01-12 NOTE — Telephone Encounter (Signed)
-----   Message from Raymond J de [place], MD sent at 01/07/2021  1:10 PM EDT ----- TSH is within normal limits which is good, continue with current dose of levothyroxine

## 2021-01-12 NOTE — Telephone Encounter (Signed)
Patient is aware and agreeable to lab results and recommendations

## 2021-01-13 ENCOUNTER — Other Ambulatory Visit (HOSPITAL_BASED_OUTPATIENT_CLINIC_OR_DEPARTMENT_OTHER): Payer: Self-pay

## 2021-01-13 MED ORDER — DEXCOM G6 SENSOR MISC: 1.0000 | 11 refills | Status: DC

## 2021-01-13 NOTE — Telephone Encounter (Signed)
Patient called and left voicemail that dexcom g6 sensor is set to expire on 10/15 Will send refill to pharmacy

## 2021-01-20 ENCOUNTER — Other Ambulatory Visit: Payer: Self-pay | Admitting: Nephrology

## 2021-01-20 DIAGNOSIS — N183 Chronic kidney disease, stage 3 unspecified: Secondary | ICD-10-CM

## 2021-02-08 LAB — HM COLONOSCOPY

## 2021-02-14 ENCOUNTER — Other Ambulatory Visit: Payer: Self-pay | Admitting: Dermatology

## 2021-02-14 DIAGNOSIS — R21 Rash and other nonspecific skin eruption: Secondary | ICD-10-CM

## 2021-02-15 ENCOUNTER — Other Ambulatory Visit: Payer: Medicaid Other

## 2021-02-17 ENCOUNTER — Ambulatory Visit (HOSPITAL_BASED_OUTPATIENT_CLINIC_OR_DEPARTMENT_OTHER): Payer: Medicaid Other | Admitting: Family Medicine

## 2021-02-17 ENCOUNTER — Encounter (HOSPITAL_BASED_OUTPATIENT_CLINIC_OR_DEPARTMENT_OTHER): Payer: Self-pay

## 2021-02-23 ENCOUNTER — Other Ambulatory Visit: Payer: Medicaid Other

## 2021-02-23 ENCOUNTER — Other Ambulatory Visit: Payer: Self-pay | Admitting: Family Medicine

## 2021-02-23 ENCOUNTER — Ambulatory Visit (HOSPITAL_BASED_OUTPATIENT_CLINIC_OR_DEPARTMENT_OTHER): Payer: Medicaid Other | Admitting: Family Medicine

## 2021-03-02 ENCOUNTER — Ambulatory Visit
Admission: RE | Admit: 2021-03-02 | Discharge: 2021-03-02 | Disposition: A | Discharge status: Home / Self Care | Payer: Medicaid Other | Source: Ambulatory Visit | Attending: Nephrology | Admitting: Nephrology

## 2021-03-02 DIAGNOSIS — N183 Chronic kidney disease, stage 3 unspecified: Secondary | ICD-10-CM

## 2021-03-05 ENCOUNTER — Other Ambulatory Visit: Payer: Self-pay | Admitting: Family Medicine

## 2021-03-07 ENCOUNTER — Encounter (HOSPITAL_BASED_OUTPATIENT_CLINIC_OR_DEPARTMENT_OTHER): Payer: Self-pay | Admitting: Family Medicine

## 2021-03-08 ENCOUNTER — Encounter: Payer: Self-pay | Admitting: Dermatology

## 2021-03-08 ENCOUNTER — Other Ambulatory Visit: Payer: Self-pay

## 2021-03-08 ENCOUNTER — Ambulatory Visit (INDEPENDENT_AMBULATORY_CARE_PROVIDER_SITE_OTHER): Payer: Medicaid Other | Admitting: Dermatology

## 2021-03-08 DIAGNOSIS — L988 Other specified disorders of the skin and subcutaneous tissue: Secondary | ICD-10-CM

## 2021-03-08 DIAGNOSIS — L668 Other cicatricial alopecia: Secondary | ICD-10-CM | POA: Diagnosis not present

## 2021-03-08 DIAGNOSIS — L659 Nonscarring hair loss, unspecified: Secondary | ICD-10-CM

## 2021-03-08 DIAGNOSIS — L820 Inflamed seborrheic keratosis: Secondary | ICD-10-CM

## 2021-03-08 DIAGNOSIS — L438 Other lichen planus: Secondary | ICD-10-CM

## 2021-03-08 DIAGNOSIS — L2489 Irritant contact dermatitis due to other agents: Secondary | ICD-10-CM

## 2021-03-08 DIAGNOSIS — L905 Scar conditions and fibrosis of skin: Secondary | ICD-10-CM

## 2021-03-08 MED ORDER — KETOCONAZOLE 2 % EX CREA: 1.0000 "application " | TOPICAL_CREAM | Freq: Two times a day (BID) | CUTANEOUS | 0 refills | Status: AC

## 2021-03-08 MED ORDER — TACROLIMUS 0.1 % EX OINT
TOPICAL_OINTMENT | CUTANEOUS | 6 refills | Status: DC
Start: 2021-03-08 — End: 2022-01-24

## 2021-03-08 NOTE — Progress Notes (Signed)
Follow-Up Visit   Subjective  Kathryn Oconnell is a 55 y.o. female who presents for the following: Rash (6 months f/u rash on the face ). Pt c/o growth on the right side that will not go away.  The patient has spots and lesions to be evaluated, some may be new or changing and the patient has concerns that these could be cancer.  She is also bothered by dark spots at cheek, legs and by hair breakage.  The following portions of the chart were reviewed this encounter and updated as appropriate:   Tobacco  Allergies  Meds  Problems  Med Hx  Surg Hx  Fam Hx      Review of Systems:  No other skin or systemic complaints except as noted in HPI or Assessment and Plan.  Objective  Well appearing patient in no apparent distress; mood and affect are within normal limits.  A focused examination was performed including face, scalp, right flank, lower legs, groin. Relevant physical exam findings are noted in the Assessment and Plan.  Right Flank Erythematous keratotic or waxy stuck-on papule or plaque.   Right Abdomen (side) - Upper Dyspigmented smooth macule or patch.   left temporal scalp Hair breakage and prominent follicles  Head - Anterior (Face) Rhytides and volume loss.   vagina Very minimal erythema   face Hyperpigmented patches left cheek, pretibia   Assessment & Plan  Inflamed seborrheic keratosis Right Flank  Symptomatic  Benign-appearing Discussed benign etiology and prognosis.  Reviewed risk of scar from treatment      Destruction of lesion - Right Flank  Destruction method: electrodesiccation and curettage   Destruction method comment:  Curettage removal with light cautery to base for hemostasis Informed consent: discussed and consent obtained   Timeout:  patient name, date of birth, surgical site, and procedure verified Anesthesia: the lesion was anesthetized in a standard fashion   Anesthetic:  1% lidocaine w/ epinephrine 1-100,000 buffered w/  8.4% NaHCO3 Curettage performed in three different directions: Yes   Electrodesiccation performed over the curetted area: Yes   Curettage cycles:  1 Hemostasis achieved with:  electrodesiccation Outcome: patient tolerated procedure well with no complications   Post-procedure details: sterile dressing applied and wound care instructions given   Dressing type: petrolatum    Scar Right Abdomen (side) - Upper  Recommend Serica moisturizing scar formula cream every night or [facility] brand or Mederma silicone scar sheet every night for the first year after a scar appears to help with scar remodeling if desired. Scars remodel on their own for a full year.   Alopecia left temporal scalp  R/O early LPP vs  CCA  or other  Patient with increase breakage and prominent follicles at scalp   Skin / nail biopsy - left temporal scalp Type of biopsy: punch   Informed consent: discussed and consent obtained   Timeout: patient name, date of birth, surgical site, and procedure verified   Procedure prep:  Patient was prepped and draped in usual sterile fashion (the patient was cleansed and prepped) Prep type:  Isopropyl alcohol Anesthesia: the lesion was anesthetized in a standard fashion   Anesthetic:  1% lidocaine w/ epinephrine 1-100,000 buffered w/ 8.4% NaHCO3 Punch size:  4 mm Suture size:  4-0 Suture type: nylon   Hemostasis achieved with: suture, pressure and aluminum chloride   Outcome: patient tolerated procedure well   Post-procedure details: sterile dressing applied and wound care instructions given   Dressing type: bandage, petrolatum and pressure  dressing    Specimen 1 - Surgical pathology Differential Diagnosis: R/O CCA vs LPP vs other   Check Margins: No  Elastosis of skin Head - Anterior (Face)  Will prescribe Skin Medicinals Anti-Aging Tretinoin 0.025%/Niacinamide/Vitamin C/Vitamin E/Turmeric/Resveratrol with Hyaluronic Acid. Apply pea sized amount nightly to the entire  face.  The patient was advised this is not covered by insurance since it is made by a compounding pharmacy. They will receive an email to check out and the medication will be mailed to their home.   Topical retinoid medications like tretinoin can cause dryness and irritation when first started. Only apply a pea-sized amount to the entire affected area. Avoid applying it around the eyes, edges of mouth and creases at the nose. If you experience irritation, use a good moisturizer first and/or apply the medicine less often. If you are doing well with the medicine, you can increase how often you use it until you are applying every night. Be careful with sun protection while using this medication as it can make you sensitive to the sun. This medicine should not be used by pregnant women.    Irritant contact dermatitis due to other agents vagina  Improved since last week already  No evidence of lichen sclerosus  Start Ketoconazole 2% cream apply to skin twice a day as needed Start Tacrolimus apply to skin twice a day as needed   Related Medications ketoconazole (NIZORAL) 2 % cream Apply 1 application topically 2 (two) times daily.  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 Condition is bothersome to patient. Not currently at goal.  Will prescribe Skin Medicinals Hydroquinone 12%/kojic acid/vitamin C cream pea sized amount twice daily to affected areas for up to 3 months. This cannot be used more than 3 months due to risk of exogenous ochronosis (permanent dark spots). The patient was advised this is not covered by insurance since it is made by a compounding pharmacy. They will receive an email to check out and the medication will be mailed to their home.    If any irritation, itch, redness - start tacrolimus ointment twice a day as needed If any irritation, hold hydroquinone until  inflammation calmed down using tacrolimus   Return in about 2 weeks (around 03/22/2021) for suture removal.  I, Marye Round, CMA, am acting as scribe for Forest Gleason, MD .   Documentation: I have reviewed the above documentation for accuracy and completeness, and I agree with the above.  Forest Gleason, MD

## 2021-03-08 NOTE — Patient Instructions (Addendum)

## 2021-03-09 ENCOUNTER — Other Ambulatory Visit: Payer: Self-pay | Admitting: Family Medicine

## 2021-03-09 ENCOUNTER — Other Ambulatory Visit: Payer: Self-pay

## 2021-03-12 ENCOUNTER — Other Ambulatory Visit: Payer: Self-pay | Admitting: Family Medicine

## 2021-03-14 ENCOUNTER — Telehealth (HOSPITAL_BASED_OUTPATIENT_CLINIC_OR_DEPARTMENT_OTHER): Payer: Self-pay

## 2021-03-14 MED ORDER — ATORVASTATIN CALCIUM 40 MG PO TABS: 40.0000 mg | ORAL_TABLET | Freq: Every day | ORAL | 3 refills | Status: DC

## 2021-03-14 NOTE — Telephone Encounter (Signed)
Received fax from pharmacy requesting refill.

## 2021-03-15 ENCOUNTER — Other Ambulatory Visit: Payer: Self-pay | Admitting: Dermatology

## 2021-03-15 ENCOUNTER — Ambulatory Visit (HOSPITAL_BASED_OUTPATIENT_CLINIC_OR_DEPARTMENT_OTHER): Payer: Medicaid Other | Admitting: Family Medicine

## 2021-03-15 MED ORDER — CLOBETASOL PROPIONATE 0.05 % EX SOLN: 1.0000 "application " | Freq: Two times a day (BID) | CUTANEOUS | 2 refills | Status: DC

## 2021-03-18 ENCOUNTER — Telehealth: Payer: Self-pay

## 2021-03-18 NOTE — Telephone Encounter (Signed)
I left a message on patient's voicemail to return my call. Dr. Laurence Ferrari will be out of the office next week and we need to get her moved to the nurse schedule for suture removal as well as have Dr. Laurence Ferrari review her treatment plan so we can review that with her next week.

## 2021-03-21 NOTE — Telephone Encounter (Signed)
Patient called and canceled appt for right now. 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   Advised patient I will call her back to reschedule once we have heard from Dr. Laurence Ferrari.

## 2021-03-22 ENCOUNTER — Ambulatory Visit: Payer: Medicaid Other | Admitting: Dermatology

## 2021-03-22 ENCOUNTER — Other Ambulatory Visit: Payer: Self-pay | Admitting: Nephrology

## 2021-03-22 ENCOUNTER — Telehealth: Payer: Self-pay

## 2021-03-22 DIAGNOSIS — N281 Cyst of kidney, acquired: Secondary | ICD-10-CM

## 2021-03-22 NOTE — Telephone Encounter (Signed)
Patient has been using the Clobetasol solution for a few days but hasn't yet noticed an improvement. We discussed ILK injections and oral treatment if not improved at follow up appointment. Patient asked me what the name of the oral treatment would be and I told her I wasn't a hundred percent sure of which medication Dr. Laurence Ferrari would recommended but Olumiant may be one of those. Then I read her pathology note and called the patient back and told her Olumiant wasn't one that Dr. Laurence Ferrari had mentioned, but oral Minoxidil, Doxycycline, and Hydroxychloroquine were potential treatment options. Appointment scheduled for follow up in 4 weeks.

## 2021-03-22 NOTE — Telephone Encounter (Signed)
Please confirm patient received topical steroid to be using for her hair loss. Recommend recheck in clinic in 3-4 weeks to see how she is doing with the topical medicine and consider injections to scalp. If not noticing improvement in hair loss, would also discuss possibly starting a pill to help with her hair if she is interested. Thank you!

## 2021-03-24 ENCOUNTER — Encounter (HOSPITAL_BASED_OUTPATIENT_CLINIC_OR_DEPARTMENT_OTHER): Payer: Self-pay | Admitting: Family Medicine

## 2021-03-29 ENCOUNTER — Telehealth: Payer: Self-pay

## 2021-03-29 NOTE — Telephone Encounter (Signed)
Patient called stating her hair loss is getting worse. She has called and asked for a prescription for oral minoxidil. Patient declines trying OTC topical solution at this time.

## 2021-03-30 ENCOUNTER — Encounter (HOSPITAL_BASED_OUTPATIENT_CLINIC_OR_DEPARTMENT_OTHER): Payer: Self-pay | Admitting: Family Medicine

## 2021-03-30 ENCOUNTER — Other Ambulatory Visit (HOSPITAL_BASED_OUTPATIENT_CLINIC_OR_DEPARTMENT_OTHER): Payer: Self-pay | Admitting: Family Medicine

## 2021-03-30 ENCOUNTER — Encounter (HOSPITAL_BASED_OUTPATIENT_CLINIC_OR_DEPARTMENT_OTHER): Payer: Self-pay

## 2021-03-31 ENCOUNTER — Ambulatory Visit: Payer: Medicaid Other | Admitting: Dermatology

## 2021-03-31 NOTE — Telephone Encounter (Signed)
Patient advised of information. She is coming in to see Dr. Laurence Ferrari at 4:30 today. Advised patient we will send in Minoxidil at appointment this afternoon. aw

## 2021-03-31 NOTE — Telephone Encounter (Signed)
As long as she has no history of heart disease (I do not see anything in her chart), we can start minoxidil 2.5 mg daily. The main side effects would be risk of some facial hair growth and also risk of swelling of the legs, so she should monitor for those. Also, I do not expect this to happen, but if she were to develop any shortness of breath or chest pain, she should stop the medicine immediately and seek medical care.   If she is able to come for a visit at 4:30 PM today, I could add her on to go ahead and do injections to her scalp to calm down inflammation, and that would likely work best for calming down her hair loss as quickly as possible. Thank you!

## 2021-04-02 ENCOUNTER — Other Ambulatory Visit (HOSPITAL_BASED_OUTPATIENT_CLINIC_OR_DEPARTMENT_OTHER): Payer: Self-pay | Admitting: Family Medicine

## 2021-04-05 ENCOUNTER — Ambulatory Visit (HOSPITAL_BASED_OUTPATIENT_CLINIC_OR_DEPARTMENT_OTHER): Payer: Medicaid Other | Admitting: Family Medicine

## 2021-04-05 ENCOUNTER — Ambulatory Visit: Payer: Medicaid Other | Admitting: Dermatology

## 2021-04-06 ENCOUNTER — Other Ambulatory Visit (HOSPITAL_BASED_OUTPATIENT_CLINIC_OR_DEPARTMENT_OTHER): Payer: Self-pay | Admitting: Family Medicine

## 2021-04-10 ENCOUNTER — Other Ambulatory Visit (HOSPITAL_BASED_OUTPATIENT_CLINIC_OR_DEPARTMENT_OTHER): Payer: Self-pay | Admitting: Family Medicine

## 2021-04-12 ENCOUNTER — Ambulatory Visit (HOSPITAL_BASED_OUTPATIENT_CLINIC_OR_DEPARTMENT_OTHER): Payer: Medicaid Other | Admitting: Family Medicine

## 2021-04-12 ENCOUNTER — Other Ambulatory Visit: Payer: Self-pay

## 2021-04-12 ENCOUNTER — Encounter (HOSPITAL_BASED_OUTPATIENT_CLINIC_OR_DEPARTMENT_OTHER): Payer: Self-pay | Admitting: Family Medicine

## 2021-04-12 VITALS — BP 160/92 | HR 78 | Ht 64.0 in | Wt 150.0 lb

## 2021-04-12 DIAGNOSIS — E1122 Type 2 diabetes mellitus with diabetic chronic kidney disease: Secondary | ICD-10-CM | POA: Diagnosis not present

## 2021-04-12 DIAGNOSIS — E039 Hypothyroidism, unspecified: Secondary | ICD-10-CM

## 2021-04-12 MED ORDER — CYCLOBENZAPRINE HCL 10 MG PO TABS: 10.0000 mg | ORAL_TABLET | Freq: Three times a day (TID) | ORAL | 0 refills | Status: DC | PRN

## 2021-04-12 MED ORDER — PROMETHAZINE HCL 25 MG PO TABS: 25.0000 mg | ORAL_TABLET | ORAL | 0 refills | Status: DC | PRN

## 2021-04-12 MED ORDER — IPRATROPIUM BROMIDE 0.06 % NA SOLN: 2.0000 | Freq: Three times a day (TID) | NASAL | 1 refills | Status: DC

## 2021-04-12 MED ORDER — METOPROLOL TARTRATE 25 MG PO TABS: 25.0000 mg | ORAL_TABLET | Freq: Two times a day (BID) | ORAL | 1 refills | Status: DC

## 2021-04-12 MED ORDER — SEMAGLUTIDE (1 MG/DOSE) 4 MG/3ML ~~LOC~~ SOPN: 1.0000 mg | PEN_INJECTOR | SUBCUTANEOUS | 2 refills | Status: DC

## 2021-04-12 NOTE — Progress Notes (Signed)
° ° °  Procedures performed today:    None.  Independent interpretation of notes and tests performed by another provider:   None.  Brief History, Exam, Impression, and Recommendations:    BP (!) 160/92    Pulse 78    Ht 5\' 4"  (1.626 m)    Wt 150 lb (68 kg)    LMP 06/18/2013    SpO2 98%    BMI 25.75 kg/m   Hypothyroidism Last TSH was at goal Continues with levothyroxine, takes as prescribed Will check TSH today to monitor therapy  Type 2 diabetes mellitus with diabetic chronic kidney disease ([place]) Continues with Lantus 20 units, semaglutide Reports being out of semaglutide at present - has been out for about 2 weeks Last A1c was about 4 to 5 months ago, was above goal at 8.1% We will check A1c today Patient previously referred to endocrinology, patient declined establishing with endocrinologist previously Pending results of current A1c, if remains above goal, will need to strongly encourage patient to establish with endocrinology  Spent 40 minutes on this patient encounter, including preparation, chart review, face-to-face counseling with patient and coordination of care, and documentation of encounter  Patient has upcoming imaging and procedure with nephrology for evaluation of kidney disease She will also be following up with orthopedic surgeon after having elbow surgery for ulnar neuropathy/cubital tunnel syndrome Plan for follow-up with me in about 6 weeks to follow-up on above as well as blood pressure   ___________________________________________ Hilliard Borges de [place], MD, ABFM, [facility] and [place]

## 2021-04-12 NOTE — Assessment & Plan Note (Signed)
Last TSH was at goal Continues with levothyroxine, takes as prescribed Will check TSH today to monitor therapy

## 2021-04-12 NOTE — Assessment & Plan Note (Addendum)
Continues with Lantus 20 units, semaglutide Reports being out of semaglutide at present - has been out for about 2 weeks Last A1c was about 4 to 5 months ago, was above goal at 8.1% We will check A1c today Patient previously referred to endocrinology, patient declined establishing with endocrinologist previously Pending results of current A1c, if remains above goal, will need to strongly encourage patient to establish with endocrinology

## 2021-04-12 NOTE — Patient Instructions (Signed)
°  Medication Instructions:  Your physician recommends that you continue on your current medications as directed. Please refer to the Current Medication list given to you today. --If you need a refill on any your medications before your next appointment, please call your pharmacy first. If no refills are authorized on file call the office.-- Lab Work: Your physician has recommended that you have lab work today: A1C and TSH If you have labs (blood work) drawn today and your tests are completely normal, you will receive your results via [place] a phone call from our staff.  Please ensure you check your voicemail in the event that you authorized detailed messages to be left on a delegated number. If you have any lab test that is abnormal or we need to change your treatment, we will call you to review the results.  Follow-Up: Your next appointment:   Your physician recommends that you schedule a follow-up appointment in: 73 WEEKS with Dr. de [place]  You will receive a text message or e-mail with a link to a survey about your care and experience with [place] today! We would greatly appreciate your feedback!   Thanks for letting us be apart of your health journey!!  Primary Care and Sports Medicine   Dr. Arlina Robes [place]   We encourage you to activate your patient portal called "MyChart".  Sign up information is provided on this After Visit Summary.  MyChart is used to connect with patients for Virtual Visits (Telemedicine).  Patients are able to view lab/test results, encounter notes, upcoming appointments, etc.  Non-urgent messages can be sent to your provider as well. To learn more about what you can do with MyChart, please visit --  [email or web address].

## 2021-04-13 ENCOUNTER — Telehealth (HOSPITAL_BASED_OUTPATIENT_CLINIC_OR_DEPARTMENT_OTHER): Payer: Self-pay

## 2021-04-13 LAB — HEMOGLOBIN A1C
Est. average glucose Bld gHb Est-mCnc: 171 mg/dL
Hgb A1c MFr Bld: 7.6 % — ABNORMAL HIGH (ref 4.8–5.6)

## 2021-04-13 LAB — TSH: TSH: 6.26 u[IU]/mL — ABNORMAL HIGH (ref 0.450–4.500)

## 2021-04-13 MED ORDER — LEVOTHYROXINE SODIUM 100 MCG PO TABS: 100.0000 ug | ORAL_TABLET | Freq: Every day | ORAL | 1 refills | Status: DC

## 2021-04-13 NOTE — Telephone Encounter (Signed)
-----   Message from Raymond J de [place], MD sent at 04/13/2021  8:19 AM EST ----- Hemoglobin A1c is still above goal of 7.0%, however has improved from most recent check.  TSH is above normal range, would slightly increase levothyroxine dose from 77mcg to 100 mcg daily and recheck TSH level in about 6 weeks.

## 2021-04-13 NOTE — Telephone Encounter (Signed)
Patient is aware and agreeable to lab results and recommendations Will send increased levothyroxine into pharmacy Patient verbalized understanding

## 2021-04-20 ENCOUNTER — Other Ambulatory Visit: Payer: Self-pay

## 2021-04-20 ENCOUNTER — Ambulatory Visit
Admission: RE | Admit: 2021-04-20 | Discharge: 2021-04-20 | Disposition: A | Discharge status: Home / Self Care | Payer: Medicaid Other | Source: Ambulatory Visit | Attending: Nephrology | Admitting: Nephrology

## 2021-04-20 DIAGNOSIS — N281 Cyst of kidney, acquired: Secondary | ICD-10-CM

## 2021-04-20 MED ORDER — GADOBENATE DIMEGLUMINE 529 MG/ML IV SOLN
13.0000 mL | Freq: Once | INTRAVENOUS | Status: AC | PRN
  Administered 2021-04-20: 13 mL via INTRAVENOUS

## 2021-04-21 ENCOUNTER — Ambulatory Visit: Payer: Medicaid Other | Admitting: Dermatology

## 2021-05-24 ENCOUNTER — Other Ambulatory Visit: Payer: Self-pay

## 2021-05-24 ENCOUNTER — Other Ambulatory Visit: Payer: Self-pay | Admitting: Dermatology

## 2021-05-24 ENCOUNTER — Ambulatory Visit (HOSPITAL_BASED_OUTPATIENT_CLINIC_OR_DEPARTMENT_OTHER): Payer: Medicaid Other | Admitting: Family Medicine

## 2021-05-24 ENCOUNTER — Encounter (HOSPITAL_BASED_OUTPATIENT_CLINIC_OR_DEPARTMENT_OTHER): Payer: Self-pay | Admitting: Family Medicine

## 2021-05-24 ENCOUNTER — Other Ambulatory Visit (HOSPITAL_BASED_OUTPATIENT_CLINIC_OR_DEPARTMENT_OTHER): Payer: Self-pay | Admitting: Family Medicine

## 2021-05-24 VITALS — BP 128/82 | HR 94 | Ht 64.0 in | Wt 150.0 lb

## 2021-05-24 DIAGNOSIS — J0110 Acute frontal sinusitis, unspecified: Secondary | ICD-10-CM | POA: Diagnosis not present

## 2021-05-24 DIAGNOSIS — E1122 Type 2 diabetes mellitus with diabetic chronic kidney disease: Secondary | ICD-10-CM | POA: Diagnosis not present

## 2021-05-24 DIAGNOSIS — E039 Hypothyroidism, unspecified: Secondary | ICD-10-CM | POA: Diagnosis not present

## 2021-05-24 DIAGNOSIS — R21 Rash and other nonspecific skin eruption: Secondary | ICD-10-CM

## 2021-05-24 NOTE — Progress Notes (Signed)
° ° °  Procedures performed today:    None.  Independent interpretation of notes and tests performed by another provider:   None.  Brief History, Exam, Impression, and Recommendations:    BP 128/82    Pulse 94    Ht 5\' 4"  (1.626 m)    Wt 150 lb (68 kg)    LMP 06/18/2013    SpO2 96%    BMI 25.75 kg/m   Sinus infection Over about the past 5 to 7 days, patient has had some sinus congestion, sore throat, bilateral ear pain.  Symptoms have been somewhat persistent during this time despite use of OTC measures.  She denies any recent fevers, chills, night sweats. On exam, patient in no acute distress.  She does have some tenderness to palpation over the frontal and maxillary sinuses.  Bilateral external auditory canals are normal in appearance with normal-appearing tympanic membranes, no significant fluid behind TMs. Given duration of symptoms, suspect viral etiology and thus recommend continuing with conservative measures at this time.  Did discuss that if symptoms continue over the coming days without improvement by upcoming weekend, she may contact the office to advise [place] of this and would consider sending in a course of antibiotics at that time as duration may indicate presence of bacterial infection.  Patient voiced understanding and agreement.  She indicates history of allergy to sulfa antibiotics, however no other allergies reported.  Type 2 diabetes mellitus with diabetic chronic kidney disease ([place]) Patient continues with Lantus 20 units daily and mealtime insulin, 15 units daily.  She has been referred to endocrinology previously she thinks that she will be seeing [facility] provider next month regarding this She also continues with semaglutide 1 mg weekly Most recent hemoglobin A1c was found to be 7.6% which is improved from prior labs, however remains above goal for patient Recommend continuing with current regimen and encouraged establishing with endocrinology Likely will check  hemoglobin A1c in about 6 weeks  Hypothyroidism Most recent TSH was slightly elevated and thus dose of levothyroxine was slightly increased Due for recheck of TSH today, will proceed with this   ___________________________________________ Kathryn Rutten de [place], MD, ABFM, CAQSM Primary Care and [place]

## 2021-05-24 NOTE — Patient Instructions (Signed)
°  Medication Instructions:  Your physician recommends that you continue on your current medications as directed. Please refer to the Current Medication list given to you today. --If you need a refill on any your medications before your next appointment, please call your pharmacy first. If no refills are authorized on file call the office.-- Lab Work: Your physician has recommended that you have lab work today: TSH  If you have labs (blood work) drawn today and your tests are completely normal, you will receive your results via [place] a phone call from our staff.  Please ensure you check your voicemail in the event that you authorized detailed messages to be left on a delegated number. If you have any lab test that is abnormal or we need to change your treatment, we will call you to review the results.   Follow-Up: Your next appointment:   Your physician recommends that you schedule a follow-up appointment in: 6-8 week follow upwith Dr. de [place]  You will receive a text message or e-mail with a link to a survey about your care and experience with [place] today! We would greatly appreciate your feedback!   Thanks for letting us be apart of your health journey!!  Primary Care and Sports Medicine   Dr. Arlina Robes [place]   We encourage you to activate your patient portal called "MyChart".  Sign up information is provided on this After Visit Summary.  MyChart is used to connect with patients for Virtual Visits (Telemedicine).  Patients are able to view lab/test results, encounter notes, upcoming appointments, etc.  Non-urgent messages can be sent to your provider as well. To learn more about what you can do with MyChart, please visit --  [email or web address].

## 2021-05-25 MED ORDER — MUPIROCIN 2 % EX OINT: TOPICAL_OINTMENT | Freq: Every day | CUTANEOUS | 1 refills | Status: DC

## 2021-05-26 LAB — TSH: TSH: 1.66 u[IU]/mL (ref 0.450–4.500)

## 2021-06-03 ENCOUNTER — Other Ambulatory Visit (HOSPITAL_BASED_OUTPATIENT_CLINIC_OR_DEPARTMENT_OTHER): Payer: Self-pay | Admitting: Family Medicine

## 2021-06-03 ENCOUNTER — Telehealth (HOSPITAL_BASED_OUTPATIENT_CLINIC_OR_DEPARTMENT_OTHER): Payer: Self-pay

## 2021-06-03 MED ORDER — AMOXICILLIN-POT CLAVULANATE 875-125 MG PO TABS: 1.0000 | ORAL_TABLET | Freq: Two times a day (BID) | ORAL | 0 refills | Status: DC

## 2021-06-03 NOTE — Assessment & Plan Note (Signed)
Patient continues with Lantus 20 units daily and mealtime insulin, 15 units daily.  She has been referred to endocrinology previously she thinks that she will be seeing [facility] provider next month regarding this ?She also continues with semaglutide 1 mg weekly ?Most recent hemoglobin A1c was found to be 7.6% which is improved from prior labs, however remains above goal for patient ?Recommend continuing with current regimen and encouraged establishing with endocrinology ?Likely will check hemoglobin A1c in about 6 weeks ?

## 2021-06-03 NOTE — Assessment & Plan Note (Signed)
Most recent TSH was slightly elevated and thus dose of levothyroxine was slightly increased ?Due for recheck of TSH today, will proceed with this ?

## 2021-06-03 NOTE — Telephone Encounter (Signed)
Pt called back this morning and is wanting a call back from providers CMA. ?Please advise. ?

## 2021-06-03 NOTE — Assessment & Plan Note (Signed)
Over about the past 5 to 7 days, patient has had some sinus congestion, sore throat, bilateral ear pain.  Symptoms have been somewhat persistent during this time despite use of OTC measures.  She denies any recent fevers, chills, night sweats. ?On exam, patient in no acute distress.  She does have some tenderness to palpation over the frontal and maxillary sinuses.  Bilateral external auditory canals are normal in appearance with normal-appearing tympanic membranes, no significant fluid behind TMs. ?Given duration of symptoms, suspect viral etiology and thus recommend continuing with conservative measures at this time.  Did discuss that if symptoms continue over the coming days without improvement by upcoming weekend, she may contact the office to advise [place] of this and would consider sending in a course of antibiotics at that time as duration may indicate presence of bacterial infection.  Patient voiced understanding and agreement.  She indicates history of allergy to sulfa antibiotics, however no other allergies reported. ?

## 2021-06-03 NOTE — Telephone Encounter (Signed)
Patient called on 03/02 and left a message on the nurse line requesting an antibiotic ?Patient states she discussed her sinus issues with Dr. de [place] at last office visit and at that time she didn't feel like she needed any medication yet ?She states now this is week two and her symptoms are not improving ?She is fatigues, having increased sinus pressure, and now experiencing ear pain ?Will route to Dr. de [place] for advisement ?

## 2021-06-08 ENCOUNTER — Other Ambulatory Visit: Payer: Self-pay | Admitting: Adult Health

## 2021-06-08 ENCOUNTER — Other Ambulatory Visit: Payer: Self-pay | Admitting: Family Medicine

## 2021-06-27 ENCOUNTER — Ambulatory Visit (INDEPENDENT_AMBULATORY_CARE_PROVIDER_SITE_OTHER): Payer: Medicaid Other | Admitting: Family Medicine

## 2021-06-27 ENCOUNTER — Other Ambulatory Visit: Payer: Self-pay

## 2021-06-27 DIAGNOSIS — G4700 Insomnia, unspecified: Secondary | ICD-10-CM

## 2021-06-27 DIAGNOSIS — F32A Depression, unspecified: Secondary | ICD-10-CM | POA: Diagnosis not present

## 2021-06-27 DIAGNOSIS — F419 Anxiety disorder, unspecified: Secondary | ICD-10-CM

## 2021-06-27 MED ORDER — ZOLPIDEM TARTRATE ER 12.5 MG PO TBCR: 12.5000 mg | EXTENDED_RELEASE_TABLET | Freq: Every day | ORAL | 0 refills | Status: DC

## 2021-06-27 NOTE — Progress Notes (Signed)
? ?  Virtual Visit via Telephone ?  ?I connected with  Kathryn Oconnell  on 06/27/21 by telephone/telehealth and verified that I am speaking with the correct person using two identifiers. ?  ?I discussed the limitations, risks, security and privacy concerns of performing an evaluation and management service by telephone, including the higher likelihood of inaccurate diagnosis and treatment, and the availability of in person appointments.  We also discussed the likely need of an additional face to face encounter for complete and high quality delivery of care.  I also discussed with the patient that there may be a patient responsible charge related to this service. The patient expressed understanding and wishes to proceed. ? ?Provider location is in medical facility. ?Patient location is at their home, different from provider location. ?People involved in care of the patient during this telehealth encounter were myself, my nurse/medical assistant, and my front office/scheduling team member. ? ?Review of Systems: No fevers, chills, night sweats, weight loss, chest pain, or shortness of breath.  ? ?Objective Findings:   ? ?General: Speaking full sentences, no audible heavy breathing.  Sounds alert and appropriately interactive.   ? ?Independent interpretation of tests performed by another provider:  ? ?None. ? ?Brief History, Exam, Impression, and Recommendations:   ? ?Anxiety and depression ?Unfortunately, patient has been discharged from her most recent psychiatry office due to too many missed appointments.  Due to this, she is requesting refill of medications which were being prescribed by her psychiatrist, at this time she is requesting refill of her Ambien.  She is also requesting referral to new psychiatrist.  In the past, there was difficulty with arranging for psychiatrist for patient to establish with due to insurance coverage for patient declining in network providers.  We will work to arrange for referral to new  psychiatrist for patient to establish with ?She had lorazepam refilled recently by her psychiatrist, he is requesting refill of Ambien.  Reviewed PDMP, no red flags at this time, refill of Ambien provided, sent to pharmacy as requested ? ?I discussed the above assessment and treatment plan with the patient. The patient was provided an opportunity to ask questions and all were answered. The patient agreed with the plan and demonstrated an understanding of the instructions. ?  ?The patient was advised to call back or seek an in-person evaluation if the symptoms worsen or if the condition fails to improve as anticipated. ?  ?I provided 10 minutes of face to face and non-face-to-face time during this encounter date, time was needed to gather information, review chart, records, communicate/coordinate with staff remotely, as well as complete documentation. ? ? ?___________________________________________ ?Garth Diffley de [place], MD, ABFM, CAQSM ?Primary Care and Sports Medicine ?[place] ?

## 2021-06-27 NOTE — Assessment & Plan Note (Addendum)
Unfortunately, patient has been discharged from her most recent psychiatry office due to too many missed appointments.  Due to this, she is requesting refill of medications which were being prescribed by her psychiatrist, at this time she is requesting refill of her Ambien.  She is also requesting referral to new psychiatrist.  In the past, there was difficulty with arranging for psychiatrist for patient to establish with due to insurance coverage for patient declining in network providers.  We will work to arrange for referral to new psychiatrist for patient to establish with ?She had lorazepam refilled recently by her psychiatrist, he is requesting refill of Ambien.  Reviewed PDMP, no red flags at this time, refill of Ambien provided, sent to pharmacy as requested ?

## 2021-06-30 ENCOUNTER — Other Ambulatory Visit (HOSPITAL_BASED_OUTPATIENT_CLINIC_OR_DEPARTMENT_OTHER): Payer: Self-pay | Admitting: Family Medicine

## 2021-07-01 ENCOUNTER — Other Ambulatory Visit (HOSPITAL_BASED_OUTPATIENT_CLINIC_OR_DEPARTMENT_OTHER): Payer: Self-pay | Admitting: Family Medicine

## 2021-07-01 MED ORDER — SERTRALINE HCL 200 MG PO CAPS: 1.0000 | ORAL_CAPSULE | Freq: Every morning | ORAL | 2 refills | Status: DC

## 2021-07-01 MED ORDER — VALACYCLOVIR HCL 1 G PO TABS: 1000.0000 mg | ORAL_TABLET | Freq: Every day | ORAL | 0 refills | Status: DC | PRN

## 2021-07-03 ENCOUNTER — Encounter (HOSPITAL_BASED_OUTPATIENT_CLINIC_OR_DEPARTMENT_OTHER): Payer: Self-pay | Admitting: Family Medicine

## 2021-07-06 ENCOUNTER — Other Ambulatory Visit (HOSPITAL_BASED_OUTPATIENT_CLINIC_OR_DEPARTMENT_OTHER): Payer: Self-pay | Admitting: Family Medicine

## 2021-07-07 ENCOUNTER — Other Ambulatory Visit (HOSPITAL_BASED_OUTPATIENT_CLINIC_OR_DEPARTMENT_OTHER): Payer: Self-pay

## 2021-07-07 ENCOUNTER — Other Ambulatory Visit (HOSPITAL_BASED_OUTPATIENT_CLINIC_OR_DEPARTMENT_OTHER): Payer: Self-pay | Admitting: Family Medicine

## 2021-07-07 DIAGNOSIS — E039 Hypothyroidism, unspecified: Secondary | ICD-10-CM

## 2021-07-07 MED ORDER — LEVOTHYROXINE SODIUM 100 MCG PO TABS: 100.0000 ug | ORAL_TABLET | Freq: Every day | ORAL | 1 refills | Status: DC

## 2021-07-07 MED ORDER — PROMETHAZINE HCL 25 MG PO TABS: 25.0000 mg | ORAL_TABLET | ORAL | 0 refills | Status: DC | PRN

## 2021-07-07 MED ORDER — LORAZEPAM 1 MG PO TABS: 1.0000 mg | ORAL_TABLET | Freq: Three times a day (TID) | ORAL | 0 refills | Status: DC | PRN

## 2021-07-11 ENCOUNTER — Encounter: Payer: Self-pay | Admitting: Adult Health

## 2021-07-11 ENCOUNTER — Ambulatory Visit: Payer: Medicaid Other | Admitting: Adult Health

## 2021-07-11 VITALS — BP 127/72 | HR 91 | Ht 63.5 in | Wt 147.0 lb

## 2021-07-11 DIAGNOSIS — N898 Other specified noninflammatory disorders of vagina: Secondary | ICD-10-CM | POA: Diagnosis not present

## 2021-07-11 DIAGNOSIS — B379 Candidiasis, unspecified: Secondary | ICD-10-CM | POA: Diagnosis not present

## 2021-07-11 LAB — POCT WET PREP (WET MOUNT)

## 2021-07-11 MED ORDER — FLUCONAZOLE 150 MG PO TABS: ORAL_TABLET | ORAL | 1 refills | Status: DC

## 2021-07-11 NOTE — Progress Notes (Signed)
?  Subjective:  ?  ? Patient ID: SHANIQUIA BRAFFORD, female   DOB: 1965-09-04, 56 y.o.   MRN: [id number] ? ?HPI ?Augustine is a 56 year old black female, widowed, PM in complaining of vaginal irritation. ? ?Lab Results  ?Component Value Date  ? DIAGPAP (A) 05/04/2020  ?  - Atypical squamous cells of undetermined significance (ASC-US)  ? HPV NOT DETECTED 09/28/2016  ? [place] Negative 05/04/2020  ? PCP is Dr Tennis Must [place] ? ?Review of Systems ?Has vaginal irritation ?No sex in 9 years but thinking about it, feels dry ?Reviewed past medical,surgical, social and family history. Reviewed medications and allergies.  ?   ?Objective:  ? Physical Exam ?BP 127/72 (BP Location: Right Arm, Patient Position: Sitting, Cuff Size: Normal)   Pulse 91   Ht 5' 3.5" (1.613 m)   Wt 147 lb (66.7 kg)   LMP 06/18/2013   BMI 25.63 kg/m?   ?Skin warm and dry.Pelvic: external genitalia is normal in appearance no lesions, vagina: white discharge without odor,urethra has no lesions or masses noted, cervix:smooth, uterus: normal size, shape and contour, non tender, no masses felt, adnexa: no masses or tenderness noted. Bladder is non tender and no masses felt. Wet prep: + yeast   ?  Examination chaperoned by Celene Squibb LPN ? [facility] - 07/11/21 1651   ? ?  ? Pregnancy Intention Screening  ? Does the patient want to become pregnant in the next year? N/A   ? Does the patient's partner want to become pregnant in the next year? N/A   ? Would the patient like to discuss contraceptive options today? N/A   ?  ? Contraception Wrap Up  ? Current Method --   PM  ? End Method --   PM  ? Contraception Counseling Provided No   ? ?  ?  ? ?  ?  ?Assessment:  ?   ?1. Vaginal irritation ?- POCT Wet Prep [facility]) ? ?2. Vaginal dryness ?If decides to have sex use astroglide and condoms  ? ?3. Yeast infection ?Will rx diflucan ?Meds ordered this encounter  ?Medications  ? fluconazole (DIFLUCAN) 150 MG tablet  ?  Sig: Take 1 now and 1 in 3 days  ?  Dispense:  2 tablet  ?   Refill:  1  ?  Order Specific Question:   Supervising Provider  ?  Answer:   Tania Ade H [[id number]]  ?  ?- POCT Wet Prep [facility])  ?   ?Plan:  ?   ?Follow up  ?   ?

## 2021-07-19 ENCOUNTER — Encounter (HOSPITAL_BASED_OUTPATIENT_CLINIC_OR_DEPARTMENT_OTHER): Payer: Self-pay | Admitting: Family Medicine

## 2021-07-19 ENCOUNTER — Ambulatory Visit (HOSPITAL_BASED_OUTPATIENT_CLINIC_OR_DEPARTMENT_OTHER): Payer: Medicaid Other | Admitting: Family Medicine

## 2021-07-19 VITALS — BP 94/57 | HR 81 | Temp 97.7°F | Ht 64.0 in | Wt 144.6 lb

## 2021-07-19 DIAGNOSIS — F419 Anxiety disorder, unspecified: Secondary | ICD-10-CM

## 2021-07-19 DIAGNOSIS — F5101 Primary insomnia: Secondary | ICD-10-CM

## 2021-07-19 DIAGNOSIS — E1122 Type 2 diabetes mellitus with diabetic chronic kidney disease: Secondary | ICD-10-CM | POA: Diagnosis not present

## 2021-07-19 DIAGNOSIS — F32A Depression, unspecified: Secondary | ICD-10-CM

## 2021-07-19 DIAGNOSIS — E039 Hypothyroidism, unspecified: Secondary | ICD-10-CM

## 2021-07-19 DIAGNOSIS — Z794 Long term (current) use of insulin: Secondary | ICD-10-CM

## 2021-07-19 MED ORDER — ZOLPIDEM TARTRATE ER 12.5 MG PO TBCR: 12.5000 mg | EXTENDED_RELEASE_TABLET | Freq: Every day | ORAL | 0 refills | Status: DC

## 2021-07-19 MED ORDER — INSULIN ASPART 100 UNIT/ML FLEXPEN: 5.0000 [IU] | PEN_INJECTOR | Freq: Three times a day (TID) | SUBCUTANEOUS | 6 refills | Status: DC

## 2021-07-19 MED ORDER — IPRATROPIUM BROMIDE 0.06 % NA SOLN: 2.0000 | Freq: Three times a day (TID) | NASAL | 1 refills | Status: DC

## 2021-07-19 MED ORDER — ATORVASTATIN CALCIUM 40 MG PO TABS: 40.0000 mg | ORAL_TABLET | Freq: Every day | ORAL | 3 refills | Status: DC

## 2021-07-19 MED ORDER — CYCLOBENZAPRINE HCL 10 MG PO TABS: 10.0000 mg | ORAL_TABLET | Freq: Three times a day (TID) | ORAL | 0 refills | Status: AC | PRN

## 2021-07-19 MED ORDER — LANTUS SOLOSTAR 100 UNIT/ML ~~LOC~~ SOPN: 15.0000 [IU] | PEN_INJECTOR | Freq: Every day | SUBCUTANEOUS | 11 refills | Status: DC

## 2021-07-19 MED ORDER — BD PEN NEEDLE MICRO U/F 32G X 6 MM MISC: 11 refills | Status: AC

## 2021-07-19 MED ORDER — VALACYCLOVIR HCL 1 G PO TABS: 1000.0000 mg | ORAL_TABLET | Freq: Every day | ORAL | 0 refills | Status: DC | PRN

## 2021-07-19 MED ORDER — METOPROLOL TARTRATE 25 MG PO TABS: 25.0000 mg | ORAL_TABLET | Freq: Two times a day (BID) | ORAL | 1 refills | Status: DC

## 2021-07-19 MED ORDER — LEVOTHYROXINE SODIUM 100 MCG PO TABS: 100.0000 ug | ORAL_TABLET | Freq: Every day | ORAL | 1 refills | Status: DC

## 2021-07-19 MED ORDER — SERTRALINE HCL 200 MG PO CAPS: 1.0000 | ORAL_CAPSULE | Freq: Every morning | ORAL | 2 refills | Status: DC

## 2021-07-19 MED ORDER — SEMAGLUTIDE (1 MG/DOSE) 4 MG/3ML ~~LOC~~ SOPN: 1.0000 mg | PEN_INJECTOR | SUBCUTANEOUS | 2 refills | Status: DC

## 2021-07-19 NOTE — Assessment & Plan Note (Signed)
Continues with levothyroxine 100 mcg daily, needing refill today, refill sent to pharmacy on file ?Last TSH was within normal limits, would plan to recheck TSH in 2 to 3 months to monitor status ?As above, was referred to endocrinologist, again encouraged to continue with establishing with specialist ?

## 2021-07-19 NOTE — Assessment & Plan Note (Signed)
Reports ongoing symptoms, reports that they are well controlled with use of Ambien, requesting refill of this today ?She was following with psychiatry, however was discharged from the practice due to missed appointments ?Patient has been referred to a provider, on chart review it appears that this is ready for scheduling, however patient has not been contacted by office.  We will look further into this and provide information for patient to be able to reach out and schedule establishing appointment ?

## 2021-07-19 NOTE — Progress Notes (Signed)
? ? ?  Procedures performed today:   ? ?None. ? ?Independent interpretation of notes and tests performed by another provider:  ? ?None. ? ?Brief History, Exam, Impression, and Recommendations:   ? ?BP (!) 94/57   Pulse 81   Temp 97.7 ?F (36.5 ?C)   Ht '5\' 4"'$  (1.626 m)   Wt 144 lb 9.6 oz (65.6 kg)   LMP 06/18/2013   SpO2 100%   BMI 24.82 kg/m?  ? ?Type 2 diabetes mellitus with diabetic chronic kidney disease ([place]) ?Patient continues with Lantus, mealtime insulin, semaglutide 1 mg weekly. ?Previously referred to endocrinologist, has not established yet -encourage patient to continue with establishing with specialist for further monitoring and treatment recommendations ?Plan for close follow-up in about 3 months, expect the patient will have established with endocrinology at that point ?If necessary, will check A1c in our office at next visit unless it has been checked with endocrinology ? ?Hypothyroidism ?Continues with levothyroxine 100 mcg daily, needing refill today, refill sent to pharmacy on file ?Last TSH was within normal limits, would plan to recheck TSH in 2 to 3 months to monitor status ?As above, was referred to endocrinologist, again encouraged to continue with establishing with specialist ? ?Insomnia ?Reports ongoing symptoms, reports that they are well controlled with use of Ambien, requesting refill of this today ?She was following with psychiatry, however was discharged from the practice due to missed appointments ?Patient has been referred to a provider, on chart review it appears that this is ready for scheduling, however patient has not been contacted by office.  We will look further into this and provide information for patient to be able to reach out and schedule establishing appointment ? ?Anxiety and depression ?As above, patient was discharged from most recent psychiatry office we have placed referral previously and it appears that appointment is ready to schedule, will provide patient  with information to schedule this establishing visit ?She is requesting refills of medications today, these medications have been refilled, and did review PDMP as well.  Lorazepam was refilled earlier this month, no refill provided at this time ? ?Plan for follow-up in about 3 months.  Plan to check A1c and TSH at that time ? ? ?___________________________________________ ?Cyrus Ramsburg de [place], MD, ABFM, CAQSM ?Primary Care and Sports Medicine ?[place] ?

## 2021-07-19 NOTE — Assessment & Plan Note (Signed)
Patient continues with Lantus, mealtime insulin, semaglutide 1 mg weekly. ?Previously referred to endocrinologist, has not established yet -encourage patient to continue with establishing with specialist for further monitoring and treatment recommendations ?Plan for close follow-up in about 3 months, expect the patient will have established with endocrinology at that point ?If necessary, will check A1c in our office at next visit unless it has been checked with endocrinology ?

## 2021-07-19 NOTE — Assessment & Plan Note (Signed)
As above, patient was discharged from most recent psychiatry office we have placed referral previously and it appears that appointment is ready to schedule, will provide patient with information to schedule this establishing visit ?She is requesting refills of medications today, these medications have been refilled, and did review PDMP as well.  Lorazepam was refilled earlier this month, no refill provided at this time ?

## 2021-08-05 ENCOUNTER — Other Ambulatory Visit (HOSPITAL_BASED_OUTPATIENT_CLINIC_OR_DEPARTMENT_OTHER): Payer: Self-pay | Admitting: Family Medicine

## 2021-08-08 ENCOUNTER — Other Ambulatory Visit (HOSPITAL_BASED_OUTPATIENT_CLINIC_OR_DEPARTMENT_OTHER): Payer: Self-pay | Admitting: Family Medicine

## 2021-08-08 ENCOUNTER — Telehealth (HOSPITAL_BASED_OUTPATIENT_CLINIC_OR_DEPARTMENT_OTHER): Payer: Self-pay | Admitting: Family Medicine

## 2021-08-08 MED ORDER — LORAZEPAM 1 MG PO TABS: 1.0000 mg | ORAL_TABLET | Freq: Three times a day (TID) | ORAL | 0 refills | Status: DC | PRN

## 2021-08-08 NOTE — Telephone Encounter (Signed)
Ok thanks I will notify pt of her refill.

## 2021-08-08 NOTE — Telephone Encounter (Signed)
Pt called wanting a refill on Lorazepam. Stated to pt about the note beside the medication in the chart about the last refill from you. I told her she would have to get it refilled from psychiatry. She stated it may be awhile before she get in because the referral sent out for her to the psychiatry doesn't cover her insurance. I notified her to call around to different locations to see if someone else will take her with her insurance. ? ?Thanks

## 2021-08-08 NOTE — Telephone Encounter (Signed)
Received a fax from after hours on 5/5 at 4:43 pm stating  ? ?"a script from provider at [facility] in [place] and they are telling pt that she has another provider and she needs someone to f/u on that. Pt is completely out of mediation  (Lorazepan). Pt was having anxiety" ? ?Please advise. ?

## 2021-08-10 ENCOUNTER — Other Ambulatory Visit (HOSPITAL_BASED_OUTPATIENT_CLINIC_OR_DEPARTMENT_OTHER): Payer: Self-pay

## 2021-08-10 ENCOUNTER — Telehealth (HOSPITAL_BASED_OUTPATIENT_CLINIC_OR_DEPARTMENT_OTHER): Payer: Self-pay

## 2021-08-10 MED ORDER — LORAZEPAM 1 MG PO TABS: 1.0000 mg | ORAL_TABLET | Freq: Three times a day (TID) | ORAL | 0 refills | Status: DC | PRN

## 2021-08-10 NOTE — Telephone Encounter (Signed)
Pt called and said her prescription had been sent to the wrong pharmacy for the Lorazepam. She would like to get [facility] to [facility] in [place], [place].  ? ?Thanks  ?

## 2021-08-18 ENCOUNTER — Other Ambulatory Visit (HOSPITAL_BASED_OUTPATIENT_CLINIC_OR_DEPARTMENT_OTHER): Payer: Self-pay | Admitting: Family Medicine

## 2021-08-22 ENCOUNTER — Encounter (HOSPITAL_BASED_OUTPATIENT_CLINIC_OR_DEPARTMENT_OTHER): Payer: Self-pay | Admitting: Family Medicine

## 2021-08-22 ENCOUNTER — Other Ambulatory Visit (HOSPITAL_BASED_OUTPATIENT_CLINIC_OR_DEPARTMENT_OTHER): Payer: Self-pay | Admitting: Family Medicine

## 2021-08-23 MED ORDER — PROMETHAZINE HCL 25 MG PO TABS: 25.0000 mg | ORAL_TABLET | ORAL | 0 refills | Status: DC | PRN

## 2021-08-23 MED ORDER — ZOLPIDEM TARTRATE ER 12.5 MG PO TBCR: 12.5000 mg | EXTENDED_RELEASE_TABLET | Freq: Every day | ORAL | 0 refills | Status: DC

## 2021-08-27 ENCOUNTER — Other Ambulatory Visit (HOSPITAL_BASED_OUTPATIENT_CLINIC_OR_DEPARTMENT_OTHER): Payer: Self-pay | Admitting: Family Medicine

## 2021-08-27 DIAGNOSIS — E039 Hypothyroidism, unspecified: Secondary | ICD-10-CM

## 2021-08-30 ENCOUNTER — Encounter: Payer: Self-pay | Admitting: Dermatology

## 2021-08-30 ENCOUNTER — Ambulatory Visit: Payer: Medicaid Other | Admitting: Dermatology

## 2021-08-30 DIAGNOSIS — L438 Other lichen planus: Secondary | ICD-10-CM | POA: Diagnosis not present

## 2021-08-30 DIAGNOSIS — L661 Lichen planopilaris: Secondary | ICD-10-CM

## 2021-08-30 DIAGNOSIS — L810 Postinflammatory hyperpigmentation: Secondary | ICD-10-CM

## 2021-08-30 DIAGNOSIS — L280 Lichen simplex chronicus: Secondary | ICD-10-CM

## 2021-08-30 MED ORDER — CLOBETASOL PROPIONATE 0.05 % EX OINT: TOPICAL_OINTMENT | CUTANEOUS | 2 refills | Status: DC

## 2021-08-30 MED ORDER — AZELAIC ACID 15 % EX GEL: CUTANEOUS | 2 refills | Status: DC

## 2021-08-30 MED ORDER — MINOXIDIL 2.5 MG PO TABS: 2.5000 mg | ORAL_TABLET | Freq: Every day | ORAL | 3 refills | Status: DC

## 2021-08-30 NOTE — Progress Notes (Signed)
Follow-Up Visit   Subjective  Kathryn Oconnell is a 56 y.o. female who presents for the following: Rash (Elbows. C/O dark thick skin. Patient states she cuts the thicker areas off), Alopecia (Bx proven LPP-scarring alopecia. States she never received Clobetasol solution. Was supposed to get Minoxidil 2.5 mg but wasn't sent to pharmacy. Recently cut hair as hair loss is worsening), and Skin Problem (Recheck lichen planus pigmentosus. Has been using Skin Medicinals Hydroquinone 12%/kojic acid/vitamin C cream. States it no longer appears to be improving).  Has lightened up from baseline.  Has been using HQ cream for more than 3 months.    The following portions of the chart were reviewed this encounter and updated as appropriate:      Review of Systems: No other skin or systemic complaints except as noted in HPI or Assessment and Plan.   Objective  Well appearing patient in no apparent distress; mood and affect are within normal limits.  A focused examination was performed including head, including the scalp, face, neck, nose, ears, eyelids, and lips and elbows. Relevant physical exam findings are noted in the Assessment and Plan.  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 Condition is symptomatic / bothersome to patient. Not to goal.   Avoid picking, peeling areas. Avoid pressure to areas. May use cushion when leaning up on it for any extended period  Start Clobetasol ointment twice daily to elbows. Recommend using in conjunction with AmLactin cream bid.  clobetasol ointment (TEMOVATE) 0.05 % - Right Elbow - Posterior Apply twice daily to affected areas on  elbows  Lichen planus pigmentosus face  Chronic and persistent condition with duration or expected duration over one year. Condition is symptomatic / bothersome to patient. Some improvement but not to goal.  Use Tacrolimus 0.1% ointment at bedtime. Start Azelaic acid gel at bedtime. Recommend daily broad spectrum sunscreen SPF 30+ to sun-exposed areas face qam  Stop Skin Medicinals Hydroquinone 12%/kojic acid/vitamin C cream for now for break, may restart in future  Azelaic Acid 15 % gel - face Apply to face at bedtime  Post-inflammatory hyperpigmentation B/L pretibia  Chronic and persistent condition with duration or expected duration over one year. Condition is symptomatic / bothersome to patient. Not to goal.   Use Tacrolimus 0.1% ointment at bedtime. Start Azelaic acid gel at bedtime. Recommend daily broad spectrum sunscreen SPF 30+ to sun-exposed areas legs, reapply every 2 hours as needed.   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 Condition is symptomatic / bothersome to patient. Not to goal.   Start Clobetasol ointment to affected areas on scalp at bedtime. Continue Tacrolimus 0.1% ointment qhs to temporal/frontal hairline Start Minoxidil 2.5 mg Po by mouth once daily.   BP 128/74  minoxidil (LONITEN) 2.5 MG tablet - Scalp Take 1 tablet (2.5 mg total) by mouth daily.   Return for Follow Up in 2-3 months with Dr. Laurence Ferrari.  I, Emelia Salisbury, CMA, am acting as scribe for Brendolyn Patty, MD.  Documentation: I have reviewed the above documentation for accuracy and completeness, and  I agree with the above.  Brendolyn Patty MD

## 2021-08-30 NOTE — Patient Instructions (Addendum)
Elbows Start Clobetasol ointment twice daily.  Recommend starting moisturizer with exfoliant (Urea, Salicylic acid, or Lactic acid) one to two times daily to help smooth rough and bumpy skin.  OTC options include Cetaphil Rough and Bumpy lotion (Urea), Eucerin Roughness Relief lotion or spot treatment cream (Urea), CeraVe SA lotion/cream for Rough and Bumpy skin (Sal Acid), Gold Bond Rough and Bumpy cream (Sal Acid), and AmLactin 12% lotion/cream (Lactic Acid).  If applying in morning, also apply sunscreen to sun-exposed areas, since these exfoliating moisturizers can increase sensitivity to sun.  Face/Legs Use Tacrolimus at bedtime. Start Azelaic acid at bedtime.  Stop Skin Medicinals Hydroquinone 12%/kojic acid/vitamin C cream for now. Can resume in 2 months.   Scalp:  Start Clobetasol ointment to affected areas on scalp at bedtime. Start Minoxidil 2.5 mg by mouth once daily If causing dizziness decrease to 1/2 tablet daily.  Make sure hyperthyroidism is controlled.   Topical steroids (such as triamcinolone, fluocinolone, fluocinonide, mometasone, clobetasol, halobetasol, betamethasone, hydrocortisone) can cause thinning and lightening of the skin if they are used for too long in the same area. Your physician has selected the right strength medicine for your problem and area affected on the body. Please use your medication only as directed by your physician to prevent side effects.    If You Need Anything After Your Visit  If you have any questions or concerns for your doctor, please call our main line at [phone] and press option 4 to reach your doctor's medical assistant. If no one answers, please leave a voicemail as directed and we will return your call as soon as possible. Messages left after 4 pm will be answered the following business day.   You may also send [place] a message via [place]. We typically respond to MyChart messages within 1-2 business days.  For prescription refills,  please ask your pharmacy to contact our office. Our fax number is [phone].  If you have an urgent issue when the clinic is closed that cannot wait until the next business day, you can page your doctor at the number below.    Please note that while we do our best to be available for urgent issues outside of office hours, we are not available 24/7.   If you have an urgent issue and are unable to reach [place], you may choose to seek medical care at your doctor's office, retail clinic, urgent care center, or emergency room.  If you have a medical emergency, please immediately call 911 or go to the emergency department.  Pager Numbers  - Dr. Nehemiah Massed: [phone]  - Dr. Laurence Ferrari: [phone]  - Dr. Nicole Kindred: [phone]  In the event of inclement weather, please call our main line at [phone] for an update on the status of any delays or closures.  Dermatology Medication Tips: Please keep the boxes that topical medications come in in order to help keep track of the instructions about where and how to use these. Pharmacies typically print the medication instructions only on the boxes and not directly on the medication tubes.   If your medication is too expensive, please contact our office at [phone] option 4 or send [place] a message through [place].   We are unable to tell what your co-pay for medications will be in advance as this is different depending on your insurance coverage. However, we may be able to find a substitute medication at lower cost or fill out paperwork to get insurance to cover a needed medication.   If a  prior authorization is required to get your medication covered by your insurance company, please allow [place] 1-2 business days to complete this process.  Drug prices often vary depending on where the prescription is filled and some pharmacies may offer cheaper prices.  The website www.goodrx.com contains coupons for medications through different pharmacies. The prices  here do not account for what the cost may be with help from insurance (it may be cheaper with your insurance), but the website can give you the price if you did not use any insurance.  - You can print the associated coupon and take it with your prescription to the pharmacy.  - You may also stop by our office during regular business hours and pick up a GoodRx coupon card.  - If you need your prescription sent electronically to a different pharmacy, notify our office through [facility] or by phone at [phone] option 4.     Si Usted Necesita Algo Despus de Su Visita  Tambin puede enviarnos un mensaje a travs de Pharmacist, community. Por lo general respondemos a los mensajes de MyChart en el transcurso de 1 a 2 das hbiles.  Para renovar recetas, por favor pida a su farmacia que se ponga en contacto con nuestra oficina. Harland Dingwall de fax es [place] [phone].  Si tiene un asunto urgente cuando la clnica est cerrada y que no puede esperar hasta el siguiente da hbil, puede llamar/localizar a su doctor(a) al nmero que aparece a continuacin.   Por favor, tenga en cuenta que aunque hacemos todo lo posible para estar disponibles para asuntos urgentes fuera del horario de [place], no estamos disponibles las 24 horas del da, los 7 das de la [place].   Si tiene un problema urgente y no puede comunicarse con nosotros, puede optar por buscar atencin mdica  en el consultorio de su doctor(a), en una clnica privada, en un centro de atencin urgente o en una sala de emergencias.  Si tiene Engineering geologist, por favor llame inmediatamente al 911 o vaya a la sala de emergencias.  Nmeros de bper  - Dr. Nehemiah Massed: [phone]  - Dra. Moye: [phone]  - Dra. Nicole Kindred: [phone]  En caso de inclemencias del [place], por favor llame a Johnsie Kindred principal al [phone] para una actualizacin sobre el [place] de cualquier retraso o cierre.  Consejos para la medicacin en  dermatologa: Por favor, guarde las cajas en las que vienen los medicamentos de uso tpico para ayudarle a seguir las instrucciones sobre dnde y cmo usarlos. Las farmacias generalmente imprimen las instrucciones del medicamento slo en las cajas y no directamente en los tubos del [place].   Si su medicamento es muy caro, por favor, pngase en contacto con Zigmund Daniel llamando al [phone] y presione la opcin 4 o envenos un mensaje a travs de Pharmacist, community.   No podemos decirle cul ser su copago por los medicamentos por adelantado ya que esto es diferente dependiendo de la cobertura de su seguro. Sin embargo, es posible que podamos encontrar un medicamento sustituto a Electrical engineer un formulario para que el seguro cubra el medicamento que se considera necesario.   Si se requiere una autorizacin previa para que su compaa de seguros [place] su medicamento, por favor permtanos de 1 a 2 das hbiles para completar este proceso.  Los precios de los medicamentos varan con frecuencia dependiendo del Environmental consultant de dnde se surte la receta y alguna farmacias pueden ofrecer precios ms baratos.  El sitio web www.goodrx.com tiene cupones  para medicamentos de diferentes farmacias. Los precios aqu no tienen en cuenta lo que podra costar con la ayuda del seguro (puede ser ms barato con su seguro), pero el sitio web puede darle el precio si no utiliz ningn seguro.  - Puede imprimir el cupn correspondiente y llevarlo con su receta a la farmacia.  - Tambin puede pasar por nuestra oficina durante el horario de atencin regular y recoger una tarjeta de cupones de GoodRx.  - Si necesita que su receta se enve electrnicamente a una farmacia diferente, informe a nuestra oficina a travs de MyChart de [place] o por telfono llamando al [phone] y presione la opcin 4.  

## 2021-09-01 ENCOUNTER — Telehealth: Payer: Self-pay

## 2021-09-01 NOTE — Telephone Encounter (Signed)
Azelaic Acid not covered by Medicaid until patient T/Fs Metronidazole.

## 2021-09-01 NOTE — Telephone Encounter (Signed)
Tried calling patient but unable to leave VM.   Sent MyChart msg as well.

## 2021-09-03 ENCOUNTER — Other Ambulatory Visit (HOSPITAL_BASED_OUTPATIENT_CLINIC_OR_DEPARTMENT_OTHER): Payer: Self-pay | Admitting: Family Medicine

## 2021-09-05 ENCOUNTER — Other Ambulatory Visit (HOSPITAL_BASED_OUTPATIENT_CLINIC_OR_DEPARTMENT_OTHER): Payer: Self-pay | Admitting: Family Medicine

## 2021-09-05 ENCOUNTER — Other Ambulatory Visit: Payer: Self-pay

## 2021-09-05 DIAGNOSIS — L438 Other lichen planus: Secondary | ICD-10-CM

## 2021-09-05 MED ORDER — AZELAIC ACID 15 % EX GEL: CUTANEOUS | 2 refills | Status: DC

## 2021-09-05 NOTE — Progress Notes (Signed)
Change of pharmacies due to coverage. aw

## 2021-09-07 ENCOUNTER — Telehealth (HOSPITAL_BASED_OUTPATIENT_CLINIC_OR_DEPARTMENT_OTHER): Payer: Self-pay | Admitting: Family Medicine

## 2021-09-07 MED ORDER — LORAZEPAM 1 MG PO TABS: 1.0000 mg | ORAL_TABLET | Freq: Three times a day (TID) | ORAL | 0 refills | Status: DC | PRN

## 2021-09-07 NOTE — Telephone Encounter (Signed)
Called and lvm for referral department at office and sending fax referral again as Urgent.

## 2021-09-07 NOTE — Telephone Encounter (Signed)
Received voicemail from [employer]. Voicemail stated that they had called and tried to schedule with the pt and they could not reach her, so they left a voicemail instead. Also said that if we had any questions about this referral, they can be reached at [phone]

## 2021-09-08 ENCOUNTER — Other Ambulatory Visit (HOSPITAL_BASED_OUTPATIENT_CLINIC_OR_DEPARTMENT_OTHER): Payer: Self-pay | Admitting: Family Medicine

## 2021-09-08 ENCOUNTER — Other Ambulatory Visit: Payer: Self-pay | Admitting: Dermatology

## 2021-09-08 DIAGNOSIS — L661 Lichen planopilaris: Secondary | ICD-10-CM

## 2021-09-13 ENCOUNTER — Other Ambulatory Visit (HOSPITAL_BASED_OUTPATIENT_CLINIC_OR_DEPARTMENT_OTHER): Payer: Self-pay | Admitting: Family Medicine

## 2021-09-18 ENCOUNTER — Other Ambulatory Visit (HOSPITAL_BASED_OUTPATIENT_CLINIC_OR_DEPARTMENT_OTHER): Payer: Self-pay | Admitting: Family Medicine

## 2021-09-18 ENCOUNTER — Encounter (HOSPITAL_BASED_OUTPATIENT_CLINIC_OR_DEPARTMENT_OTHER): Payer: Self-pay | Admitting: Family Medicine

## 2021-09-19 ENCOUNTER — Other Ambulatory Visit (HOSPITAL_BASED_OUTPATIENT_CLINIC_OR_DEPARTMENT_OTHER): Payer: Self-pay

## 2021-09-19 ENCOUNTER — Other Ambulatory Visit (HOSPITAL_BASED_OUTPATIENT_CLINIC_OR_DEPARTMENT_OTHER): Payer: Self-pay | Admitting: Family Medicine

## 2021-09-19 DIAGNOSIS — E039 Hypothyroidism, unspecified: Secondary | ICD-10-CM

## 2021-09-19 MED ORDER — LEVOTHYROXINE SODIUM 100 MCG PO TABS: 100.0000 ug | ORAL_TABLET | Freq: Every day | ORAL | 1 refills | Status: DC

## 2021-09-19 MED ORDER — METOPROLOL TARTRATE 25 MG PO TABS: 25.0000 mg | ORAL_TABLET | Freq: Two times a day (BID) | ORAL | 0 refills | Status: DC

## 2021-09-19 NOTE — Telephone Encounter (Signed)
Sent referral to [facility].

## 2021-10-06 ENCOUNTER — Telehealth (HOSPITAL_BASED_OUTPATIENT_CLINIC_OR_DEPARTMENT_OTHER): Payer: Self-pay

## 2021-10-11 ENCOUNTER — Other Ambulatory Visit (HOSPITAL_BASED_OUTPATIENT_CLINIC_OR_DEPARTMENT_OTHER): Payer: Self-pay | Admitting: Family Medicine

## 2021-10-11 MED ORDER — LORAZEPAM 1 MG PO TABS: 1.0000 mg | ORAL_TABLET | Freq: Three times a day (TID) | ORAL | 0 refills | Status: DC | PRN

## 2021-10-11 NOTE — Telephone Encounter (Signed)
Referral put in and send off to [facility] also faxed on 7/11 @ 1:00 and received confirmation

## 2021-10-12 ENCOUNTER — Other Ambulatory Visit (HOSPITAL_BASED_OUTPATIENT_CLINIC_OR_DEPARTMENT_OTHER): Payer: Self-pay | Admitting: Family Medicine

## 2021-10-18 ENCOUNTER — Ambulatory Visit (HOSPITAL_BASED_OUTPATIENT_CLINIC_OR_DEPARTMENT_OTHER): Payer: Medicaid Other | Admitting: Family Medicine

## 2021-10-18 ENCOUNTER — Ambulatory Visit: Payer: Medicaid Other | Admitting: Podiatry

## 2021-10-18 ENCOUNTER — Encounter (HOSPITAL_BASED_OUTPATIENT_CLINIC_OR_DEPARTMENT_OTHER): Payer: Self-pay | Admitting: Family Medicine

## 2021-10-24 ENCOUNTER — Ambulatory Visit (HOSPITAL_BASED_OUTPATIENT_CLINIC_OR_DEPARTMENT_OTHER): Payer: Medicaid Other | Admitting: Family Medicine

## 2021-10-26 ENCOUNTER — Telehealth (HOSPITAL_BASED_OUTPATIENT_CLINIC_OR_DEPARTMENT_OTHER): Payer: Self-pay | Admitting: Family Medicine

## 2021-10-26 NOTE — Telephone Encounter (Signed)
Pt called earlier this morning on 7/26 wanting an appt for med refill. Provider stated she would need to come into the office to be seen can not be seen virtually. Called and lvm for pt to sch appt. Pt is already dismissed from practice on 7/17 and we can see pt for acute visits only until 11/17/21.

## 2021-10-28 ENCOUNTER — Encounter (HOSPITAL_BASED_OUTPATIENT_CLINIC_OR_DEPARTMENT_OTHER): Payer: Self-pay | Admitting: Family Medicine

## 2021-10-28 ENCOUNTER — Other Ambulatory Visit: Payer: Self-pay | Admitting: Adult Health

## 2021-10-29 ENCOUNTER — Other Ambulatory Visit (HOSPITAL_BASED_OUTPATIENT_CLINIC_OR_DEPARTMENT_OTHER): Payer: Self-pay | Admitting: Family Medicine

## 2021-10-31 ENCOUNTER — Other Ambulatory Visit: Payer: Self-pay | Admitting: Internal Medicine

## 2021-10-31 ENCOUNTER — Encounter (HOSPITAL_BASED_OUTPATIENT_CLINIC_OR_DEPARTMENT_OTHER): Payer: Self-pay | Admitting: Family Medicine

## 2021-10-31 ENCOUNTER — Other Ambulatory Visit (HOSPITAL_COMMUNITY): Payer: Self-pay | Admitting: Internal Medicine

## 2021-10-31 DIAGNOSIS — N281 Cyst of kidney, acquired: Secondary | ICD-10-CM

## 2021-11-03 ENCOUNTER — Encounter (HOSPITAL_BASED_OUTPATIENT_CLINIC_OR_DEPARTMENT_OTHER): Payer: Self-pay | Admitting: Family Medicine

## 2021-11-03 ENCOUNTER — Ambulatory Visit (HOSPITAL_BASED_OUTPATIENT_CLINIC_OR_DEPARTMENT_OTHER): Payer: Medicaid Other | Admitting: Family Medicine

## 2021-11-03 VITALS — BP 128/91 | HR 77 | Ht 64.0 in | Wt 146.4 lb

## 2021-11-03 DIAGNOSIS — F32A Depression, unspecified: Secondary | ICD-10-CM

## 2021-11-03 DIAGNOSIS — Z794 Long term (current) use of insulin: Secondary | ICD-10-CM

## 2021-11-03 DIAGNOSIS — E1122 Type 2 diabetes mellitus with diabetic chronic kidney disease: Secondary | ICD-10-CM | POA: Diagnosis not present

## 2021-11-03 DIAGNOSIS — F419 Anxiety disorder, unspecified: Secondary | ICD-10-CM | POA: Diagnosis not present

## 2021-11-03 DIAGNOSIS — F5101 Primary insomnia: Secondary | ICD-10-CM | POA: Diagnosis not present

## 2021-11-03 DIAGNOSIS — E039 Hypothyroidism, unspecified: Secondary | ICD-10-CM | POA: Diagnosis not present

## 2021-11-03 MED ORDER — BREXPIPRAZOLE 1 MG PO TABS
1.0000 mg | ORAL_TABLET | Freq: Every day | ORAL | 1 refills | Status: DC
Start: 2021-11-03 — End: 2022-02-22

## 2021-11-03 MED ORDER — VALACYCLOVIR HCL 1 G PO TABS: 1000.0000 mg | ORAL_TABLET | Freq: Every day | ORAL | 0 refills | Status: DC | PRN

## 2021-11-03 MED ORDER — LINZESS 145 MCG PO CAPS: 145.0000 ug | ORAL_CAPSULE | Freq: Every day | ORAL | 0 refills | Status: DC

## 2021-11-03 MED ORDER — DEXCOM G6 TRANSMITTER MISC: 1.0000 | 11 refills | Status: AC

## 2021-11-03 MED ORDER — ATORVASTATIN CALCIUM 40 MG PO TABS: 40.0000 mg | ORAL_TABLET | Freq: Every day | ORAL | 0 refills | Status: DC

## 2021-11-03 MED ORDER — LANTUS SOLOSTAR 100 UNIT/ML ~~LOC~~ SOPN: 15.0000 [IU] | PEN_INJECTOR | Freq: Every day | SUBCUTANEOUS | 1 refills | Status: AC

## 2021-11-03 MED ORDER — SERTRALINE HCL 200 MG PO CAPS: 1.0000 | ORAL_CAPSULE | Freq: Every morning | ORAL | 1 refills | Status: DC

## 2021-11-03 MED ORDER — INSULIN ASPART 100 UNIT/ML FLEXPEN: 5.0000 [IU] | PEN_INJECTOR | Freq: Three times a day (TID) | SUBCUTANEOUS | 1 refills | Status: DC

## 2021-11-03 MED ORDER — ZOLPIDEM TARTRATE ER 12.5 MG PO TBCR: 12.5000 mg | EXTENDED_RELEASE_TABLET | Freq: Every day | ORAL | 0 refills | Status: DC

## 2021-11-03 MED ORDER — METOPROLOL TARTRATE 25 MG PO TABS: 25.0000 mg | ORAL_TABLET | Freq: Two times a day (BID) | ORAL | 0 refills | Status: AC

## 2021-11-03 MED ORDER — LEVOTHYROXINE SODIUM 100 MCG PO TABS: 100.0000 ug | ORAL_TABLET | Freq: Every day | ORAL | 0 refills | Status: DC

## 2021-11-03 MED ORDER — LORAZEPAM 1 MG PO TABS: 1.0000 mg | ORAL_TABLET | Freq: Three times a day (TID) | ORAL | 0 refills | Status: DC | PRN

## 2021-11-03 MED ORDER — DEXCOM G6 SENSOR MISC: 1.0000 | 11 refills | Status: AC

## 2021-11-03 NOTE — Progress Notes (Signed)
    Procedures performed today:    None.  Independent interpretation of notes and tests performed by another provider:   None.  Brief History, Exam, Impression, and Recommendations:    BP (!) 128/91   Pulse 77   Ht '5\' 4"'$  (1.626 m)   Wt 146 lb 6.4 oz (66.4 kg)   LMP 06/18/2013   SpO2 100%   BMI 25.13 kg/m   Patient being seen today for follow-up/acute visit as she has been discharged from practice due to accumulation of no-shows.  She is requesting refills on various medications including atorvastatin, brexpiprazole, NovoLog, Lantus, levothyroxine, linaclotide, lorazepam, metoprolol, sertraline, valacyclovir, zolpidem.  She has been following with psychiatry in the past, however was discharged from most recent psychiatry office due to missed appointments and no-shows.  Hypothyroidism Most recent TSH level was normal.  Provided refill of levothyroxine today.  Anxiety and depression Unfortunately, patient was discharged from her most recent psychiatry office.  She is requesting refills today on medications, some which are controlled substances.  PDMP was reviewed, no red flags.  Refills provided for patient.  Recommend that she find a provider locally to establish with for continued management.  Type 2 diabetes mellitus with diabetic chronic kidney disease ([place]) She continues with use of NovoLog and Lantus.  Requesting refill today, refill provided for patient.  Recommend continuing with close monitoring of blood sugars utilizing CGM device.  Insomnia As with anxiety and depression and controlled substances, PDMP was reviewed without any red legs at this time.  Provided refill of zolpidem in office today.  Patient is aware of need to establish with new provider locally for primary care needs as well as looking to establish with new psychiatry office.  Return if symptoms worsen or fail to improve.  Patient is aware of need to establish with new primary care office  locally.   ___________________________________________ Kemiya Batdorf de [place], MD, ABFM, CAQSM Primary Care and [place]

## 2021-11-03 NOTE — Patient Instructions (Signed)
  Medication Instructions:  Your physician recommends that you continue on your current medications as directed. Please refer to the Current Medication list given to you today. --If you need a refill on any your medications before your next appointment, please call your pharmacy first. If no refills are authorized on file call the office.-- Lab Work: Your physician has recommended that you have lab work today: No If you have labs (blood work) drawn today and your tests are completely normal, you will receive your results via MyChart message OR a phone call from our staff.  Please ensure you check your voicemail in the event that you authorized detailed messages to be left on a delegated number. If you have any lab test that is abnormal or we need to change your treatment, we will call you to review the results.  Referrals/Procedures/Imaging: Yes  Follow-Up: Your next appointment:   Your physician recommends that you schedule a follow-up appointment as needed with Dr. de [place].  You will receive a text message or e-mail with a link to a survey about your care and experience with us today! We would greatly appreciate your feedback!   Thanks for letting us be apart of your health journey!!  Primary Care and Sports Medicine   Dr. Raymond de Cuba   We encourage you to activate your patient portal called "MyChart".  Sign up information is provided on this After Visit Summary.  MyChart is used to connect with patients for Virtual Visits (Telemedicine).  Patients are able to view lab/test results, encounter notes, upcoming appointments, etc.  Non-urgent messages can be sent to your provider as well. To learn more about what you can do with MyChart, please visit --  [email or web address].    

## 2021-11-09 NOTE — Assessment & Plan Note (Signed)
Unfortunately, patient was discharged from her most recent psychiatry office.  She is requesting refills today on medications, some which are controlled substances.  PDMP was reviewed, no red flags.  Refills provided for patient.  Recommend that she find a provider locally to establish with for continued management.

## 2021-11-09 NOTE — Assessment & Plan Note (Signed)
She continues with use of NovoLog and Lantus.  Requesting refill today, refill provided for patient.  Recommend continuing with close monitoring of blood sugars utilizing CGM device.

## 2021-11-09 NOTE — Assessment & Plan Note (Signed)
As with anxiety and depression and controlled substances, PDMP was reviewed without any red legs at this time.  Provided refill of zolpidem in office today.  Patient is aware of need to establish with new provider locally for primary care needs as well as looking to establish with new psychiatry office.

## 2021-11-09 NOTE — Assessment & Plan Note (Signed)
Most recent TSH level was normal.  Provided refill of levothyroxine today.

## 2021-11-17 ENCOUNTER — Ambulatory Visit: Payer: Medicaid Other | Admitting: Dermatology

## 2021-11-22 ENCOUNTER — Telehealth (HOSPITAL_BASED_OUTPATIENT_CLINIC_OR_DEPARTMENT_OTHER): Payer: Self-pay

## 2021-11-22 MED ORDER — LORAZEPAM 1 MG PO TABS: 1.0000 mg | ORAL_TABLET | Freq: Three times a day (TID) | ORAL | 0 refills | Status: DC | PRN

## 2021-11-22 NOTE — Addendum Note (Signed)
Addended by: DE [place], Casyn Becvar J on: 11/22/2021 04:40 PM   Modules accepted: Orders

## 2021-11-23 ENCOUNTER — Ambulatory Visit (HOSPITAL_COMMUNITY): Payer: Medicaid Other

## 2021-11-24 ENCOUNTER — Other Ambulatory Visit (HOSPITAL_BASED_OUTPATIENT_CLINIC_OR_DEPARTMENT_OTHER): Payer: Self-pay | Admitting: Family Medicine

## 2021-11-25 ENCOUNTER — Encounter (HOSPITAL_COMMUNITY): Payer: Self-pay

## 2021-11-25 ENCOUNTER — Ambulatory Visit (HOSPITAL_COMMUNITY): Admission: RE | Admit: 2021-11-25 | Payer: Medicaid Other | Source: Ambulatory Visit

## 2021-11-26 ENCOUNTER — Other Ambulatory Visit (HOSPITAL_BASED_OUTPATIENT_CLINIC_OR_DEPARTMENT_OTHER): Payer: Self-pay | Admitting: Family Medicine

## 2021-12-19 NOTE — Telephone Encounter (Signed)
Error

## 2021-12-29 ENCOUNTER — Other Ambulatory Visit (HOSPITAL_BASED_OUTPATIENT_CLINIC_OR_DEPARTMENT_OTHER): Payer: Self-pay | Admitting: Family Medicine

## 2022-01-04 ENCOUNTER — Ambulatory Visit (HOSPITAL_COMMUNITY): Admission: RE | Admit: 2022-01-04 | Payer: Medicaid Other | Source: Ambulatory Visit

## 2022-01-06 ENCOUNTER — Other Ambulatory Visit (HOSPITAL_BASED_OUTPATIENT_CLINIC_OR_DEPARTMENT_OTHER): Payer: Self-pay | Admitting: Family Medicine

## 2022-01-11 ENCOUNTER — Ambulatory Visit (HOSPITAL_COMMUNITY)
Admission: RE | Admit: 2022-01-11 | Discharge: 2022-01-11 | Disposition: A | Discharge status: Home / Self Care | Payer: Medicaid Other | Source: Ambulatory Visit | Attending: Internal Medicine | Admitting: Internal Medicine

## 2022-01-11 ENCOUNTER — Ambulatory Visit: Payer: Medicaid Other | Admitting: Dermatology

## 2022-01-11 DIAGNOSIS — N281 Cyst of kidney, acquired: Secondary | ICD-10-CM | POA: Diagnosis present

## 2022-01-23 ENCOUNTER — Other Ambulatory Visit: Payer: Self-pay | Admitting: Dermatology

## 2022-01-23 ENCOUNTER — Other Ambulatory Visit: Payer: Self-pay | Admitting: Adult Health

## 2022-01-23 DIAGNOSIS — L280 Lichen simplex chronicus: Secondary | ICD-10-CM

## 2022-01-24 ENCOUNTER — Encounter: Payer: Self-pay | Admitting: Family Medicine

## 2022-01-24 ENCOUNTER — Ambulatory Visit: Payer: Medicaid Other | Admitting: Family Medicine

## 2022-01-24 VITALS — BP 122/70 | HR 97 | Ht 64.0 in | Wt 140.0 lb

## 2022-01-24 DIAGNOSIS — Z23 Encounter for immunization: Secondary | ICD-10-CM | POA: Diagnosis not present

## 2022-01-24 DIAGNOSIS — E782 Mixed hyperlipidemia: Secondary | ICD-10-CM

## 2022-01-24 DIAGNOSIS — I10 Essential (primary) hypertension: Secondary | ICD-10-CM

## 2022-01-24 DIAGNOSIS — F32A Depression, unspecified: Secondary | ICD-10-CM

## 2022-01-24 DIAGNOSIS — Z1159 Encounter for screening for other viral diseases: Secondary | ICD-10-CM

## 2022-01-24 DIAGNOSIS — Z794 Long term (current) use of insulin: Secondary | ICD-10-CM

## 2022-01-24 DIAGNOSIS — F17210 Nicotine dependence, cigarettes, uncomplicated: Secondary | ICD-10-CM | POA: Diagnosis not present

## 2022-01-24 DIAGNOSIS — E039 Hypothyroidism, unspecified: Secondary | ICD-10-CM | POA: Diagnosis not present

## 2022-01-24 DIAGNOSIS — F419 Anxiety disorder, unspecified: Secondary | ICD-10-CM

## 2022-01-24 DIAGNOSIS — E1122 Type 2 diabetes mellitus with diabetic chronic kidney disease: Secondary | ICD-10-CM

## 2022-01-24 DIAGNOSIS — R112 Nausea with vomiting, unspecified: Secondary | ICD-10-CM | POA: Insufficient documentation

## 2022-01-24 DIAGNOSIS — R5383 Other fatigue: Secondary | ICD-10-CM

## 2022-01-24 DIAGNOSIS — E559 Vitamin D deficiency, unspecified: Secondary | ICD-10-CM

## 2022-01-24 DIAGNOSIS — Z720 Tobacco use: Secondary | ICD-10-CM | POA: Diagnosis not present

## 2022-01-24 DIAGNOSIS — G8929 Other chronic pain: Secondary | ICD-10-CM

## 2022-01-24 DIAGNOSIS — M549 Dorsalgia, unspecified: Secondary | ICD-10-CM

## 2022-01-24 DIAGNOSIS — G609 Hereditary and idiopathic neuropathy, unspecified: Secondary | ICD-10-CM

## 2022-01-24 MED ORDER — PROMETHAZINE HCL 25 MG PO TABS: 25.0000 mg | ORAL_TABLET | ORAL | 0 refills | Status: DC | PRN

## 2022-01-24 NOTE — Assessment & Plan Note (Signed)
Patient educated on CDC recommendation for the vaccine. Verbal consent was obtained from the patient, vaccine administered by nurse, no sign of adverse reactions noted at this time. Patient education on arm soreness and use of tylenol or ibuprofen for this patient  was discussed. Patient educated on the signs and symptoms of adverse effect and advise to contact the office if they occur.  

## 2022-01-24 NOTE — Progress Notes (Addendum)
New Patient Office Visit  Subjective:  Patient ID: Kathryn Oconnell, female    DOB: [birth year]  Age: 56 y.o. MRN: [id number]  CC:  Chief Complaint  Patient presents with   Effort pt, establish care, states she sees pain management, recently pain medication was cut back, reports extreme fatigue, c/o feet hurting wants to go ahead and have foot exam today, will request records from [facility] for [facility], and eye exam records from [facility] in [place].     HPI Kathryn Oconnell is a 56 y.o. female with past medical history of anxiety and depression, IBS with constipation, hypothyroidism, hyperlipidemia, type 2 diabetes, and hypertension presents for establishing care.  Anxiety and depression: She was following up with [facility] but reported being dismissed from practice due to multiple missed appointments.  She reports increased fatigue and anhedonia. she takes brexpiprazole 1 mg daily for depression.  She also takes sertraline 200 mg capsule every morning for depression and lorazepam 1 mg 3 times daily for anxiety .she denies suicidal or homicidal ideation.  IBS with constipation: She takes linzess 145 mcg capsule daily  Hypothyroidism: She takes Synthroid 100 mcg daily before breakfast.  She denies muscle aches and chest pain.  Type 2 diabetes: She is on Ozempic 1 mg weekly subcu injection, Jardiance 10 mg daily, Lantus 15 units daily and NovoLog sliding scale.  She follows up with [facility] endocrinology for her insulin management.  She denies polyuria, polyphagia, and polydipsia  She reports not feeling well since pain management decreased her narcotics to oxy10 mg 4 times daily due to her kidney function she reports that she used to take oxycodone 15 mg 3 times daily and xtampza 9 mg twice daily.  She reports following up with pain management on 03/02/2022.  She takes Lyrica 150 mg capsule daily as needed, for nerve pain with minimal relief of her symptoms. she denies fever,  body aches, nasal congestion, sinus pain and pressure, abdominal pain, and sore throat.  She complains of increased nausea and vomiting and requests a refill of Phenergan 25 mg as needed prescription.  Lichen Planus: she reports taking temovate 0.05% ointment  twice daily to affected areas on elbows.  Hypertension: She takes metoprolol 25 mg twice daily.  She denies headaches, dizziness, and blurred vision.  Hyperlipidemia: She takes atorvastatin 40 mg daily and denies muscle aches.    Past Medical History:  Diagnosis Date   Anxiety    ASCUS of cervix with negative high risk HPV 05/14/2020   05/2020 ASCUS negative HPV, repeat pap in 3 years with HPV, 5 year CIN3+ risk is 0.27% per ASCCP guidelines    Chronic kidney disease    COPD (chronic obstructive pulmonary disease) ([place])    Depression    Diabetes mellitus without complication ([place])    Eczema    Graves disease    HSV-2 infection    Hyperlipidemia    Hypertension    Hypothyroid    Lung nodule    Menopausal vaginal dryness 02/24/2014   Migraines    Neuropathy    Tobacco use    Trouble in sleeping 06/25/2015    Past Surgical History:  Procedure Laterality Date   CESAREAN SECTION     x 2   FOOT SURGERY     5th digit left foot / Great toe, 2ND TOE   THYROID SURGERY      Family History  Problem Relation Age of Onset   Stroke Mother  x 3   Cancer Mother        breast    Thyroid disease Mother    Hyperlipidemia Mother    Cancer Father        stomach cancer    Hypertension Father    Heart attack Father 44   Gout Brother    Migraines Son    Heart attack Son    Hypertension Paternal Uncle    Heart disease Maternal Grandmother    Heart attack Maternal Grandmother    Heart disease Maternal Grandfather    Heart attack Maternal Grandfather    Cancer Paternal Grandmother    Diabetes Paternal Grandfather    Asthma Daughter     Social History   Socioeconomic History   Marital status: Widowed    Spouse name:  Not on file   Number of children: Not on file   Years of education: Not on file   Highest education level: Not on file  Occupational History   Not on file  Tobacco Use   Smoking status: Every Day    Packs/day: 0.50    Years: 35.00    Total pack years: 17.50    Types: Cigarettes   Smokeless tobacco: Never   Tobacco comments:    smokes 10 cig daily  Vaping Use   Vaping Use: Former  Substance and Sexual Activity   Alcohol use: No   Drug use: No   Sexual activity: Not Currently    Birth control/protection: Post-menopausal  Other Topics Concern   Not on file  Social History Narrative   Not on file   Social Determinants of Health   Financial Resource Strain: Medium Risk (05/04/2020)   Overall Financial Resource Strain (CARDIA)    Difficulty of Paying Living Expenses: Somewhat hard  Food Insecurity: No Food Insecurity (05/04/2020)   Hunger Vital Sign    Worried About Running Out of Food in the Last Year: Never true    Ran Out of Food in the Last Year: Never true  Transportation Needs: No Transportation Needs (05/04/2020)   PRAPARE - Hydrologist (Medical): No    Lack of Transportation (Non-Medical): No  Physical Activity: Insufficiently Active (05/04/2020)   Exercise Vital Sign    Days of Exercise per Week: 1 day    Minutes of Exercise per Session: 60 min  Stress: Stress Concern Present (05/04/2020)   [place]    Feeling of Stress : Very much  Social Connections: Moderately Isolated (05/04/2020)   Social Connection and Isolation Panel [NHANES]    Frequency of Communication with Friends and Family: Three times a week    Frequency of Social Gatherings with Friends and Family: Once a week    Attends Religious Services: Never    Marine scientist or Organizations: No    Attends Archivist Meetings: Never    Marital Status: Married  Human resources officer Violence: Not At Risk  (05/04/2020)   Humiliation, Afraid, Rape, and Kick questionnaire    Fear of Current or Ex-Partner: No    Emotionally Abused: No    Physically Abused: No    Sexually Abused: No    ROS Review of Systems  Constitutional:  Positive for fatigue. Negative for fever.  HENT:  Negative for rhinorrhea, sinus pressure and sinus pain.   Eyes:  Negative for redness and visual disturbance.  Respiratory:  Negative for chest tightness and shortness of breath.   Cardiovascular:  Negative  for chest pain and palpitations.  Gastrointestinal:  Negative for nausea and vomiting.  Endocrine: Negative for polydipsia, polyphagia and polyuria.  Genitourinary:  Negative for pelvic pain and urgency.  Musculoskeletal:  Positive for back pain. Negative for gait problem and neck pain.  Skin:  Negative for rash and wound.  Neurological:  Negative for dizziness and light-headedness.  Psychiatric/Behavioral:  Negative for self-injury and suicidal ideas.     Objective:   Today's Vitals: BP 122/70   Pulse 97   Ht 5' 4" (1.626 m)   Wt 140 lb 0.6 oz (63.5 kg)   LMP 06/18/2013   SpO2 97%   BMI 24.04 kg/m   Physical Exam HENT:     Head: Normocephalic.     Right Ear: External ear normal.     Left Ear: External ear normal.     Nose: No congestion.     Mouth/Throat:     Mouth: Mucous membranes are moist.  Eyes:     Extraocular Movements: Extraocular movements intact.     Pupils: Pupils are equal, round, and reactive to light.  Cardiovascular:     Rate and Rhythm: Normal rate and regular rhythm.     Pulses: Normal pulses.  Pulmonary:     Effort: Pulmonary effort is normal.     Breath sounds: Normal breath sounds.  Abdominal:     Palpations: Abdomen is soft.     Tenderness: There is no right CVA tenderness or left CVA tenderness.  Musculoskeletal:     Cervical back: No rigidity.     Right lower leg: No edema.     Left lower leg: No edema.  Skin:    Findings: No lesion or rash.  Neurological:     Mental  Status: She is alert and oriented to person, place, and time.  Psychiatric:     Comments: Normal affect     Assessment & Plan:   Problem List Items Addressed This Visit       Cardiovascular and Mediastinum   Essential hypertension, benign - Primary    Controlled Encouraged to continue taking metoprolol 25 mg twice daily No changes to treatment regimen today BP Readings from Last 3 Encounters:  01/24/22 122/70  11/03/21 (!) 128/91  07/19/21 (!) 94/57        Relevant Orders   CBC with Differential/Platelet   CMP14+EGFR   Microalbumin / creatinine urine ratio     Digestive   Nausea and vomiting    Nausea and vomiting are multifactorial from side effects of the medications that she is currently on Refill Phenergan 25 mg tablet       Relevant Medications   promethazine (PHENERGAN) 25 MG tablet     Endocrine   Hypothyroidism    She takes Synthroid 100 mcg daily before breakfast We will check thyroid levels today No changes to treatment regimen      Relevant Orders   TSH + free T4   Type 2 diabetes mellitus with diabetic chronic kidney disease ([place])    She is on Ozempic 1 mg weekly subcu injection, Jardiance 10 mg daily, Lantus 15 units daily and NovoLog sliding scale She follows up with [facility] endocrinology for her insulin management Will check her hemoglobin A1c today Lab Results  Component Value Date   HGBA1C 7.6 (H) 04/12/2021       Relevant Medications   JARDIANCE 10 MG TABS tablet   Other Relevant Orders   HM Diabetes Foot Exam (Completed)   Hemoglobin A1C  Nervous and Auditory   Peripheral neuropathy    She takes Lyrica 150 mg capsule daily as needed for nerve pain She reports minimal relief of her symptoms with the current medication She reports following up with pain management         Other   Tobacco use    Smokes about 10 cigarettes/day  Asked about quitting: confirms that she currently smokes cigarettes Advise to quit smoking:  Educated about QUITTING to reduce the risk of cancer, cardio and cerebrovascular disease. Assess willingness: Unwilling to quit at this time, but is working on cutting back. Assist with counseling and pharmacotherapy: Counseled for 5 minutes and literature provided. Arrange for follow up: follow up in 3 months and continue to offer help.      Chronic back pain    She follows up with pain management for her back pain She reports not feeling well since her narcotics were decreased to Oxy 10 mg 4 times daily only due to her kidney function She has an upcoming appointment with pain management on 03/02/2022 She also uses her lidocaine patch for her back pain  Encouraged to continue taking Oxy 10 mg 4 times daily as needed for pain and to follow up with pain management on 03/02/2022       Mixed hyperlipidemia    She takes atorvastatin 40 mg daily and denies muscle aches We will check  lipid panel today Encouraged to continue treatment regimen      Relevant Orders   Lipid Profile   Anxiety and depression    She was following up with [facility] but reported being dismissed from practice due to multiple missed appointments She reports increased fatigue and anhedonia She takes brexpiprazole 1 mg daily for depression She also takes sertraline 200 mg capsule every morning for depression and lorazepam 1 mg 3 times daily for anxiety She denies suicidal or homicidal ideation Referral placed to psychiatry      Relevant Orders   Ambulatory referral to Psychiatry   Fatigue    Likely due to her depression We will check B12 and folate levels today We will check CBC and CMP Encouraged to increase fluid intake and physical activities We will also check thyroid levels today       Relevant Orders   B12 and Folate Panel   Need for immunization against influenza    Patient educated on CDC recommendation for the vaccine. Verbal consent was obtained from the patient, vaccine administered by  nurse, no sign of adverse reactions noted at this time. Patient education on arm soreness and use of tylenol or ibuprofen for this patient  was discussed. Patient educated on the signs and symptoms of adverse effect and advise to contact the office if they occur.      Other Visit Diagnoses     Flu vaccine need       Relevant Orders   Flu Vaccine QUAD 6+ mos PF IM (Fluarix Quad PF) (Completed)   Immunization due       Relevant Orders   Varicella-zoster vaccine IM (Completed)   Vitamin D deficiency       Relevant Orders   Vitamin D (25 hydroxy)   Need for hepatitis C screening test       Relevant Orders   Hepatitis C Antibody       Outpatient Encounter Medications as of 01/24/2022  Medication Sig   aspirin 81 MG tablet Take 81 mg by mouth daily.   atorvastatin (LIPITOR) 40 MG tablet  Take 1 tablet (40 mg total) by mouth daily.   Azelaic Acid 15 % gel Apply to face at bedtime   brexpiprazole (REXULTI) 1 MG TABS tablet Take 1 tablet (1 mg total) by mouth daily.   clobetasol (TEMOVATE) 0.05 % external solution Apply 1 application topically 2 (two) times daily. To affected areas at the scalp. Avoid applying to face, groin, and axilla.   clobetasol ointment (TEMOVATE) 0.05 % Apply twice daily to affected areas on elbows   Continuous Blood Gluc Sensor (DEXCOM G6 SENSOR) MISC 1 Device by Does not apply route as directed.   Continuous Blood Gluc Transmit (DEXCOM G6 TRANSMITTER) MISC 1 Device by Does not apply route as directed.   cyclobenzaprine (FLEXERIL) 10 MG tablet Take 1 tablet (10 mg total) by mouth 3 (three) times daily as needed.   fluconazole (DIFLUCAN) 150 MG tablet Take 1 now and 1 in 3 days   glucose blood test strip 1 each by Other route 4 (four) times daily. Use as instructed to check blood sugar four times daily.   insulin aspart (NOVOLOG) 100 UNIT/ML FlexPen Inject 5-11 Units into the skin 3 (three) times daily before meals. Use up to 50U QD SQ per insulin pump   insulin  glargine (LANTUS SOLOSTAR) 100 UNIT/ML Solostar Pen Inject 15 Units into the skin daily.   Insulin Pen Needle (BD PEN NEEDLE MICRO U/F) 32G X 6 MM MISC USE AS DIRECTED   ipratropium (ATROVENT) 0.06 % nasal spray PLACE 2 SPRAYS INTO THE NOSE 3 (THREE) TIMES DAILY.   JARDIANCE 10 MG TABS tablet Take 10 mg by mouth daily.   levothyroxine (SYNTHROID) 100 MCG tablet Take 1 tablet (100 mcg total) by mouth daily before breakfast.   lidocaine (LIDODERM) 5 % SMARTSIG:1-3 Patch(s) Topical   LINZESS 145 MCG CAPS capsule Take 1 capsule (145 mcg total) by mouth daily.   LORazepam (ATIVAN) 1 MG tablet Take 1 tablet (1 mg total) by mouth 3 (three) times daily as needed. for anxiety   LYRICA 150 MG capsule Take 100 mg by mouth daily as needed.   medroxyPROGESTERone (PROVERA) 2.5 MG tablet TAKE 1 TABLET BY MOUTH ONCE DAILY. APPOINTMENT REQUIRED FOR FUTURE REFILLS   metoprolol tartrate (LOPRESSOR) 25 MG tablet Take 1 tablet (25 mg total) by mouth 2 (two) times daily.   minoxidil (LONITEN) 2.5 MG tablet TAKE 1 TABLET BY MOUTH EVERY DAY   mupirocin ointment (BACTROBAN) 2 % Apply topically daily.   Oxycodone HCl 10 MG TABS Take 10 mg by mouth 4 (four) times daily as needed.   Semaglutide, 1 MG/DOSE, 4 MG/3ML SOPN Inject 1 mg into the skin once a week.   Sertraline HCl 200 MG CAPS Take 1 capsule by mouth every morning.   valACYclovir (VALTREX) 1000 MG tablet Take 1 tablet (1,000 mg total) by mouth daily as needed.   zolpidem (AMBIEN CR) 12.5 MG CR tablet Take 1 tablet (12.5 mg total) by mouth at bedtime.   promethazine (PHENERGAN) 25 MG tablet Take 1 tablet (25 mg total) by mouth as needed for nausea or vomiting.   [DISCONTINUED] promethazine (PHENERGAN) 25 MG tablet TAKE 1 TABLET (25 MG TOTAL) BY MOUTH AS NEEDED FOR NAUSEA OR VOMITING. (Patient not taking: Reported on 01/24/2022)   [DISCONTINUED] tacrolimus (PROTOPIC) 0.1 % ointment Apply to affected skin twice a day   [DISCONTINUED] XTAMPZA ER 9 MG C12A Take 1  capsule by mouth 2 (two) times daily.   No facility-administered encounter medications on file as of 01/24/2022.  Follow-up: Return in about 3 months (around 04/26/2022).   Alvira Monday, FNP

## 2022-01-24 NOTE — Assessment & Plan Note (Signed)
She is on Ozempic 1 mg weekly subcu injection, Jardiance 10 mg daily, Lantus 15 units daily and NovoLog sliding scale She follows up with [facility] endocrinology for her insulin management Will check her hemoglobin A1c today Lab Results  Component Value Date   HGBA1C 7.6 (H) 04/12/2021

## 2022-01-24 NOTE — Assessment & Plan Note (Addendum)
She takes Lyrica 150 mg capsule daily as needed for nerve pain She reports minimal relief of her symptoms with the current medication She reports following up with pain management

## 2022-01-24 NOTE — Patient Instructions (Signed)
I appreciate the opportunity to provide care to you today!    Follow up:  3 months  Labs: please stop by the lab today to get your blood drawn (CBC, CMP, TSH, Lipid profile, HgA1c, Vit D)  Screening: Hep C, vit b12 and folate  Please pick up your refills at the pharmacy   Please continue to a heart-healthy diet and increase your physical activities. Try to exercise for 59mns at least three times a week.      It was a pleasure to see you and I look forward to continuing to work together on your health and well-being. Please do not hesitate to call the office if you need care or have questions about your care.   Have a wonderful day and week. With Gratitude, GAlvira MondayMSN, FNP-BC

## 2022-01-24 NOTE — Assessment & Plan Note (Signed)
Smokes about 10 cigarettes/day  Asked about quitting: confirms that she currently smokes cigarettes Advise to quit smoking: Educated about QUITTING to reduce the risk of cancer, cardio and cerebrovascular disease. Assess willingness: Unwilling to quit at this time, but is working on cutting back. Assist with counseling and pharmacotherapy: Counseled for 5 minutes and literature provided. Arrange for follow up: follow up in 3 months and continue to offer help.

## 2022-01-24 NOTE — Assessment & Plan Note (Signed)
Likely due to her depression We will check B12 and folate levels today We will check CBC and CMP Encouraged to increase fluid intake and physical activities We will also check thyroid levels today

## 2022-01-24 NOTE — Assessment & Plan Note (Addendum)
She follows up with pain management for her back pain She reports not feeling well since her narcotics were decreased to Oxy 10 mg 4 times daily only due to her kidney function She has an upcoming appointment with pain management on 03/02/2022 She also uses her lidocaine patch for her back pain  Encouraged to continue taking Oxy 10 mg 4 times daily as needed for pain and to follow up with pain management on 03/02/2022

## 2022-01-24 NOTE — Assessment & Plan Note (Signed)
Controlled Encouraged to continue taking metoprolol 25 mg twice daily No changes to treatment regimen today BP Readings from Last 3 Encounters:  01/24/22 122/70  11/03/21 (!) 128/91  07/19/21 (!) 94/57

## 2022-01-24 NOTE — Assessment & Plan Note (Signed)
She takes atorvastatin 40 mg daily and denies muscle aches We will check  lipid panel today Encouraged to continue treatment regimen

## 2022-01-24 NOTE — Assessment & Plan Note (Signed)
Nausea and vomiting are multifactorial from side effects of the medications that she is currently on Refill Phenergan 25 mg tablet

## 2022-01-24 NOTE — Assessment & Plan Note (Signed)
She was following up with [facility] but reported being dismissed from practice due to multiple missed appointments She reports increased fatigue and anhedonia She takes brexpiprazole 1 mg daily for depression She also takes sertraline 200 mg capsule every morning for depression and lorazepam 1 mg 3 times daily for anxiety She denies suicidal or homicidal ideation Referral placed to psychiatry

## 2022-01-24 NOTE — Assessment & Plan Note (Signed)
She takes Synthroid 100 mcg daily before breakfast We will check thyroid levels today No changes to treatment regimen

## 2022-01-25 LAB — B12 AND FOLATE PANEL
Folate: 17.7 ng/mL (ref >3.0)
Vitamin B-12: 268 pg/mL (ref 232–1245)

## 2022-01-26 LAB — CBC WITH DIFFERENTIAL/PLATELET
Basophils Absolute: 0.1 10*3/uL (ref 0.0–0.2)
Basos: 1 %
EOS (ABSOLUTE): 0.1 10*3/uL (ref 0.0–0.4)
Eos: 1 %
Hematocrit: 48.2 % — ABNORMAL HIGH (ref 34.0–46.6)
Hemoglobin: 16.8 g/dL — ABNORMAL HIGH (ref 11.1–15.9)
Immature Grans (Abs): 0 10*3/uL (ref 0.0–0.1)
Immature Granulocytes: 0 %
Lymphocytes Absolute: 2.3 10*3/uL (ref 0.7–3.1)
Lymphs: 24 %
MCH: 36 pg — ABNORMAL HIGH (ref 26.6–33.0)
MCHC: 34.9 g/dL (ref 31.5–35.7)
MCV: 103 fL — ABNORMAL HIGH (ref 79–97)
Monocytes Absolute: 0.5 10*3/uL (ref 0.1–0.9)
Monocytes: 5 %
Neutrophils Absolute: 6.7 10*3/uL (ref 1.4–7.0)
Neutrophils: 69 %
Platelets: 240 10*3/uL (ref 150–450)
RBC: 4.67 x10E6/uL (ref 3.77–5.28)
RDW: 15.4 % (ref 11.7–15.4)
WBC: 9.7 10*3/uL (ref 3.4–10.8)

## 2022-01-26 LAB — VITAMIN D 25 HYDROXY (VIT D DEFICIENCY, FRACTURES): Vit D, 25-Hydroxy: 9.4 ng/mL — ABNORMAL LOW (ref 30.0–100.0)

## 2022-01-26 LAB — CMP14+EGFR
ALT: 16 IU/L (ref 0–32)
AST: 28 IU/L (ref 0–40)
Albumin/Globulin Ratio: 1.6 (ref 1.2–2.2)
Albumin: 4.9 g/dL (ref 3.8–4.9)
Alkaline Phosphatase: 101 IU/L (ref 44–121)
BUN/Creatinine Ratio: 18 (ref 9–23)
BUN: 29 mg/dL — ABNORMAL HIGH (ref 6–24)
Bilirubin Total: 0.4 mg/dL (ref 0.0–1.2)
CO2: 17 mmol/L — ABNORMAL LOW (ref 20–29)
Calcium: 10.2 mg/dL (ref 8.7–10.2)
Chloride: 101 mmol/L (ref 96–106)
Creatinine, Ser: 1.64 mg/dL — ABNORMAL HIGH (ref 0.57–1.00)
Globulin, Total: 3.1 g/dL (ref 1.5–4.5)
Glucose: 215 mg/dL — ABNORMAL HIGH (ref 70–99)
Potassium: 5 mmol/L (ref 3.5–5.2)
Sodium: 140 mmol/L (ref 134–144)
Total Protein: 8 g/dL (ref 6.0–8.5)
eGFR: 37 mL/min/{1.73_m2} — ABNORMAL LOW (ref >59)

## 2022-01-26 LAB — LIPID PANEL
Chol/HDL Ratio: 3.7 ratio (ref 0.0–4.4)
Cholesterol, Total: 259 mg/dL — ABNORMAL HIGH (ref 100–199)
HDL: 70 mg/dL (ref >39)
LDL Chol Calc (NIH): 165 mg/dL — ABNORMAL HIGH (ref 0–99)
Triglycerides: 134 mg/dL (ref 0–149)
VLDL Cholesterol Cal: 24 mg/dL (ref 5–40)

## 2022-01-26 LAB — HEMOGLOBIN A1C
Est. average glucose Bld gHb Est-mCnc: 200 mg/dL
Hgb A1c MFr Bld: 8.6 % — ABNORMAL HIGH (ref 4.8–5.6)

## 2022-01-26 LAB — TSH+FREE T4
Free T4: 1.03 ng/dL (ref 0.82–1.77)
TSH: 3.24 u[IU]/mL (ref 0.450–4.500)

## 2022-01-27 ENCOUNTER — Other Ambulatory Visit: Payer: Self-pay | Admitting: Family Medicine

## 2022-01-27 DIAGNOSIS — E559 Vitamin D deficiency, unspecified: Secondary | ICD-10-CM

## 2022-01-27 DIAGNOSIS — N1832 Chronic kidney disease, stage 3b: Secondary | ICD-10-CM

## 2022-01-27 DIAGNOSIS — E782 Mixed hyperlipidemia: Secondary | ICD-10-CM

## 2022-01-27 LAB — HEPATITIS C ANTIBODY: Hep C Virus Ab: NONREACTIVE

## 2022-01-27 LAB — MICROALBUMIN / CREATININE URINE RATIO
Creatinine, Urine: 195.1 mg/dL
Microalb/Creat Ratio: 21 mg/g creat (ref 0–29)
Microalbumin, Urine: 40.9 ug/mL

## 2022-01-27 MED ORDER — VITAMIN D (ERGOCALCIFEROL) 1.25 MG (50000 UNIT) PO CAPS: 50000.0000 [IU] | ORAL_CAPSULE | ORAL | 3 refills | Status: DC

## 2022-01-27 MED ORDER — ATORVASTATIN CALCIUM 80 MG PO TABS: 80.0000 mg | ORAL_TABLET | Freq: Every day | ORAL | 3 refills | Status: DC

## 2022-01-27 NOTE — Progress Notes (Signed)
Please encourage the patient to increase her fluid consumption to at least 64 ounces daily.  I have sent a prescription for vitamin D once weekly supplement to the pharmacy, because of her low vitamin D levels.  Please instruct the patient to pick up the prescription and start therapy.  Her hemoglobin A1c is 8.6.  I recommend that she starts taking Jardiance '15mg'$  daily, 1 (10 mg) and1/2 (10 mg) tablet daily along with her other diabetic medications.  A referral has been placed to nephrology due to the decline of her kidney function.  I will also increase her atorvastatin to 80 mg daily, the prescription is sent to her pharmacy to start taking.

## 2022-01-31 ENCOUNTER — Encounter: Payer: Self-pay | Admitting: Family Medicine

## 2022-01-31 NOTE — Progress Notes (Signed)
documentation

## 2022-02-06 ENCOUNTER — Other Ambulatory Visit (HOSPITAL_BASED_OUTPATIENT_CLINIC_OR_DEPARTMENT_OTHER): Payer: Self-pay | Admitting: Family Medicine

## 2022-02-06 ENCOUNTER — Other Ambulatory Visit: Payer: Self-pay | Admitting: Dermatology

## 2022-02-06 DIAGNOSIS — L280 Lichen simplex chronicus: Secondary | ICD-10-CM

## 2022-02-07 ENCOUNTER — Other Ambulatory Visit (HOSPITAL_BASED_OUTPATIENT_CLINIC_OR_DEPARTMENT_OTHER): Payer: Self-pay | Admitting: Family Medicine

## 2022-02-08 ENCOUNTER — Encounter: Payer: Self-pay | Admitting: Family Medicine

## 2022-02-09 ENCOUNTER — Telehealth: Payer: Self-pay | Admitting: Family Medicine

## 2022-02-09 ENCOUNTER — Other Ambulatory Visit: Payer: Self-pay | Admitting: Family Medicine

## 2022-02-09 DIAGNOSIS — F32A Depression, unspecified: Secondary | ICD-10-CM

## 2022-02-09 DIAGNOSIS — G4700 Insomnia, unspecified: Secondary | ICD-10-CM

## 2022-02-09 MED ORDER — ZOLPIDEM TARTRATE ER 12.5 MG PO TBCR: 12.5000 mg | EXTENDED_RELEASE_TABLET | Freq: Every day | ORAL | 0 refills | Status: DC

## 2022-02-09 MED ORDER — LORAZEPAM 1 MG PO TABS: 1.0000 mg | ORAL_TABLET | ORAL | 0 refills | Status: DC | PRN

## 2022-02-09 NOTE — Telephone Encounter (Signed)
Patient called need refill  LORazepam (ATIVAN) 1 MG tablet [[id number]   zolpidem (AMBIEN CR) 12.5 MG CR table   Pharmacy: [place]

## 2022-02-10 ENCOUNTER — Other Ambulatory Visit: Payer: Self-pay | Admitting: Family Medicine

## 2022-02-10 NOTE — Telephone Encounter (Signed)
The medications were refilled yesterday; kindly contact the patient's pharmacy to verify if the medication was received.

## 2022-02-10 NOTE — Telephone Encounter (Signed)
Confirmed with the pharmacy, pt picked up those prescriptions yesterday?

## 2022-02-20 ENCOUNTER — Ambulatory Visit (INDEPENDENT_AMBULATORY_CARE_PROVIDER_SITE_OTHER): Payer: Medicaid Other | Admitting: Podiatry

## 2022-02-20 ENCOUNTER — Other Ambulatory Visit: Payer: Self-pay | Admitting: Family Medicine

## 2022-02-20 ENCOUNTER — Encounter: Payer: Self-pay | Admitting: Family Medicine

## 2022-02-20 DIAGNOSIS — F419 Anxiety disorder, unspecified: Secondary | ICD-10-CM

## 2022-02-20 DIAGNOSIS — Z91199 Patient's noncompliance with other medical treatment and regimen due to unspecified reason: Secondary | ICD-10-CM

## 2022-02-20 DIAGNOSIS — R112 Nausea with vomiting, unspecified: Secondary | ICD-10-CM

## 2022-02-20 MED ORDER — PROMETHAZINE HCL 25 MG PO TABS: 25.0000 mg | ORAL_TABLET | ORAL | 0 refills | Status: DC | PRN

## 2022-02-20 NOTE — Telephone Encounter (Signed)
Phenergan and lorazepam has been refilled

## 2022-02-21 ENCOUNTER — Other Ambulatory Visit: Payer: Self-pay | Admitting: Family Medicine

## 2022-02-21 NOTE — Progress Notes (Signed)
Patient was no-show for appointment today 

## 2022-02-22 ENCOUNTER — Encounter: Payer: Self-pay | Admitting: Internal Medicine

## 2022-02-22 ENCOUNTER — Ambulatory Visit: Payer: Medicaid Other | Admitting: Internal Medicine

## 2022-02-22 DIAGNOSIS — F419 Anxiety disorder, unspecified: Secondary | ICD-10-CM

## 2022-02-22 DIAGNOSIS — F32A Depression, unspecified: Secondary | ICD-10-CM | POA: Diagnosis not present

## 2022-02-22 MED ORDER — LORAZEPAM 1 MG PO TABS: 1.0000 mg | ORAL_TABLET | ORAL | 0 refills | Status: DC | PRN

## 2022-02-22 MED ORDER — BREXPIPRAZOLE 1 MG PO TABS: 1.0000 mg | ORAL_TABLET | Freq: Every day | ORAL | 0 refills | Status: DC

## 2022-02-22 NOTE — Patient Instructions (Signed)
Thank you for trusting me with your care. To recap, today we discussed the following:  Medications refilled You should be established with new psychiatrist over the next month - LORazepam (ATIVAN) 1 MG tablet; Take 1 tablet (1 mg total) by mouth as needed. for anxiety  Dispense: 90 tablet; Refill: 0 - brexpiprazole (REXULTI) 1 MG TABS tablet; Take 1 tablet (1 mg total) by mouth daily.  Dispense: 30 tablet; Refill: 0

## 2022-02-22 NOTE — Progress Notes (Addendum)
     CC: Medication Refill (States she is out of lorazepam, she was taking it tid but only received 20 on her last refill and she has not been feeling good. Very sluggish )    HPI:Kathryn Oconnell is a 56 y.o. female who presents for evaluation of anxiety. For the details of today's visit, please refer to the assessment and plan.   Past Medical History:  Diagnosis Date   Anxiety    ASCUS of cervix with negative high risk HPV 05/14/2020   05/2020 ASCUS negative HPV, repeat pap in 3 years with HPV, 5 year CIN3+ risk is 0.27% per ASCCP guidelines    Chronic kidney disease    COPD (chronic obstructive pulmonary disease) ([place])    Depression    Diabetes mellitus without complication ([place])    Eczema    Graves disease    HSV-2 infection    Hyperlipidemia    Hypertension    Hypothyroid    Lung nodule    Menopausal vaginal dryness 02/24/2014   Migraines    Neuropathy    Tobacco use    Trouble in sleeping 06/25/2015     Physical Exam: Vitals:   02/22/22 1517  BP: 121/78  Pulse: 87  Resp: 16  SpO2: 95%  Weight: 141 lb (64 kg)  Height: '5\' 4"'$  (1.626 m)     Physical Exam Cardiovascular:     Rate and Rhythm: Normal rate and regular rhythm.  Pulmonary:     Effort: Pulmonary effort is normal.     Breath sounds: Normal breath sounds.  Psychiatric:        Thought Content: Thought content normal.     Comments: Mood: anxious Affect: anxious      Assessment & Plan:   Anxiety and depression Patient present with increased anxiety. She ran out of lorazepam because she received #20 tablets and not usual #90 tablets per month. This was verified on PDMP review. Long history of treatment with Lorazepam. Gave patient education on risk of long term benzodiazepine and recommended discussing other strategies/medications for anxiety/depression with new psychiatrist. In addition she is out of brexpiprazole. Refill these medications for one month as well and patient will have time to establish  with psychiatry.   - LORazepam (ATIVAN) 1 MG tablet; Take 1 tablet (1 mg total) by mouth as needed. for anxiety  Dispense: 90 tablet; Refill: 0 - brexpiprazole (REXULTI) 1 MG TABS tablet; Take 1 tablet (1 mg total) by mouth daily.  Dispense: 30 tablet; Refill: 0 - Ambulatory referral to [facility]   Lorene Dy, MD

## 2022-02-24 NOTE — Assessment & Plan Note (Addendum)
Patient present with increased anxiety. She ran out of lorazepam because she received #20 tablets and not usual #90 tablets per month. This was verified on PDMP review. Long history of treatment with Lorazepam. Gave patient education on risk of long term benzodiazepine and recommended discussing other strategies/medications for anxiety/depression with new psychiatrist. In addition she is out of brexpiprazole. Refill these medications for one month as well and patient will have time to establish with psychiatry.   - LORazepam (ATIVAN) 1 MG tablet; Take 1 tablet (1 mg total) by mouth as needed. for anxiety  Dispense: 90 tablet; Refill: 0 - brexpiprazole (REXULTI) 1 MG TABS tablet; Take 1 tablet (1 mg total) by mouth daily.  Dispense: 30 tablet; Refill: 0 - Ambulatory referral to [facility]

## 2022-02-28 ENCOUNTER — Telehealth: Payer: Self-pay | Admitting: Family Medicine

## 2022-02-28 MED ORDER — LORAZEPAM 1 MG PO TABS: 1.0000 mg | ORAL_TABLET | Freq: Three times a day (TID) | ORAL | 0 refills | Status: DC | PRN

## 2022-02-28 NOTE — Telephone Encounter (Signed)
Pt states lorazepam was called in as once daily, and takes medication tid wants to have the rx corrected, please advice?

## 2022-02-28 NOTE — Addendum Note (Signed)
Addended by: Lyndal Pulley on: 02/28/2022 04:49 PM   Modules accepted: Orders

## 2022-02-28 NOTE — Telephone Encounter (Signed)
Patient called said Dr Court Joy sent in a prescription last week and  the pharmacy unable to fill due to the way it was written. 1 tablet by mouth as needed.  Needs to read: Take 1 tablet tid   Pharmacy  [id number] [id number]: [phone] Fax: [phone] #: [id number]

## 2022-03-05 ENCOUNTER — Other Ambulatory Visit: Payer: Self-pay | Admitting: Family Medicine

## 2022-03-05 ENCOUNTER — Other Ambulatory Visit (HOSPITAL_BASED_OUTPATIENT_CLINIC_OR_DEPARTMENT_OTHER): Payer: Self-pay | Admitting: Family Medicine

## 2022-03-05 ENCOUNTER — Other Ambulatory Visit: Payer: Self-pay | Admitting: Dermatology

## 2022-03-05 DIAGNOSIS — G4700 Insomnia, unspecified: Secondary | ICD-10-CM

## 2022-03-05 DIAGNOSIS — L280 Lichen simplex chronicus: Secondary | ICD-10-CM

## 2022-03-09 NOTE — Telephone Encounter (Signed)
Kindly asked the patient to schedule an office visit for medication refill

## 2022-03-13 ENCOUNTER — Ambulatory Visit: Payer: Medicaid Other | Admitting: Family Medicine

## 2022-03-14 ENCOUNTER — Telehealth: Payer: Self-pay | Admitting: Family Medicine

## 2022-03-14 NOTE — Telephone Encounter (Signed)
Patient returning call.

## 2022-03-14 NOTE — Telephone Encounter (Signed)
Referral was placed to psychiatry on 01/24/2022. Dr. Court Joy placed a referral to integrated behavioral health on 02/22/2022 Kindly encouraged the patient to follow-up with the referrals placed There were no encounters of her most recent visit to the psychiatrists in epic,Kindly encouraged the patient to follow-up with the referrals placed

## 2022-03-14 NOTE — Telephone Encounter (Signed)
Patient called said was referred to behavioral health and they do not prescribe benzo or Ativan what does patient need to do. Please return patient call.

## 2022-03-14 NOTE — Telephone Encounter (Signed)
When did she follow up with behavioral heath?

## 2022-03-15 ENCOUNTER — Telehealth: Payer: Self-pay | Admitting: Family Medicine

## 2022-03-15 NOTE — Telephone Encounter (Signed)
Called Patient to follow up

## 2022-03-15 NOTE — Telephone Encounter (Signed)
Tried several times to call patient the # would not connect.  I called Daughter to try and get in touch with her to call me.  Only left message for her to call me.

## 2022-03-15 NOTE — Telephone Encounter (Signed)
Talked with patient and gave her the [facility] # at [facility].  Told her she would have to follow up with her to get any kind of BH medication. Also told her they may refer her to another office as well depending on what med she is asking for.

## 2022-03-20 ENCOUNTER — Ambulatory Visit (INDEPENDENT_AMBULATORY_CARE_PROVIDER_SITE_OTHER): Payer: Medicaid Other | Admitting: Licensed Clinical Social Worker

## 2022-03-20 DIAGNOSIS — F32A Depression, unspecified: Secondary | ICD-10-CM | POA: Diagnosis not present

## 2022-03-20 DIAGNOSIS — F419 Anxiety disorder, unspecified: Secondary | ICD-10-CM | POA: Diagnosis not present

## 2022-03-20 NOTE — Telephone Encounter (Signed)
Called pt regarding scheduled mychart visit. Left detailed message requesting callback

## 2022-03-21 IMAGING — MR MR ABDOMEN WO/W CM
11 of 17 series · 28 of 48 positions shown · IV contrast (13 Ml MULTIHANCE)
Comparison: Ultrasound March 02, 2021 and CT May 29, 2014

CLINICAL DATA: Further evaluation of left renal lesion seen on
prior ultrasound.

EXAM:
MRI ABDOMEN WITHOUT AND WITH CONTRAST
TECHNIQUE: Multiplanar multisequence MR imaging of the abdomen was performed
both before and after the administration of intravenous contrast.
CONTRAST:  13mL MULTIHANCE GADOBENATE DIMEGLUMINE 529 MG/ML IV SOLN

[Series 3: cor haste · coronal · 5.0mm · 0.68mm/px · 2 of 32 slices shown]
[im 1/32]
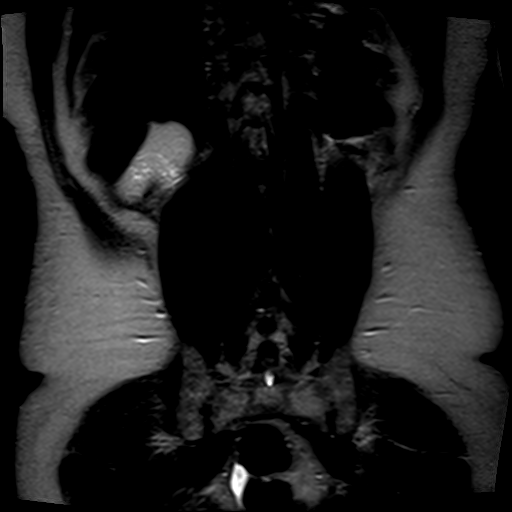
[im 32/32]
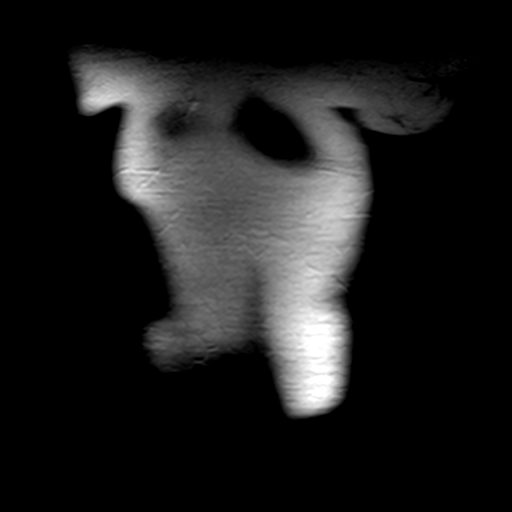

[Series 4: axial haste · axial · 6.0mm · 0.68mm/px · z∈[-93,+119]mm · 2 of 33 slices shown]
[im 1/33]
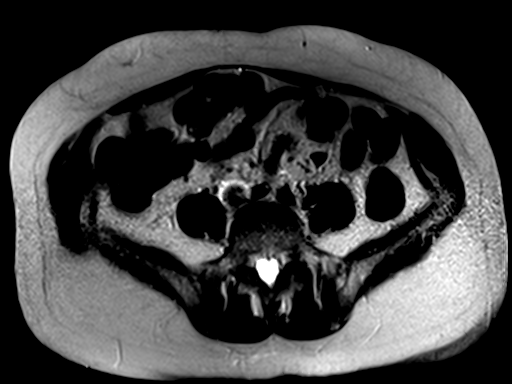
[im 33/33]
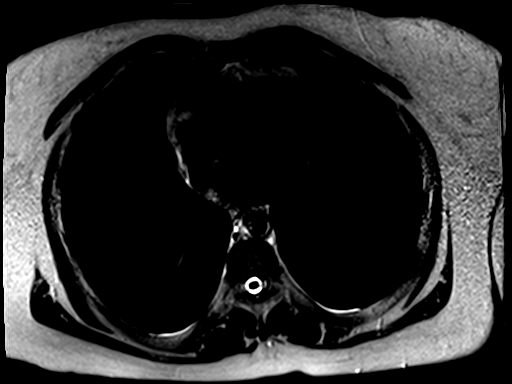

[Series 5: T1 · axial · 6.0mm · 0.68mm/px · z∈[-93,+119]mm · 4 of 66 slices shown]
[im 1/66]
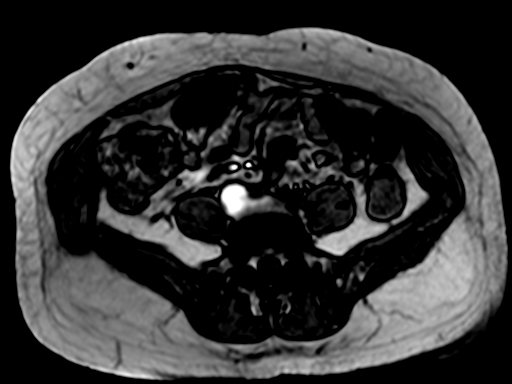
[im 22/66]
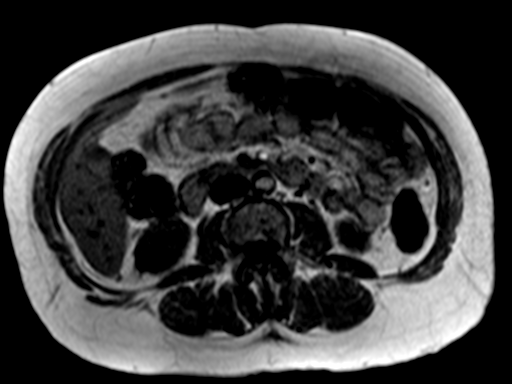
[im 44/66]
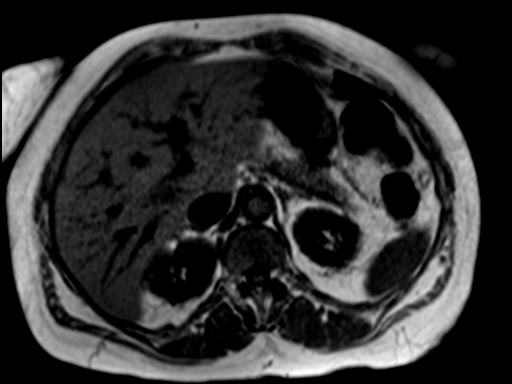
[im 66/66]
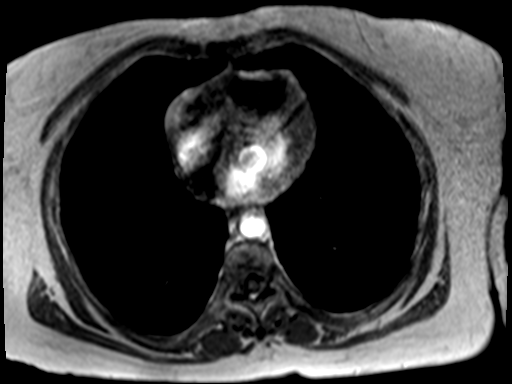

[Series 6: bSSFP · axial · 4.0mm · 0.68mm/px · z∈[-107,+133]mm · 3 of 61 slices shown]
[im 1/61]
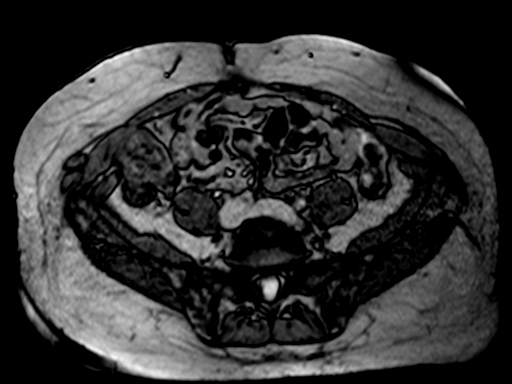
[im 31/61]
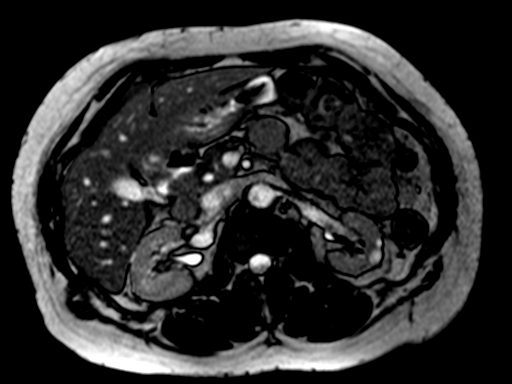
[im 61/61]
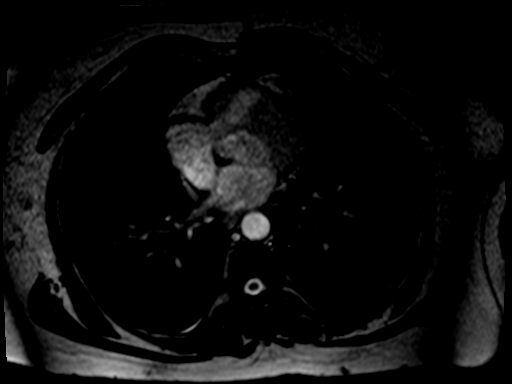

[Series 7: T2 fat-sat · axial · 6.0mm · 1.09mm/px · z∈[-96,+127]mm · 2 of 32 slices shown]
[im 1/32]
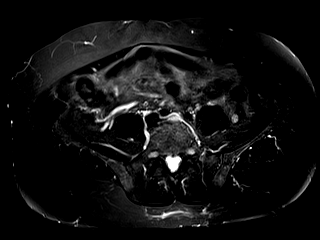
[im 32/32]
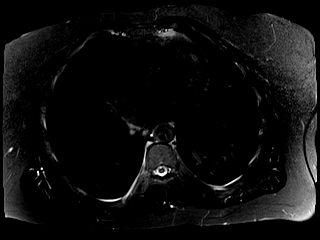

[Series 8: ep2d_[id number]_[id number]_trig · axial · 6.0mm · 1.82mm/px · z∈[-96,+127]mm · 4 of 96 slices shown]
[im 1/96]
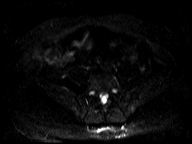
[im 32/96]
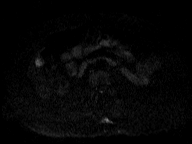
[im 64/96]
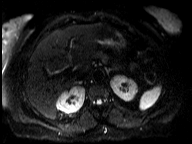
[im 96/96]
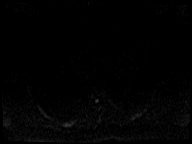

[Series 9: [id number]_trig_adc · axial · 6.0mm · 1.82mm/px · 1 of 32 slices shown]
[im 1/32]
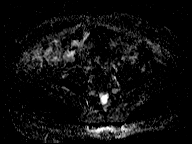

[Series 10: T1 dynamic · axial · non-contrast · 2.5mm · 0.74mm/px · z∈[-96,+122]mm · 3 of 88 slices shown]
[im 1/88]
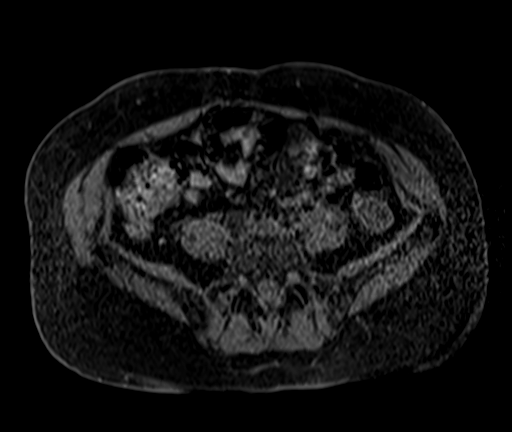
[im 44/88]
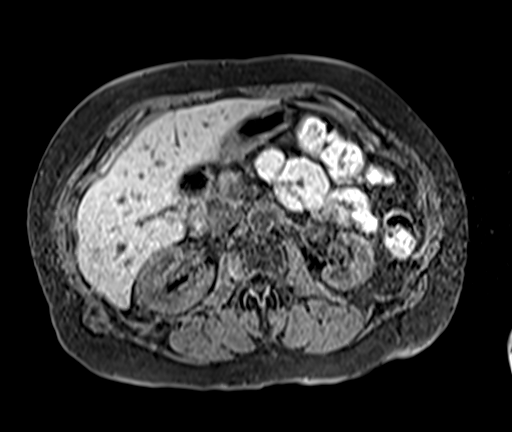
[im 88/88]
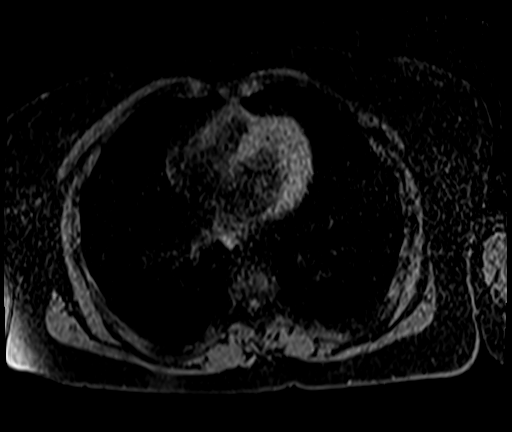

[Series 11: T1 dynamic post-contrast · axial · 2.5mm · 0.74mm/px · z∈[-96,+122]mm · 3 of 88 slices shown (1 of 3)]
[im 1/88]
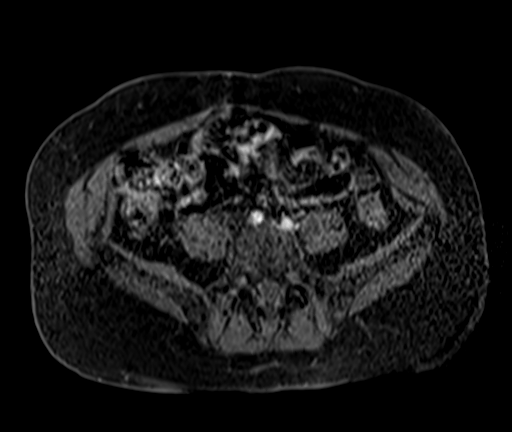
[im 44/88]
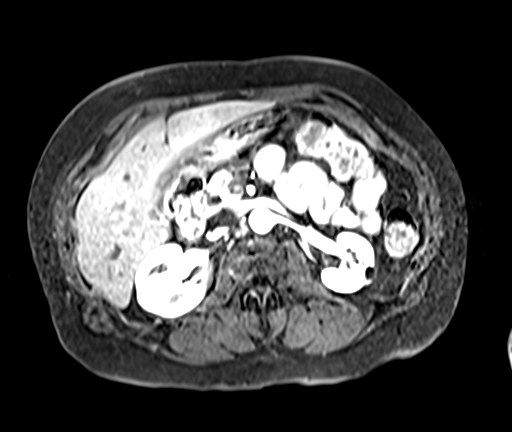
[im 88/88]
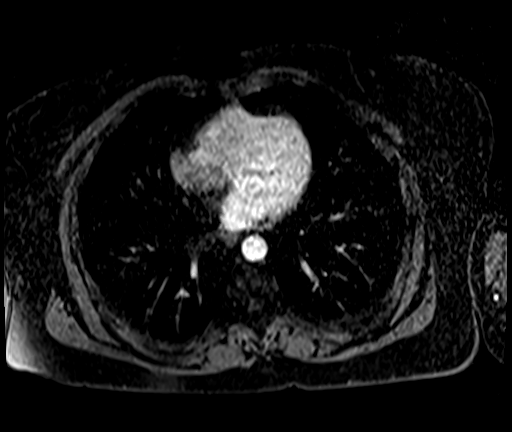

[Series 12: T1 dynamic post-contrast · axial · 2.5mm · 0.74mm/px · z∈[-96,+122]mm · 3 of 88 slices shown (2 of 3)]
[im 1/88]
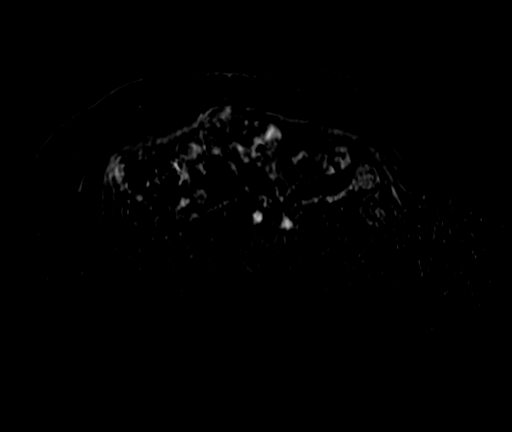
[im 44/88]
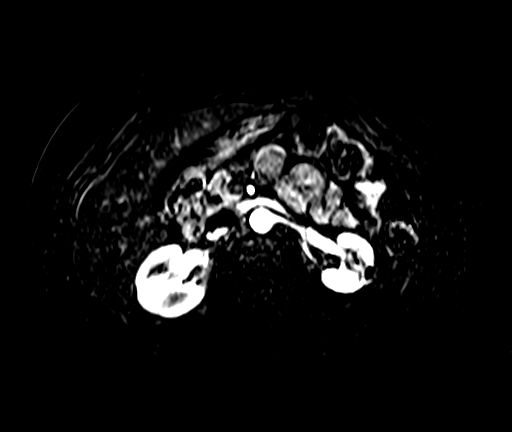
[im 88/88]
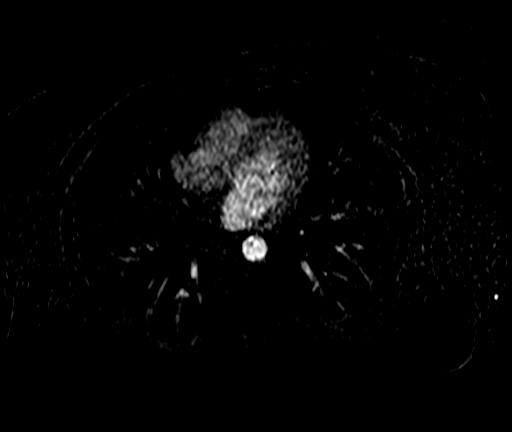

[Series 13: T1 dynamic post-contrast · axial · 2.5mm · 0.74mm/px · 1 of 88 slices shown (3 of 3)]
[im 1/88]
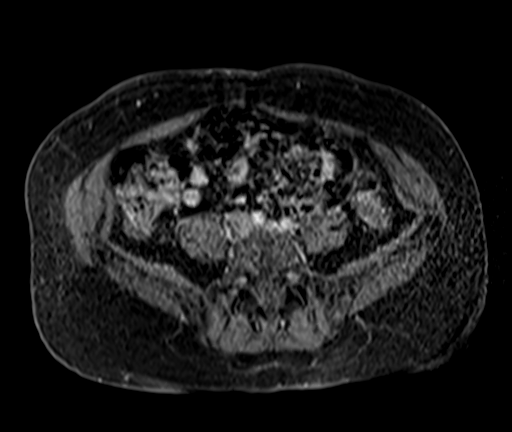

[28 of 48 positions shown; findings below may reference images not displayed]

FINDINGS: Lower chest: No acute findings.

Hepatobiliary: No hepatic steatosis. No suspicious hepatic lesion.
Gallbladder is unremarkable. No biliary ductal dilation.

Pancreas: Intrinsic T1 signal of the pancreatic parenchyma is within
normal limits. Homogeneous enhancement pancreatic parenchyma. No
pancreatic ductal dilation. No cystic or solid hyperenhancing
pancreatic lesion identified.

Spleen:  Within normal limits in size and appearance.

Adrenals/Urinary Tract:  Bilateral adrenal glands are unremarkable.

Corresponding with finding on prior ultrasound are 2 adjacent left
lower/interpolar fluid smooth wall renal lesions the largest of
which measures 14 mm on image [DATE] with the smaller adjacent lesion
measuring 6 mm on image [DATE], these do not demonstrate suspicious
postcontrast enhancement and are consistent with benign Bosniak
classification 1 renal cysts.

Intrinsically homogeneously T1 hyperintense 1 cm right lower pole
renal lesion on image 57/10 which is mildly T2 hyperintense and does
not demonstrate postcontrast enhancement consistent with a benign
Bosniak classification 2 renal cysts.

Additional T1 hyperintense 6 mm left upper pole renal lesion on
image [DATE] is difficult to accurately characterize given its small
size and location but does not definitely demonstrate postcontrast
enhancement and likely reflects a benign Bosniak classification 2
renal cyst.

No solid enhancing renal masses.

Stomach/Bowel: Visualized portions within the abdomen are
unremarkable.

Vascular/Lymphatic: No pathologically enlarged lymph nodes
identified. No abdominal aortic aneurysm demonstrated.

Other:  No abdominal ascites.

Musculoskeletal: No suspicious bone lesions identified.
IMPRESSION: Bilateral benign Bosniak classification 1 and 2 renal cysts some
which are hemorrhagic/proteinaceous. No solid enhancing renal mass.

## 2022-03-21 NOTE — BH Specialist Note (Signed)
Integrated Behavioral Health via Telemedicine Visit  03/21/2022 QUANIYAH BUGH [id number]  Number of [facility] Clinician visits: 1 Session Start time:  130pm Session End time: 214pm Total time in minutes: 44 mins via phone per pt request   Referring Provider: Verne Grain NP Patient/Family location: Home  [facility] Provider location: [place]  All persons participating in visit: Pt T Riebel and LCSW A. Jnyah Brazee  Types of Service: Individual psychotherapy and Telephone visit  I connected with [name] and/or Dexter n/a via  Telephone or Video Enabled Telemedicine Application  (Video is Caregility application) and verified that I am speaking with the correct person using two identifiers. Discussed confidentiality: Yes   I discussed the limitations of telemedicine and the availability of in person appointments.  Discussed there is a possibility of technology failure and discussed alternative modes of communication if that failure occurs.  I discussed that engaging in this telemedicine visit, they consent to the provision of behavioral healthcare and the services will be billed under their insurance.  Patient and/or legal guardian expressed understanding and consented to Telemedicine visit: Yes   Presenting Concerns: Patient and/or family reports the following symptoms/concerns: depression  Duration of problem: over one year; Severity of problem: mild  Patient and/or Family's Strengths/Protective Factors: Concrete supports in place (healthy food, safe environments, etc.)  Goals Addressed: Patient will:  Reduce symptoms of: depression   Increase knowledge and/or ability of: coping skills   Demonstrate ability to: Increase healthy adjustment to current life circumstances  Progress towards Goals: Ongoing  Interventions: Interventions utilized:  Motivational Interviewing and Supportive Counseling Standardized Assessments completed: Not Needed  Patient  and/or Family Response: Ms. Malee reports depressed mood and social isolation due to failed relationships.  Assessment: Patient currently experiencing major depressive disorder.   Patient may benefit from integrated behavioral health.  Plan: Follow up with behavioral health clinician on : 3 weeks Behavioral recommendations: Reduce social isolation, counteract negative thought patterns and practice positive affirmations  Referral(s): [place] (In Clinic)  I discussed the assessment and treatment plan with the patient and/or parent/guardian. They were provided an opportunity to ask questions and all were answered. They agreed with the plan and demonstrated an understanding of the instructions.   They were advised to call back or seek an in-person evaluation if the symptoms worsen or if the condition fails to improve as anticipated.  Lynnea Ferrier, LCSW

## 2022-03-26 ENCOUNTER — Other Ambulatory Visit (HOSPITAL_BASED_OUTPATIENT_CLINIC_OR_DEPARTMENT_OTHER): Payer: Self-pay | Admitting: Family Medicine

## 2022-03-26 DIAGNOSIS — E039 Hypothyroidism, unspecified: Secondary | ICD-10-CM

## 2022-03-30 ENCOUNTER — Ambulatory Visit: Payer: Medicaid Other | Admitting: Family Medicine

## 2022-04-04 LAB — HEMOGLOBIN A1C: Hemoglobin A1C: 7.4

## 2022-04-05 ENCOUNTER — Ambulatory Visit: Payer: Medicaid Other | Admitting: Family Medicine

## 2022-04-05 ENCOUNTER — Encounter: Payer: Medicaid Other | Admitting: Licensed Clinical Social Worker

## 2022-04-08 ENCOUNTER — Other Ambulatory Visit: Payer: Self-pay | Admitting: Family Medicine

## 2022-04-08 DIAGNOSIS — F32A Depression, unspecified: Secondary | ICD-10-CM

## 2022-04-08 DIAGNOSIS — R112 Nausea with vomiting, unspecified: Secondary | ICD-10-CM

## 2022-04-10 ENCOUNTER — Other Ambulatory Visit: Payer: Self-pay | Admitting: Family Medicine

## 2022-04-10 DIAGNOSIS — R112 Nausea with vomiting, unspecified: Secondary | ICD-10-CM

## 2022-04-10 DIAGNOSIS — F32A Depression, unspecified: Secondary | ICD-10-CM

## 2022-04-10 MED ORDER — LORAZEPAM 1 MG PO TABS: 1.0000 mg | ORAL_TABLET | Freq: Three times a day (TID) | ORAL | 0 refills | Status: AC | PRN

## 2022-04-10 MED ORDER — PROMETHAZINE HCL 25 MG PO TABS: 25.0000 mg | ORAL_TABLET | ORAL | 0 refills | Status: DC | PRN

## 2022-04-10 NOTE — Telephone Encounter (Signed)
Rx sent 

## 2022-04-13 NOTE — Telephone Encounter (Signed)
Can you close this note?

## 2022-04-13 NOTE — Telephone Encounter (Signed)
Patient had an appt. Front or clinical can not close the note

## 2022-04-13 NOTE — Telephone Encounter (Signed)
Kindly inform me of the note you're referring to

## 2022-04-17 ENCOUNTER — Other Ambulatory Visit: Payer: Self-pay | Admitting: Family Medicine

## 2022-04-19 ENCOUNTER — Ambulatory Visit: Payer: Medicaid Other | Admitting: Podiatry

## 2022-04-20 ENCOUNTER — Ambulatory Visit: Payer: Medicaid Other | Admitting: Dermatology

## 2022-05-01 ENCOUNTER — Ambulatory Visit (INDEPENDENT_AMBULATORY_CARE_PROVIDER_SITE_OTHER): Payer: Medicaid Other | Admitting: Licensed Clinical Social Worker

## 2022-05-01 DIAGNOSIS — F32A Depression, unspecified: Secondary | ICD-10-CM | POA: Diagnosis not present

## 2022-05-01 DIAGNOSIS — F419 Anxiety disorder, unspecified: Secondary | ICD-10-CM | POA: Diagnosis not present

## 2022-05-01 NOTE — BH Specialist Note (Unsigned)
Integrated Behavioral Health via Telemedicine Visit  05/02/2022 Kathryn Oconnell [id number]  Number of [facility] Clinician visits: 1 Session Start time:  2:32pm Session End time: 3:12 Total time in minutes: 40 mins via mychart video and phone due to poor connection on mychart   Referring Provider: Verne Grain NP Patient/Family location: Home  [facility] Provider location: [place]  All persons participating in visit: Pt Kathryn Oconnell and LCSW A Linton Rump  Types of Service: Telephone visit  I connected with Kathryn Oconnell and/or Kathryn Oconnell via  Telephone or Video Enabled Telemedicine Application  (Video is Caregility application) and verified that I am speaking with the correct person using two identifiers. Discussed confidentiality: Yes   I discussed the limitations of telemedicine and the availability of in person appointments.  Discussed there is a possibility of technology failure and discussed alternative modes of communication if that failure occurs.  I discussed that engaging in this telemedicine visit, they consent to the provision of behavioral healthcare and the services will be billed under their insurance.  Patient and/or legal guardian expressed understanding and consented to Telemedicine visit: Yes   Presenting Concerns: Patient and/or family reports the following symptoms/concerns: depressed mood and social isolation  Duration of problem: over one year ; Severity of problem: mild  Patient and/or Family's Strengths/Protective Factors: Concrete supports in place (healthy food, safe environments, etc.)  Goals Addressed: Patient will:  Reduce symptoms of: depression   Increase knowledge and/or ability of: coping skills   Demonstrate ability to: Increase healthy adjustment to current life circumstances  Progress towards Goals: Ongoing  Interventions: Interventions utilized:  Supportive Counseling Standardized Assessments completed: Not Needed  Patient  and/or Family Response: Kathryn Oconnell recently moved in with son due to selling her home. Kathryn Oconnell report social isolation due to nothing interest her in [place], [place]. Kathryn Oconnell reports she being helpful with son.   Assessment: Patient currently experiencing depression.   Patient may benefit from integrated behavioral health.  Plan: Follow up with behavioral health clinician on : 3 weeks  Behavioral recommendations: Reduce social isolation, counteract negative thought patterns and practice positive affirmations . Referral(s): [place] (In Clinic)  I discussed the assessment and treatment plan with the patient and/or parent/guardian. They were provided an opportunity to ask questions and all were answered. They agreed with the plan and demonstrated an understanding of the instructions.   They were advised to call back or seek an in-person evaluation if the symptoms worsen or if the condition fails to improve as anticipated.  Lynnea Ferrier, LCSW

## 2022-05-02 ENCOUNTER — Ambulatory Visit: Payer: Medicaid Other | Admitting: Family Medicine

## 2022-05-15 ENCOUNTER — Ambulatory Visit: Payer: Medicaid Other | Admitting: Licensed Clinical Social Worker

## 2022-05-15 DIAGNOSIS — Z91199 Patient's noncompliance with other medical treatment and regimen due to unspecified reason: Secondary | ICD-10-CM

## 2022-05-15 NOTE — BH Specialist Note (Signed)
Called pt twice regarding mychart visit. Unable to reach pt via phone

## 2022-05-19 ENCOUNTER — Other Ambulatory Visit: Payer: Self-pay | Admitting: Family Medicine

## 2022-05-19 DIAGNOSIS — R112 Nausea with vomiting, unspecified: Secondary | ICD-10-CM

## 2022-05-21 ENCOUNTER — Other Ambulatory Visit: Payer: Self-pay | Admitting: Family Medicine

## 2022-05-21 DIAGNOSIS — E559 Vitamin D deficiency, unspecified: Secondary | ICD-10-CM

## 2022-05-22 ENCOUNTER — Other Ambulatory Visit: Payer: Self-pay | Admitting: Family Medicine

## 2022-05-22 DIAGNOSIS — R112 Nausea with vomiting, unspecified: Secondary | ICD-10-CM

## 2022-05-22 MED ORDER — PROMETHAZINE HCL 25 MG PO TABS: 25.0000 mg | ORAL_TABLET | ORAL | 0 refills | Status: DC | PRN

## 2022-05-22 NOTE — Telephone Encounter (Signed)
Rx sent 

## 2022-05-31 ENCOUNTER — Other Ambulatory Visit (HOSPITAL_BASED_OUTPATIENT_CLINIC_OR_DEPARTMENT_OTHER): Payer: Self-pay | Admitting: Family Medicine

## 2022-06-05 ENCOUNTER — Ambulatory Visit: Payer: Medicaid Other | Admitting: Adult Health

## 2022-06-06 ENCOUNTER — Telehealth: Payer: Self-pay

## 2022-06-06 ENCOUNTER — Ambulatory Visit: Payer: Medicaid Other | Admitting: Dermatology

## 2022-06-06 NOTE — Telephone Encounter (Signed)
LVM for patient offering her appt 06/07/22 at 1:30 per Dr. Laurence Ferrari. Kathryn Oconnell., RMA

## 2022-06-07 ENCOUNTER — Telehealth: Payer: Self-pay

## 2022-06-07 NOTE — Telephone Encounter (Signed)
Patient called back to the office today to schedule appointment. She is scheduled for 06/29/22. She left a message on the nurse voicemail line that the darkness on her face has returned and she would like to know can we RF any medication for her while waiting to be seen?

## 2022-06-07 NOTE — Telephone Encounter (Signed)
We left a voicemail yesterday offering her an appointment today at 1:30 if she can come. Otherwise ok to send tacrolimus 0.1% ointment to use twice a day to affected areas. Recommend good sun protection with SPF 45+ and avoid anything that seems to irritate the skin. Thank you!

## 2022-06-07 NOTE — Telephone Encounter (Signed)
It looks like that is filled, but I could see her tomorrow at 12:10 if she is available then. Thank you!

## 2022-06-07 NOTE — Telephone Encounter (Signed)
Appointment scheduled. aw

## 2022-06-08 ENCOUNTER — Ambulatory Visit: Payer: Medicaid Other | Admitting: Dermatology

## 2022-06-08 ENCOUNTER — Encounter: Payer: Self-pay | Admitting: Dermatology

## 2022-06-08 VITALS — BP 125/80 | HR 74

## 2022-06-08 DIAGNOSIS — R21 Rash and other nonspecific skin eruption: Secondary | ICD-10-CM

## 2022-06-08 DIAGNOSIS — L438 Other lichen planus: Secondary | ICD-10-CM | POA: Diagnosis not present

## 2022-06-08 MED ORDER — TRIAMCINOLONE ACETONIDE 0.025 % EX CREA: TOPICAL_CREAM | CUTANEOUS | 0 refills | Status: DC

## 2022-06-08 MED ORDER — TACROLIMUS 0.1 % EX OINT: TOPICAL_OINTMENT | Freq: Two times a day (BID) | CUTANEOUS | 1 refills | Status: DC

## 2022-06-08 NOTE — Progress Notes (Signed)
   Follow-Up Visit   Subjective  Kathryn Oconnell is a 57 y.o. 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 Patient using clobetasol at elbows twice daily but advises it is not helping and skin is very thin. She is using tacrolimus at face. ).  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  Patient was using tacrolimus at face then stopped November. Then started getting worse about 1 month ago and restarted tacrolimus about 2 weeks ago.  The following portions of the chart were reviewed this encounter and updated as appropriate:   Tobacco  Allergies  Meds  Problems  Med Hx  Surg Hx  Fam Hx      Review of Systems:  No other skin or systemic complaints except as noted in HPI or Assessment and Plan.  Objective  Well appearing patient in no apparent distress; mood and affect are within normal limits.  A focused examination was performed including face, arms. Relevant physical exam findings are noted in the Assessment and Plan.  elbows Hyperpigmented lichenified plaques  face Hyperpigmented macules coalescing to patches at face    Assessment & Plan  Rash elbows  Discontinue clobetasol. Start tacrolimus twice daily. Sample of Zoryve given to patient to use once daily. Follow with tacrolimus.   No joint pain.   Related Medications mupirocin ointment (BACTROBAN) 2 % Apply topically daily.  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 Condition is bothersome/symptomatic for patient. Currently flared.  Biopsy suggested LPP and clinically consistent  She stopped tacrolimus ointment in November and it flared a few weeks ago. Restarting tacrolimus is helping a little but not much.  Start TMC 0.025% cream twice daily for 1 week to dark spots at face then switch back to tacrolimus twice  daily.   May consider Opzelura sample at f/u.  Recommend daily sunscreen.   Reviewed no cure, only control. Discussed avoiding triggers/irritation.   triamcinolone (KENALOG) 0.025 % cream - face Apply twice daily for 1 week to dark spots at face.  tacrolimus (PROTOPIC) 0.1 % ointment - face Apply topically 2 (two) times daily.   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  Graciella Belton, RMA, am acting as scribe for Forest Gleason, MD .  Documentation: I have reviewed the above documentation for accuracy and completeness, and I agree with the above.  Forest Gleason, MD

## 2022-06-08 NOTE — Patient Instructions (Addendum)
Start triamcinolone 0.025% cream twice daily for 1 week to dark spots at face then switch back to tacrolimus twice daily.   Discontinue clobetasol at elbows.  Start sample of Zoryve once daily and follow with tacrolimus.  Use tacrolimus twice daily.   Some Recommended Sunscreens Include:  Face Sunscreen Available in Different Tints Recommend [employer] CC+ Cream with SPF 50+ Or bareMinerals Complexion Rescue Tinted Hydrating Gel Cream Broad Spectrum SPF 30  UnSun mineral tinted (comes in medium/dark and light/medium)  Due to recent changes in healthcare laws, you may see results of your pathology and/or laboratory studies on MyChart before the doctors have had a chance to review them. We understand that in some cases there may be results that are confusing or concerning to you. Please understand that not all results are received at the same time and often the doctors may need to interpret multiple results in order to provide you with the best plan of care or course of treatment. Therefore, we ask that you please give [place] 2 business days to thoroughly review all your results before contacting the office for clarification. Should we see a critical lab result, you will be contacted sooner.   If You Need Anything After Your Visit  If you have any questions or concerns for your doctor, please call our main line at [phone] and press option 4 to reach your doctor's medical assistant. If no one answers, please leave a voicemail as directed and we will return your call as soon as possible. Messages left after 4 pm will be answered the following business day.   You may also send [place] a message via [place]. We typically respond to MyChart messages within 1-2 business days.  For prescription refills, please ask your pharmacy to contact our office. Our fax number is [phone].  If you have an urgent issue when the clinic is closed that cannot wait until the next business day, you can page your  doctor at the number below.    Please note that while we do our best to be available for urgent issues outside of office hours, we are not available 24/7.   If you have an urgent issue and are unable to reach [place], you may choose to seek medical care at your doctor's office, retail clinic, urgent care center, or emergency room.  If you have a medical emergency, please immediately call 911 or go to the emergency department.  Pager Numbers  - Dr. Nehemiah Massed: [phone]  - Dr. Laurence Ferrari: [phone]  - Dr. Nicole Kindred: [phone]  In the event of inclement weather, please call our main line at [phone] for an update on the status of any delays or closures.  Dermatology Medication Tips: Please keep the boxes that topical medications come in in order to help keep track of the instructions about where and how to use these. Pharmacies typically print the medication instructions only on the boxes and not directly on the medication tubes.   If your medication is too expensive, please contact our office at [phone] option 4 or send [place] a message through [place].   We are unable to tell what your co-pay for medications will be in advance as this is different depending on your insurance coverage. However, we may be able to find a substitute medication at lower cost or fill out paperwork to get insurance to cover a needed medication.   If a prior authorization is required to get your medication covered by your insurance company, please allow [place] 1-2  business days to complete this process.  Drug prices often vary depending on where the prescription is filled and some pharmacies may offer cheaper prices.  The website www.goodrx.com contains coupons for medications through different pharmacies. The prices here do not account for what the cost may be with help from insurance (it may be cheaper with your insurance), but the website can give you the price if you did not use any insurance.  - You can print  the associated coupon and take it with your prescription to the pharmacy.  - You may also stop by our office during regular business hours and pick up a GoodRx coupon card.  - If you need your prescription sent electronically to a different pharmacy, notify our office through [facility] or by phone at [phone] option 4.     Si Usted Necesita Algo Despus de Su Visita  Tambin puede enviarnos un mensaje a travs de Pharmacist, community. Por lo general respondemos a los mensajes de MyChart en el transcurso de 1 a 2 das hbiles.  Para renovar recetas, por favor pida a su farmacia que se ponga en contacto con nuestra oficina. Harland Dingwall de fax es [place] [phone].  Si tiene un asunto urgente cuando la clnica est cerrada y que no puede esperar hasta el siguiente da hbil, puede llamar/localizar a su doctor(a) al nmero que aparece a continuacin.   Por favor, tenga en cuenta que aunque hacemos todo lo posible para estar disponibles para asuntos urgentes fuera del horario de [place], no estamos disponibles las 24 horas del da, los 7 das de la [place].   Si tiene un problema urgente y no puede comunicarse con nosotros, puede optar por buscar atencin mdica  en el consultorio de su doctor(a), en una clnica privada, en un centro de atencin urgente o en una sala de emergencias.  Si tiene Engineering geologist, por favor llame inmediatamente al 911 o vaya a la sala de emergencias.  Nmeros de bper  - Dr. Nehemiah Massed: [phone]  - Dra. Moye: [phone]  - Dra. Nicole Kindred: [phone]  En caso de inclemencias del [place], por favor llame a Johnsie Kindred principal al [phone] para una actualizacin sobre el [place] de cualquier retraso o cierre.  Consejos para la medicacin en dermatologa: Por favor, guarde las cajas en las que vienen los medicamentos de uso tpico para ayudarle a seguir las instrucciones sobre dnde y cmo usarlos. Las farmacias generalmente imprimen las  instrucciones del medicamento slo en las cajas y no directamente en los tubos del [place].   Si su medicamento es muy caro, por favor, pngase en contacto con Zigmund Daniel llamando al [phone] y presione la opcin 4 o envenos un mensaje a travs de Pharmacist, community.   No podemos decirle cul ser su copago por los medicamentos por adelantado ya que esto es diferente dependiendo de la cobertura de su seguro. Sin embargo, es posible que podamos encontrar un medicamento sustituto a Electrical engineer un formulario para que el seguro cubra el medicamento que se considera necesario.   Si se requiere una autorizacin previa para que su compaa de seguros [place] su medicamento, por favor permtanos de 1 a 2 das hbiles para completar este proceso.  Los precios de los medicamentos varan con frecuencia dependiendo del Environmental consultant de dnde se surte la receta y alguna farmacias pueden ofrecer precios ms baratos.  El sitio web www.goodrx.com tiene cupones para medicamentos de Airline pilot. Los precios aqu no tienen en cuenta lo que podra costar con  la ayuda del seguro (puede ser ms barato con su seguro), pero el sitio web puede darle el precio si no Field seismologist.  - Puede imprimir el cupn correspondiente y llevarlo con su receta a la farmacia.  - Tambin puede pasar por nuestra oficina durante el horario de atencin regular y Charity fundraiser una tarjeta de cupones de GoodRx.  - Si necesita que su receta se enve electrnicamente a una farmacia diferente, informe a nuestra oficina a travs de MyChart de [place] o por telfono llamando al [phone] y presione la opcin 4.

## 2022-06-11 ENCOUNTER — Encounter: Payer: Self-pay | Admitting: Dermatology

## 2022-06-16 ENCOUNTER — Other Ambulatory Visit (HOSPITAL_BASED_OUTPATIENT_CLINIC_OR_DEPARTMENT_OTHER): Payer: Self-pay | Admitting: Family Medicine

## 2022-06-16 ENCOUNTER — Other Ambulatory Visit: Payer: Self-pay | Admitting: Dermatology

## 2022-06-16 ENCOUNTER — Other Ambulatory Visit: Payer: Self-pay | Admitting: Family Medicine

## 2022-06-16 ENCOUNTER — Other Ambulatory Visit: Payer: Self-pay | Admitting: Adult Health

## 2022-06-16 DIAGNOSIS — R112 Nausea with vomiting, unspecified: Secondary | ICD-10-CM

## 2022-06-16 DIAGNOSIS — E039 Hypothyroidism, unspecified: Secondary | ICD-10-CM

## 2022-06-16 DIAGNOSIS — L438 Other lichen planus: Secondary | ICD-10-CM

## 2022-06-16 DIAGNOSIS — L661 Lichen planopilaris: Secondary | ICD-10-CM

## 2022-06-29 ENCOUNTER — Encounter: Payer: Self-pay | Admitting: Dermatology

## 2022-06-29 ENCOUNTER — Ambulatory Visit: Payer: Medicaid Other | Admitting: Dermatology

## 2022-06-29 VITALS — BP 112/75 | HR 94

## 2022-06-29 DIAGNOSIS — L818 Other specified disorders of pigmentation: Secondary | ICD-10-CM

## 2022-06-29 DIAGNOSIS — L661 Lichen planopilaris: Secondary | ICD-10-CM | POA: Diagnosis not present

## 2022-06-29 DIAGNOSIS — L438 Other lichen planus: Secondary | ICD-10-CM

## 2022-06-29 NOTE — Patient Instructions (Addendum)
For darkened areas of elbows   Glycolix  Kp treatment - apply to elbows twice daily.  Follow with opzelura cream and tacrolimus ointment   For Rash at elbows and Lichen Planus at Face  Can start opzelura cream 1.5 % - apply thin layer topically twice daily as needed     For Face  Start triamcinolone cream  0.025% cream twice daily for 1 week to dark spots at face then switch back to tacrolimus twice daily.     Due to recent changes in healthcare laws, you may see results of your pathology and/or laboratory studies on MyChart before the doctors have had a chance to review them. We understand that in some cases there may be results that are confusing or concerning to you. Please understand that not all results are received at the same time and often the doctors may need to interpret multiple results in order to provide you with the best plan of care or course of treatment. Therefore, we ask that you please give [place] 2 business days to thoroughly review all your results before contacting the office for clarification. Should we see a critical lab result, you will be contacted sooner.   If You Need Anything After Your Visit  If you have any questions or concerns for your doctor, please call our main line at [phone] and press option 4 to reach your doctor's medical assistant. If no one answers, please leave a voicemail as directed and we will return your call as soon as possible. Messages left after 4 pm will be answered the following business day.   You may also send [place] a message via [place]. We typically respond to MyChart messages within 1-2 business days.  For prescription refills, please ask your pharmacy to contact our office. Our fax number is [phone].  If you have an urgent issue when the clinic is closed that cannot wait until the next business day, you can page your doctor at the number below.    Please note that while we do our best to be available for urgent issues  outside of office hours, we are not available 24/7.   If you have an urgent issue and are unable to reach [place], you may choose to seek medical care at your doctor's office, retail clinic, urgent care center, or emergency room.  If you have a medical emergency, please immediately call 911 or go to the emergency department.  Pager Numbers  - Dr. Nehemiah Massed: [phone]  - Dr. Laurence Ferrari: [phone]  - Dr. Nicole Kindred: [phone]  In the event of inclement weather, please call our main line at [phone] for an update on the status of any delays or closures.  Dermatology Medication Tips: Please keep the boxes that topical medications come in in order to help keep track of the instructions about where and how to use these. Pharmacies typically print the medication instructions only on the boxes and not directly on the medication tubes.   If your medication is too expensive, please contact our office at [phone] option 4 or send [place] a message through [place].   We are unable to tell what your co-pay for medications will be in advance as this is different depending on your insurance coverage. However, we may be able to find a substitute medication at lower cost or fill out paperwork to get insurance to cover a needed medication.   If a prior authorization is required to get your medication covered by your insurance company, please allow [place] 1-2  business days to complete this process.  Drug prices often vary depending on where the prescription is filled and some pharmacies may offer cheaper prices.  The website www.goodrx.com contains coupons for medications through different pharmacies. The prices here do not account for what the cost may be with help from insurance (it may be cheaper with your insurance), but the website can give you the price if you did not use any insurance.  - You can print the associated coupon and take it with your prescription to the pharmacy.  - You may also stop by our  office during regular business hours and pick up a GoodRx coupon card.  - If you need your prescription sent electronically to a different pharmacy, notify our office through [facility] or by phone at [phone] option 4.     Si Usted Necesita Algo Despus de Su Visita  Tambin puede enviarnos un mensaje a travs de Pharmacist, community. Por lo general respondemos a los mensajes de MyChart en el transcurso de 1 a 2 das hbiles.  Para renovar recetas, por favor pida a su farmacia que se ponga en contacto con nuestra oficina. Harland Dingwall de fax es [place] [phone].  Si tiene un asunto urgente cuando la clnica est cerrada y que no puede esperar hasta el siguiente da hbil, puede llamar/localizar a su doctor(a) al nmero que aparece a continuacin.   Por favor, tenga en cuenta que aunque hacemos todo lo posible para estar disponibles para asuntos urgentes fuera del horario de [place], no estamos disponibles las 24 horas del da, los 7 das de la [place].   Si tiene un problema urgente y no puede comunicarse con nosotros, puede optar por buscar atencin mdica  en el consultorio de su doctor(a), en una clnica privada, en un centro de atencin urgente o en una sala de emergencias.  Si tiene Engineering geologist, por favor llame inmediatamente al 911 o vaya a la sala de emergencias.  Nmeros de bper  - Dr. Nehemiah Massed: [phone]  - Dra. Moye: [phone]  - Dra. Nicole Kindred: [phone]  En caso de inclemencias del [place], por favor llame a Johnsie Kindred principal al [phone] para una actualizacin sobre el [place] de cualquier retraso o cierre.  Consejos para la medicacin en dermatologa: Por favor, guarde las cajas en las que vienen los medicamentos de uso tpico para ayudarle a seguir las instrucciones sobre dnde y cmo usarlos. Las farmacias generalmente imprimen las instrucciones del medicamento slo en las cajas y no directamente en los tubos del [place].   Si su  medicamento es muy caro, por favor, pngase en contacto con Zigmund Daniel llamando al [phone] y presione la opcin 4 o envenos un mensaje a travs de Pharmacist, community.   No podemos decirle cul ser su copago por los medicamentos por adelantado ya que esto es diferente dependiendo de la cobertura de su seguro. Sin embargo, es posible que podamos encontrar un medicamento sustituto a Electrical engineer un formulario para que el seguro cubra el medicamento que se considera necesario.   Si se requiere una autorizacin previa para que su compaa de seguros [place] su medicamento, por favor permtanos de 1 a 2 das hbiles para completar este proceso.  Los precios de los medicamentos varan con frecuencia dependiendo del Environmental consultant de dnde se surte la receta y alguna farmacias pueden ofrecer precios ms baratos.  El sitio web www.goodrx.com tiene cupones para medicamentos de Airline pilot. Los precios aqu no tienen en cuenta lo que podra costar con  la ayuda del seguro (puede ser ms barato con su seguro), pero el sitio web puede darle el precio si no Field seismologist.  - Puede imprimir el cupn correspondiente y llevarlo con su receta a la farmacia.  - Tambin puede pasar por nuestra oficina durante el horario de atencin regular y Charity fundraiser una tarjeta de cupones de GoodRx.  - Si necesita que su receta se enve electrnicamente a una farmacia diferente, informe a nuestra oficina a travs de MyChart de [place] o por telfono llamando al [phone] y presione la opcin 4.

## 2022-06-29 NOTE — Progress Notes (Signed)
   Follow-Up Visit   Subjective  Kathryn Oconnell is a 57 y.o. female who presents for the following:  patient here for rash follow up at elbows. Reports tacrolimus ointment is not helping. She also mentions lightened areas at bilateral arms she is concerned about.    The following portions of the chart were reviewed this encounter and updated as appropriate: medications, allergies, medical history  Review of Systems:  No other skin or systemic complaints except as noted in HPI or Assessment and Plan.  Objective  Well appearing patient in no apparent distress; mood and affect are within normal limits.   A focused examination was performed of the following areas: Arms, elbows, face   Relevant exam findings are noted in the Assessment and Plan.                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  Treatment: Recommend avoiding pressure or friction in the area. Can use OTC urea or salicylic acid creams to help thin/fade the area. Continue tacrolimus twice daily. 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 Condition is bothersome/symptomatic for patient. Currently flared.   Biopsy suggested LPP and clinically consistent   She has noticed minimal fading with the tacrolimus.   Start TMC 0.025% cream twice daily for 1 week to dark spots at face then switch back to tacrolimus twice daily.    Start Opzelura 1.5 cream - apply twice a day as needed Sample given lot [id number] Exp July 2024 31  Recommend daily sunscreen SPF 30+   IDIOPATHIC GUTTATE HYPOMELANOSIS Exam: hypipigmented macules at  arms  Treatment Plan: - Minimal suspicion for hypopigmented MF but will do skin exam at follow-up - Recommend daily broad spectrum sunscreen SPF 30+ to sun-exposed areas, reapply every 2 hours as needed. Call for new or changing lesions.  Staying in the shade or wearing long sleeves, sun glasses (UVA+UVB protection) and wide brim hats (4-inch brim around the entire circumference of the hat) are also recommended for sun protection.    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 Continue to monitor.   Return for 2 month follow up.  I, Ruthell Rummage, CMA, am acting as scribe for Forest Gleason, MD.   Documentation: I have reviewed the above documentation for accuracy and completeness, and I agree with the above.  Forest Gleason, MD

## 2022-07-23 ENCOUNTER — Other Ambulatory Visit: Payer: Self-pay | Admitting: Family Medicine

## 2022-07-23 ENCOUNTER — Other Ambulatory Visit: Payer: Self-pay | Admitting: Dermatology

## 2022-07-23 DIAGNOSIS — R112 Nausea with vomiting, unspecified: Secondary | ICD-10-CM

## 2022-07-23 DIAGNOSIS — L661 Lichen planopilaris: Secondary | ICD-10-CM

## 2022-08-08 ENCOUNTER — Other Ambulatory Visit (HOSPITAL_COMMUNITY): Payer: Self-pay | Admitting: Physician Assistant

## 2022-08-08 DIAGNOSIS — Z1231 Encounter for screening mammogram for malignant neoplasm of breast: Secondary | ICD-10-CM

## 2022-08-14 ENCOUNTER — Ambulatory Visit (HOSPITAL_COMMUNITY): Payer: Medicaid Other

## 2022-08-29 ENCOUNTER — Ambulatory Visit: Payer: Medicaid Other | Admitting: Adult Health

## 2022-08-31 ENCOUNTER — Ambulatory Visit: Payer: Medicaid Other | Admitting: Dermatology

## 2022-08-31 DIAGNOSIS — L859 Epidermal thickening, unspecified: Secondary | ICD-10-CM | POA: Diagnosis not present

## 2022-08-31 DIAGNOSIS — L816 Other disorders of diminished melanin formation: Secondary | ICD-10-CM | POA: Diagnosis not present

## 2022-08-31 DIAGNOSIS — L508 Other urticaria: Secondary | ICD-10-CM | POA: Diagnosis not present

## 2022-08-31 DIAGNOSIS — L438 Other lichen planus: Secondary | ICD-10-CM

## 2022-08-31 DIAGNOSIS — L661 Lichen planopilaris: Secondary | ICD-10-CM | POA: Diagnosis not present

## 2022-08-31 DIAGNOSIS — L905 Scar conditions and fibrosis of skin: Secondary | ICD-10-CM

## 2022-08-31 MED ORDER — AZELAIC ACID 15 % EX GEL
CUTANEOUS | 2 refills | Status: DC
Start: 2022-08-31 — End: 2023-07-25

## 2022-08-31 MED ORDER — TRETINOIN 0.05 % EX CREA: TOPICAL_CREAM | Freq: Every day | CUTANEOUS | 1 refills | Status: AC

## 2022-08-31 MED ORDER — HALOBETASOL PROPIONATE 0.05 % EX OINT: TOPICAL_OINTMENT | Freq: Two times a day (BID) | CUTANEOUS | 0 refills | Status: DC

## 2022-08-31 NOTE — Patient Instructions (Addendum)
FOR ITCHY SPOTS AT BODY OR BUG BITES Apply halobetasol 0.05% ointment twice a day as needed to affected areas for up to 2 weeks. Avoid applying to face, groin, and axilla. Use as directed. Long-term use can cause thinning of the skin.  Topical steroids (such as triamcinolone, fluocinolone, fluocinonide, mometasone, clobetasol, halobetasol, betamethasone, hydrocortisone) can cause thinning and lightening of the skin if they are used for too long in the same area. Your physician has selected the right strength medicine for your problem and area affected on the body. Please use your medication only as directed by your physician to prevent side effects.   FOR DARK SPOTS AT FACE Discontinue triamcinolone 0.025% cream to the face.  Will prescribe [facility] Hydroquinone 12%/kojic acid/vitamin C cream pea sized amount twice daily to the entire face for up to 3 months. This cannot be used more than 3 months due to risk of exogenous ochronosis (permanent dark spots). The patient was advised this is not covered by insurance since it is made by a compounding pharmacy. They will receive an email to check out and the medication will be mailed to their home.   Morning -  Apply [facility] hydroquinone, azelaic acid then tinted mineral sunscreen. Evening - Apply [facility] hydroquinone, azelaic acid followed with tacrolimus ointment.   After 3 months discontinue [facility] hydroquinone. Continue the azelaic acid and sunscreen in the morning. Continue azelaic acid and tacrolimus in the evening.   FOR ELBOWS Apply tretinoin cream nightly followed by tacrolimus ointment Apply glycolic acid sample in the morning    Instructions for [facility] Medications  One or more of your medications was sent to the [facility] mail order compounding pharmacy. You will receive an email from them and can purchase the medicine through that link. It will then be mailed to your home at the address  you confirmed. If for any reason you do not receive an email from them, please check your spam folder. If you still do not find the email, please let us know. [facility] phone number is [phone].  Your prescription was sent to [facility] in [place]. A representative from [employer] will contact you within 2 business hours to verify your address and insurance information to schedule a free delivery. If for any reason you do not receive a phone call from them, please reach out to them. Their phone number is [phone] and their hours are Monday-Friday 9:00 am-5:00 pm.     Due to recent changes in healthcare laws, you may see results of your pathology and/or laboratory studies on MyChart before the doctors have had a chance to review them. We understand that in some cases there may be results that are confusing or concerning to you. Please understand that not all results are received at the same time and often the doctors may need to interpret multiple results in order to provide you with the best plan of care or course of treatment. Therefore, we ask that you please give [place] 2 business days to thoroughly review all your results before contacting the office for clarification. Should we see a critical lab result, you will be contacted sooner.   If You Need Anything After Your Visit  If you have any questions or concerns for your doctor, please call our main line at [phone] and press option 4 to reach your doctor's medical assistant. If no one answers, please leave a voicemail as directed and we will return your call as soon as possible.  Messages left after 4 pm will be answered the following business day.   You may also send [place] a message via MyChart. We typically respond to MyChart messages within 1-2 business days.  For prescription refills, please ask your pharmacy to contact our office. Our fax number is [phone].  If you have an urgent issue when the clinic is closed  that cannot wait until the next business day, you can page your doctor at the number below.    Please note that while we do our best to be available for urgent issues outside of office hours, we are not available 24/7.   If you have an urgent issue and are unable to reach [place], you may choose to seek medical care at your doctor's office, retail clinic, urgent care center, or emergency room.  If you have a medical emergency, please immediately call 911 or go to the emergency department.  Pager Numbers  - Dr. Gwen Pounds: [phone]  - Dr. Neale Burly: [phone]  - Dr. Roseanne Reno: [phone]  In the event of inclement weather, please call our main line at [phone] for an update on the status of any delays or closures.  Dermatology Medication Tips: Please keep the boxes that topical medications come in in order to help keep track of the instructions about where and how to use these. Pharmacies typically print the medication instructions only on the boxes and not directly on the medication tubes.   If your medication is too expensive, please contact our office at [phone] option 4 or send [place] a message through MyChart.   We are unable to tell what your co-pay for medications will be in advance as this is different depending on your insurance coverage. However, we may be able to find a substitute medication at lower cost or fill out paperwork to get insurance to cover a needed medication.   If a prior authorization is required to get your medication covered by your insurance company, please allow [place] 1-2 business days to complete this process.  Drug prices often vary depending on where the prescription is filled and some pharmacies may offer cheaper prices.  The website www.goodrx.com contains coupons for medications through different pharmacies. The prices here do not account for what the cost may be with help from insurance (it may be cheaper with your insurance), but the website can give you  the price if you did not use any insurance.  - You can print the associated coupon and take it with your prescription to the pharmacy.  - You may also stop by our office during regular business hours and pick up a GoodRx coupon card.  - If you need your prescription sent electronically to a different pharmacy, notify our office through [facility] or by phone at [phone] option 4.

## 2022-08-31 NOTE — Progress Notes (Addendum)
Follow-Up Visit   Subjective  Kathryn Oconnell is a 57 y.o. female who presents for the following: patient currently using TMC 0.025% cream to face twice daily to face and using tacrolimus at face and elbows. Also used sample of Opzelura. Patient advises there has been no change or improvement.   Recently diagnosed with folliculitis at legs. Patient was given clindamcyin but it is not helping with itch. She did a bleach bath yesterday and said it did help with itch.    The following portions of the chart were reviewed this encounter and updated as appropriate: medications, allergies, medical history  Review of Systems:  No other skin or systemic complaints except as noted in HPI or Assessment and Plan.  Objective  Well appearing patient in no apparent distress; mood and affect are within normal limits.   A focused examination was performed of the following areas: Arms, elbows, face   Relevant exam findings are noted in the Assessment and Plan.   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  Treatment: Recommend avoiding pressure or friction in the area. Can use OTC urea or salicylic acid creams to help thin/fade the area. Start tretinoin 0.05% nightly to elbows. Do not put on face.  Recommend following tretinoin with tacrolimus ointment to minimize risk of irritation Topical retinoid medications like tretinoin/Retin-A, adapalene/Differin, tazarotene/Fabior, and Epiduo/Epiduo Forte can cause dryness and irritation when first started. Only apply a pea-sized amount to the entire affected area. Avoid applying it around the eyes, edges of mouth and creases at the nose. If you experience irritation, use a good moisturizer first and/or apply the  medicine less often. If you are doing well with the medicine, you can increase how often you use it until you are applying every night. Be careful with sun protection while using this medication as it can make you sensitive to the sun. This medicine should not be used by pregnant women.    LICHEN PLANUS PIGMENTOSUS Face  See photos, some worsening since last visit   Chronic and persistent condition with duration over one year. Condition is bothersome/symptomatic for patient. Currently flared.   Biopsy suggested LPP and clinically consistent   Discontinue triamcinolone to face.  Will prescribe Skin Medicinals Hydroquinone 12%/kojic acid/vitamin C cream pea sized amount twice daily to the entire face for up to 3 months. This cannot be used more than 3 months due to risk of exogenous ochronosis (permanent dark spots). The patient was advised this is not covered by insurance since it is made by a compounding pharmacy. They will receive an email to check out and the medication will be mailed to their home.   Restart tacrolimus ointment nightly to twice a day  Start azelaic acid cream twice a day  Morning -  Apply Skin Medicinals hydroquinone, azelaic acid then tinted mineral sunscreen. Evening - Apply Skin Medicinals hydroquinone, azelaic acid followed with tacrolimus ointment.   After 3 months discontinue Skin Medicinals hydroquinone. Continue the azelaic acid and sunscreen in the morning. Continue azelaic acid and tacrolimus in the evening.     Recommend daily tinted mineral sunscreen SPF 30+   IDIOPATHIC GUTTATE HYPOMELANOSIS Exam: hypopigmented macules at arms, no hypopigmented patches appreciated in sun protected areas  Treatment Plan: - Minimal suspicion for hypopigmented MF  given lack of involvement in sun protected skin- Recommend daily broad spectrum sunscreen SPF 30+ to sun-exposed areas, reapply every 2 hours as needed. Call for new or changing lesions.  Staying in the  shade or wearing long sleeves, sun glasses (UVA+UVB protection) and wide brim hats (4-inch brim around the entire circumference of the hat) are also recommended for sun protection.    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 Continue to monitor.    PAPULAR URTICARIA/BUG BITES Exam: edematous pink plaques  Treatment Plan: Apply halobetasol 0.05% ointment twice a day as needed to affected areas for up to 2 weeks. Avoid applying to face, groin, and axilla. Use as directed. Long-term use can cause thinning of the skin.  Topical steroids (such as triamcinolone, fluocinolone, fluocinonide, mometasone, clobetasol, halobetasol, betamethasone, hydrocortisone) can cause thinning and lightening of the skin if they are used for too long in the same area. Your physician has selected the right strength medicine for your problem and area affected on the body. Please use your medication only as directed by your physician to prevent side effects.    SCAR Exam: Dyspigmented smooth macules scattered at legs Benign-appearing.  Observation.  Call clinic for new or changing lesions. Recommend daily broad spectrum sunscreen SPF 30+, reapply every 2 hours as needed.  Treatment: Recommend Serica moisturizing scar formula cream every night or [facility] brand or Mederma silicone scar sheet every night for the first year after a scar appears to help with scar remodeling if desired. Scars remodel on their own for a full year and will gradually improve in appearance over time.  If she develops new tender bumps or pustules, recommend evaluation.    Return in about 3 months (around 12/01/2022).  Anise Salvo, RMA, am acting as scribe for Darden Dates, MD .    Documentation: I have reviewed the above documentation for accuracy and completeness, and I agree with the above.  Darden Dates, MD

## 2022-09-13 ENCOUNTER — Other Ambulatory Visit: Payer: Self-pay | Admitting: Family Medicine

## 2022-09-13 ENCOUNTER — Other Ambulatory Visit: Payer: Self-pay | Admitting: Dermatology

## 2022-09-13 ENCOUNTER — Other Ambulatory Visit (HOSPITAL_BASED_OUTPATIENT_CLINIC_OR_DEPARTMENT_OTHER): Payer: Self-pay | Admitting: Family Medicine

## 2022-09-13 DIAGNOSIS — L661 Lichen planopilaris: Secondary | ICD-10-CM

## 2022-09-13 DIAGNOSIS — R112 Nausea with vomiting, unspecified: Secondary | ICD-10-CM

## 2022-11-08 ENCOUNTER — Telehealth: Payer: Self-pay

## 2022-11-08 NOTE — Transitions of Care (Post Inpatient/ED Visit) (Unsigned)
11/08/2022  Name: Kathryn Oconnell MRN: [id number] DOB: 19-Sep-1965  Today's TOC FU Call Status: Today's TOC FU Call Status:: Successful TOC FU Call Completed TOC FU Call Complete Date: 11/08/22  Transition Care Management Follow-up Telephone Call Date of Discharge: 11/07/22 Discharge Facility: Other ([facility]) Name of Other ([facility]) Discharge Facility: [facility] Type of Discharge: Emergency Department Reason for ED Visit: Other: (COVID) How have you been since you were released from the hospital?: Better Any questions or concerns?: No  Items Reviewed: Did you receive and understand the discharge instructions provided?: Yes Medications obtained,verified, and reconciled?: Yes (Medications Reviewed) Any new allergies since your discharge?: No Dietary orders reviewed?: Yes Do you have support at home?: Yes People in Home: child(ren), adult  Medications Reviewed Today: Medications Reviewed Today     Reviewed by Karena Addison, LPN (Licensed Practical Nurse) on 11/08/22 at 1119  Med List Status: <None>   Medication Order Taking? Sig Documenting Provider Last Dose Status Informant  aspirin 81 MG tablet [id number] No Take 81 mg by mouth daily. [provider] Taking Active   atorvastatin (LIPITOR) 80 MG tablet [id number] No Take 1 tablet (80 mg total) by mouth daily. Gilmore Laroche, FNP Taking Active   Azelaic Acid 15 % gel [id number]  Apply to face twice daily. Moye, [place], MD  Active   brexpiprazole (REXULTI) 1 MG TABS tablet [id number] No Take 1 tablet (1 mg total) by mouth daily. Gardenia Phlegm, MD Taking Active   clobetasol (TEMOVATE) 0.05 % external solution [id number] No Apply 1 application topically 2 (two) times daily. To affected areas at the scalp. Avoid applying to face, groin, and axilla. Moye, [place], MD Taking Active   clobetasol ointment (TEMOVATE) 0.05 % [id number] No Apply twice daily to affected areas on elbows, avoid face, groin, axilla Willeen Niece, MD Taking Active   Continuous Blood Gluc Sensor (DEXCOM G6 SENSOR) MISC [id number] No 1 Device by Does not apply route as directed. de [place], Raymond J, MD Taking Active   Continuous Blood Gluc Transmit (DEXCOM G6 TRANSMITTER) MISC [id number] No 1 Device by Does not apply route as directed. [place], Buren Kos, MD Taking Active   cyclobenzaprine (FLEXERIL) 10 MG tablet [id number] No Take 1 tablet (10 mg total) by mouth 3 (three) times daily as needed. [place], Raymond J, MD Taking Active   fluconazole (DIFLUCAN) 150 MG tablet [id number]  TAKE 1 TABLET NOW AND 1 TABLET IN 3 DAYS Cyril Mourning A, NP  Active   glucose blood test strip [id number] No 1 each by Other route 4 (four) times daily. Use as instructed to check blood sugar four times daily. [provider] Taking Active   halobetasol (ULTRAVATE) 0.05 % ointment [id number]  Apply topically 2 (two) times daily. Moye, [place], MD  Active   insulin aspart (NOVOLOG) 100 UNIT/ML FlexPen [id number] No Inject 5-11 Units into the skin 3 (three) times daily before meals. Use up to 50U QD SQ per insulin pump de [place], Buren Kos, MD Taking Active   insulin glargine (LANTUS SOLOSTAR) 100 UNIT/ML Solostar Pen [id number] No Inject 15 Units into the skin daily. de [place], Raymond J, MD Taking Active   Insulin Pen Needle (BD PEN NEEDLE MICRO U/F) 32G X 6 MM MISC [id number] No USE AS DIRECTED [place], Buren Kos, MD Taking Active   ipratropium (ATROVENT) 0.06 % nasal spray [id number] No PLACE 2 SPRAYS INTO THE NOSE 3 (THREE) TIMES DAILY. de [place], Buren Kos, MD Taking Active   JARDIANCE  10 MG TABS tablet [id number] No Take 10 mg by mouth daily. [provider] Taking Active   levothyroxine (SYNTHROID) 100 MCG tablet [id number] No Take 1 tablet (100 mcg total) by mouth daily before breakfast. de [place], Raymond J, MD Taking Active   lidocaine (LIDODERM) 5 % [id number] No SMARTSIG:1-3 Patch(s) Topical [provider] Taking Active   LINZESS 145 MCG  CAPS capsule [id number] No Take 1 capsule (145 mcg total) by mouth daily. [place], Buren Kos, MD Taking Active   LORazepam (ATIVAN) 1 MG tablet [id number] No Take 1 tablet (1 mg total) by mouth 3 (three) times daily as needed for anxiety. Gilmore Laroche, FNP Taking Active   medroxyPROGESTERone (PROVERA) 2.5 MG tablet [id number] No TAKE 1 TABLET BY MOUTH ONCE DAILY. APPOINTMENT REQUIRED FOR FUTURE REFILLS Adline Potter, NP Taking Active   metoprolol tartrate (LOPRESSOR) 25 MG tablet [id number] No Take 1 tablet (25 mg total) by mouth 2 (two) times daily. [place], Raymond J, MD Taking Active   minoxidil (LONITEN) 2.5 MG tablet [id number]  TAKE 1 TABLET BY MOUTH EVERY DAY Willeen Niece, MD  Active   mupirocin ointment (BACTROBAN) 2 % [id number] No Apply topically daily. Moye, [place], MD Taking Active   Oxycodone HCl 10 MG TABS [id number] No Take 10 mg by mouth 4 (four) times daily as needed. [provider] Taking Active   promethazine (PHENERGAN) 25 MG tablet [id number]  TAKE 1 TABLET (25 MG TOTAL) BY MOUTH AS NEEDED FOR NAUSEA OR VOMITING. Gilmore Laroche, FNP  Active   Semaglutide, 1 MG/DOSE, 4 MG/3ML SOPN [id number] No Inject 1 mg into the skin once a week. de [place], Raymond J, MD Taking Active   Sertraline HCl 200 MG CAPS [id number] No Take 1 capsule by mouth every morning. [place], Raymond J, MD Taking Active   sodium bicarbonate 650 MG tablet [id number] No Take 650 mg by mouth 2 (two) times daily. [provider] Taking Active   tacrolimus (PROTOPIC) 0.1 % ointment [id number]  Apply topically 2 (two) times daily. Moye, [place], MD  Active   tretinoin (RETIN-A) 0.05 % cream [id number]  Apply topically at bedtime. To elbows. DO NOT USE AT [facility], [place], MD  Active   triamcinolone (KENALOG) 0.025 % cream [id number]  APPLY TOPICALLY TWICE DAILY FOR 1 WEEK TO DARK SPOTS AT FACE. Moye, [place], MD  Active   valACYclovir (VALTREX) 1000 MG tablet [id number] No Take 1 tablet (1,000 mg  total) by mouth daily as needed. de [place], Buren Kos, MD Taking Active   Vitamin D, Ergocalciferol, (DRISDOL) 1.25 MG (50000 UNIT) CAPS capsule [id number] No TAKE 1 CAPSULE (50,000 UNITS TOTAL) BY MOUTH EVERY 7 (SEVEN) DAYS Gilmore Laroche, FNP Taking Active   zolpidem (AMBIEN CR) 12.5 MG CR tablet [id number] No Take 1 tablet (12.5 mg total) by mouth at bedtime. Gilmore Laroche, FNP Taking Active             Home Care and Equipment/Supplies: Were Home Health Services Ordered?: NA Any new equipment or medical supplies ordered?: NA  Functional Questionnaire: Do you need assistance with bathing/showering or dressing?: No Do you need assistance with meal preparation?: No Do you need assistance with eating?: No Do you have difficulty maintaining continence: No Do you need assistance with getting out of bed/getting out of a chair/moving?: No Do you have difficulty managing or taking your medications?: No  Follow up appointments reviewed: PCP Follow-up appointment confirmed?: No (no avail appts, sent message to staff to schedule) MD Provider  Line [phone] Given: No Specialist Hospital Follow-up appointment confirmed?: NA Do you need transportation to your follow-up appointment?: No Do you understand care options if your condition(s) worsen?: Yes-patient verbalized understanding    SIGNATURE Karena Addison, LPN [facility] Nurse Health Advisor Direct Dial [phone]

## 2022-11-16 ENCOUNTER — Telehealth: Payer: Self-pay

## 2022-11-16 NOTE — Transitions of Care (Post Inpatient/ED Visit) (Signed)
   11/16/2022  Name: LAELIA OTTOSON MRN: [id number] DOB: [birth year]  Today's TOC FU Call Status: Today's TOC FU Call Status:: Unsuccessful Call (1st Attempt) Unsuccessful Call (1st Attempt) Date: 11/16/22  Attempted to reach the patient regarding the most recent Inpatient/ED visit.  Follow Up Plan: Additional outreach attempts will be made to reach the patient to complete the Transitions of Care (Post Inpatient/ED visit) call.   Signature   Woodfin Ganja LPN [facility] Nurse Health Advisor Direct Dial [phone]

## 2022-11-21 NOTE — Transitions of Care (Post Inpatient/ED Visit) (Signed)
   11/21/2022  Name: AZELYN WINKER MRN: [id number] DOB: February 25, 1966  Today's TOC FU Call Status: Today's TOC FU Call Status:: Unsuccessful Call (2nd Attempt) Unsuccessful Call (1st Attempt) Date: 11/16/22 Unsuccessful Call (2nd Attempt) Date: 11/21/22  Attempted to reach the patient regarding the most recent Inpatient/ED visit.  Follow Up Plan: No further outreach attempts will be made at this time. We have been unable to contact the patient. Pt has fu appt 11-22-22  Signature   Woodfin Ganja LPN [facility] Nurse Health Advisor Direct Dial [phone]

## 2022-11-22 ENCOUNTER — Telehealth: Payer: Self-pay

## 2022-11-22 ENCOUNTER — Inpatient Hospital Stay: Payer: Medicare HMO | Admitting: Family Medicine

## 2022-11-22 NOTE — Transitions of Care (Post Inpatient/ED Visit) (Unsigned)
   11/22/2022  Name: Kathryn Oconnell MRN: [id number] DOB: Sep 07, 1965  Today's TOC FU Call Status: Unsuccessful Call (1st Attempt) Date: 11/22/22  Attempted to reach the patient regarding the most recent Inpatient/ED visit.  Follow Up Plan: Additional outreach attempts will be made to reach the patient to complete the Transitions of Care (Post Inpatient/ED visit) call.   Signature Karena Addison, LPN [facility] Nurse Health Advisor Direct Dial [phone]

## 2022-11-23 ENCOUNTER — Ambulatory Visit: Payer: Medicaid Other | Admitting: Dermatology

## 2022-11-23 NOTE — Transitions of Care (Post Inpatient/ED Visit) (Signed)
   11/23/2022  Name: Kathryn Oconnell MRN: [id number] DOB: 13-Feb-1966  Today's TOC FU Call Status: Unsuccessful Call (1st Attempt) Date: 11/22/22  Attempted to reach the patient regarding the most recent Inpatient/ED visit. Patient has already been seen Follow Up Plan: No further outreach attempts will be made at this time. We have been unable to contact the patient.  Signature Karena Addison, LPN [facility] Nurse Health Advisor Direct Dial [phone]

## 2022-11-24 ENCOUNTER — Telehealth: Payer: Self-pay

## 2022-11-24 NOTE — Transitions of Care (Post Inpatient/ED Visit) (Signed)
   11/24/2022  Name: Kathryn Oconnell MRN: [id number] DOB: 1965-09-01  Today's TOC FU Call Status: Today's TOC FU Call Status:: Unsuccessful Call (1st Attempt) Unsuccessful Call (1st Attempt) Date: 11/24/22  Attempted to reach the patient regarding the most recent Inpatient/ED visit.  Follow Up Plan: Additional outreach attempts will be made to reach the patient to complete the Transitions of Care (Post Inpatient/ED visit) call.   [name] J. Cristela Felt, RN, BSN, MSN Care Management Coordinator/[place] Phone Number:  [phone]

## 2022-11-27 ENCOUNTER — Telehealth: Payer: Self-pay

## 2022-11-27 NOTE — Transitions of Care (Post Inpatient/ED Visit) (Signed)
   11/27/2022  Name: Kathryn Oconnell MRN: [id number] DOB: 1966/03/04  Today's TOC FU Call Status: Today's TOC FU Call Status:: Unsuccessful Call (2nd Attempt) Unsuccessful Call (1st Attempt) Date: 11/24/22 Unsuccessful Call (2nd Attempt) Date: 11/27/22  Attempted to reach the patient regarding the most recent Inpatient/ED visit.  Follow Up Plan: Additional outreach attempts will be made to reach the patient to complete the Transitions of Care (Post Inpatient/ED visit) call.   Jakerra Floyd J. Keana Dueitt, RN, BSN, MSN Care Management Coordinator/[place] Phone Number:  [phone]

## 2022-11-28 ENCOUNTER — Telehealth: Payer: Self-pay

## 2022-11-28 NOTE — Transitions of Care (Post Inpatient/ED Visit) (Signed)
   11/28/2022  Name: AVIEL VITTONE MRN: [id number] DOB: 02-Nov-1965  Today's TOC FU Call Status: Today's TOC FU Call Status:: Unsuccessful Call (3rd Attempt) Unsuccessful Call (1st Attempt) Date: 11/24/22 Unsuccessful Call (2nd Attempt) Date: 11/27/22 Unsuccessful Call (3rd Attempt) Date: 11/28/22  Attempted to reach the patient regarding the most recent Inpatient/ED visit.  Follow Up Plan: No further outreach attempts will be made at this time. We have been unable to contact the patient.  Derk Doubek J. Cristela Felt, RN, BSN, MSN Care Management Coordinator/[place] Phone Number:  [phone]

## 2022-12-07 ENCOUNTER — Other Ambulatory Visit: Payer: Self-pay | Admitting: Family Medicine

## 2022-12-07 ENCOUNTER — Other Ambulatory Visit: Payer: Self-pay | Admitting: Adult Health

## 2022-12-07 DIAGNOSIS — E782 Mixed hyperlipidemia: Secondary | ICD-10-CM

## 2023-01-15 ENCOUNTER — Inpatient Hospital Stay (HOSPITAL_COMMUNITY): Admission: RE | Admit: 2023-01-15 | Payer: Medicare HMO | Source: Ambulatory Visit

## 2023-02-05 ENCOUNTER — Encounter: Payer: Self-pay | Admitting: Dermatology

## 2023-02-05 ENCOUNTER — Ambulatory Visit (INDEPENDENT_AMBULATORY_CARE_PROVIDER_SITE_OTHER): Payer: Medicare HMO | Admitting: Dermatology

## 2023-02-05 DIAGNOSIS — L719 Rosacea, unspecified: Secondary | ICD-10-CM

## 2023-02-05 DIAGNOSIS — L661 Lichen planopilaris, unspecified: Secondary | ICD-10-CM | POA: Diagnosis not present

## 2023-02-05 DIAGNOSIS — L810 Postinflammatory hyperpigmentation: Secondary | ICD-10-CM

## 2023-02-05 DIAGNOSIS — L438 Other lichen planus: Secondary | ICD-10-CM

## 2023-02-05 DIAGNOSIS — Z7189 Other specified counseling: Secondary | ICD-10-CM

## 2023-02-05 MED ORDER — MINOXIDIL 2.5 MG PO TABS: 2.5000 mg | ORAL_TABLET | Freq: Every day | ORAL | 1 refills | Status: DC

## 2023-02-05 MED ORDER — DOXYCYCLINE MONOHYDRATE 100 MG PO CAPS: 100.0000 mg | ORAL_CAPSULE | Freq: Two times a day (BID) | ORAL | 2 refills | Status: DC

## 2023-02-05 MED ORDER — METRONIDAZOLE 0.75 % EX CREA
TOPICAL_CREAM | Freq: Two times a day (BID) | CUTANEOUS | 2 refills | Status: DC
Start: 2023-02-05 — End: 2023-11-09

## 2023-02-05 NOTE — Patient Instructions (Addendum)
Treatment Plan Start doxycycline 100 mg twice daily with food Start metronidazole 0.75% cream 2 times daily to face.   Doxycycline should be taken with food to prevent nausea. Do not lay down for 30 minutes after taking. Be cautious with sun exposure and use good sun protection while on this medication. Pregnant women should not take this medication.   Continue minoxidil 2.5 mg daily.   Doses of minoxidil for hair loss are considered 'low dose'. This is because the doses used for hair loss are much lower than the doses which are used for conditions such as high blood pressure (hypertension). The doses used for hypertension are 10-40mg  per day.  Side effects are uncommon at the low doses (up to 2.5 mg/day) used to treat hair loss. Potential side effects, more commonly seen at higher doses, include: Increase in hair growth (hypertrichosis) elsewhere on face and body Temporary hair shedding upon starting medication which may last up to 4 weeks Ankle swelling, fluid retention, rapid weight gain more than 5 pounds Low blood pressure and feeling lightheaded or dizzy when standing up quickly Fast or irregular heartbeat Headaches   Due to recent changes in healthcare laws, you may see results of your pathology and/or laboratory studies on MyChart before the doctors have had a chance to review them. We understand that in some cases there may be results that are confusing or concerning to you. Please understand that not all results are received at the same time and often the doctors may need to interpret multiple results in order to provide you with the best plan of care or course of treatment. Therefore, we ask that you please give [place] 2 business days to thoroughly review all your results before contacting the office for clarification. Should we see a critical lab result, you will be contacted sooner.   If You Need Anything After Your Visit  If you have any questions or concerns for your doctor, please  call our main line at [phone] and press option 4 to reach your doctor's medical assistant. If no one answers, please leave a voicemail as directed and we will return your call as soon as possible. Messages left after 4 pm will be answered the following business day.   You may also send [place] a message via MyChart. We typically respond to MyChart messages within 1-2 business days.  For prescription refills, please ask your pharmacy to contact our office. Our fax number is [phone].  If you have an urgent issue when the clinic is closed that cannot wait until the next business day, you can page your doctor at the number below.    Please note that while we do our best to be available for urgent issues outside of office hours, we are not available 24/7.   If you have an urgent issue and are unable to reach [place], you may choose to seek medical care at your doctor's office, retail clinic, urgent care center, or emergency room.  If you have a medical emergency, please immediately call 911 or go to the emergency department.  Pager Numbers  - Dr. Gwen Pounds: [phone]  - Dr. Roseanne Reno: [phone]  - Dr. Katrinka Blazing: [phone]   In the event of inclement weather, please call our main line at [phone] for an update on the status of any delays or closures.  Dermatology Medication Tips: Please keep the boxes that topical medications come in in order to help keep track of the instructions about where and how to use these. Pharmacies  typically print the medication instructions only on the boxes and not directly on the medication tubes.   If your medication is too expensive, please contact our office at [phone] option 4 or send [place] a message through MyChart.   We are unable to tell what your co-pay for medications will be in advance as this is different depending on your insurance coverage. However, we may be able to find a substitute medication at lower cost or fill out paperwork to get  insurance to cover a needed medication.   If a prior authorization is required to get your medication covered by your insurance company, please allow [place] 1-2 business days to complete this process.  Drug prices often vary depending on where the prescription is filled and some pharmacies may offer cheaper prices.  The website www.goodrx.com contains coupons for medications through different pharmacies. The prices here do not account for what the cost may be with help from insurance (it may be cheaper with your insurance), but the website can give you the price if you did not use any insurance.  - You can print the associated coupon and take it with your prescription to the pharmacy.  - You may also stop by our office during regular business hours and pick up a GoodRx coupon card.  - If you need your prescription sent electronically to a different pharmacy, notify our office through [facility] or by phone at [phone] option 4.

## 2023-02-05 NOTE — Progress Notes (Signed)
   Follow-Up Visit   Subjective  Kathryn Oconnell is a 57 y.o. female who presents for the following: lichen planus pigmentosus follow up. Currently patient is using CeraVe moisturizer, azelaic acid and over the counter Musely. Patient started the [facility] 2-3 weeks ago. Patient advises she did have some improvement with Skin Medicinals hydroquinone and has had more since starting Musely.   The patient has spots, moles and lesions to be evaluated, some may be new or changing and the patient may have concern these could be cancer.   The following portions of the chart were reviewed this encounter and updated as appropriate: medications, allergies, medical history  Review of Systems:  No other skin or systemic complaints except as noted in HPI or Assessment and Plan.  Objective  Well appearing patient in no apparent distress; mood and affect are within normal limits.   A focused examination was performed of the following areas: face  Relevant exam findings are noted in the Assessment and Plan.  Scalp           Assessment & Plan   ROSACEA Exam Mid face erythema with telangiectasias +/- scattered inflammatory papules  Chronic flared not at patient goal  Rosacea is a chronic progressive skin condition usually affecting the face of adults, causing redness and/or acne bumps. It is treatable but not curable. It sometimes affects the eyes (ocular rosacea) as well. It may respond to topical and/or systemic medication and can flare with stress, sun exposure, alcohol, exercise, topical steroids (including hydrocortisone/cortisone 10) and some foods.  Daily application of broad spectrum spf 30+ sunscreen to face is recommended to reduce flares.  Treatment Plan Start doxycycline 100 mg twice daily with food Start metronidazole 0.75% cream 2 times daily to face. Patient prefers over compounded triple cream  Doxycycline should be taken with food to prevent nausea. Do not lay down for 30  minutes after taking. Be cautious with sun exposure and use good sun protection while on this medication. Pregnant women should not take this medication.   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 Can then consider skinmedicinals hydroquinone again  Return in about 3 months (around 05/08/2023).  Anise Salvo, RMA, am acting as scribe for Elie Goody, MD .   Documentation: I have reviewed the above documentation for accuracy and completeness, and I agree with the above.  Elie Goody, MD

## 2023-02-17 ENCOUNTER — Other Ambulatory Visit: Payer: Self-pay | Admitting: Family Medicine

## 2023-02-17 DIAGNOSIS — E559 Vitamin D deficiency, unspecified: Secondary | ICD-10-CM

## 2023-02-26 ENCOUNTER — Ambulatory Visit: Payer: Medicare HMO | Admitting: Adult Health

## 2023-02-26 ENCOUNTER — Other Ambulatory Visit (HOSPITAL_COMMUNITY)
Admission: RE | Admit: 2023-02-26 | Discharge: 2023-02-26 | Disposition: A | Discharge status: Home / Self Care | Payer: Medicare HMO | Source: Ambulatory Visit | Attending: Adult Health | Admitting: Adult Health

## 2023-02-26 ENCOUNTER — Encounter: Payer: Self-pay | Admitting: Adult Health

## 2023-02-26 VITALS — BP 126/72 | HR 89 | Ht 64.0 in | Wt 131.0 lb

## 2023-02-26 DIAGNOSIS — Z1331 Encounter for screening for depression: Secondary | ICD-10-CM

## 2023-02-26 DIAGNOSIS — R195 Other fecal abnormalities: Secondary | ICD-10-CM | POA: Diagnosis not present

## 2023-02-26 DIAGNOSIS — Z1151 Encounter for screening for human papillomavirus (HPV): Secondary | ICD-10-CM | POA: Diagnosis not present

## 2023-02-26 DIAGNOSIS — Z01419 Encounter for gynecological examination (general) (routine) without abnormal findings: Secondary | ICD-10-CM

## 2023-02-26 DIAGNOSIS — Z0000 Encounter for general adult medical examination without abnormal findings: Secondary | ICD-10-CM | POA: Diagnosis not present

## 2023-02-26 DIAGNOSIS — Z1211 Encounter for screening for malignant neoplasm of colon: Secondary | ICD-10-CM

## 2023-02-26 LAB — HEMOCCULT GUIAC POC 1CARD (OFFICE): Fecal Occult Blood, POC: POSITIVE — AB

## 2023-02-26 NOTE — Progress Notes (Signed)
Patient ID: Kathryn Oconnell, female   DOB: June 24, 1965, 57 y.o.   MRN: [id number] History of Present Illness: Kathryn Oconnell is a 57 year old black female, widowed, PM in for a well woman gyn exam and pap. She says she has had LUQ pain and seen GI at [facility], but still wakes most days with pain. She said was told constipation related, but she says she is having BM.  New PCP is Gilmore Laroche NP    Current Medications, Allergies, Past Medical History, Past Surgical History, Family History and Social History were reviewed in [employer] record.     Review of Systems: Patient denies any headaches, hearing loss, fatigue, blurred vision, shortness of breath, chest pain, problems with bowel movements, urination, or intercourse.(Not active) No joint pain or mood swings.  See HPI for positives.   Physical Exam:BP 126/72 (BP Location: Left Arm, Patient Position: Sitting, Cuff Size: Normal)   Pulse 89   Ht 5\' 4"  (1.626 m)   Wt 131 lb (59.4 kg)   LMP 06/18/2013   BMI 22.49 kg/m   General:  Well developed, well nourished, no acute distress Skin:  Warm and dry Neck:  Midline trachea, normal thyroid, good ROM, no lymphadenopathy Lungs; Clear to auscultation bilaterally Breast:  No dominant palpable mass, retraction, or nipple discharge Cardiovascular: Regular rate and rhythm Abdomen:  Soft, non tender, no hepatosplenomegaly Pelvic:  External genitalia is normal in appearance, no lesions.  The vagina is pale. Urethra has no lesions or masses. The cervix is smooth, pap with HR HPV genotyping performed.   Uterus is felt to be normal size, shape, and contour.  No adnexal masses or tenderness noted.Bladder is non tender, no masses felt. Rectal: Good sphincter tone, no polyps, or hemorrhoids felt.  Hemoccult positive Extremities/musculoskeletal:  No swelling or varicosities noted, no clubbing or cyanosis Psych:  No mood changes, alert and cooperative,seems happy AA is 1 Fall risk is  low    02/26/2023    2:58 PM 01/24/2022    3:01 PM 11/03/2021    2:08 PM  Depression screen PHQ 2/9  Decreased Interest 1 0 3  Down, Depressed, Hopeless 2 0 3  PHQ - 2 Score 3 0 6  Altered sleeping 3  3  Tired, decreased energy 2  3  Change in appetite 2  3  Feeling bad or failure about yourself  0  3  Trouble concentrating 0  3  Moving slowly or fidgety/restless 0  0  Suicidal thoughts 0  0  PHQ-9 Score 10  21  Difficult doing work/chores   Extremely dIfficult   On ativan    02/26/2023    2:59 PM 11/03/2021    2:11 PM 05/04/2020    2:51 PM  GAD 7 : Generalized Anxiety Score  Nervous, Anxious, on Edge 3 3 3   Control/stop worrying 1 3 3   Worry too much - different things 1 3 3   Trouble relaxing 2 3 3   Restless 0 3 1  Easily annoyed or irritable 1 3 1   Afraid - awful might happen 0 0 1  Total GAD 7 Score 8 18 15   Anxiety Difficulty  Extremely difficult       Upstream - 02/26/23 1442       Pregnancy Intention Screening   Does the patient want to become pregnant in the next year? N/A    Does the patient's partner want to become pregnant in the next year? N/A    Would the patient  like to discuss contraceptive options today? N/A      Contraception Wrap Up   Current Method No Method - Other Reason    Reason for No Current Contraceptive Method at Intake ([facility] Only) Other   postmenopausal   End Method No Method - Other Reason    Contraception Counseling Provided No             Examination chaperoned by Maury Dus RN  Impression and Plan: 1. Encounter for gynecological examination with Papanicolaou smear of cervix Pap sent Pap in 3 years if negative Physical in 1 year Colonoscopy per GI She will scheduled mammogram Has had labs She is going to wean off provera, was using for vaginal burning  - Cytology - PAP  2. Encounter for screening fecal occult blood testing +hemoccult card  - POCT occult blood stool  3. Fecal occult blood test positive 3 hemoccult  cards sent home to do and bring back  - POCT occult blood stool

## 2023-02-27 ENCOUNTER — Other Ambulatory Visit: Payer: Self-pay | Admitting: Family Medicine

## 2023-02-27 DIAGNOSIS — R112 Nausea with vomiting, unspecified: Secondary | ICD-10-CM

## 2023-02-27 MED ORDER — PROMETHAZINE HCL 25 MG PO TABS: 25.0000 mg | ORAL_TABLET | ORAL | 0 refills | Status: DC | PRN

## 2023-03-02 LAB — CYTOLOGY - PAP
Adequacy: ABSENT
Comment: NEGATIVE
Diagnosis: NEGATIVE
High risk HPV: NEGATIVE

## 2023-03-26 ENCOUNTER — Telehealth: Payer: Self-pay

## 2023-03-26 NOTE — Telephone Encounter (Signed)
Fax received requesting refills of TMC 0.025% cream apply to aa's dark spots on face BID for 1 week. Please advise. [facility] [address] [place], [place] [address].

## 2023-04-09 ENCOUNTER — Other Ambulatory Visit (HOSPITAL_BASED_OUTPATIENT_CLINIC_OR_DEPARTMENT_OTHER): Payer: Self-pay | Admitting: Family Medicine

## 2023-04-09 ENCOUNTER — Other Ambulatory Visit: Payer: Self-pay | Admitting: Family Medicine

## 2023-04-09 DIAGNOSIS — R112 Nausea with vomiting, unspecified: Secondary | ICD-10-CM

## 2023-04-09 MED ORDER — PROMETHAZINE HCL 25 MG PO TABS: 25.0000 mg | ORAL_TABLET | ORAL | 0 refills | Status: DC | PRN

## 2023-05-07 ENCOUNTER — Ambulatory Visit (HOSPITAL_COMMUNITY)
Admission: RE | Admit: 2023-05-07 | Discharge: 2023-05-07 | Disposition: A | Discharge status: Home / Self Care | Payer: Medicare Other | Source: Ambulatory Visit | Attending: Physician Assistant | Admitting: Physician Assistant

## 2023-05-07 DIAGNOSIS — Z1231 Encounter for screening mammogram for malignant neoplasm of breast: Secondary | ICD-10-CM | POA: Diagnosis present

## 2023-05-08 ENCOUNTER — Ambulatory Visit: Payer: Medicare HMO | Admitting: Dermatology

## 2023-06-25 ENCOUNTER — Ambulatory Visit: Admitting: Dermatology

## 2023-06-25 ENCOUNTER — Encounter: Payer: Self-pay | Admitting: Dermatology

## 2023-06-25 ENCOUNTER — Ambulatory Visit: Payer: Medicare Other | Admitting: Dermatology

## 2023-06-25 DIAGNOSIS — L249 Irritant contact dermatitis, unspecified cause: Secondary | ICD-10-CM | POA: Diagnosis not present

## 2023-06-25 DIAGNOSIS — L719 Rosacea, unspecified: Secondary | ICD-10-CM

## 2023-06-25 DIAGNOSIS — T378X5A Adverse effect of other specified systemic anti-infectives and antiparasitics, initial encounter: Secondary | ICD-10-CM | POA: Diagnosis not present

## 2023-06-25 DIAGNOSIS — L853 Xerosis cutis: Secondary | ICD-10-CM

## 2023-06-25 DIAGNOSIS — L661 Lichen planopilaris, unspecified: Secondary | ICD-10-CM | POA: Diagnosis not present

## 2023-06-25 DIAGNOSIS — L988 Other specified disorders of the skin and subcutaneous tissue: Secondary | ICD-10-CM

## 2023-06-25 DIAGNOSIS — L438 Other lichen planus: Secondary | ICD-10-CM

## 2023-06-25 MED ORDER — OPZELURA 1.5 % EX CREA: TOPICAL_CREAM | CUTANEOUS | 2 refills | Status: DC

## 2023-06-25 NOTE — Progress Notes (Signed)
   Follow-Up Visit   Subjective  Kathryn Oconnell is a 58 y.o. female who presents for the following: lichen planus pigmentosus follow up. Face and scalp Currently patient is using CeraVe moisturizer, azelaic acid and over the counter Musely.  Patient advises she did have some improvement with Skin Medicinals hydroquinone and has had more since starting Musely.   Taking Minoxidil 2.5 mg daily for alopecia/LPP.  Not taking Doxycycline 100 mg twice a day anymore. Caused stomach cramping. Not using Metronidazole 0.75% cream twice a day to face. States it caused skin to feel like it was burning. Eyelids are red.   Would like more samples of topicals to use on her elbows.   The patient has spots, moles and lesions to be evaluated, some may be new or changing and the patient may have concern these could be cancer.   The following portions of the chart were reviewed this encounter and updated as appropriate: medications, allergies, medical history  Review of Systems:  No other skin or systemic complaints except as noted in HPI or Assessment and Plan.  Objective  Well appearing patient in no apparent distress; mood and affect are within normal limits.   A focused examination was performed of the following areas: face  Relevant exam findings are noted in the Assessment and Plan.     Assessment & Plan   ROSACEA + irritant contact dermatitis from metronidazole cream Exam: Mid face erythema with telangiectasias + scattered inflammatory papules  Chronic flared not at patient goal  Rosacea is a chronic progressive skin condition usually affecting the face of adults, causing redness and/or acne bumps. It is treatable but not curable. It sometimes affects the eyes (ocular rosacea) as well. It may respond to topical and/or systemic medication and can flare with stress, sun exposure, alcohol, exercise, topical steroids (including hydrocortisone/cortisone 10) and some foods.  Daily application of  broad spectrum spf 30+ sunscreen to face is recommended to reduce flares.  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   Treatment: Recommend avoiding pressure or friction in the area. Can use OTC urea or salicylic acid creams or glycolic acid to help thin/fade the area.  Use Opzelura 1.5 % cream  sample given x1 to patient to use twice daily Lot: [id number] Exp: 05/31/2024.  Xerosis - diffuse xerotic patches at hands - recommend gentle, hydrating skin care - gentle skin care handout given    Return in about 4 weeks (around 07/23/2023) for Rash Follow Up, With Dr. Roseanne Reno.  I, Lawson Radar, CMA, am acting as scribe for Elie Goody, MD.    Documentation: I have reviewed the above documentation for accuracy and completeness, and I agree with the above.  Elie Goody, MD

## 2023-06-25 NOTE — Patient Instructions (Addendum)
 Elbows: Recommend avoiding pressure or friction in the area. Can use OTC urea or salicylic acid creams or glycolic acid to help thin/fade the area.  Use Opzelura 1.5 % cream  sample given x1 to patient to use twice daily to face and elbows   Scalp/Face Continue minoxidil 2.5 mg daily, tolerating with no side effects, has some hair growth Start Opzelura 1.5 cream - apply twice a day as needed    Opzelura was sent to [facility] Your prescription was sent to [facility] in [place]. A representative from [facility] will contact you within 3 business hours to verify your address and insurance information to schedule a free delivery. If for any reason you do not receive a phone call from them, please reach out to them. Their phone number is [phone] and their hours are Monday-Friday 9:00 am-5:00 pm.      Recommend daily broad spectrum sunscreen SPF 30+ to sun-exposed areas, reapply every 2 hours as needed. Call for new or changing lesions.  Staying in the shade or wearing long sleeves, sun glasses (UVA+UVB protection) and wide brim hats (4-inch brim around the entire circumference of the hat) are also recommended for sun protection.     Due to recent changes in healthcare laws, you may see results of your pathology and/or laboratory studies on MyChart before the doctors have had a chance to review them. We understand that in some cases there may be results that are confusing or concerning to you. Please understand that not all results are received at the same time and often the doctors may need to interpret multiple results in order to provide you with the best plan of care or course of treatment. Therefore, we ask that you please give [place] 2 business days to thoroughly review all your results before contacting the office for clarification. Should we see a critical lab result, you will be contacted sooner.   If You Need Anything After Your Visit  If you have any questions  or concerns for your doctor, please call our main line at [phone] and press option 4 to reach your doctor's medical assistant. If no one answers, please leave a voicemail as directed and we will return your call as soon as possible. Messages left after 4 pm will be answered the following business day.   You may also send [place] a message via MyChart. We typically respond to MyChart messages within 1-2 business days.  For prescription refills, please ask your pharmacy to contact our office. Our fax number is [phone].  If you have an urgent issue when the clinic is closed that cannot wait until the next business day, you can page your doctor at the number below.    Please note that while we do our best to be available for urgent issues outside of office hours, we are not available 24/7.   If you have an urgent issue and are unable to reach [place], you may choose to seek medical care at your doctor's office, retail clinic, urgent care center, or emergency room.  If you have a medical emergency, please immediately call 911 or go to the emergency department.  Pager Numbers  - Dr. Gwen Pounds: [phone]  - Dr. Roseanne Reno: [phone]  - Dr. Katrinka Blazing: [phone]   In the event of inclement weather, please call our main line at [phone] for an update on the status of any delays or closures.  Dermatology Medication Tips: Please keep the boxes that topical medications come in in order to  help keep track of the instructions about where and how to use these. Pharmacies typically print the medication instructions only on the boxes and not directly on the medication tubes.   If your medication is too expensive, please contact our office at [phone] option 4 or send [place] a message through MyChart.   We are unable to tell what your co-pay for medications will be in advance as this is different depending on your insurance coverage. However, we may be able to find a substitute medication at lower  cost or fill out paperwork to get insurance to cover a needed medication.   If a prior authorization is required to get your medication covered by your insurance company, please allow [place] 1-2 business days to complete this process.  Drug prices often vary depending on where the prescription is filled and some pharmacies may offer cheaper prices.  The website www.goodrx.com contains coupons for medications through different pharmacies. The prices here do not account for what the cost may be with help from insurance (it may be cheaper with your insurance), but the website can give you the price if you did not use any insurance.  - You can print the associated coupon and take it with your prescription to the pharmacy.  - You may also stop by our office during regular business hours and pick up a GoodRx coupon card.  - If you need your prescription sent electronically to a different pharmacy, notify our office through [facility] or by phone at [phone] option 4.     Si Usted Necesita Algo Despus de Su Visita  Tambin puede enviarnos un mensaje a travs de Clinical cytogeneticist. Por lo general respondemos a los mensajes de MyChart en el transcurso de 1 a 2 das hbiles.  Para renovar recetas, por favor pida a su farmacia que se ponga en contacto con nuestra oficina. Annie Sable de fax es [place] [phone].  Si tiene un asunto urgente cuando la clnica est cerrada y que no puede esperar hasta el siguiente da hbil, puede llamar/localizar a su doctor(a) al nmero que aparece a continuacin.   Por favor, tenga en cuenta que aunque hacemos todo lo posible para estar disponibles para asuntos urgentes fuera del horario de [place], no estamos disponibles las 24 horas del da, los 7 [address]  [address] de la [place].   Si tiene un problema urgente y no puede comunicarse con nosotros, puede optar por buscar atencin mdica  en el consultorio de su doctor(a), en una clnica privada, en un centro de atencin urgente o en  una sala de emergencias.  Si tiene Engineer, drilling, por favor llame inmediatamente al 911 o vaya a la sala de emergencias.  Nmeros de bper  - Dr. Gwen Pounds: [phone]  - Dra. Roseanne Reno: [phone]  - Dr. Katrinka Blazing: [phone]   En caso de inclemencias del tiempo, por favor llame a Lacy Duverney principal al [phone] para una actualizacin sobre el [place] de cualquier retraso o cierre.  Consejos para la medicacin en dermatologa: Por favor, guarde las cajas en las que vienen los medicamentos de uso tpico para ayudarle a seguir las instrucciones sobre dnde y cmo usarlos. Las farmacias generalmente imprimen las instrucciones del medicamento slo en las cajas y no directamente en los tubos del [place].   Si su medicamento es muy caro, por favor, pngase en contacto con Rolm Gala llamando al [phone] y presione la opcin 4 o envenos un mensaje a travs de Clinical cytogeneticist.   No podemos decirle cul ser su copago  por los medicamentos por adelantado ya que esto es diferente dependiendo de la cobertura de su seguro. Sin embargo, es posible que podamos encontrar un medicamento sustituto a Audiological scientist un formulario para que el seguro cubra el medicamento que se considera necesario.   Si se requiere una autorizacin previa para que su compaa de seguros [place] su medicamento, por favor permtanos de 1 a 2 das hbiles para completar [address]  Los precios de los medicamentos varan con frecuencia dependiendo del Environmental consultant de dnde se surte la receta y alguna farmacias pueden ofrecer precios ms baratos.  El sitio web www.goodrx.com tiene cupones para medicamentos de Health and safety inspector. Los precios aqu no tienen en cuenta lo que podra costar con la ayuda del seguro (puede ser ms barato con su seguro), pero el sitio web puede darle el precio si no utiliz Tourist information centre manager.  - Puede imprimir el cupn correspondiente y llevarlo con su receta a la farmacia.  - Tambin  puede pasar por nuestra oficina durante el horario de atencin regular y Education officer, museum una tarjeta de cupones de GoodRx.  - Si necesita que su receta se enve electrnicamente a una farmacia diferente, informe a nuestra oficina a travs de MyChart de [place] o por telfono llamando al [phone] y presione la opcin 4.

## 2023-07-02 ENCOUNTER — Encounter: Payer: Self-pay | Admitting: Dermatology

## 2023-07-02 LAB — ANTINUCLEAR ANTIBODIES, IFA: ANA Titer 1: NEGATIVE

## 2023-07-17 ENCOUNTER — Telehealth: Payer: Self-pay

## 2023-07-17 NOTE — Telephone Encounter (Signed)
 Patient called and stated that she needs her medication refilled that [facility] prescribed her; would like for a nurse to call her.  I am not sure which medication that she needs.

## 2023-07-18 ENCOUNTER — Other Ambulatory Visit: Payer: Self-pay | Admitting: Women's Health

## 2023-07-18 MED ORDER — VALACYCLOVIR HCL 1 G PO TABS: 1000.0000 mg | ORAL_TABLET | Freq: Two times a day (BID) | ORAL | 0 refills | Status: DC

## 2023-07-18 NOTE — Telephone Encounter (Signed)
 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 Uses 1 g prn as needed.

## 2023-07-25 ENCOUNTER — Other Ambulatory Visit: Payer: Self-pay

## 2023-07-25 ENCOUNTER — Ambulatory Visit: Admitting: Dermatology

## 2023-07-25 ENCOUNTER — Other Ambulatory Visit: Payer: Self-pay | Admitting: Dermatology

## 2023-07-25 DIAGNOSIS — L661 Lichen planopilaris, unspecified: Secondary | ICD-10-CM | POA: Diagnosis not present

## 2023-07-25 DIAGNOSIS — L309 Dermatitis, unspecified: Secondary | ICD-10-CM

## 2023-07-25 DIAGNOSIS — R238 Other skin changes: Secondary | ICD-10-CM | POA: Diagnosis not present

## 2023-07-25 DIAGNOSIS — L719 Rosacea, unspecified: Secondary | ICD-10-CM

## 2023-07-25 DIAGNOSIS — L910 Hypertrophic scar: Secondary | ICD-10-CM

## 2023-07-25 DIAGNOSIS — L438 Other lichen planus: Secondary | ICD-10-CM

## 2023-07-25 MED ORDER — EUCRISA 2 % EX OINT: TOPICAL_OINTMENT | CUTANEOUS | 1 refills | Status: DC

## 2023-07-25 MED ORDER — AZELAIC ACID 15 % EX GEL: CUTANEOUS | 2 refills | Status: DC

## 2023-07-25 MED ORDER — TACROLIMUS 0.1 % EX OINT: TOPICAL_OINTMENT | Freq: Two times a day (BID) | CUTANEOUS | 2 refills | Status: DC

## 2023-07-25 NOTE — Patient Instructions (Addendum)
 Start tacrolimus  ointment - apply to eyelids once to twice daily until improved.  Restart azelaic acid  gel every morning to affected areas face.  Hydroquinone: 8%, Tretinoin : 0.1%, Kojic Acid: 1%, Niacinamide: 4%, Fluocinolone: 0.025% Cream Apply to dark spots every night as tolerated.   Instructions for [facility] Medications  One or more of your medications was sent to the [facility] mail order compounding pharmacy. You will receive an email from them and can purchase the medicine through that link. It will then be mailed to your home at the address you confirmed. If for any reason you do not receive an email from them, please check your spam folder. If you still do not find the email, please let us  know. [facility] phone number is [phone].  Due to recent changes in healthcare laws, you may see results of your pathology and/or laboratory studies on MyChart before the doctors have had a chance to review them. We understand that in some cases there may be results that are confusing or concerning to you. Please understand that not all results are received at the same time and often the doctors may need to interpret multiple results in order to provide you with the best plan of care or course of treatment. Therefore, we ask that you please give us  2 business days to thoroughly review all your results before contacting the office for clarification. Should we see a critical lab result, you will be contacted sooner.   If You Need Anything After Your Visit  If you have any questions or concerns for your doctor, please call our main line at [phone] and press option 4 to reach your doctor's medical assistant. If no one answers, please leave a voicemail as directed and we will return your call as soon as possible. Messages left after 4 pm will be answered the following business day.   You may also send us  a message via MyChart. We typically respond to MyChart messages within 1-2  business days.  For prescription refills, please ask your pharmacy to contact our office. Our fax number is [phone].  If you have an urgent issue when the clinic is closed that cannot wait until the next business day, you can page your doctor at the number below.    Please note that while we do our best to be available for urgent issues outside of office hours, we are not available 24/7.   If you have an urgent issue and are unable to reach us , you may choose to seek medical care at your doctor's office, retail clinic, urgent care center, or emergency room.  If you have a medical emergency, please immediately call 911 or go to the emergency department.  Pager Numbers  - Dr. Bary Likes: [phone]  - Dr. Annette Barters: [phone]  - Dr. Felipe Horton: [phone]   In the event of inclement weather, please call our main line at [phone] for an update on the status of any delays or closures.  Dermatology Medication Tips: Please keep the boxes that topical medications come in in order to help keep track of the instructions about where and how to use these. Pharmacies typically print the medication instructions only on the boxes and not directly on the medication tubes.   If your medication is too expensive, please contact our office at [phone] option 4 or send us  a message through MyChart.   We are unable to tell what your co-pay for medications will be in advance as this is different depending on your  insurance coverage. However, we may be able to find a substitute medication at lower cost or fill out paperwork to get insurance to cover a needed medication.   If a prior authorization is required to get your medication covered by your insurance company, please allow us  1-2 business days to complete this process.  Drug prices often vary depending on where the prescription is filled and some pharmacies may offer cheaper prices.  The website www.goodrx.com contains coupons for  medications through different pharmacies. The prices here do not account for what the cost may be with help from insurance (it may be cheaper with your insurance), but the website can give you the price if you did not use any insurance.  - You can print the associated coupon and take it with your prescription to the pharmacy.  - You may also stop by our office during regular business hours and pick up a GoodRx coupon card.  - If you need your prescription sent electronically to a different pharmacy, notify our office through [facility] or by phone at [phone] option 4.     Si Usted Necesita Algo Despus de Su Visita  Tambin puede enviarnos un mensaje a travs de Clinical cytogeneticist. Por lo general respondemos a los mensajes de MyChart en el transcurso de 1 a 2 das hbiles.  Para renovar recetas, por favor pida a su farmacia que se ponga en contacto con nuestra oficina. Franz Jacks de fax es [place] [phone].  Si tiene un asunto urgente cuando la clnica est cerrada y que no puede esperar hasta el siguiente da hbil, puede llamar/localizar a su doctor(a) al nmero que aparece a continuacin.   Por favor, tenga en cuenta que aunque hacemos todo lo posible para estar disponibles para asuntos urgentes fuera del horario de [place], no estamos disponibles las 24 horas del da, los 7 [address]  [address] de la [place].   Si tiene un problema urgente y no puede comunicarse con nosotros, puede optar por buscar atencin mdica  en el consultorio de su doctor(a), en una clnica privada, en un centro de atencin urgente o en una sala de emergencias.  Si tiene Engineer, drilling, por favor llame inmediatamente al 911 o vaya a la sala de emergencias.  Nmeros de bper  - Dr. Bary Likes: [phone]  - Dra. Annette Barters: [phone]  - Dr. Felipe Horton: [phone]   En caso de inclemencias del tiempo, por favor llame a Lajuan Pila principal al [phone] para una actualizacin sobre el [place] de cualquier retraso  o cierre.  Consejos para la medicacin en dermatologa: Por favor, guarde las cajas en las que vienen los medicamentos de uso tpico para ayudarle a seguir las instrucciones sobre dnde y cmo usarlos. Las farmacias generalmente imprimen las instrucciones del medicamento slo en las cajas y no directamente en los tubos del [place].   Si su medicamento es muy caro, por favor, pngase en contacto con Bettyjane Brunet llamando al [phone] y presione la opcin 4 o envenos un mensaje a travs de Clinical cytogeneticist.   No podemos decirle cul ser su copago por los medicamentos por adelantado ya que esto es diferente dependiendo de la cobertura de su seguro. Sin embargo, es posible que podamos encontrar un medicamento sustituto a Audiological scientist un formulario para que el seguro cubra el medicamento que se considera necesario.   Si se requiere una autorizacin previa para que su compaa de seguros [place] su medicamento, por favor permtanos de 1 a 2 das hbiles para completar este proceso.  Los precios de los medicamentos varan con frecuencia dependiendo del Environmental consultant de dnde se surte la receta y alguna farmacias pueden ofrecer precios ms baratos.  El sitio web www.goodrx.com tiene cupones para medicamentos de Health and safety inspector. Los precios aqu no tienen en cuenta lo que podra costar con la ayuda del seguro (puede ser ms barato con su seguro), pero el sitio web puede darle el precio si no utiliz Tourist information centre manager.  - Puede imprimir el cupn correspondiente y llevarlo con su receta a la farmacia.  - Tambin puede pasar por nuestra oficina durante el horario de atencin regular y Education officer, museum una tarjeta de cupones de GoodRx.  - Si necesita que su receta se enve electrnicamente a una farmacia diferente, informe a nuestra oficina a travs de MyChart de [place] o por telfono llamando al [phone] y presione la opcin 4.

## 2023-07-25 NOTE — Progress Notes (Signed)
 Follow-Up Visit   Subjective  Kathryn Oconnell is a 58 y.o. female who presents for the following: Rosacea of the face. She was not able to tolerate metronidazole  cream or doxycycline , Opzelura  cream didn't help any. She has redness of the eyelids, no scaling or dryness.  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 She is taking minoxidil  2.5 mg daily, no side effects.   The patient has spots, moles and lesions to be evaluated, some may be new or changing.   This patient is accompanied in the office by her  son's fiance', Kathryn Oconnell .   The following portions of the chart were reviewed this encounter and updated as appropriate: medications, allergies, medical history  Review of Systems:  No other skin or systemic complaints except as noted in HPI or Assessment and Plan.  Objective  Well appearing patient in no apparent distress; mood and affect are within normal limits.  A focused examination was performed of the following areas: Face  Relevant physical exam findings are noted in the Assessment and Plan.         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 Condition is symptomatic/ bothersome to patient. Not currently at goal.    Treatment Plan: Hydroquinone: 8%, Tretinoin : 0.1%, Kojic Acid: 1%, Niacinamide: 4%, Fluocinolone: 0.025% Cream at bedtime as tolerated x 2 months Spf 30+ sunscreen qAM  Continue minoxidil  2.5 mg daily, tolerating with no side effects, has some hair growth  Doses of oral minoxidil  for  hair loss are considered 'low dose'. This is because the doses used for hair loss are much lower than the doses which are used for conditions such as high blood pressure (hypertension). The doses used for hypertension are 10-40mg  per day.  Side effects are uncommon at the low doses (up to 2.5 mg/day) used to treat hair loss. Potential side effects, more commonly seen at higher doses, include: Increase in hair growth (hypertrichosis) elsewhere on face and body Temporary hair shedding upon starting medication which may last up to 4 weeks Ankle swelling, fluid retention, rapid weight gain more than 5 pounds Low blood pressure and feeling lightheaded or dizzy when standing up quickly Fast or irregular heartbeat Headaches  Topical retinoid medications like tretinoin /Retin-A , adapalene/Differin, tazarotene/Fabior, and Epiduo/Epiduo Forte can cause dryness and irritation when first started. Only apply a pea-sized amount to the entire affected area. Avoid applying it around the eyes, edges of mouth and creases at the nose. If you experience irritation, use a good moisturizer first and/or apply the medicine less often. If you are doing well with the medicine, you can increase how often you use it until you are applying every night. Be careful with sun protection while using this medication as it can make you sensitive to the sun. This medicine should not be used by pregnant women.   LICHEN PLANUS PIGMENTOSUS   Related Medications triamcinolone  (KENALOG ) 0.025 % cream APPLY TOPICALLY TWICE DAILY FOR 1 WEEK TO DARK SPOTS AT FACE. Azelaic Acid  15 % gel Apply to affected areas face every morning. tacrolimus  (PROTOPIC ) 0.1 % ointment Apply topically 2 (two) times daily. ROSACEA vs  DERMATITIS Exam: Erythema and edema of the upper eyelids, no scale, mild erythema medial cheeks.  Chronic and persistent condition with duration or expected duration over one year. Condition is symptomatic/ bothersome to patient.  Not currently at goal. Metrocream  and Doxy- caused side effects. Opzelura  did not help   Rosacea is a chronic progressive skin condition usually affecting the face of adults, causing redness and/or acne bumps. It is treatable but not curable. It sometimes affects the eyes (ocular rosacea) as well. It may respond to topical and/or systemic medication and can flare with stress, sun exposure, alcohol , exercise, topical steroids (including hydrocortisone /cortisone 10) and some foods.  Daily application of broad spectrum spf 30+ sunscreen to face is recommended to reduce flares.   Treatment Plan Patient stopped doxy due to GI side effect and metronidazole  cream due to irritation Start tacrolimus  0.1% ointment apply to affected areas eyelids once to twice daily. Restart azelaic acid  15% gel to mid face and dark spots every morning  Biteline vs hypertrophic scar Exam: Linear firm hypopigmented papule at central mucosal lower lip  Recommend have evaluated by dentist.   Return in about 2 months (around 09/24/2023).  Kathryn Oconnell, CMA, am acting as scribe for Kathryn Larry, MD .   Documentation: I have reviewed the above documentation for accuracy and completeness, and I agree with the above.  Kathryn Larry, MD

## 2023-07-25 NOTE — Telephone Encounter (Signed)
 Tacrolimus  ointment and Eucrisa  ointment not covered by patient's insurance. All pharmacy options not appropriate for eyelids (topical steroids, risk of glaucoma). Patient will use GoodRx card to purchase tacrolimus  0.1% ointment.

## 2023-07-25 NOTE — Progress Notes (Signed)
 Rx change, tacrolimus  ointment not covered.

## 2023-09-18 ENCOUNTER — Ambulatory Visit: Admitting: Dermatology

## 2023-10-04 NOTE — Discharge Summary (Signed)
 [facility] Medicine Discharge Summary  Admit Date: 10/01/2023 Discharge Date: 10/04/2023  Admitting Physician: Debby Gwenn Lamp, MD Discharge Physician: FRANCHOT ELSIE PARADISE, MD   Primary Care Provider: Salome Pack, Phone [phone]  Discharge Destination: Home  Admission Diagnoses:  AKI (acute kidney injury) () [N17.9] Diabetic ketoacidosis without coma associated with type 1 diabetes mellitus (CMS/HHS-HCC) [E10.10] Acute pharyngitis, unspecified etiology [J02.9]  Discharge Diagnoses:  Principal Problem:   Diabetic ketoacidosis without coma associated with type 1 diabetes mellitus (CMS/HHS-HCC) Active Problems:   GERD (gastroesophageal reflux disease)   Hypothyroidism   CKD (chronic kidney disease) stage 3, GFR 30-59 ml/min (CMS/HHS-HCC)   Benign essential hypertension   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) (CMS/HHS-HCC) Resolved Problems:   Type 2 diabetes mellitus with peripheral neuropathy (CMS/HHS-HCC)  Primary Diagnosis: Admitted for DKA, also found to have oral thrush  Changes Made (with rationale):  Nystatin Swish and Swallow 5mL QID to complete 14 day course Diabetes Regimen Changes: - Glargine 10 units qHs - Aspart 2 units TID with meals - Discontinue Jardiance (given DKA) - Discontinued Mounjaro  To-Do List (incidental findings, follow-up studies, etc.): Follow-up with outpatient endocrinologist Bring insulin  pump to next endocrine visit as it seems to have been malfunctioning Follow-up blood sugars, adjust regimen as needed Follow-up mouth/throat pain on Nystatin  Anticipatory Guidance for Outpatient Care:  If ongoing mouth pain or thrush non-responsive to Nystatin swish and swallow at follow-up visit, could consider PO fluconazole  for treatment of thrush DKA likely triggered by potentially malfunctioning Dexcom sensor/insulin  pump. She would likely benefit from restarting insulin  pump again in the future.     Results  Pending at Discharge:  None Please see phone numbers at end of this summary for lab contact information.   Follow-up/Care Transition Plan: Sched. appts: No future appointments.  Follow-up info: Zarwolo, Gloria, NP [address] [place] [address] [phone]     Maren Das, NP [address] [place] [address] [phone]         Allergies/Intolerances:  Allergies  Allergen Reactions  . Sulfa (Sulfonamide Antibiotics) Abdominal Pain     New Adverse Drug Events: none  Medications:     Current Discharge Medication List     START taking these medications      Instructions  acetaminophen  325 MG tablet Refills: 0 Stop taking on: October 14, 2023  Commonly known as: TYLENOL  Take 2 tablets (650 mg total) by mouth every 6 (six) hours as needed for Pain for up to 10 days   blood glucose diagnostic test strip Quantity: 100 each Refills: 0  1 each (1 strip total) 4 (four) times daily before meals and nightly Product selection permitted according to insurance preference. E10.9 Type 1 diabetes mellitus   blood glucose meter kit Quantity: 1 each Refills: 0  as directed Product selection permitted according to insurance preference. E10.9 Type 1 diabetes mellitus   lancets Quantity: 100 each Refills: 0  Use 1 each 4 (four) times daily before meals and nightly Product selection permitted according to insurance preference. E10.9 Type 1 diabetes mellitus   nystatin 100,000 unit/mL suspension Quantity: 240 mL Refills: 0 Stop taking on: October 16, 2023  Commonly known as: MYCOSTATIN Swish and swallow 5 mLs 4 (four) times daily for 12 days Last time this was given: 5 mLs on October 04, 2023 12:20 PM   phenoL 1.4 % spray Quantity: 20 mL Refills: 0 Stop taking on: October 09, 2023  Commonly known as: Bdpec Asc Show Low Use  as directed in the mouth or throat every 4 (four) hours as needed (Sore throat) for up to 5 days       These medications have been CHANGED       Instructions  insulin  ASPART pen injector (concentration 100 units/mL) Quantity: 15 mL Refills: 0 What changed:  how much to take additional instructions  Commonly known as: NovoLOG  FLEXPEN Inject 2 Units subcutaneously 3 (three) times daily with meals   insulin  GLARGINE pen injector (concentration 100 units/mL) Quantity: 15 mL Refills: 0 What changed: how much to take  Commonly known as: LANTUS  SOLOSTAR Inject 10 Units subcutaneously at bedtime   LORazepam  1 MG tablet Refills: 0 What changed:  when to take this reasons to take this  Commonly known as: ATIVAN  Take 1 tablet (1 mg total) by mouth 3 (three) times daily as needed for Anxiety Last time this was given: 0.5 mg on October 04, 2023  2:14 AM   pen needle, diabetic 31 gauge x 3/16 needle Quantity: 100 each Refills: 0 What changed: Another medication with the same name was added. Make sure you understand how and when to take each.  Use as directed Doctor's comments: Needle length per patient preference or history.   pen needle, diabetic 31 gauge x 3/16 needle Quantity: 100 each Refills: 0 What changed: You were already taking a medication with the same name, and this prescription was added. Make sure you understand how and when to take each.  Use 4 (four) times daily before meals and nightly . For insulin  pen Doctor's comments: Needle length per patient preference or history.       CONTINUE taking these medications      Instructions  aspirin 81 MG EC tablet Refills: 0  Take 81 mg by mouth once daily Last time this was given: 81 mg on October 04, 2023  8:41 AM   atorvastatin  80 MG tablet Refills: 0  Commonly known as: LIPITOR Take 80 mg by mouth once daily Last time this was given: 80 mg on October 04, 2023  8:41 AM   azelaic acid  15 % topical gel Refills: 0  Commonly known as: FINACEA  Apply topically 2 (two) times daily Apply to face   cyanocobalamin  1000 MCG tablet Quantity: 30 tablet Refills: 0   Commonly known as: VITAMIN B12 Take 1 tablet (1,000 mcg total) by mouth once daily Last time this was given: 1,000 mcg on October 04, 2023  8:41 AM   cyclobenzaprine  10 MG tablet Refills: 0  Commonly known as: FLEXERIL  Take 10 mg by mouth at bedtime   levothyroxine  112 MCG tablet Refills: 0  Commonly known as: SYNTHROID  Take 112 mcg by mouth once daily Take on an empty stomach with a glass of water at least 30-60 minutes before breakfast. Last time this was given: 100 mcg on October 04, 2023  6:08 AM   loratadine  10 mg tablet Refills: 0  Commonly known as: CLARITIN  Take 10 mg by mouth once daily Last time this was given: 10 mg on October 04, 2023  8:41 AM   metoprolol  TARTrate 25 MG tablet Refills: 0  Commonly known as: LOPRESSOR  Take 25 mg by mouth 2 (two) times daily Last time this was given: 25 mg on October 03, 2023  8:41 AM   minoxidiL  2.5 MG tablet Refills: 0  Take 2.5 mg by mouth once daily   omeprazole 40 MG DR capsule Refills: 0  Commonly known as: PriLOSEC Take 40 mg by mouth once daily  oxyCODONE  10 mg immediate release tablet Refills: 0  Commonly known as: OxyIR Take 10 mg by mouth every 6 (six) hours as needed for Pain Last time this was given: 10 mg on October 03, 2023  9:03 PM   pregabalin  100 MG capsule Refills: 0  Commonly known as: LYRICA  Take 100 mg by mouth once daily Last time this was given: 25 mg on October 04, 2023  8:42 AM   sodium bicarbonate 650 MG tablet Refills: 0  Take 2 tablets by mouth 2 (two) times daily Last time this was given: 1,300 mg on October 04, 2023  9:12 AM   tretinoin  0.05 % cream Refills: 0  Commonly known as: RETIN-A  Apply topically at bedtime       STOP taking these medications    insulin  LISPRO 100 unit/mL injection Commonly known as: HumaLOG    JARDIANCE 10 mg tablet Generic drug: empagliflozin   MOUNJARO 2.5 mg/0.5 mL pen injector Generic drug: tirzepatide         Brief History of Present Illness:  Kathryn Oconnell is a  58 y.o. female past medical history significant for diabetes, hypertension, thyroid  disease comes in with sore throat and hyperglycemia.   Patient with sore throat for several days.  Having difficulty with swallowing both liquids and solids.  Also having a nonproductive cough.  States this is uncomfortable which is eventually why she came to the emergency room.  However she is also a diabetic on insulin  pump that stopped working for 5 days ago.  Notes that it was beeping and malfunctioning so she turned it off and did not try to obtain a secondary source of insulin .  She was asymptomatic from diabetes standpoint however when she came in she was hyperglycemic ketotic and dehydrated.  She was given IV fluids started on IV insulin  drip.  Her infectious workup including a strep test was negative.  Her white blood cell count was not significantly abnormal she had no fevers she had no other sources of an upper respiratory infection on exam.  She has not been having chills however she did have 102 fever at home.  She has not had any fevers since she has been here.  She had her thyroid  previously removed and is on thyroid  supplementation for which she endorses taking. _____________________   Hospital Course by Problem:  #DKA #Anion gap metabolic acidosis with ketosis #LADA 10/01/23: Presented to [facility] ED with complaint of sore throat and odynophagia with resultant decreased PO intake x 4 days. On initial labs was incidentally found to be in DKA with glucose 575, BHB 0.6, and anion gap of 15. No AMS, nausea, vomiting, SHOB, or specific symptoms of DKA. Also noted to have creatinine of 2.7 (baseline approx. 1.7-2.0) representing AKI on CKD. Received IV fluids and IV insulin . Rapid strep negative. Respiratory viral panel negative. K, phos, and Mg monitored closely and supplemented as needed.   10/02/23: DKA resolving. Endocrinology consulted. Transitioned from IV to subQ insulin  administration. Creatinine trending down.  Continued IV fluids. Initiated tx for oral thrush w/ Nystatin swish-and-swallow.    10/03/23: DKA resolved. Creatinine down to 2.0 (at/near baseline.) New symptoms of nasal congestion + bilateral ear pain and congestion consistent with sinus infection. Respiratory viral panel negative. Initiated supportive care w/ Flonase , Claritin , and Afrin. One afternoon episode of asymptomatic hypoglycemia down to 38 just before lunch. Normoglycemic on subsequent recheck. Appropriate adjustments made to insulin  regimen per Endocrinology recs. Wound Care Consult for POA deep tissue pressure injuries and  callouses.    10/04/23: No hypoglycemic events. Glucose reasonably well controlled, ranging from 90s to 257. Creatinine down to 1.8 (pt's baseline.) Oral thrush appears to be improving. Pt eating and drinking well. Sinus congestion responding well to supportive care. Pt medically ready for discharge.  Discharge plan: - Glargine 10 units qHs - Aspart 2 units TID with meals - Discontinue Jardiance (given DKA) - Discontinued Mounjaro  #Hypokalemia #Hypophosphatemia - Supplemented with IV K phos  #Oral thrush #Sore throat secondary to above Exam consistent with oral thrush. Known risk factors of poorly controlled diabetes and recent ABX use (Azithro for sinus infection last week.)  Started on PO nystatin swish and swallow QID Thrush appears to be improving on exam. Mouth pain now beginning to improve.  Plan: - Nystatin Swish and Swallow 5mL QID to complete 14 day course - If ongoing mouth pain or thrush non-responsive to Nystatin swish and swallow at follow-up visit, could consider PO fluconazole  for treatment of thrush - Tylenol /Chloraseptic spray prn for pain   #AKI on CKD Creatinine 2.7 at admission secondary to volume depletion/DKA. Baseline creatinine seems to be approximately 1.7-2.0.  - Continued PO sodium bicarb which she takes at home - Given IV fluid bolus followed by maintenance fluids and  treatment of DKA. Creatinine improved and was 1.8 at discharge.   # Hyperlipidemia - Continue statin - Aspirin   #Vitamin B12 deficiency - Continue supplementation   Hypothyroidism - Continue Synthroid    #Hypertension - Continue metoprolol  - Resume home minoxidil  at discharge    #Neuropathy - Continue Lyrica    #GERD - Continue Protonix    #Depression - Continue sertraline   Comorbid Conditions: Nutritional Disorders:   Malnutrition:  Malnutrition identified in prior encounter. Will consider nutrition consult and supplements.     Hypoalbuminemia:  Hypoalbuminemia present with lowest albumin of 2.7.   Hypoalbuminemia is associated with increased risk for patients.  We will attempt to treat the underlying condition(s) contributing to this low albumin state. Electrolyte Disorders:    Hypokalemia:  Hypokalemia present with lowest potassium of 3.4.   Will continue to monitor.   Hematologic Disorders:    Thrombocytopenia:  Thrombocytopenia present with lowest platelet count of 133.  Will continue to monitor and assess for bleeding complications.        Surgeries and Procedures Performed:  None  _____________________  Discharge Exam:  BP (!) 149/65 (BP Location: Left upper arm, Patient Position: Lying)   Pulse 81   Temp 36.8 C (98.2 F) (Oral)   Resp 18   Ht 162.6 cm (5' 4)   Wt 55.8 kg (123 lb)   SpO2 98%   BMI 21.11 kg/m    Physical Exam: BP (!) 149/65 (BP Location: Left upper arm, Patient Position: Lying)   Pulse 81   Temp 36.8 C (98.2 F) (Oral)   Resp 18   Ht 162.6 cm (5' 4)   Wt 55.8 kg (123 lb)   SpO2 98%   BMI 21.11 kg/m  Constitutional: age appropriate, well developed, well appearing HEENT: [place]/AT, PERRL. Thrush lesions improving Neck: supple without lymphadenopathy Cardiovascular: Heart sounds regular rate and rhythm, S1 & S2 without S3 or S4 gallops, rubs, or murmurs. Radial pulses +2 bilaterally. Respiratory: Lungs clear to auscultation  bilaterally. No rales, wheezes, or rhonchi. No respiratory distress. Abdomen: Soft, nontenderness, BS present. Extremities: No cyanosis, no edema.  Pertinent Lab Testing: Recent Labs  Lab 10/02/23 1817 10/03/23 0552 10/04/23 0544  NA 136 137 135  K 3.7 3.9 3.4*  CL  110* 113* 108  CO2 19* 17* 22  BUN 29* 24* 19  CREATININE 2.2* 2.0* 1.8*  GLUCOSE 181* 159* 198*  CALCIUM  8.8 8.7 8.6*   Recent Labs  Lab 10/01/23 1951  AST 22  ALT 18  ALKPHOS 82  TBILI 0.8    Recent Labs  Lab 10/02/23 0315 10/03/23 0552 10/04/23 0544  WBC 8.7 7.1 7.6  HGB 14.3 13.2 12.0  HCT 41.3 40.0 36.6  PLT 161 148* 133*   No results for input(s): APTT, INR in the last 168 hours.   Other Pertinent Labs:    Micro:  No results found for: BLDCULT  No results found for this visit on 10/01/23. No results found for: URCULT  No results found for this visit on 10/01/23. Lab Results  Component Value Date   SARSCOV2 Not Detected 10/03/2023   SARSCOV2 Not Detected 11/20/2022   SARSCOV2 Not Detected 11/16/2022   Lab Results  Component Value Date   FLUARNA Not Detected 10/03/2023   FLUBRNA Not Detected 10/03/2023   RSVRNA Not Detected 10/03/2023      Pertinent Imaging:   No results found.  _____________________  Code Status: Full Code Goals of care were not addressed during this admission.   Status on Discharge:  Current activity: Walks frequently (10/04/23 0900) Current mobility: No limitation (10/04/23 0900)  Activity Recommendation: activity as tolerated  Other Discharge Instructions: Services setup at discharge: None Tubes/lines at discharge: None  Diet: Diet carbohydrate level 2 (60 gm/meal) Oral Supplements - Adult All Supplements; Boost Glucose Control-Chocolate; Lunch  Wound Care Order Instructions     None       _____________________  Time spent on discharge process: 45 minutes    CARTER ELSIE PARADISE, MD [facility]  10/04/2023   Hospital  Contact Information:  [facility]) [facility]) [facility])  Pending tests:  Laboratory: [phone] Microbiology: [phone] Pathology: [phone] Radiology: [phone]  General questions: [phone] Pending tests: Laboratory: [phone] Microbiology: [phone] Pathology: [phone] Radiology: [phone]  General questions:  [phone] Pending tests:  Laboratory: [phone] Microbiology: [phone] Pathology: [phone] Radiology: [phone]  General questions:  [phone]

## 2023-10-08 ENCOUNTER — Telehealth: Payer: Self-pay

## 2023-10-08 NOTE — Transitions of Care (Post Inpatient/ED Visit) (Signed)
   10/08/2023  Name: Kathryn Oconnell MRN: [id number] DOB: 03-14-1966  Today's TOC FU Call Status: Today's TOC FU Call Status:: Unsuccessful Call (1st Attempt) Unsuccessful Call (1st Attempt) Date: 10/08/23  Attempted to reach the patient regarding the most recent Inpatient/ED visit.  Follow Up Plan: Additional outreach attempts will be made to reach the patient to complete the Transitions of Care (Post Inpatient/ED visit) call.  Alan Ee, RN, BSN, CEN [employer]- Transition of Care Team.  [facility] [phone]

## 2023-10-09 ENCOUNTER — Other Ambulatory Visit: Payer: Self-pay | Admitting: Adult Health

## 2023-10-09 ENCOUNTER — Telehealth: Payer: Self-pay

## 2023-10-09 MED ORDER — VALACYCLOVIR HCL 1 G PO TABS: 1000.0000 mg | ORAL_TABLET | Freq: Two times a day (BID) | ORAL | 1 refills | Status: DC

## 2023-10-09 NOTE — Progress Notes (Signed)
 Refilled valtrex

## 2023-10-09 NOTE — Transitions of Care (Post Inpatient/ED Visit) (Signed)
   10/09/2023  Name: Kathryn Oconnell MRN: [id number] DOB: February 20, 1966  Today's TOC FU Call Status: Today's TOC FU Call Status:: Unsuccessful Call (2nd Attempt) Unsuccessful Call (2nd Attempt) Date: 10/09/23  Attempted to reach the patient regarding the most recent Inpatient/ED visit.  Follow Up Plan: Additional outreach attempts will be made to reach the patient to complete the Transitions of Care (Post Inpatient/ED visit) call.    Alan Ee, RN, BSN, CEN [employer]- Transition of Care Team.  [facility] [phone]

## 2023-10-10 ENCOUNTER — Telehealth: Payer: Self-pay

## 2023-10-10 DIAGNOSIS — E1010 Type 1 diabetes mellitus with ketoacidosis without coma: Secondary | ICD-10-CM

## 2023-10-10 NOTE — Transitions of Care (Post Inpatient/ED Visit) (Signed)
 10/10/2023  Name: Kathryn Oconnell MRN: [id number] DOB: 10-Jul-1965  Today's TOC FU Call Status: Today's TOC FU Call Status:: Successful TOC FU Call Completed TOC FU Call Complete Date: 10/10/23 Patient's Name and Date of Birth confirmed.  Transition Care Management Follow-up Telephone Call How have you been since you were released from the hospital?: Better (Reports that she is feeling much better. CBG 120-225.) Any questions or concerns?: No  Items Reviewed: Did you receive and understand the discharge instructions provided?: Yes Medications obtained,verified, and reconciled?: Yes (Medications Reviewed) Any new allergies since your discharge?: No Dietary orders reviewed?: Yes Type of Diet Ordered:: DM diet-  no questions about diet. Do you have support at home?: Yes People in Home [RPT]: child(ren), adult Name of Support/Comfort Primary Source: son  Medications Reviewed Today: Medications Reviewed Today     Reviewed by Rumalda Alan PENNER, RN (Registered Nurse) on 10/10/23 at 1431  Med List Status: <None>   Medication Order Taking? Sig Documenting Provider Last Dose Status Informant  aspirin 81 MG tablet [id number] Yes Take 81 mg by mouth daily. [provider]  Active   atorvastatin  (LIPITOR) 80 MG tablet [id number] Yes TAKE 1 TABLET BY MOUTH EVERY DAY Zarwolo, Gloria, FNP  Active   Azelaic Acid  15 % gel [id number] Yes Apply to affected areas face every morning. Jackquline Sawyer, MD  Active   BAQSIMI TWO PACK 3 MG/DOSE POWD [id number]  Place into both nostrils.  Patient not taking: Reported on 10/10/2023   [provider]  Active   brexpiprazole  (REXULTI ) 1 MG TABS tablet [id number]  Take 1 tablet (1 mg total) by mouth daily.  Patient not taking: Reported on 10/10/2023   Golda Lynwood PARAS, MD  Active   clobetasol  (TEMOVATE ) 0.05 % external solution [id number]  Apply 1 application topically 2 (two) times daily. To affected areas at the scalp. Avoid applying to face, groin, and axilla.   Patient not taking: Reported on 10/10/2023   Moye, [place] , MD  Active   clobetasol  ointment (TEMOVATE ) 0.05 % [id number]  Apply twice daily to affected areas on elbows, avoid face, groin, axilla  Patient not taking: Reported on 10/10/2023   Jackquline Sawyer, MD  Active   Continuous Blood Gluc Sensor (DEXCOM G6 SENSOR) MISC [id number] Yes 1 Device by Does not apply route as directed. [place], Raymond J, MD  Active   Continuous Blood Gluc Transmit (DEXCOM G6 TRANSMITTER) MISC [id number] Yes 1 Device by Does not apply route as directed. [place], Raymond J, MD  Active   Crisaborole  (EUCRISA ) 2 % MARCH [id number]  Apply to eyelids twice daily until improved.  Patient not taking: Reported on 10/10/2023   Jackquline Sawyer, MD  Active   cyanocobalamin  (VITAMIN B12) 1000 MCG tablet [id number] Yes Take 1,000 mcg by mouth. [provider]  Active   cyclobenzaprine  (FLEXERIL ) 10 MG tablet [id number]  Take 1 tablet (10 mg total) by mouth 3 (three) times daily as needed.  Patient not taking: Reported on 10/10/2023   [place], Quintin PARAS, MD  Active   doxycycline  (MONODOX ) 100 MG capsule [id number]  Take 1 capsule (100 mg total) by mouth 2 (two) times daily. Take with food  Patient not taking: Reported on 10/10/2023   Claudene Lehmann, MD  Active   fluconazole  (DIFLUCAN ) 150 MG tablet [id number]  TAKE 1 TABLET NOW AND 1 TABLET IN 3 DAYS  Patient not taking: Reported on 10/10/2023   Signa Nest A, NP  Active   glucose blood test strip  [id number] Yes 1 each by Other route 4 (four) times daily. Use as instructed to check blood sugar four times daily. [provider]  Active   halobetasol  (ULTRAVATE ) 0.05 % ointment [id number]  Apply topically 2 (two) times daily.  Patient not taking: Reported on 10/10/2023   [facility], [place] , MD  Active   HYDROQUINONE-HC-TRETINOIN  EX [id number] Yes Apply 1 Application topically at bedtime. [provider]  Active   insulin  aspart (NOVOLOG ) 100 UNIT/ML FlexPen [id number]   Inject 5-11 Units into the skin 3 (three) times daily before meals. Use up to 50U QD SQ per insulin  pump  Patient not taking: Reported on 10/10/2023   [place], Quintin PARAS, MD  Active   insulin  glargine (LANTUS  SOLOSTAR) 100 UNIT/ML Solostar Pen [id number] Yes Inject 15 Units into the skin daily.  Patient taking differently: Inject 15 Units into the skin daily.   [place], Raymond J, MD  Active   Insulin  Pen Needle (BD PEN NEEDLE MICRO U/F) 32G X 6 MM MISC [id number] Yes USE AS DIRECTED [place], Raymond J, MD  Active   ipratropium (ATROVENT ) 0.06 % nasal spray [id number] Yes PLACE 2 SPRAYS INTO THE NOSE 3 (THREE) TIMES DAILY. [place], Raymond J, MD  Active   JARDIANCE 10 MG TABS tablet [id number]  Take 10 mg by mouth daily.  Patient not taking: Reported on 10/10/2023   [provider]  Active   levothyroxine  (SYNTHROID ) 100 MCG tablet [id number] Yes Take 1 tablet (100 mcg total) by mouth daily before breakfast.  Patient taking differently: Take 1 tablet (100 mcg total) by mouth daily before breakfast.   [place], Raymond J, MD  Active   lidocaine (LIDODERM) 5 % [id number]  SMARTSIG:1-3 Patch(s) Topical  Patient not taking: Reported on 10/10/2023   [provider]  Active   LINZESS  145 MCG CAPS capsule [id number]  Take 1 capsule (145 mcg total) by mouth daily.  Patient not taking: Reported on 10/10/2023   [place], Quintin PARAS, MD  Active   loratadine  (CLARITIN ) 10 MG tablet [id number] Yes Take 10 mg by mouth daily. [provider]  Active   LORazepam  (ATIVAN ) 1 MG tablet [id number] Yes Take 1 tablet (1 mg total) by mouth 3 (three) times daily as needed for anxiety. Zarwolo, Gloria, FNP  Active   medroxyPROGESTERone  (PROVERA ) 2.5 MG tablet [id number]  TAKE 1 TABLET BY MOUTH ONCE DAILY. APPOINTMENT REQUIRED FOR FUTURE REFILLS  Patient not taking: Reported on 10/10/2023   Signa Delon LABOR, NP  Active   metoprolol  tartrate (LOPRESSOR ) 25 MG tablet [id number] Yes Take 1 tablet (25 mg total) by  mouth 2 (two) times daily. [place], Raymond J, MD  Active   metroNIDAZOLE  (METROCREAM ) 0.75 % cream [id number]  Apply topically 2 (two) times daily. To face  Patient not taking: Reported on 10/10/2023   Claudene Lehmann, MD  Active   minoxidil  (LONITEN ) 2.5 MG tablet [id number] Yes Take 1 tablet (2.5 mg total) by mouth daily. Claudene Lehmann, MD  Active   MOUNJARO 2.5 MG/0.5ML Pen [id number]  SMARTSIG:2.5 Milligram(s) SUB-Q Once a Week  Patient not taking: Reported on 10/10/2023   [provider]  Active   mupirocin  ointment (BACTROBAN ) 2 % [id number]  Apply topically daily.  Patient not taking: Reported on 10/10/2023   Moye, [place] , MD  Active   omeprazole (PRILOSEC) 40 MG capsule [id number] Yes Take 40 mg by mouth every morning. [provider]  Active   Oxycodone  HCl 10 MG TABS [id number] Yes Take  10 mg by mouth 4 (four) times daily as needed. [provider]  Active   pregabalin  (LYRICA ) 25 MG capsule [id number] Yes Take 25 mg by mouth 4 (four) times daily.  Patient taking differently: Take 25 mg by mouth 4 (four) times daily.   [provider]  Active   promethazine  (PHENERGAN ) 25 MG tablet [id number]  Take 1 tablet (25 mg total) by mouth as needed for nausea or vomiting.  Patient not taking: Reported on 10/10/2023   Zarwolo, Gloria, FNP  Active   Ruxolitinib Phosphate  (OPZELURA ) 1.5 % CREA [id number] Yes Apply twice daily to affected areas as needed Claudene Lehmann, MD  Active   Semaglutide , 1 MG/DOSE, 4 MG/3ML SOPN [id number]  Inject 1 mg into the skin once a week.  Patient not taking: Reported on 10/10/2023   [place], Quintin PARAS, MD  Active   Sertraline  HCl 200 MG CAPS [id number] Yes Take 1 capsule by mouth every morning.  Patient taking differently: Take 1 capsule by mouth every morning.   [place], Raymond J, MD  Active   sodium bicarbonate 650 MG tablet [id number] Yes Take 650 mg by mouth 2 (two) times daily. [provider]  Active   sucralfate  (CARAFATE) 1 g tablet [id number]  Take 1 g by mouth 3 (three) times daily.  Patient not taking: Reported on 10/10/2023   [provider]  Active   tacrolimus  (PROTOPIC ) 0.1 % ointment [id number]  Apply topically 2 (two) times daily.  Patient not taking: Reported on 10/10/2023   Jackquline Sawyer, MD  Active   triamcinolone  (KENALOG ) 0.025 % cream [id number]  APPLY TOPICALLY TWICE DAILY FOR 1 WEEK TO DARK SPOTS AT FACE.  Patient not taking: Reported on 10/10/2023   Moye, [place] , MD  Active   valACYclovir  (VALTREX ) 1000 MG tablet [id number] Yes Take 1 tablet (1,000 mg total) by mouth 2 (two) times daily. X 7 days Signa Nest A, NP  Active   Vitamin D , Ergocalciferol , (DRISDOL ) 1.25 MG (50000 UNIT) CAPS capsule [id number]  TAKE 1 CAPSULE (50,000 UNITS TOTAL) BY MOUTH EVERY 7 (SEVEN) DAYS  Patient not taking: Reported on 10/10/2023   Zarwolo, Gloria, FNP  Active   zolpidem  (AMBIEN  CR) 12.5 MG CR tablet [id number]  Take 1 tablet (12.5 mg total) by mouth at bedtime.  Patient not taking: Reported on 10/10/2023   Zarwolo, Gloria, FNP  Active             Home Care and Equipment/Supplies: Were Home Health Services Ordered?: No Any new equipment or medical supplies ordered?: No  Functional Questionnaire: Do you need assistance with bathing/showering or dressing?: No Do you need assistance with meal preparation?: No Do you need assistance with eating?: No Do you have difficulty maintaining continence: No Do you need assistance with getting out of bed/getting out of a chair/moving?: No Do you have difficulty managing or taking your medications?: No  Follow up appointments reviewed: PCP Follow-up appointment confirmed?: Yes Date of PCP follow-up appointment?: 10/11/23 Follow-up Provider: PCP Specialist Hospital Follow-up appointment confirmed?: Yes Date of Specialist follow-up appointment?: 10/18/23 Follow-Up Specialty Provider:: endocrinology Do you need transportation to your follow-up  appointment?: No Do you understand care options if your condition(s) worsen?: Yes-patient verbalized understanding  SDOH Interventions Today    Flowsheet Row Most Recent Value  SDOH Interventions   Food Insecurity Interventions Intervention Not Indicated  Housing Interventions Intervention Not Indicated  Transportation Interventions Intervention Not Indicated  Utilities Interventions Intervention Not Indicated   Patient reports that  she is latent adult stage DM type 1.  Reports she went to the hospital with throat issues and was found to be in DKA and therefore admitted.  Completed initial TOC call and reviewed and offered 30 day TOC program and patient consented.    Goals      VBCI Transitions of Care (TOC) Care Plan     Problems:  Recent Hospitalization for treatment of DM type 1 and DKA Uncontrolled DM  Goal:  Over the next 30 days, the patient will not experience hospital readmission  Interventions:  Transitions of Care: Doctor Visits  - discussed the importance of doctor visits Reviewed Signs and symptoms of infection Reviewed recent labs and HgBA1c of 12.1 on 10/01/2023  Diabetes Interventions: Assessed patient's understanding of A1c goal: <7% Provided education to patient about basic DM disease process Reviewed medications with patient and discussed importance of medication adherence Counseled on importance of regular laboratory monitoring as prescribed Discussed plans with patient for ongoing care management follow up and provided patient with direct contact information for care management team Provided patient with verbal education related to hypo and hyperglycemia and importance of correct treatment Encouraged patient to be observant for how often she has hypoglcemia. Encouraged patient to eat regular meals.  Reviewed importance of protein and limiting bread, pasta , rice and potatoes. Reviewed scheduled/upcoming provider appointments including: PCP and  endocrinologist Advised patient, providing education and rationale, to check cbg as prescribed and record, calling MD for findings outside established parameters Review of patient status, including review of consultants reports, relevant laboratory and other test results, and medications completed Screening for signs and symptoms of depression related to chronic disease state  Assessed social determinant of health barriers Reviewed damage caused by uncontrolled Dm Encouraged patient to follow DM diet Reviewed that patient has had DM education and a nutritionist in the past Reviewed that patient is using her insulin  pump and is given herself a bolus with meals. Reviewed that patient uses her dexacom for continuous Glucose monitoring.  Referral place for pharmacy  Lab Results  Component Value Date   HGBA1C 7.4 04/04/2022  A1C is 12.1 on 10/01/2023 at [facility]  Patient Self Care Activities:  Attend all scheduled provider appointments Call pharmacy for medication refills 3-7 days in advance of running out of medications Call provider office for new concerns or questions  Notify RN Care Manager of TOC call rescheduling needs Participate in Transition of Care Program/Attend TOC scheduled calls Perform all self care activities independently  Perform IADL's (shopping, preparing meals, housekeeping, managing finances) independently Take medications as prescribed   Work with the pharmacist to address medication management needs and will continue to work with the clinical team to address health care and disease management related needs check blood sugar at prescribed times: as suggested by MD and for any high or low readings. check feet daily for cuts, sores or redness enter blood sugar readings and medication or insulin  into daily log take the blood sugar log to all doctor visits trim toenails straight across drink 6 to 8 glasses of water each day fill half of plate with vegetables limit fast  food meals to no more than 1 per week wash and dry feet carefully every day wear comfortable, cotton socks wear comfortable, well-fitting shoes See your DM specialist   Plan:  Telephone follow up appointment with care management team member scheduled for:  10/17/2023 with Dionne Leath RN for Transition of care team. Referral to pharmacy for DM management.  Provided  my contact information for patient to call if needed.        Alan Ee, RN, BSN, CEN [employer]- Transition of Care Team.  [facility] [phone]

## 2023-10-11 ENCOUNTER — Telehealth: Payer: Self-pay

## 2023-10-11 ENCOUNTER — Inpatient Hospital Stay: Payer: Self-pay | Admitting: Nurse Practitioner

## 2023-10-11 NOTE — Progress Notes (Signed)
 Care Guide Pharmacy Note  10/11/2023 Name: Kathryn Oconnell MRN: [id number] DOB: [birth year]  Referred By: Zarwolo, Gloria, FNP Reason for referral: Complex Care Management (Outreach to schedule with Pharm d )   Kathryn Oconnell is a 58 y.o. year old female who is a primary care patient of Zarwolo, Gloria, FNP.  Kathryn Oconnell was referred to the pharmacist for assistance related to: DMII  An unsuccessful telephone outreach was attempted today to contact the patient who was referred to the pharmacy team for assistance with medication management. Additional attempts will be made to contact the patient.  Jeoffrey Buffalo , RMA     [facility]  [facility] Guide  Direct Dial: [phone]  Website: [email or web address]

## 2023-10-15 NOTE — Progress Notes (Signed)
 Care Guide Pharmacy Note  10/15/2023 Name: Kathryn Oconnell MRN: [id number] DOB: 1965-05-21  Referred By: Zarwolo, Gloria, FNP Reason for referral: Complex Care Management (Outreach to schedule with Pharm d )   Kathryn Oconnell is a 58 y.o. year old female who is a primary care patient of Zarwolo, Gloria, FNP.  Kathryn Oconnell was referred to the pharmacist for assistance related to: DMII  Successful contact was made with the patient to discuss pharmacy services including being ready for the pharmacist to call at least 5 minutes before the scheduled appointment time and to have medication bottles and any blood pressure readings ready for review. The patient agreed to meet with the pharmacist via telephone visit on (date/time).10/22/2023  Jeoffrey Buffalo , RMA     [place]  [facility], [facility] Guide  Direct Dial: [phone]  Website: [email or web address]

## 2023-10-16 ENCOUNTER — Ambulatory Visit: Admitting: Nurse Practitioner

## 2023-10-16 ENCOUNTER — Other Ambulatory Visit: Payer: Self-pay | Admitting: Dermatology

## 2023-10-16 ENCOUNTER — Encounter: Payer: Self-pay | Admitting: Nurse Practitioner

## 2023-10-16 VITALS — BP 104/62 | HR 88 | Ht 64.0 in | Wt 140.8 lb

## 2023-10-16 DIAGNOSIS — I10 Essential (primary) hypertension: Secondary | ICD-10-CM | POA: Diagnosis not present

## 2023-10-16 DIAGNOSIS — Z720 Tobacco use: Secondary | ICD-10-CM

## 2023-10-16 DIAGNOSIS — E559 Vitamin D deficiency, unspecified: Secondary | ICD-10-CM

## 2023-10-16 DIAGNOSIS — F419 Anxiety disorder, unspecified: Secondary | ICD-10-CM

## 2023-10-16 DIAGNOSIS — L438 Other lichen planus: Secondary | ICD-10-CM

## 2023-10-16 DIAGNOSIS — J410 Simple chronic bronchitis: Secondary | ICD-10-CM | POA: Diagnosis not present

## 2023-10-16 MED ORDER — VITAMIN D (ERGOCALCIFEROL) 1.25 MG (50000 UNIT) PO CAPS: 50000.0000 [IU] | ORAL_CAPSULE | ORAL | 10 refills | Status: DC

## 2023-10-16 MED ORDER — SERTRALINE HCL 100 MG PO TABS
100.0000 mg | ORAL_TABLET | Freq: Two times a day (BID) | ORAL | 1 refills | Status: DC
Start: 2023-10-16 — End: 2023-11-15

## 2023-10-16 NOTE — Progress Notes (Addendum)
 Established Patient Office Visit  Subjective:  Patient ID: Kathryn Oconnell, female    DOB: 1965-10-11  Age: 58 y.o. MRN: [id number]  Chief Complaint  Patient presents with   Hospitalization Follow-up    Hospital Follow Up    Patient here today for a [facility] hospital follow up from 10/04/23.  She sees Endocrinology later this week.  She also sees [facility] for ADHD.  She needs refills on her sertraline  and high dose vitamin D .      No other concerns at this time.   Past Medical History:  Diagnosis Date   Anxiety    ASCUS of cervix with negative high risk HPV 05/14/2020   05/2020 ASCUS negative HPV, repeat pap in 3 years with HPV, 5 year CIN3+ risk is 0.27% per ASCCP guidelines    Chronic kidney disease    COPD (chronic obstructive pulmonary disease) (HCC)    Depression    Diabetes mellitus without complication (HCC)    Eczema    Graves disease    HSV-2 infection    Hyperlipidemia    Hypertension    Hypothyroid    Lichen planus    Lung nodule    Menopausal vaginal dryness 02/24/2014   Migraines    Neuropathy    Tobacco use    Trouble in sleeping 06/25/2015    Past Surgical History:  Procedure Laterality Date   CESAREAN SECTION     x 2   FOOT SURGERY     5th digit left foot / Great toe, 2ND TOE   THYROID  SURGERY      Social History   Socioeconomic History   Marital status: Widowed    Spouse name: Not on file   Number of children: Not on file   Years of education: Not on file   Highest education level: Not on file  Occupational History   Not on file  Tobacco Use   Smoking status: Every Day    Current packs/day: 0.50    Average packs/day: 0.5 packs/day for 35.0 years (17.5 ttl pk-yrs)    Types: Cigarettes   Smokeless tobacco: Never   Tobacco comments:    smokes 10 cig daily  Vaping Use   Vaping status: Former  Substance and Sexual Activity   Alcohol  use: No   Drug use: No   Sexual activity: Not Currently    Birth control/protection:  Post-menopausal  Other Topics Concern   Not on file  Social History Narrative   Not on file   Social Drivers of Health   Financial Resource Strain: High Risk (10/02/2023)   Received from [facility]   Overall Financial Resource Strain (CARDIA)    Difficulty of Paying Living Expenses: Very hard  Food Insecurity: No Food Insecurity (10/10/2023)   Hunger Vital Sign    Worried About Running Out of Food in the Last Year: Never true    Ran Out of Food in the Last Year: Never true  Transportation Needs: No Transportation Needs (10/10/2023)   PRAPARE - Administrator, Civil Service (Medical): No    Lack of Transportation (Non-Medical): No  Recent Concern: Transportation Needs - Unmet Transportation Needs (10/02/2023)   Received from [facility] - Transportation    In the past 12 months, has lack of transportation kept you from medical appointments or from getting medications?: Yes    Lack of Transportation (Non-Medical): No  Physical Activity: Sufficiently Active (02/26/2023)   Exercise Vital Sign  Days of Exercise per Week: 5 days    Minutes of Exercise per Session: 30 min  Stress: Stress Concern Present (02/26/2023)   [employer] of Occupational Health - Occupational Stress Questionnaire    Feeling of Stress : To some extent  Social Connections: Moderately Isolated (02/26/2023)   Social Connection and Isolation Panel    Frequency of Communication with Friends and Family: More than three times a week    Frequency of Social Gatherings with Friends and Family: More than three times a week    Attends Religious Services: 1 to 4 times per year    Active Member of [employer] or Organizations: No    Attends Banker Meetings: Patient declined    Marital Status: Widowed  Intimate Partner Violence: Not At Risk (10/10/2023)   Humiliation, Afraid, Rape, and Kick questionnaire    Fear of Current or Ex-Partner: No    Emotionally  Abused: No    Physically Abused: No    Sexually Abused: No    Family History  Problem Relation Age of Onset   Stroke Mother        x 3   Cancer Mother        breast    Thyroid  disease Mother    Hyperlipidemia Mother    Cancer Father        stomach cancer    Hypertension Father    Heart attack Father 20   Gout Brother    Migraines Son    Heart attack Son    Hypertension Paternal Uncle    Heart disease Maternal Grandmother    Heart attack Maternal Grandmother    Heart disease Maternal Grandfather    Heart attack Maternal Grandfather    Cancer Paternal Grandmother    Diabetes Paternal Grandfather    Asthma Daughter     Allergies  Allergen Reactions   Erythromycin Other (See Comments)    Stomach cramps   Imitrex [Sumatriptan] Other (See Comments)    Migraine and increased BP   Sulfa Antibiotics Other (See Comments)    Causes stomach cramps   Topamax [Topiramate] Nausea And Vomiting    Nausea and worsening    Outpatient Medications Prior to Visit  Medication Sig   aspirin 81 MG tablet Take 81 mg by mouth daily.   atorvastatin  (LIPITOR) 80 MG tablet TAKE 1 TABLET BY MOUTH EVERY DAY   Azelaic Acid  15 % gel APPLY TO AFFECTED AREAS FACE EVERY MORNING.   BAQSIMI TWO PACK 3 MG/DOSE POWD Place into both nostrils. (Patient not taking: Reported on 10/10/2023)   brexpiprazole  (REXULTI ) 1 MG TABS tablet Take 1 tablet (1 mg total) by mouth daily. (Patient not taking: Reported on 10/10/2023)   clobetasol  (TEMOVATE ) 0.05 % external solution Apply 1 application topically 2 (two) times daily. To affected areas at the scalp. Avoid applying to face, groin, and axilla. (Patient not taking: Reported on 10/10/2023)   clobetasol  ointment (TEMOVATE ) 0.05 % Apply twice daily to affected areas on elbows, avoid face, groin, axilla (Patient not taking: Reported on 10/10/2023)   Continuous Blood Gluc Sensor (DEXCOM G6 SENSOR) MISC 1 Device by Does not apply route as directed.   Continuous Blood Gluc  Transmit (DEXCOM G6 TRANSMITTER) MISC 1 Device by Does not apply route as directed.   Crisaborole  (EUCRISA ) 2 % OINT Apply to eyelids twice daily until improved. (Patient not taking: Reported on 10/10/2023)   cyanocobalamin  (VITAMIN B12) 1000 MCG tablet Take 1,000 mcg by mouth.   cyclobenzaprine  (FLEXERIL ) 10 MG  tablet Take 1 tablet (10 mg total) by mouth 3 (three) times daily as needed. (Patient not taking: Reported on 10/10/2023)   doxycycline  (MONODOX ) 100 MG capsule Take 1 capsule (100 mg total) by mouth 2 (two) times daily. Take with food (Patient not taking: Reported on 10/10/2023)   fluconazole  (DIFLUCAN ) 150 MG tablet TAKE 1 TABLET NOW AND 1 TABLET IN 3 DAYS (Patient not taking: Reported on 10/10/2023)   glucose blood test strip 1 each by Other route 4 (four) times daily. Use as instructed to check blood sugar four times daily.   halobetasol  (ULTRAVATE ) 0.05 % ointment Apply topically 2 (two) times daily. (Patient not taking: Reported on 10/10/2023)   HYDROQUINONE-HC-TRETINOIN  EX Apply 1 Application topically at bedtime.   insulin  aspart (NOVOLOG ) 100 UNIT/ML FlexPen Inject 5-11 Units into the skin 3 (three) times daily before meals. Use up to 50U QD SQ per insulin  pump (Patient not taking: Reported on 10/10/2023)   insulin  glargine (LANTUS  SOLOSTAR) 100 UNIT/ML Solostar Pen Inject 15 Units into the skin daily. (Patient taking differently: Inject 15 Units into the skin daily.)   Insulin  Pen Needle (BD PEN NEEDLE MICRO U/F) 32G X 6 MM MISC USE AS DIRECTED   ipratropium (ATROVENT ) 0.06 % nasal spray PLACE 2 SPRAYS INTO THE NOSE 3 (THREE) TIMES DAILY.   JARDIANCE 10 MG TABS tablet Take 10 mg by mouth daily. (Patient not taking: Reported on 10/10/2023)   levothyroxine  (SYNTHROID ) 100 MCG tablet Take 1 tablet (100 mcg total) by mouth daily before breakfast. (Patient taking differently: Take 1 tablet (100 mcg total) by mouth daily before breakfast.)   lidocaine (LIDODERM) 5 % SMARTSIG:1-3 Patch(s) Topical  (Patient not taking: Reported on 10/10/2023)   LINZESS  145 MCG CAPS capsule Take 1 capsule (145 mcg total) by mouth daily. (Patient not taking: Reported on 10/10/2023)   loratadine  (CLARITIN ) 10 MG tablet Take 10 mg by mouth daily.   LORazepam  (ATIVAN ) 1 MG tablet Take 1 tablet (1 mg total) by mouth 3 (three) times daily as needed for anxiety.   medroxyPROGESTERone  (PROVERA ) 2.5 MG tablet TAKE 1 TABLET BY MOUTH ONCE DAILY. APPOINTMENT REQUIRED FOR FUTURE REFILLS (Patient not taking: Reported on 10/10/2023)   metoprolol  tartrate (LOPRESSOR ) 25 MG tablet Take 1 tablet (25 mg total) by mouth 2 (two) times daily.   metroNIDAZOLE  (METROCREAM ) 0.75 % cream Apply topically 2 (two) times daily. To face (Patient not taking: Reported on 10/10/2023)   minoxidil  (LONITEN ) 2.5 MG tablet Take 1 tablet (2.5 mg total) by mouth daily.   MOUNJARO 2.5 MG/0.5ML Pen SMARTSIG:2.5 Milligram(s) SUB-Q Once a Week (Patient not taking: Reported on 10/10/2023)   mupirocin  ointment (BACTROBAN ) 2 % Apply topically daily. (Patient not taking: Reported on 10/10/2023)   omeprazole (PRILOSEC) 40 MG capsule Take 40 mg by mouth every morning.   Oxycodone  HCl 10 MG TABS Take 10 mg by mouth 4 (four) times daily as needed.   pregabalin  (LYRICA ) 25 MG capsule Take 25 mg by mouth 4 (four) times daily. (Patient taking differently: Take 25 mg by mouth 4 (four) times daily.)   promethazine  (PHENERGAN ) 25 MG tablet Take 1 tablet (25 mg total) by mouth as needed for nausea or vomiting. (Patient not taking: Reported on 10/10/2023)   Ruxolitinib Phosphate  (OPZELURA ) 1.5 % CREA Apply twice daily to affected areas as needed   Semaglutide , 1 MG/DOSE, 4 MG/3ML SOPN Inject 1 mg into the skin once a week. (Patient not taking: Reported on 10/10/2023)   sodium bicarbonate 650 MG tablet Take 650  mg by mouth 2 (two) times daily.   sucralfate (CARAFATE) 1 g tablet Take 1 g by mouth 3 (three) times daily. (Patient not taking: Reported on 10/10/2023)   tacrolimus  (PROTOPIC )  0.1 % ointment Apply topically 2 (two) times daily. (Patient not taking: Reported on 10/10/2023)   triamcinolone  (KENALOG ) 0.025 % cream APPLY TOPICALLY TWICE DAILY FOR 1 WEEK TO DARK SPOTS AT FACE. (Patient not taking: Reported on 10/10/2023)   valACYclovir  (VALTREX ) 1000 MG tablet Take 1 tablet (1,000 mg total) by mouth 2 (two) times daily. X 7 days   Vitamin D , Ergocalciferol , (DRISDOL ) 1.25 MG (50000 UNIT) CAPS capsule TAKE 1 CAPSULE (50,000 UNITS TOTAL) BY MOUTH EVERY 7 (SEVEN) DAYS (Patient not taking: Reported on 10/10/2023)   zolpidem  (AMBIEN  CR) 12.5 MG CR tablet Take 1 tablet (12.5 mg total) by mouth at bedtime. (Patient not taking: Reported on 10/10/2023)   [DISCONTINUED] Sertraline  HCl 200 MG CAPS Take 1 capsule by mouth every morning. (Patient taking differently: Take 1 capsule by mouth every morning.)   No facility-administered medications prior to visit.    ROS     Objective:   BP 104/62   Pulse 88   Ht 5' 4 (1.626 m)   Wt 140 lb 12.8 oz (63.9 kg)   LMP 06/18/2013   SpO2 96%   BMI 24.17 kg/m   Vitals:   10/16/23 1340  BP: 104/62  Pulse: 88  Height: 5' 4 (1.626 m)  Weight: 140 lb 12.8 oz (63.9 kg)  SpO2: 96%  BMI (Calculated): 24.16    Physical Exam Vitals and nursing note reviewed.  Constitutional:      Appearance: Normal appearance.  HENT:     Head: Normocephalic.  Eyes:     Pupils: Pupils are equal, round, and reactive to light.  Cardiovascular:     Rate and Rhythm: Normal rate and regular rhythm.  Pulmonary:     Effort: Pulmonary effort is normal.     Breath sounds: Normal breath sounds.  Abdominal:     Palpations: Abdomen is soft.  Musculoskeletal:        General: Normal range of motion.     Cervical back: Normal range of motion and neck supple.  Skin:    General: Skin is warm and dry.  Neurological:     Mental Status: She is alert and oriented to person, place, and time.  Psychiatric:        Mood and Affect: Mood normal.        Behavior:  Behavior normal.      No results found for any visits on 10/16/23.  No results found for this or any previous visit (from the past 2160 hours).    Assessment & Plan: 1) vit D deficiency- needs refill 2) HTN 3) Chronic bronchitis 4) tobacco use - discussed tobacco cessation 5) Anxiety - refill on sertraline      Problem List Items Addressed This Visit   None Visit Diagnoses       Vitamin D  deficiency           No follow-ups on file.   Total time spent: 30 minutes  Neale Carpen, NP  10/16/2023   This document may have been prepared by [facility] Voice Recognition software and as such may include unintentional dictation errors.

## 2023-10-16 NOTE — Patient Instructions (Signed)
 1) Refill sertraline  200 mg dailly 2) Refill high dose vit D weekly 3) Follow up appt in 3 months, will have fasting labs at next appt

## 2023-10-17 ENCOUNTER — Telehealth: Payer: Self-pay

## 2023-10-18 ENCOUNTER — Ambulatory Visit: Payer: Self-pay

## 2023-10-18 ENCOUNTER — Other Ambulatory Visit: Payer: Self-pay

## 2023-10-18 NOTE — Telephone Encounter (Signed)
 FYI Only or Action Required?: FYI only for provider.  Patient was last seen in primary care on 10/16/2023 by Glennon Sand, NP.  Called Nurse Triage reporting Leg Swelling, hand swelling, and Facial Swelling.  Symptoms began today.  Interventions attempted: Nothing.  Symptoms are: gradually worsening.  Triage Disposition: See Physician Within 24 Hours- Patient to ED  Patient/caregiver understands and will follow disposition?: Yes   Copied from CRM [id number]. Topic: Clinical - Red Word Triage >> Oct 18, 2023  9:54 AM Winona R wrote: Pt woke up with swelling in face, hands legs and feet. Reason for Disposition  Swelling of face, arm or hands  (Exception: Slight puffiness of fingers occurring during hot weather.)  Answer Assessment - Initial Assessment Questions 1. ONSET: When did the swelling start? (e.g., minutes, hours, days)     One day ago 2. LOCATION: What part of the leg is swollen?  Are both legs swollen or just one leg?     Bilateral feet, bilateral hands, face, gums 3. SEVERITY: How bad is the swelling? (e.g., localized; mild, moderate, severe)     Mild swelling 4. REDNESS: Is there redness or signs of infection?     denies 5. PAIN: Is the swelling painful to touch? If Yes, ask: How painful is it?   (Scale 1-10; mild, moderate or severe)     denies 6. FEVER: Do you have a fever? If Yes, ask: What is it, how was it measured, and when did it start?      denies 7. CAUSE: What do you think is causing the leg swelling?     unknown 8. MEDICAL HISTORY: Do you have a history of blood clots (e.g., DVT), cancer, heart failure, kidney disease, or liver failure?     Kidney disease CKD stage 3 9. RECURRENT SYMPTOM: Have you had leg swelling before? If Yes, ask: When was the last time? What happened that time?     Oral Diuretic 10. OTHER SYMPTOMS: Do you have any other symptoms? (e.g., chest pain, difficulty breathing)       denies  Protocols used:  Leg Swelling and Edema-A-AH

## 2023-10-18 NOTE — Patient Instructions (Signed)
 Visit Information  Thank you for taking time to visit with me today. Please don't hesitate to contact me if I can be of assistance to you.   Following is a copy of your care plan:   Goals Addressed             This Visit's Progress    [facility] Transitions of Care (TOC) Care Plan       Problems:  Recent Hospitalization for treatment of DM type 1 and DKA Uncontrolled DM       Patient having some swelling to face, hands, legs and feet. She called her PCP and could not be seen.  Patient advised to seek treatment.  Patient heading to [facility] for evaluation.  Goal:  Over the next 30 days, the patient will not experience hospital readmission  Interventions:  Transitions of Care: Doctor Visits  - discussed the importance of doctor visits Reviewed Signs and symptoms of infection Reviewed recent labs and HgBA1c of 12.1 on 10/01/2023  Diabetes Interventions: Assessed patient's understanding of A1c goal: <7% Provided education to patient about basic DM disease process Reviewed medications with patient and discussed importance of medication adherence Counseled on importance of regular laboratory monitoring as prescribed Discussed plans with patient for ongoing care management follow up and provided patient with direct contact information for care management team Provided patient with verbal education related to hypo and hyperglycemia and importance of correct treatment Encouraged patient to be observant for how often she has hypoglcemia. Encouraged patient to eat regular meals.  Reviewed importance of protein and limiting bread, pasta , rice and potatoes. Reviewed scheduled/upcoming provider appointments including: PCP and endocrinologist Advised patient, providing education and rationale, to check cbg as prescribed and record, calling MD for findings outside established parameters Review of patient status, including review of consultants reports, relevant laboratory and other test results, and  medications completed Screening for signs and symptoms of depression related to chronic disease state  Assessed social determinant of health barriers Reviewed damage caused by uncontrolled Dm Encouraged patient to follow DM diet Reviewed that patient has had DM education and a nutritionist in the past Reviewed that patient is using her insulin  pump and is given herself a bolus with meals. Reviewed that patient uses her dexacom for continuous Glucose monitoring.  Referral place for pharmacy  Lab Results  Component Value Date   HGBA1C 7.4 04/04/2022  A1C is 12.1 on 10/01/2023 at [facility]  Patient Self Care Activities:  Attend all scheduled provider appointments Call pharmacy for medication refills 3-7 days in advance of running out of medications Call provider office for new concerns or questions  Notify RN Care Manager of TOC call rescheduling needs Participate in Transition of Care Program/Attend TOC scheduled calls Perform all self care activities independently  Perform IADL's (shopping, preparing meals, housekeeping, managing finances) independently Take medications as prescribed   Work with the pharmacist to address medication management needs and will continue to work with the clinical team to address health care and disease management related needs check blood sugar at prescribed times: as suggested by MD and for any high or low readings. check feet daily for cuts, sores or redness enter blood sugar readings and medication or insulin  into daily log take the blood sugar log to all doctor visits trim toenails straight across drink 6 to 8 glasses of water each day fill half of plate with vegetables limit fast food meals to no more than 1 per week wash and dry feet carefully every day wear  comfortable, cotton socks wear comfortable, well-fitting shoes See your DM specialist -patient missed appointment today, states she rescheduled appointment  Plan:  RN CM follow up as appropriate  pending ER evaluation Referral to pharmacy for DM management.  Provided my contact information for patient to call if needed.         Patient verbalizes understanding of instructions and care plan provided today and agrees to view in MyChart. Active MyChart status and patient understanding of how to access instructions and care plan via MyChart confirmed with patient.     The patient has been provided with contact information for the care management team and has been advised to call with any health related questions or concerns.   Please call the care guide team at [phone] if you need to cancel or reschedule your appointment.   Please call the Suicide and Crisis Lifeline: 988 if you are experiencing a Mental Health or Behavioral Health Crisis or need someone to talk to.  Keymari Sato J. Kadejah Sandiford RN, MSN [facility], [facility] RN Care Manager Direct Dial: [phone]  Fax: [phone] Website: [email or web address]

## 2023-10-18 NOTE — Transitions of Care (Post Inpatient/ED Visit) (Signed)
 Transition of Care week 2  Visit Note  10/18/2023  Name: Kathryn Oconnell MRN: [id number]          DOB: [birth year]  Situation: Patient enrolled in [facility] 30-day program. Visit completed with patient by telephone. She states that she is headed to [facility] for evaluation as she has some swelling to face, hands, feet and legs. She did call PCP but could not be seen per patient.  Advised that someone would follow up pending her status.  She verbalized understanding.    Background:   Initial Transition Care Management Follow-up Telephone Call    Past Medical History:  Diagnosis Date   Anxiety    ASCUS of cervix with negative high risk HPV 05/14/2020   05/2020 ASCUS negative HPV, repeat pap in 3 years with HPV, 5 year CIN3+ risk is 0.27% per ASCCP guidelines    Chronic kidney disease    COPD (chronic obstructive pulmonary disease) (HCC)    Depression    Diabetes mellitus without complication (HCC)    Eczema    Graves disease    HSV-2 infection    Hyperlipidemia    Hypertension    Hypothyroid    Lichen planus    Lung nodule    Menopausal vaginal dryness 02/24/2014   Migraines    Neuropathy    Tobacco use    Trouble in sleeping 06/25/2015    Assessment: Did not complete assessment fully as patient headed to hospital for evaluation Patient Reported Symptoms: Cognitive Cognitive Status: Alert and oriented to person, place, and time, Normal speech and language skills      Neurological Neurological Review of Symptoms: Not assessed    HEENT        Cardiovascular Cardiovascular Symptoms Reported: Swelling in legs or feet (Patient reports some swelling to hands ,legs, feet and face.  She states she is headed to [facility])    Respiratory      Endocrine Endocrine Symptoms Reported: Unintentional weight gain Other symptoms related to hypoglycemia or hyperglycemia: Patient reports that her insulin  pump just ran out of medication.  She is changing her medication out for a new one Is  patient diabetic?: Yes    Gastrointestinal        Genitourinary      Integumentary      Musculoskeletal          Psychosocial           There were no vitals filed for this visit.  Medications Reviewed Today     Reviewed by Edelin Fryer, RN (Case Manager) on 10/18/23 at 1118  Med List Status: <None>   Medication Order Taking? Sig Documenting Provider Last Dose Status Informant  aspirin 81 MG tablet [id number] Yes Take 81 mg by mouth daily. [provider]  Active   atorvastatin  (LIPITOR) 80 MG tablet [id number] Yes TAKE 1 TABLET BY MOUTH EVERY DAY Zarwolo, Gloria, FNP  Active   Azelaic Acid  15 % gel [id number] Yes APPLY TO AFFECTED AREAS FACE EVERY MORNING. Jackquline Sawyer, MD  Active   BAQSIMI TWO PACK 3 MG/DOSE POWD [id number]  Place into both nostrils.  Patient not taking: Reported on 10/18/2023   [provider]  Active   brexpiprazole  (REXULTI ) 1 MG TABS tablet [id number]  Take 1 tablet (1 mg total) by mouth daily.  Patient not taking: Reported on 10/18/2023   Golda Lynwood PARAS, MD  Active   clobetasol  (TEMOVATE ) 0.05 % external solution [id number]  Apply 1 application topically 2 (two) times daily.  To affected areas at the scalp. Avoid applying to face, groin, and axilla.  Patient not taking: Reported on 10/18/2023   Moye, [place] , MD  Active   clobetasol  ointment (TEMOVATE ) 0.05 % [id number]  Apply twice daily to affected areas on elbows, avoid face, groin, axilla  Patient not taking: Reported on 10/18/2023   Jackquline Sawyer, MD  Active   Continuous Blood Gluc Sensor (DEXCOM G6 SENSOR) MISC [id number] Yes 1 Device by Does not apply route as directed. de [place], Raymond J, MD  Active   Continuous Blood Gluc Transmit (DEXCOM G6 TRANSMITTER) MISC [id number] Yes 1 Device by Does not apply route as directed. de [place], Raymond J, MD  Active   Crisaborole  (EUCRISA ) 2 % MARCH [id number]  Apply to eyelids twice daily until improved.  Patient not taking: Reported on 10/18/2023    Jackquline Sawyer, MD  Active   cyanocobalamin  (VITAMIN B12) 1000 MCG tablet [id number] Yes Take 1,000 mcg by mouth. [provider]  Active   cyclobenzaprine  (FLEXERIL ) 10 MG tablet [id number]  Take 1 tablet (10 mg total) by mouth 3 (three) times daily as needed.  Patient not taking: Reported on 10/18/2023   de [place], Quintin PARAS, MD  Active   doxycycline  (MONODOX ) 100 MG capsule [id number]  Take 1 capsule (100 mg total) by mouth 2 (two) times daily. Take with food  Patient not taking: Reported on 10/18/2023   Claudene Lehmann, MD  Active   fluconazole  (DIFLUCAN ) 150 MG tablet [id number]  TAKE 1 TABLET NOW AND 1 TABLET IN 3 DAYS  Patient not taking: Reported on 10/18/2023   Signa Nest A, NP  Active   glucose blood test strip [id number] Yes 1 each by Other route 4 (four) times daily. Use as instructed to check blood sugar four times daily. [provider]  Active   halobetasol  (ULTRAVATE ) 0.05 % ointment [id number]  Apply topically 2 (two) times daily.  Patient not taking: Reported on 10/18/2023   [facility], Virginia , MD  Active   [facility]  EX [id number] Yes Apply 1 Application topically at bedtime. [provider]  Active   insulin  aspart (NOVOLOG ) 100 UNIT/ML FlexPen [id number]  Inject 5-11 Units into the skin 3 (three) times daily before meals. Use up to 50U QD SQ per insulin  pump  Patient not taking: Reported on 10/18/2023   de [place], Quintin PARAS, MD  Active   insulin  glargine (LANTUS  SOLOSTAR) 100 UNIT/ML Solostar Pen [id number] Yes Inject 15 Units into the skin daily.  Patient taking differently: Inject 15 Units into the skin daily.   de [place], Raymond J, MD  Active   Insulin  Pen Needle (BD PEN NEEDLE MICRO U/F) 32G X 6 MM MISC [id number] Yes USE AS DIRECTED de [place], Raymond J, MD  Active   ipratropium (ATROVENT ) 0.06 % nasal spray [id number] Yes PLACE 2 SPRAYS INTO THE NOSE 3 (THREE) TIMES DAILY. de [place], Raymond J, MD  Active   JARDIANCE 10 MG TABS tablet  [id number]  Take 10 mg by mouth daily.  Patient not taking: Reported on 10/18/2023   [provider]  Active   levothyroxine  (SYNTHROID ) 100 MCG tablet [id number] Yes Take 1 tablet (100 mcg total) by mouth daily before breakfast.  Patient taking differently: Take 1 tablet (100 mcg total) by mouth daily before breakfast.   de [place], Raymond J, MD  Active   lidocaine (LIDODERM) 5 % [id number] Yes SMARTSIG:1-3 Patch(s) Topical [provider]  Active   LINZESS  145 MCG CAPS capsule [id number]  Take 1 capsule (  145 mcg total) by mouth daily.  Patient not taking: Reported on 10/18/2023   [place], Quintin PARAS, MD  Active   loratadine  (CLARITIN ) 10 MG tablet [id number] Yes Take 10 mg by mouth daily. [provider]  Active   LORazepam  (ATIVAN ) 1 MG tablet [id number] Yes Take 1 tablet (1 mg total) by mouth 3 (three) times daily as needed for anxiety. Zarwolo, Gloria, FNP  Active   medroxyPROGESTERone  (PROVERA ) 2.5 MG tablet [id number]  TAKE 1 TABLET BY MOUTH ONCE DAILY. APPOINTMENT REQUIRED FOR FUTURE REFILLS  Patient not taking: Reported on 10/18/2023   Signa Nest A, NP  Active   metoprolol  tartrate (LOPRESSOR ) 25 MG tablet [id number] Yes Take 1 tablet (25 mg total) by mouth 2 (two) times daily. [place], Raymond J, MD  Active   metroNIDAZOLE  (METROCREAM ) 0.75 % cream [id number]  Apply topically 2 (two) times daily. To face  Patient not taking: Reported on 10/18/2023   Claudene Lehmann, MD  Active   minoxidil  (LONITEN ) 2.5 MG tablet [id number]  Take 1 tablet (2.5 mg total) by mouth daily. Claudene Lehmann, MD  Active   MOUNJARO 2.5 MG/0.5ML Pen [id number]  SMARTSIG:2.5 Milligram(s) SUB-Q Once a Week  Patient not taking: Reported on 10/18/2023   [provider]  Active   mupirocin  ointment (BACTROBAN ) 2 % [id number]  Apply topically daily.  Patient not taking: Reported on 10/18/2023   Moye, Virginia , MD  Active   omeprazole (PRILOSEC) 40 MG capsule [id number] Yes Take 40 mg  by mouth every morning. [provider]  Active   Oxycodone  HCl 10 MG TABS [id number] Yes Take 10 mg by mouth 4 (four) times daily as needed. [provider]  Active   pregabalin  (LYRICA ) 25 MG capsule [id number] Yes Take 25 mg by mouth 4 (four) times daily.  Patient taking differently: Take 25 mg by mouth 4 (four) times daily.   [provider]  Active   promethazine  (PHENERGAN ) 25 MG tablet [id number]  Take 1 tablet (25 mg total) by mouth as needed for nausea or vomiting.  Patient not taking: Reported on 10/18/2023   Zarwolo, Gloria, FNP  Active   Ruxolitinib Phosphate  (OPZELURA ) 1.5 % CREA [id number] Yes Apply twice daily to affected areas as needed Claudene Lehmann, MD  Active   Semaglutide , 1 MG/DOSE, 4 MG/3ML SOPN [id number]  Inject 1 mg into the skin once a week.  Patient not taking: Reported on 10/18/2023   [place], Quintin PARAS, MD  Active   sertraline  (ZOLOFT ) 100 MG tablet [id number] Yes Take 1 tablet (100 mg total) by mouth 2 (two) times daily. Glennon Sand, NP  Active   sodium bicarbonate 650 MG tablet [id number] Yes Take 650 mg by mouth 2 (two) times daily. [provider]  Active   sucralfate (CARAFATE) 1 g tablet [id number]  Take 1 g by mouth 3 (three) times daily.  Patient not taking: Reported on 10/18/2023   [provider]  Active   tacrolimus  (PROTOPIC ) 0.1 % ointment [id number]  Apply topically 2 (two) times daily.  Patient not taking: Reported on 10/18/2023   Jackquline Sawyer, MD  Active   triamcinolone  (KENALOG ) 0.025 % cream [id number]  APPLY TOPICALLY TWICE DAILY FOR 1 WEEK TO DARK SPOTS AT FACE.  Patient not taking: Reported on 10/18/2023   Moye, Virginia , MD  Active   valACYclovir  (VALTREX ) 1000 MG tablet [id number] Yes Take 1 tablet (1,000 mg total) by mouth 2 (two) times daily. X 7 days Signa Nest LABOR, NP  Active  Vitamin D , Ergocalciferol , (DRISDOL ) 1.25 MG (50000 UNIT) CAPS capsule [id number] Yes Take 1 capsule (50,000 Units  total) by mouth every 7 (seven) days. Glennon Sand, NP  Active   zolpidem  (AMBIEN  CR) 12.5 MG CR tablet [id number]  Take 1 tablet (12.5 mg total) by mouth at bedtime.  Patient not taking: Reported on 10/18/2023   Zarwolo, Gloria, FNP  Active             Recommendation:   Go to ER for evaluation as advised  Follow Up Plan:   RN CM will follow up as appropriate.  Jamaree Hosier J. Jiovanni Heeter RN, MSN [facility], [facility] RN Care Manager Direct Dial: [phone]  Fax: [phone] Website: [email or web address]

## 2023-10-22 ENCOUNTER — Other Ambulatory Visit: Payer: Self-pay

## 2023-10-22 ENCOUNTER — Telehealth: Payer: Self-pay

## 2023-10-22 NOTE — Progress Notes (Signed)
   10/22/2023  Patient ID: Kathryn Oconnell, female   DOB: May 21, 1965, 58 y.o.   MRN: [id number]  Attempted to contact patient for scheduled appointment for medication management. Left HIPAA compliant message for patient to return my call at their convenience.   Lang Sieve, PharmD, BCGP Clinical Pharmacist  [phone]

## 2023-10-22 NOTE — Telephone Encounter (Signed)
 Alternative Requested:INSURANCE PREFERS HUMALOG . Please assist.

## 2023-11-01 ENCOUNTER — Telehealth: Payer: Self-pay

## 2023-11-01 NOTE — Telephone Encounter (Signed)
 Patient called about a recent break out. Patient states she got really dry and her skin started to peel. After peeling and the skin healing she has been left with dark spots. She does not know if this is a lichen planus flare. She is going to send mychart photos for Dr. Jackquline to look at and determine if she needs treatment before her follow up on August 13th. She also states her vaginal area broke out with the same type of symptom. aw

## 2023-11-02 ENCOUNTER — Telehealth: Payer: Self-pay

## 2023-11-02 ENCOUNTER — Other Ambulatory Visit: Payer: Self-pay

## 2023-11-02 NOTE — Progress Notes (Signed)
   11/02/2023  Patient ID: Kathryn Oconnell, female   DOB: 13-May-1965, 58 y.o.   MRN: [id number]  Attempted to contact patient for scheduled appointment for medication management. Left HIPAA compliant message for patient to return my call at their convenience.   Lang Sieve, PharmD, BCGP Clinical Pharmacist  [phone]

## 2023-11-07 ENCOUNTER — Telehealth: Payer: Self-pay

## 2023-11-07 NOTE — Progress Notes (Unsigned)
 Complex Care Management Care Guide Note  11/07/2023 Name: Kathryn Oconnell MRN: [id number] DOB: 12-Oct-1965  Kathryn Oconnell is a 58 y.o. year old female who is a primary care patient of Zarwolo, Gloria, FNP and is actively engaged with the care management team. I reached out to Randine GORMAN Heller by phone today to assist with re-scheduling  with the Pharmacist.  Follow up plan: Unsuccessful telephone outreach attempt made. A HIPAA compliant phone message was left for the patient providing contact information and requesting a return call.  Jeoffrey Buffalo , RMA     [facility]  [facility] Guide  Direct Dial: [phone]  Website: [email or web address]

## 2023-11-08 NOTE — Progress Notes (Signed)
 Complex Care Management Care Guide Note  11/08/2023 Name: Kathryn Oconnell MRN: [id number] DOB: 1965/04/16  Kathryn Oconnell is a 58 y.o. year old female who is a primary care patient of Zarwolo, Gloria, FNP and is actively engaged with the care management team. I reached out to Randine GORMAN Heller by phone today to assist with re-scheduling  with the Pharmacist.  Follow up plan: Telephone appointment with complex care management team member scheduled for:  11/09/2023  Jeoffrey Buffalo , RMA     [place]  [facility], [facility] Guide  Direct Dial: [phone]  Website: [email or web address]

## 2023-11-09 ENCOUNTER — Other Ambulatory Visit: Payer: Self-pay

## 2023-11-09 ENCOUNTER — Other Ambulatory Visit: Payer: Self-pay | Admitting: Family Medicine

## 2023-11-09 ENCOUNTER — Telehealth: Payer: Self-pay

## 2023-11-09 DIAGNOSIS — E039 Hypothyroidism, unspecified: Secondary | ICD-10-CM

## 2023-11-09 DIAGNOSIS — E782 Mixed hyperlipidemia: Secondary | ICD-10-CM

## 2023-11-09 NOTE — Progress Notes (Signed)
   11/09/2023  Patient ID: Kathryn Oconnell, female   DOB: 1965-11-30, 58 y.o.   MRN: [id number]  Call dropped - pt returned call. Visit completed.

## 2023-11-09 NOTE — Progress Notes (Signed)
   11/09/2023  Patient ID: Kathryn Oconnell, female   DOB: Aug 17, 1965, 58 y.o.   MRN: [id number]  Performed extensive review of all medications, including those reported from recent visits, claims, and patient report. Majority of visit spent on reconciling this to ensure we were working off of the most up to date list.   Also discussed the possibility of switching to pharmacy that does pill packaging   Future Appointments  Date Time Provider Department Center  11/14/2023  9:45 AM Jackquline Sawyer, MD [facility] None  01/17/2024  1:40 PM Glennon Sand, NP [facility]

## 2023-11-14 ENCOUNTER — Ambulatory Visit: Admitting: Dermatology

## 2023-11-14 DIAGNOSIS — L661 Lichen planopilaris, unspecified: Secondary | ICD-10-CM | POA: Diagnosis not present

## 2023-11-14 DIAGNOSIS — L309 Dermatitis, unspecified: Secondary | ICD-10-CM

## 2023-11-14 DIAGNOSIS — L719 Rosacea, unspecified: Secondary | ICD-10-CM | POA: Diagnosis not present

## 2023-11-14 DIAGNOSIS — L859 Epidermal thickening, unspecified: Secondary | ICD-10-CM | POA: Diagnosis not present

## 2023-11-14 MED ORDER — MINOXIDIL 2.5 MG PO TABS: 2.5000 mg | ORAL_TABLET | Freq: Every day | ORAL | 1 refills | Status: DC

## 2023-11-14 NOTE — Progress Notes (Signed)
 Follow-Up Visit   Subjective  Kathryn Oconnell is a 58 y.o. 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 Scalp is improving with some hair regrowth. She is using  Hydroquinone: 8%, Tretinoin: 0.1%, Kojic Acid: 1%, Niacinamide: 4%, Fluocinolone: 0.025% Cream and takes minoxidil 2.5 mg tablet daily. Rosacea vs Dermatitis flares off and on. She is using azelaic acid 15% gel. She still has dark spots on her face- was improving, but got new dark spots recently; not outside a lot, works 3rd shift, minimal sun exposure. She also has new dark scaly spots on her feet x few weeks she would like checked.   The following portions of the chart were reviewed this encounter and updated as appropriate: medications, allergies, medical history  Review of Systems:  No other skin or systemic complaints except as noted in HPI or Assessment and Plan.  Objective  Well appearing patient in no apparent distress; mood and affect are within normal limits.  A focused examination was performed of the following areas: Face, feet, scalp  Relevant physical exam findings are noted in the Assessment and Plan.    Assessment & Plan  LICHEN PLANOPILARIS/lichen planus pigmentosus  Exam: Scalp with some hair regrowth, no obvious patches of alopecia, hyperpigmented patches cheeks, forehead, linear hyperpigmented patches on temples. Pt has Dermablend make-up on at today's exam.  Chronic and persistent condition with duration or expected duration over one year. Condition is symptomatic/ bothersome to patient. Not currently at goal.      Treatment Plan: BP 149/85 Stop using Hydroquinone: 8%, Tretinoin: 0.1%, Kojic Acid: 1%, Niacinamide: 4%, Fluocinolone: 0.025% Cream (pt has been using for 4 months) for a break off of HQ. May restart in 2 months.  Spf 30+ sunscreen qAM Start Tacrolimus 0.1% ointment every day, pt will use Good Rx coupon   Continue minoxidil 2.5  mg daily, tolerating with no side effects, has some hair growth. Refills sent in.    Doses of oral minoxidil for hair loss are considered 'low dose'. This is because the doses used for hair loss are much lower than the doses which are used for conditions such as high blood pressure (hypertension). The doses used for hypertension are 10-40mg  per day.  Side effects are uncommon at the low doses (up to 2.5 mg/day) used to treat hair loss. Potential side effects, more commonly seen at higher doses, include: Increase in hair growth (hypertrichosis) elsewhere on face and body Temporary hair shedding upon starting medication which may last up to 4 weeks Ankle swelling, fluid retention, rapid weight gain more than 5 pounds Low blood pressure and feeling lightheaded or dizzy when standing up quickly Fast or irregular heartbeat Headaches   Topical retinoid medications like tretinoin/Retin-A, adapalene/Differin, tazarotene/Fabior, and Epiduo/Epiduo Forte can cause dryness and irritation when first started. Only apply a pea-sized amount to the entire affected area. Avoid applying it around the eyes, edges of mouth and creases at the nose. If you experience irritation, use a good moisturizer first and/or apply the medicine less often. If you are doing well with the medicine, you can increase how often you use it until you are applying every night. Be careful with sun protection while using this medication as it can make you sensitive to the sun. This medicine should not be used by pregnant women.    LICHEN PLANOPILARIS   Related Medications minoxidil (LONITEN) 2.5 MG tablet Take 1 tablet (2.5 mg total) by mouth daily.  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 Condition is symptomatic/ bothersome to patient. Not currently at goal.   Metrocream and Doxy- caused side  effects. Opzelura did not help   Rosacea is a chronic progressive skin condition usually affecting the face of adults, causing redness and/or acne bumps. It is treatable but not curable. It sometimes affects the eyes (ocular rosacea) as well. It may respond to topical and/or systemic medication and can flare with stress, sun exposure, alcohol, exercise, topical steroids (including hydrocortisone/cortisone 10) and some foods.  Daily application of broad spectrum spf 30+ sunscreen to face is recommended to reduce flares.   Treatment: Continue azelaic acid 15% gel to aa face once to twice daily.  HYPERKERATOSIS Exam: Hyperkeratosis on the heels  Treatment: Recommend AmLactin foot cream twice a day. May try tacrolimus 0.1% ointment following AmLactin.    Return in about 4 months (around 03/15/2024) for LPP, Derm vs Rosacea. Pt is interested in Botox and is scheduled 10/1 at 11AM for consult and tx.  IAndrea Kerns, CMA, am acting as scribe for Rexene Rattler, MD .   Documentation: I have reviewed the above documentation for accuracy and completeness, and I agree with the above.  Rexene Rattler, MD

## 2023-11-14 NOTE — Patient Instructions (Signed)

## 2023-11-15 ENCOUNTER — Other Ambulatory Visit: Payer: Self-pay | Admitting: Family Medicine

## 2023-11-15 DIAGNOSIS — E559 Vitamin D deficiency, unspecified: Secondary | ICD-10-CM

## 2023-11-15 NOTE — Telephone Encounter (Unsigned)
 Copied from CRM #[id number]. Topic: Clinical - Medication Refill >> Nov 15, 2023  4:16 PM Abigail D wrote: Medication: Vitamin D , Ergocalciferol , (DRISDOL ) 1.25 MG (50000 UNIT) CAPS capsule sertraline  (ZOLOFT ) 100 MG tablet  Has the patient contacted their pharmacy? Yes, Contacted by [facility] (Agent: If no, request that the patient contact the pharmacy for the refill. If patient does not wish to contact the pharmacy document the reason why and proceed with request.) (Agent: If yes, when and what did the pharmacy advise?)  This is the patient's preferred pharmacy:  [facility] - [place], [place] - [address] [place] [address] Phone: [phone] Fax: [phone]  Is this the correct pharmacy for this prescription? Yes If no, delete pharmacy and type the correct one.   Has the prescription been filled recently? No  Is the patient out of the medication? Pharmacy unsure  Has the patient been seen for an appointment in the last year OR does the patient have an upcoming appointment? Yes  Can we respond through MyChart? Yes  Agent: Please be advised that Rx refills may take up to 3 business days. We ask that you follow-up with your pharmacy.

## 2023-11-19 ENCOUNTER — Telehealth: Payer: Self-pay

## 2023-11-19 NOTE — Telephone Encounter (Signed)
 Copied from CRM #[id number]. Topic: General - Other >> Nov 19, 2023  4:06 PM Chiquita SQUIBB wrote: Reason for CRM: Rosina from [place] is calling in regarding the fax they sent over on 08/12 for clearance for surgery on 08/22. Please advise [facility] an update on this or fax it back to [phone] a good call back number is [phone]

## 2023-11-20 ENCOUNTER — Telehealth: Payer: Self-pay

## 2023-11-20 MED ORDER — SERTRALINE HCL 100 MG PO TABS: 100.0000 mg | ORAL_TABLET | Freq: Two times a day (BID) | ORAL | 1 refills | Status: DC

## 2023-11-20 MED ORDER — VITAMIN D (ERGOCALCIFEROL) 1.25 MG (50000 UNIT) PO CAPS: 50000.0000 [IU] | ORAL_CAPSULE | ORAL | 10 refills | Status: AC

## 2023-11-20 NOTE — Telephone Encounter (Signed)
 Copied from CRM [id number]. Topic: Clinical - Medication Question >> Nov 20, 2023 12:44 PM Precious C wrote: Reason for CRM: Alexie from [employer] called regarding the patient's medication to check the status of the prescriptions that were supposed to be sent to their pharmacy. I informed her that per the notes in the patient's chart, the medication refill request was sent to the preferred pharmacy, which is [facility]. Alexie mentioned the prescriptions were sent on the 14th, which I confirmed. I also let her know that today, August 19, marks the third business day, and if she has not received anything by the end of the day or tomorrow, she stated that she will call back to check on the status again for the patient.

## 2023-11-20 NOTE — Telephone Encounter (Signed)
 No information given about medication or return number

## 2023-11-21 NOTE — Telephone Encounter (Signed)
 I cannot confirm the form was ever even received because nothing has been logged. There is nothing in the providers box nor in the copy folder

## 2023-11-21 NOTE — Telephone Encounter (Signed)
 [facility] calling again to check on medical clearance states they were told we received it but patient's surgery had to be canceled because they haven't received it back. Please advise Thanks

## 2023-11-21 NOTE — Telephone Encounter (Signed)
 Contacted Anasco from [place] and requested for form to be re-faxed. Admin: Please create an encounter for the form when received via fax and; Clinical: ensure timely turnaround time back to [place].

## 2023-11-22 ENCOUNTER — Telehealth: Payer: Self-pay

## 2023-11-22 NOTE — Telephone Encounter (Signed)
Faxed back to office

## 2023-11-22 NOTE — Telephone Encounter (Signed)
 Form given to chelsa for signing

## 2023-11-22 NOTE — Telephone Encounter (Signed)
 Copied from CRM #[id number]. Topic: General - Other >> Nov 22, 2023  8:35 AM Carlatta H wrote: Reason for CRM: Ashely from [facility] resent sent clearance for this morning and it needs to be faxed back by 2:30 or the patients appointment will be cancelled//

## 2023-11-22 NOTE — Telephone Encounter (Signed)
 Medical Clearance form Noted Copied Sleeved Original placed in provider box Copy placed front desk folder

## 2023-11-22 NOTE — Telephone Encounter (Signed)
 Faxed with confirmation.

## 2023-12-04 ENCOUNTER — Ambulatory Visit: Admitting: Adult Health

## 2023-12-04 ENCOUNTER — Encounter: Payer: Self-pay | Admitting: Adult Health

## 2023-12-04 VITALS — BP 114/75 | HR 83 | Ht 64.0 in | Wt 130.0 lb

## 2023-12-04 DIAGNOSIS — N9089 Other specified noninflammatory disorders of vulva and perineum: Secondary | ICD-10-CM

## 2023-12-04 MED ORDER — NYSTATIN 100000 UNIT/GM EX OINT: 1.0000 | TOPICAL_OINTMENT | Freq: Two times a day (BID) | CUTANEOUS | 0 refills | Status: DC

## 2023-12-04 MED ORDER — TRIAMCINOLONE ACETONIDE 0.5 % EX OINT: 1.0000 | TOPICAL_OINTMENT | Freq: Two times a day (BID) | CUTANEOUS | 0 refills | Status: DC

## 2023-12-04 NOTE — Progress Notes (Signed)
  Subjective:     Patient ID: Kathryn Oconnell, female   DOB: Dec 16, 1965, 58 y.o.   MRN: [id number]  HPI Jeremy is a 58 year old black female, widowed, PM in complaining of vulva irritation for about 3 weeks, has taken valtrex  and felt better maybe 1 day. Has been on prednisone .      Component Value Date/Time   DIAGPAP  02/26/2023 1634    - Negative for intraepithelial lesion or malignancy (NILM)   DIAGPAP (A) 05/04/2020 1451    - Atypical squamous cells of undetermined significance (ASC-US )   DIAGPAP  09/28/2016 0000    NEGATIVE FOR INTRAEPITHELIAL LESIONS OR MALIGNANCY.   DIAGPAP  09/28/2016 0000    FUNGAL ORGANISMS PRESENT CONSISTENT WITH CANDIDA SPP.   HPVHIGH Negative 02/26/2023 1634   HPVHIGH Negative 05/04/2020 1451   ADEQPAP  02/26/2023 1634    Satisfactory for evaluation; transformation zone component ABSENT.   ADEQPAP  05/04/2020 1451    Satisfactory for evaluation; transformation zone component PRESENT.   ADEQPAP  09/28/2016 0000    Satisfactory for evaluation  endocervical/transformation zone component ABSENT.   PCP is Gloria Zarwolo NP  Review of Systems +vulva irritation for about 3 weeks, has taken valtrex  and felt better maybe 1 day.   No sex in 11-12 years Reviewed past medical,surgical, social and family history. Reviewed medications and allergies.  Objective:   Physical Exam BP 114/75 (BP Location: Left Arm, Patient Position: Sitting, Cuff Size: Normal)   Pulse 83   Ht 5' 4 (1.626 m)   Wt 130 lb (59 kg)   LMP 06/18/2013   BMI 22.31 kg/m     Skin warm and dry.Pelvic: external genitalia is normal in appearance,small red area right labia,  vagina: white discharge without odor,feels irritated at introitus she says,urethra has no lesions or masses noted, cervix:smooth, uterus: normal size, shape and contour, non tender, no masses felt, adnexa: no masses or tenderness noted. Bladder is non tender and no masses felt. Fall risk is high  Upstream - 12/04/23 1449        Pregnancy Intention Screening   Does the patient want to become pregnant in the next year? N/A    Does the patient's partner want to become pregnant in the next year? N/A    Would the patient like to discuss contraceptive options today? N/A      Contraception Wrap Up   Current Method Abstinence   PM   End Method Abstinence   PM   Contraception Counseling Provided No         Examination chaperoned by Clarita Salt LPN  Assessment:     1. Vulvar irritation (Primary) +vulva irritation for about 3 weeks, has taken valtrex  and felt better maybe 1 day.   Will rx nystatin  and triamcinolone  ointment to use together Meds ordered this encounter  Medications   triamcinolone  ointment (KENALOG ) 0.5 %    Sig: Apply 1 Application topically 2 (two) times daily. AS needed for vulva irritation, can use with nystatin     Dispense:  30 g    Refill:  0    Supervising Provider:   JAYNE MINDER H [[id number]]   nystatin  ointment (MYCOSTATIN )    Sig: Apply 1 Application topically 2 (two) times daily.    Dispense:  30 g    Refill:  0    Supervising Provider:   JAYNE MINDER DEL [[id number]]    Plan:     Follow up prn

## 2023-12-05 ENCOUNTER — Ambulatory Visit: Admitting: Podiatry

## 2023-12-06 ENCOUNTER — Telehealth: Payer: Self-pay

## 2023-12-06 ENCOUNTER — Other Ambulatory Visit: Payer: Self-pay

## 2023-12-06 NOTE — Progress Notes (Signed)
   12/06/2023  Patient ID: Kathryn Oconnell, female   DOB: July 16, 1965, 58 y.o.   MRN: [id number]  Attempted patient for 3p televisit. Previously met with patient to update medication list and discuss pharmacy-related options for organizing and obtaining medications. The purpose of today's call was to to discuss these once she had more time to consider.   Appears that patient is now opting to have meds organized/delivered through [facility]. If she wishes to discuss further, I would be happy to help otherwise no need to schedule f/u at this time.   Lang Sieve, PharmD, BCGP Clinical Pharmacist  [phone]

## 2023-12-14 ENCOUNTER — Telehealth: Payer: Self-pay | Admitting: *Deleted

## 2023-12-14 NOTE — Transitions of Care (Post Inpatient/ED Visit) (Signed)
 12/14/2023  Name: Kathryn Oconnell MRN: [id number] DOB: Jul 23, 1965  Today's TOC FU Call Status: Today's TOC FU Call Status:: Successful TOC FU Call Completed TOC FU Call Complete Date: 12/14/23 Patient's Name and Date of Birth confirmed.  Transition Care Management Follow-up Telephone Call Date of Discharge: 12/13/23 Discharge Facility: Other [facility]) Name of Other ([facility]) Discharge Facility: [facility] Type of Discharge: Inpatient Admission Primary Inpatient Discharge Diagnosis:: medication adverse effect How have you been since you were released from the hospital?: Better (eating, drinking well, ambulating without difficulty, will be going back to work soon) Any questions or concerns?: No  Items Reviewed: Did you receive and understand the discharge instructions provided?: Yes Medications obtained,verified, and reconciled?: Yes (Medications Reviewed) Any new allergies since your discharge?: No Dietary orders reviewed?: Yes Type of Diet Ordered:: heart healthy, carbohydrate modified Do you have support at home?: Yes People in Home [RPT]: child(ren), adult Name of Support/Comfort Primary Source: adult son Reviewed importance of eating 3 meals per day plus snacks (pt states she had not eaten much the day she went to ED) and feels this was part of her issue)  pt has insulin  pump Reviewed carbohydrate modified diet Reviewed due to pt taking opiates, benzodiazepenes, anti-depressants, the mixture can have side effects such as drowsiness, somnolence, confusion, etc.  Instructed pt to take medications only as prescribed and be mindful not to take much or too close together for prn medications Pt declines further outreach, pt is going back to work  Medications Reviewed Today: Medications Reviewed Today     Reviewed by Aura Mliss LABOR, RN (Registered Nurse) on 12/14/23 at 1130  Med List Status: <None>   Medication Order Taking? Sig Documenting Provider Last Dose Status  Informant  amphetamine-dextroamphetamine (ADDERALL) 5 MG tablet [id number] Yes Take 1 tablet by mouth daily. [provider]  Active   aspirin 81 MG tablet [id number] Yes Take 81 mg by mouth daily. [provider]  Active   atorvastatin  (LIPITOR) 80 MG tablet [id number] Yes TAKE 1 TABLET BY MOUTH EVERY DAY Zarwolo, Gloria, FNP  Active   Azelaic Acid  15 % gel [id number] Yes APPLY TO AFFECTED AREAS FACE EVERY MORNING. Jackquline Sawyer, MD  Active   Continuous Blood Gluc Sensor (DEXCOM G6 SENSOR) MISC [id number] Yes 1 Device by Does not apply route as directed. de [place], Raymond J, MD  Active   Continuous Blood Gluc Transmit (DEXCOM G6 TRANSMITTER) MISC [id number] Yes 1 Device by Does not apply route as directed. de [place], Raymond J, MD  Active   cyclobenzaprine  (FLEXERIL ) 10 MG tablet [id number] Yes Take 1 tablet (10 mg total) by mouth 3 (three) times daily as needed. de [place], Raymond J, MD  Active   glucose blood test strip [id number] Yes 1 each by Other route 4 (four) times daily. Use as instructed to check blood sugar four times daily. [provider]  Active   GVOKE HYPOPEN 2-PACK 0.5 MG/0.1ML EMMANUEL [id number] Yes Inject 0.5 mg into the skin. [provider]  Active   HYDROmorphone (DILAUDID) 4 MG tablet [id number] Yes Take 4 mg by mouth 3 (three) times daily as needed. [provider]  Active   Beryle HERDER [id number] Yes Apply 1 Application topically at bedtime. [provider]  Active   insulin  glargine (LANTUS  SOLOSTAR) 100 UNIT/ML Solostar Pen [id number] Yes Inject 15 Units into the skin daily. de [place], Raymond J, MD  Active   insulin  lispro (HUMALOG ) 100 UNIT/ML KwikPen [id number] Yes PLEASE SEE ATTACHED FOR  DETAILED DIRECTIONS [provider]  Active   Insulin  Pen Needle (BD PEN NEEDLE MICRO U/F) 32G X 6 MM MISC [id number] Yes USE AS DIRECTED de [place], Raymond J, MD  Active   ipratropium (ATROVENT ) 0.06 % nasal spray [id number] Yes PLACE  2 SPRAYS INTO THE NOSE 3 (THREE) TIMES DAILY. de [place], Raymond J, MD  Active   levothyroxine  (SYNTHROID ) 112 MCG tablet [id number] Yes Take 112 mcg by mouth every morning. [provider]  Active   loratadine  (CLARITIN ) 10 MG tablet [id number] Yes Take 10 mg by mouth daily. [provider]  Active   LORazepam  (ATIVAN ) 1 MG tablet [id number] Yes Take 1 tablet (1 mg total) by mouth 3 (three) times daily as needed for anxiety. Zarwolo, Gloria, FNP  Active   metoprolol  tartrate (LOPRESSOR ) 25 MG tablet [id number] Yes Take 1 tablet (25 mg total) by mouth 2 (two) times daily. de [place], Raymond J, MD  Active   minoxidil  (LONITEN ) 2.5 MG tablet [id number] Yes Take 1 tablet (2.5 mg total) by mouth daily. Jackquline Sawyer, MD  Active   MOUNJARO 2.5 MG/0.5ML Pen [id number] Yes SMARTSIG:2.5 Milligram(s) SUB-Q Once a Week [provider]  Active   nystatin  ointment (MYCOSTATIN ) [id number] Yes Apply 1 Application topically 2 (two) times daily. Signa Nest A, NP  Active   omeprazole (PRILOSEC) 40 MG capsule [id number] Yes Take 40 mg by mouth every morning. [provider]  Active   pregabalin  (LYRICA ) 25 MG capsule [id number] Yes Take 25 mg by mouth 4 (four) times daily. [provider]  Active   sertraline  (ZOLOFT ) 100 MG tablet [id number] Yes Take 1 tablet (100 mg total) by mouth 2 (two) times daily. Zarwolo, Gloria, FNP  Active   sodium bicarbonate 650 MG tablet [id number] Yes Take 650 mg by mouth 2 (two) times daily. [provider]  Active   tacrolimus  (PROTOPIC ) 0.1 % ointment [id number] Yes Apply topically 2 (two) times daily. Jackquline Sawyer, MD  Active   triamcinolone  (KENALOG ) 0.025 % cream [id number] Yes APPLY TOPICALLY TWICE DAILY FOR 1 WEEK TO DARK SPOTS AT FACE. Moye, Virginia , MD  Active   triamcinolone  ointment (KENALOG ) 0.5 % [id number] Yes Apply 1 Application topically 2 (two) times daily. AS needed for vulva irritation, can use with nystatin  Griffin, Jennifer  A, NP  Active   valACYclovir  (VALTREX ) 1000 MG tablet [id number] Yes Take 1 tablet (1,000 mg total) by mouth 2 (two) times daily. X 7 days Signa Nest A, NP  Active   Vitamin D , Ergocalciferol , (DRISDOL ) 1.25 MG (50000 UNIT) CAPS capsule [id number] Yes Take 1 capsule (50,000 Units total) by mouth every 7 (seven) days. Zarwolo, Gloria, FNP  Active             Home Care and Equipment/Supplies: Were Home Health Services Ordered?: No Any new equipment or medical supplies ordered?: No  Functional Questionnaire: Do you need assistance with bathing/showering or dressing?: No Do you need assistance with meal preparation?: No Do you need assistance with eating?: No Do you have difficulty maintaining continence: No Do you need assistance with getting out of bed/getting out of a chair/moving?: No  Follow up appointments reviewed: PCP Follow-up appointment confirmed?: Yes (collaborated with care guide, scheduled post hospital follow up) Date of PCP follow-up appointment?: 12/24/23 Follow-up Provider: Gloria Zarwolo   @ 1 pm Specialist Hospital Follow-up appointment confirmed?: NA Do you need transportation to your follow-up appointment?: No Do you understand care options if your condition(s) worsen?: Yes-patient verbalized understanding  SDOH Interventions  Today    Flowsheet Row Most Recent Value  SDOH Interventions   Food Insecurity Interventions Intervention Not Indicated  Housing Interventions Intervention Not Indicated  Transportation Interventions Intervention Not Indicated  Utilities Interventions Intervention Not Indicated    Mliss Creed [facility], BSN RN Care Manager/ Transition of Care / [facility] Population Health [phone]

## 2023-12-24 ENCOUNTER — Inpatient Hospital Stay: Admitting: Family Medicine

## 2023-12-31 NOTE — Progress Notes (Signed)
 DIVISION OF CARDIOLOGY [facility] Cardiology Clinic Note Date of Service: 12/31/23    PCP: Referring Provider:  Johnnye Neale Carpen, NP [phone] [facility] [place] [address] Phone: [phone] Fax: [phone] Richarda Prentice Ruth, FNP [address] [place],  [place] [address] Phone: [phone] Fax: [phone]     H&P: Kathryn Oconnell is a 58 y.o. year-old female w/ a PMHx significant for anxiety, chronic pain, hypertension, mixed hyperlipidemia, type 2 diabetes, chronic kidney disease stage 3, hypothyroidism, COPD, depression, and tobacco use who was admitted to the ICU on 12/12/2023 for evaluation of acute altered mental status and somnolence. She presents today for initial evaluation post hospitalization.   She had an acute onset of somnolence and altered mental status after taking her home medications, with coworkers reporting extreme lethargy and weakness. On arrival, she was obtunded and not oriented to person, place, or time. Urine drug screen was positive for opiates, and she received multiple doses of IV naloxone with improvement in mental status. There was concern for polypharmacy and possible overdose with prescribed pain medications (oxycodone /aspirin) and benzodiazepines (lorazepam ). CT head was negative for acute intracranial process. She was admitted to the ICU for close monitoring, neurochecks, and aspiration precautions. By 12/13/2023, she was alert and oriented x4, with no further cognitive or neurological deficits documented. No further episodes of altered awareness occurred during hospitalization. She had elevated troponins and EKGs showed normal sinus rhythm without acute ST or T wave changes, and the patient denied chest pain throughout admission. Cardiology was consulted, and a Lexiscan nuclear stress test and echocardiogram were planned  to evaluate for ischemic or structural heart disease, given her multiple risk factors (diabetes, hypertension, hyperlipidemia, tobacco use). She was monitored on telemetry, and no arrhythmias or ischemic changes were noted during her stay. NST was ordered and yet to be done  Subjective/Interval Events 12/13/23: -Feels well has no complaints or further episodes since discharge - Awaiting nuclear stress test which is planned for October 14  ROS  Negative aside from that described above    HOME MEDICATIONS   Current Outpatient Medications  Medication Instructions  . ACCU-CHEK GUIDE GLUCOSE METER Misc   . ACCU-CHEK GUIDE TEST STRIPS Strp   . ACCU-CHEK SOFTCLIX LANCETS lancets   . aspirin (ECOTRIN) 81 MG tablet Take by mouth.  . atorvastatin  (LIPITOR) 20 MG tablet Take by mouth.  . azelaic acid  15 % gel 2 times a day (standard)  . BD NANO 2ND GEN PEN NEEDLE 32 gauge x 5/32 (4 mm) Ndle   . DEXCOM G6 SENSOR Devi Use as directed.  SABRA GLASER G6 TRANSMITTER Devi Use as directed.  SABRA dextroamphetamine-amphetamine (ADDERALL XR) 5 MG 24 hr capsule 5 mg, Every morning  . ergocalciferol -1,250 mcg, 50,000 unit, (DRISDOL ) 1,250 mcg (50,000 unit) capsule   . fluticasone  propionate (FLONASE ) 50 mcg/actuation nasal spray 2 sprays  . halobetasol  (ULTRAVATE ) 0.05 % cream 2 times a day (standard)  . HYDROmorphone (DILAUDID) 4 mg, 3 times a day PRN  . insulin  aspart (NOVOLOG  FLEXPEN) 5-11 Units  . insulin  glargine (LANTUS   SOLOSTAR U-100 INSULIN ) 15 Units  . ipratropium (ATROVENT  HFA) 17 mcg/actuation inhaler 2 puffs, Every 6 hours  . levothyroxine  (SYNTHROID ) 100 mcg  . LINZESS  290 mcg, Daily before breakfast  . LORazepam  (ATIVAN ) 1 mg, Every 6 hours PRN  . medroxyPROGESTERone  (PROVERA ) 2.5 MG tablet 1 tablet, Daily (standard)  . metoPROLOL  tartrate (LOPRESSOR ) 25 mg, 2 times a day (standard)  . minoxidil  (LONITEN ) 2.5 mg, Daily (standard)  . NOVOLOG  FLEXPEN U-100 INSULIN  100 unit/mL injection pen No  dose, route, or frequency recorded.  SABRA ofloxacin (OCUFLOX) 0.3 % ophthalmic solution 1 drop, 4 times a day  . oxycodone  HCl,terephth/aspirin (OXYCODONE  HCL-OXYCODONE -ASA ORAL) 10 mg, 4 times a day  . prednisoLONE acetate (PRED FORTE) 1 % ophthalmic suspension 1 drop, 2 times a day (standard)  . promethazine  (PHENERGAN ) 25 mg, Every 6 hours PRN  . RETIN-A  0.05 % cream Nightly  . sertraline  200 mg cap   . sodium bicarbonate 650 mg tablet   . triamcinolone  (KENALOG ) 0.025 % cream 2 times a day (standard)  . valACYclovir  (VALTREX ) 1,000 mg, Every 12 hours      ALLERGIES: Allergies[1]     PHYSICAL EXAM: Vitals:   12/31/23 1044  BP: 156/76  Pulse: 82  Resp: 16  Temp: 37.2 C (99 F)     GEN: Awake, alert, NAD CV: RRR, normal S1 and S2, no obvious MRG PULM: CTAB anteriorly and posteriorly, no increased WOB EXTREMITIES: WWP, no lower extremity edema, distal pulses intact SKIN: No obvious lower extremity rashes or ulcers. NEUROLOGIC: Alert, interactive, and appropriate, grossly moving all 4 extremities.     OBJECTIVE DATA   Lab Results  Component Value Date   WBC 6.8 12/13/2023   HGB 11.7 12/13/2023   HCT 35.7 12/13/2023   PLT 186 12/13/2023    Lab Results  Component Value Date   NA 140 12/13/2023   K 3.5 12/13/2023   CL 108 (H) 12/13/2023   CO2 21.8 12/13/2023   BUN 15 12/13/2023   CREATININE 1.46 (H) 12/13/2023   GLU 184 (H) 12/13/2023   CALCIUM  8.1 (L) 12/13/2023   MG 2.0 12/13/2023   PHOS 2.6 12/13/2023    Lab Results  Component Value Date   BILITOT 0.4 12/13/2023   PROT 5.6 (L) 12/13/2023   ALBUMIN 2.4 (L) 12/13/2023   ALT 17 12/13/2023   AST 20 12/13/2023   ALKPHOS 73 12/13/2023    Lab Results  Component Value Date   PT 10.5 11/05/2022   INR 0.94 11/05/2022     CARDIODIAGNOSTICS  ECG 12/31/23:  Normal sinus rhythm, right atrial enlargement     TTE 12/13/23 Summary   1. The left ventricle is normal in size with normal wall thickness.   2.  The left ventricular systolic function is normal, LVEF is visually estimated at > 55%.   3. The right ventricle is normal in size, with normal systolic function.   4. There is mild to moderate aortic regurgitation.   5. The mitral valve leaflets are mildly thickened with moderately reduced leaflet mobility.   6. There is mild mitral valve regurgitation.   RECOMMENDATIONS  Elevated troponins -Follow-up nuclear stress test planned from discharging hospitalization on January 15, 2024 - c/w with aspirin 81 mg  Mild to moderate aortic regurgitation - EF on TTE 12/13/2023 shows normal EF >55% - Patient currently asymptomatic - Will monitor with serial echoes and evaluate symptomatology each visit  HTN -bp 156/76  -start amlodipine  10 mg every day, losartan 25 mg every  day  HLD -c/w atorvastatin  20 mg qHS  Diabetes mellitus - Continue regimen per PCP  Return to clinic in 3 to 6 months or earlier depending on stress test results   Please contact us  for any questions.     I personally spent 30 minutes face-to-face and non-face-to-face in the care of this patient, which includes all pre, intra, and post visit time on the date of service.  All documented time was specific to the E/M visit and does not include any procedures that may have been performed.   Dr. Ozell Stands [facility] Cardiology         [1] Allergies Allergen Reactions  . Erythromycin Itching and Other (See Comments)    Stomach cramps  Stomach cramps  Stomach cramps  Stomach cramps, Stomach cramps  . Sulfa (Sulfonamide Antibiotics) Other (See Comments)    Causes stomach cramps  . Sumatriptan Other (See Comments)    Migraine and increased BP  . Topiramate Nausea And Vomiting and Other (See Comments)    Nausea and worsening  . House Dust

## 2024-01-01 ENCOUNTER — Inpatient Hospital Stay

## 2024-01-01 LAB — PROTEIN / CREATININE RATIO, URINE
Albumin, U: 25.6
Creatinine, Urine: 30.8

## 2024-01-01 LAB — MICROALBUMIN / CREATININE URINE RATIO: Microalb Creat Ratio: 83

## 2024-01-02 ENCOUNTER — Ambulatory Visit: Admitting: Dermatology

## 2024-01-02 LAB — MICROALBUMIN / CREATININE URINE RATIO
Albumin, Urine POC: 25.6
Creatinine, POC: 30.8 mg/dL
Microalb Creat Ratio: 83

## 2024-01-10 ENCOUNTER — Other Ambulatory Visit: Payer: Self-pay | Admitting: Medical Genetics

## 2024-01-10 LAB — HEMOGLOBIN A1C: Hemoglobin A1C: 10.5

## 2024-01-12 ENCOUNTER — Other Ambulatory Visit: Payer: Self-pay | Admitting: Dermatology

## 2024-01-12 DIAGNOSIS — L438 Other lichen planus: Secondary | ICD-10-CM

## 2024-01-17 ENCOUNTER — Ambulatory Visit: Admitting: Nurse Practitioner

## 2024-01-24 ENCOUNTER — Other Ambulatory Visit: Payer: Self-pay

## 2024-01-24 DIAGNOSIS — L438 Other lichen planus: Secondary | ICD-10-CM

## 2024-01-24 MED ORDER — AZELAIC ACID 15 % EX GEL
CUTANEOUS | 0 refills | Status: AC
Start: 2024-01-24 — End: ?

## 2024-01-24 NOTE — Progress Notes (Signed)
 Transfer of Pharmacy per fax request. aw

## 2024-01-28 ENCOUNTER — Other Ambulatory Visit: Payer: Self-pay | Admitting: *Deleted

## 2024-01-28 MED ORDER — VALACYCLOVIR HCL 1 G PO TABS: 1000.0000 mg | ORAL_TABLET | Freq: Two times a day (BID) | ORAL | 3 refills | Status: DC

## 2024-01-30 ENCOUNTER — Other Ambulatory Visit: Payer: Self-pay

## 2024-01-30 DIAGNOSIS — L661 Lichen planopilaris, unspecified: Secondary | ICD-10-CM

## 2024-01-30 MED ORDER — MINOXIDIL 2.5 MG PO TABS: 2.5000 mg | ORAL_TABLET | Freq: Every day | ORAL | 1 refills | Status: DC

## 2024-01-30 NOTE — Progress Notes (Signed)
 Patient requests Minoxidil  2.5 mg 1 po every day 90 day, 1 Rf to be sent to new mail order pharmacy.  Done.

## 2024-02-14 ENCOUNTER — Encounter: Payer: Self-pay | Admitting: Internal Medicine

## 2024-02-14 ENCOUNTER — Ambulatory Visit: Payer: Self-pay | Admitting: Internal Medicine

## 2024-02-14 VITALS — BP 138/80 | HR 86 | Ht 64.0 in | Wt 136.8 lb

## 2024-02-14 DIAGNOSIS — N1831 Chronic kidney disease, stage 3a: Secondary | ICD-10-CM

## 2024-02-14 DIAGNOSIS — Z09 Encounter for follow-up examination after completed treatment for conditions other than malignant neoplasm: Secondary | ICD-10-CM | POA: Diagnosis not present

## 2024-02-14 DIAGNOSIS — R7989 Other specified abnormal findings of blood chemistry: Secondary | ICD-10-CM | POA: Insufficient documentation

## 2024-02-14 DIAGNOSIS — R4182 Altered mental status, unspecified: Secondary | ICD-10-CM | POA: Diagnosis not present

## 2024-02-14 DIAGNOSIS — B351 Tinea unguium: Secondary | ICD-10-CM | POA: Insufficient documentation

## 2024-02-14 DIAGNOSIS — E1122 Type 2 diabetes mellitus with diabetic chronic kidney disease: Secondary | ICD-10-CM

## 2024-02-14 DIAGNOSIS — Z794 Long term (current) use of insulin: Secondary | ICD-10-CM

## 2024-02-14 DIAGNOSIS — L239 Allergic contact dermatitis, unspecified cause: Secondary | ICD-10-CM | POA: Insufficient documentation

## 2024-02-14 MED ORDER — LEVOCETIRIZINE DIHYDROCHLORIDE 5 MG PO TABS: 5.0000 mg | ORAL_TABLET | Freq: Every evening | ORAL | 3 refills | Status: AC

## 2024-02-14 NOTE — Assessment & Plan Note (Signed)
 Hospital chart reviewed, including discharge summary Mental status at baseline now

## 2024-02-14 NOTE — Assessment & Plan Note (Signed)
 Reports generalized itching, likely due to allergic dermatitis Started Xyzal 5 mg QD instead of Claritin  for now Kenalog  cream as needed for itching Has a history of lichen sclerosis and rosacea, followed by dermatology

## 2024-02-14 NOTE — Assessment & Plan Note (Signed)
 Last CMP reviewed from recent hospitalization CKD likely due to type II DM and HTN Followed by [facility] Advised to maintain adequate hydration

## 2024-02-14 NOTE — Patient Instructions (Addendum)
 Please apply lotion or moisturizer for dry skin.  Please take Xyzal as needed for allergic reaction.  Please continue to take other medications as prescribed.  Please continue to follow low carb diet and ambulate as tolerated.

## 2024-02-14 NOTE — Assessment & Plan Note (Signed)
 Had elevated troponin during recent hospitalization, could be related to demand ischemia Had cardiology evaluation in the outpatient setting including nuclear stress test, which was benign

## 2024-02-14 NOTE — Assessment & Plan Note (Signed)
 Has brownish discoloration of toenails Referred to podiatry - also for diabetic neuropathy

## 2024-02-14 NOTE — Assessment & Plan Note (Addendum)
 Had recent hospitalization for AMS/hypersomnolence [facility] chart reviewed, including discharge summary, was deemed to be due to polypharmacy (Dilaudid and BZD) Dose of Dilaudid recently reduced by pain clinic - she agrees to avoid taking BZD at the same time of Dilaudid

## 2024-02-14 NOTE — Assessment & Plan Note (Signed)
 Lab Results  Component Value Date   HGBA1C 10.5 01/10/2024   Uncontrolled, followed by endocrinology at [facility] Has insulin  pump now - has been deemed to be insulin -dependent now Advised to follow diabetic diet On statin Referred to podiatry for diabetic neuropathy and onychomycosis

## 2024-02-14 NOTE — Progress Notes (Signed)
 Established Patient Office Visit  Subjective:  Patient ID: Kathryn Oconnell, female    DOB: 07-29-1965  Age: 58 y.o. MRN: [id number]  CC:  Chief Complaint  Patient presents with   Hospitalization Follow-up    Follow up from hospital visit with [facility] on 01/15/24    HPI Kathryn Oconnell is a 58 y.o. female with past medical history of type I DM, HTN, hypothyroidism, HLD, chronic pain and COPD who presents for follow-up after recent hospitalization from 12/12/23-12/13/23.  She had an acute onset of somnolence and altered mental status after taking her home medications, with coworkers reporting extreme lethargy and weakness. On arrival, she was obtunded and not oriented to person, place, or time. Urine drug screen was positive for opiates, and she received multiple doses of IV naloxone with improvement in mental status. There was concern for polypharmacy and possible overdose with prescribed pain medications (oxycodone /aspirin) and benzodiazepines (lorazepam ). CT head was negative for acute intracranial process. She was admitted to the ICU for close monitoring, neurochecks, and aspiration precautions. By 12/13/2023, she was alert and oriented x4, with no further cognitive or neurological deficits documented. No further episodes of altered awareness occurred during hospitalization. She had elevated troponins and EKGs showed normal sinus rhythm without acute ST or T wave changes, and the patient denied chest pain throughout admission. Cardiology was consulted, and a Lexiscan nuclear stress test and echocardiogram were planned to evaluate for ischemic or structural heart disease, given her multiple risk factors (diabetes, hypertension, hyperlipidemia, tobacco use).  Her nuclear stress test was negative for ischemic changes in the outpatient setting.  She was monitored on telemetry, and no arrhythmias or ischemic changes were noted during her stay.  Her dose of Dilaudid is reduced to 4 mg BID instead of 3 times  daily now, followed by pain clinic.  She denies any recent episode of hypersomnolence or confusion since being discharged from the hospital.  Past Medical History:  Diagnosis Date   ADHD    Anxiety    ASCUS of cervix with negative high risk HPV 05/14/2020   05/2020 ASCUS negative HPV, repeat pap in 3 years with HPV, 5 year CIN3+ risk is 0.27% per ASCCP guidelines    Chronic kidney disease    COPD (chronic obstructive pulmonary disease) (HCC)    Depression    Diabetes mellitus without complication (HCC)    Eczema    Graves disease    HSV-2 infection    Hyperlipidemia    Hypertension    Hypothyroid    Lichen planus    Lung nodule    Menopausal vaginal dryness 02/24/2014   Migraines    Neuropathy    Tobacco use    Trouble in sleeping 06/25/2015    Past Surgical History:  Procedure Laterality Date   CATARACT EXTRACTION Left 11/2023   CESAREAN SECTION     x 2   FOOT SURGERY     5th digit left foot / Great toe, 2ND TOE   THYROID  SURGERY      Family History  Problem Relation Age of Onset   Diabetes Paternal Grandfather    Cancer Paternal Grandmother    Heart disease Maternal Grandmother    Heart attack Maternal Grandmother    Heart disease Maternal Grandfather    Heart attack Maternal Grandfather    Cancer Father        stomach cancer    Hypertension Father    Heart attack Father 69   Stroke Mother  x 3   Cancer Mother        breast    Thyroid  disease Mother    Hyperlipidemia Mother    Gout Brother    Asthma Daughter    Migraines Son    Heart attack Son    Hypertension Paternal Uncle     Social History   Socioeconomic History   Marital status: Widowed    Spouse name: Not on file   Number of children: Not on file   Years of education: Not on file   Highest education level: Not on file  Occupational History   Not on file  Tobacco Use   Smoking status: Every Day    Current packs/day: 0.50    Average packs/day: 0.5 packs/day for 35.0 years (17.5  ttl pk-yrs)    Types: Cigarettes   Smokeless tobacco: Never   Tobacco comments:    smokes 10 cig daily  Vaping Use   Vaping status: Former  Substance and Sexual Activity   Alcohol  use: No   Drug use: No   Sexual activity: Not Currently    Birth control/protection: Post-menopausal, Abstinence  Other Topics Concern   Not on file  Social History Narrative   Not on file   Social Drivers of Health   Financial Resource Strain: Low Risk (12/13/2023)   Received from [facility]   Overall Financial Resource Strain (CARDIA)    How hard is it for you to pay for the very basics like food, housing, medical care, and heating?: Not hard at all  Recent Concern: Financial Resource Strain - High Risk (10/02/2023)   Received from [facility]   Overall Financial Resource Strain (CARDIA)    Difficulty of Paying Living Expenses: Very hard  Food Insecurity: No Food Insecurity (12/14/2023)   Hunger Vital Sign    Worried About Running Out of Food in the Last Year: Never true    Ran Out of Food in the Last Year: Never true  Transportation Needs: No Transportation Needs (12/14/2023)   PRAPARE - Administrator, Civil Service (Medical): No    Lack of Transportation (Non-Medical): No  Recent Concern: Transportation Needs - Unmet Transportation Needs (10/02/2023)   Received from [facility] - Transportation    In the past 12 months, has lack of transportation kept you from medical appointments or from getting medications?: Yes    Lack of Transportation (Non-Medical): No  Physical Activity: Sufficiently Active (02/26/2023)   Exercise Vital Sign    Days of Exercise per Week: 5 days    Minutes of Exercise per Session: 30 min  Stress: Stress Concern Present (02/26/2023)   [employer] of Occupational [employer] - Occupational Stress Questionnaire    Feeling of Stress : To some extent  Social Connections: Moderately Isolated (02/26/2023)   Social  Connection and Isolation Panel    Frequency of Communication with Friends and Family: More than three times a week    Frequency of Social Gatherings with Friends and Family: More than three times a week    Attends Religious Services: 1 to 4 times per year    Active Member of [employer] or Organizations: No    Attends Banker Meetings: Patient declined    Marital Status: Widowed  Intimate Partner Violence: Not At Risk (12/14/2023)   Humiliation, Afraid, Rape, and Kick questionnaire    Fear of Current or Ex-Partner: No    Emotionally Abused: No    Physically Abused:  No    Sexually Abused: No    Outpatient Medications Prior to Visit  Medication Sig Dispense Refill   amphetamine-dextroamphetamine (ADDERALL) 5 MG tablet Take 1 tablet by mouth daily.     aspirin 81 MG tablet Take 81 mg by mouth daily.     atorvastatin  (LIPITOR) 80 MG tablet TAKE 1 TABLET BY MOUTH EVERY DAY 90 tablet 3   Azelaic Acid  15 % gel Apply to affected areas face every morning. 50 g 0   Continuous Blood Gluc Sensor (DEXCOM G6 SENSOR) MISC 1 Device by Does not apply route as directed. 3 each 11   Continuous Blood Gluc Transmit (DEXCOM G6 TRANSMITTER) MISC 1 Device by Does not apply route as directed. 1 each 11   cyclobenzaprine  (FLEXERIL ) 10 MG tablet Take 1 tablet (10 mg total) by mouth 3 (three) times daily as needed. 30 tablet 0   glucose blood test strip 1 each by Other route 4 (four) times daily. Use as instructed to check blood sugar four times daily.     GVOKE HYPOPEN 2-PACK 0.5 MG/0.1ML SOAJ Inject 0.5 mg into the skin.     HYDROmorphone (DILAUDID) 4 MG tablet Take 4 mg by mouth 3 (three) times daily as needed.     HYDROQUINONE-HC-TRETINOIN  EX Apply 1 Application topically at bedtime.     insulin  glargine (LANTUS  SOLOSTAR) 100 UNIT/ML Solostar Pen Inject 15 Units into the skin daily. 15 mL 1   insulin  lispro (HUMALOG ) 100 UNIT/ML KwikPen PLEASE SEE ATTACHED FOR DETAILED DIRECTIONS     Insulin  Pen Needle  (BD PEN NEEDLE MICRO U/F) 32G X 6 MM MISC USE AS DIRECTED 100 each 11   ipratropium (ATROVENT ) 0.06 % nasal spray PLACE 2 SPRAYS INTO THE NOSE 3 (THREE) TIMES DAILY. 15 mL 1   levothyroxine  (SYNTHROID ) 112 MCG tablet Take 112 mcg by mouth every morning.     LORazepam  (ATIVAN ) 1 MG tablet Take 1 tablet (1 mg total) by mouth 3 (three) times daily as needed for anxiety. 90 tablet 0   metoprolol  tartrate (LOPRESSOR ) 25 MG tablet Take 1 tablet (25 mg total) by mouth 2 (two) times daily. 180 tablet 0   minoxidil  (LONITEN ) 2.5 MG tablet Take 1 tablet (2.5 mg total) by mouth daily. 90 tablet 1   nystatin  ointment (MYCOSTATIN ) Apply 1 Application topically 2 (two) times daily. 30 g 0   omeprazole (PRILOSEC) 40 MG capsule Take 40 mg by mouth every morning.     pregabalin  (LYRICA ) 25 MG capsule Take 25 mg by mouth 4 (four) times daily.     sertraline  (ZOLOFT ) 100 MG tablet Take 1 tablet (100 mg total) by mouth 2 (two) times daily. 180 tablet 1   sodium bicarbonate 650 MG tablet Take 650 mg by mouth 2 (two) times daily.     tacrolimus  (PROTOPIC ) 0.1 % ointment Apply topically 2 (two) times daily. 30 g 2   triamcinolone  (KENALOG ) 0.025 % cream APPLY TOPICALLY TWICE DAILY FOR 1 WEEK TO DARK SPOTS AT FACE. 30 g 0   triamcinolone  ointment (KENALOG ) 0.5 % Apply 1 Application topically 2 (two) times daily. AS needed for vulva irritation, can use with nystatin  30 g 0   valACYclovir  (VALTREX ) 1000 MG tablet Take 1 tablet (1,000 mg total) by mouth 2 (two) times daily. X 7 days 20 tablet 3   Vitamin D , Ergocalciferol , (DRISDOL ) 1.25 MG (50000 UNIT) CAPS capsule Take 1 capsule (50,000 Units total) by mouth every 7 (seven) days. 5 capsule 10   loratadine  (CLARITIN )  10 MG tablet Take 10 mg by mouth daily.     MOUNJARO 2.5 MG/0.5ML Pen SMARTSIG:2.5 Milligram(s) SUB-Q Once a Week     No facility-administered medications prior to visit.    Allergies  Allergen Reactions   Erythromycin Other (See Comments)    Stomach  cramps   Imitrex [Sumatriptan] Other (See Comments)    Migraine and increased BP   Sulfa Antibiotics Other (See Comments)    Causes stomach cramps   Topamax [Topiramate] Nausea And Vomiting    Nausea and worsening    ROS Review of Systems  Constitutional:  Negative for chills and fever.  HENT:  Negative for congestion and sore throat.   Eyes:  Negative for pain and discharge.  Respiratory:  Negative for cough and shortness of breath.   Cardiovascular:  Negative for chest pain and palpitations.  Gastrointestinal:  Negative for abdominal pain, diarrhea, nausea and vomiting.  Endocrine: Negative for polydipsia and polyuria.  Genitourinary:  Negative for dysuria and hematuria.  Musculoskeletal:  Positive for arthralgias and back pain. Negative for neck pain and neck stiffness.  Skin:  Negative for rash.  Neurological:  Negative for dizziness and weakness.  Psychiatric/Behavioral:  Positive for sleep disturbance. Negative for agitation and behavioral problems. The patient is nervous/anxious.       Objective:    Physical Exam Vitals reviewed.  Constitutional:      General: She is not in acute distress.    Appearance: She is not diaphoretic.  HENT:     Head: Normocephalic and atraumatic.     Nose: Nose normal. No congestion.     Mouth/Throat:     Mouth: Mucous membranes are moist.     Pharynx: No posterior oropharyngeal erythema.  Eyes:     General: No scleral icterus.    Extraocular Movements: Extraocular movements intact.  Cardiovascular:     Rate and Rhythm: Normal rate and regular rhythm.     Heart sounds: Normal heart sounds. No murmur heard. Pulmonary:     Breath sounds: Normal breath sounds. No wheezing or rales.  Musculoskeletal:     Cervical back: Neck supple. No tenderness.     Right lower leg: No edema.     Left lower leg: No edema.  Skin:    General: Skin is warm.     Findings: No rash.  Neurological:     General: No focal deficit present.     Mental  Status: She is alert and oriented to person, place, and time.  Psychiatric:        Mood and Affect: Mood normal.        Behavior: Behavior normal.     BP 138/80 (BP Location: Left Arm)   Pulse 86   Ht 5' 4 (1.626 m)   Wt 136 lb 12.8 oz (62.1 kg)   LMP 06/18/2013   SpO2 97%   BMI 23.48 kg/m  Wt Readings from Last 3 Encounters:  02/14/24 136 lb 12.8 oz (62.1 kg)  12/04/23 130 lb (59 kg)  10/16/23 140 lb 12.8 oz (63.9 kg)    Lab Results  Component Value Date   TSH 3.240 01/24/2022   Lab Results  Component Value Date   WBC 9.7 01/24/2022   HGB 16.8 (H) 01/24/2022   HCT 48.2 (H) 01/24/2022   MCV 103 (H) 01/24/2022   PLT 240 01/24/2022   Lab Results  Component Value Date   NA 140 01/24/2022   K 5.0 01/24/2022   CO2 17 (L) 01/24/2022  GLUCOSE 215 (H) 01/24/2022   BUN 29 (H) 01/24/2022   CREATININE 1.64 (H) 01/24/2022   BILITOT 0.4 01/24/2022   ALKPHOS 101 01/24/2022   AST 28 01/24/2022   ALT 16 01/24/2022   PROT 8.0 01/24/2022   ALBUMIN 4.9 01/24/2022   CALCIUM  10.2 01/24/2022   ANIONGAP 9 08/11/2020   EGFR 37 (L) 01/24/2022   GFR 50.82 (L) 01/22/2017   Lab Results  Component Value Date   CHOL 259 (H) 01/24/2022   Lab Results  Component Value Date   HDL 70 01/24/2022   Lab Results  Component Value Date   LDLCALC 165 (H) 01/24/2022   Lab Results  Component Value Date   TRIG 134 01/24/2022   Lab Results  Component Value Date   CHOLHDL 3.7 01/24/2022   Lab Results  Component Value Date   HGBA1C 10.5 01/10/2024      Assessment & Plan:   Problem List Items Addressed This Visit       Endocrine   Type 2 diabetes mellitus with diabetic chronic kidney disease (HCC)   Lab Results  Component Value Date   HGBA1C 10.5 01/10/2024   Uncontrolled, followed by endocrinology at [facility] Has insulin  pump now - has been deemed to be insulin -dependent now Advised to follow diabetic diet On statin Referred to podiatry for diabetic neuropathy and  onychomycosis      Relevant Orders   Microalbumin / creatinine urine ratio   Ambulatory referral to Podiatry     Musculoskeletal and Integument   Onychomycosis   Has brownish discoloration of toenails Referred to podiatry - also for diabetic neuropathy      Relevant Orders   Ambulatory referral to Podiatry   Allergic dermatitis   Reports generalized itching, likely due to allergic dermatitis Started Xyzal 5 mg QD instead of Claritin  for now Kenalog  cream as needed for itching Has a history of lichen sclerosis and rosacea, followed by dermatology      Relevant Medications   levocetirizine (XYZAL) 5 MG tablet     Genitourinary   CKD (chronic kidney disease) stage 3, GFR 30-59 ml/min (HCC)   Last CMP reviewed from recent hospitalization CKD likely due to type II DM and HTN Followed by [facility] Advised to maintain adequate hydration       Relevant Orders   Microalbumin / creatinine urine ratio     Other   Altered mental status - Primary   Had recent hospitalization for AMS/hypersomnolence - Hospital chart reviewed, including discharge summary, was deemed to be due to polypharmacy (Dilaudid and BZD) Dose of Dilaudid recently reduced by pain clinic - she agrees to avoid taking BZD at the same time of Dilaudid      Elevated troponin level   Had elevated troponin during recent hospitalization, could be related to demand ischemia Had cardiology evaluation in the outpatient setting including nuclear stress test, which was benign      Hospital discharge follow-up   Hospital chart reviewed, including discharge summary Mental status at baseline now       Meds ordered this encounter  Medications   levocetirizine (XYZAL) 5 MG tablet    Sig: Take 1 tablet (5 mg total) by mouth every evening.    Dispense:  30 tablet    Refill:  3    Follow-up: No follow-ups on file.    Suzzane MARLA Blanch, MD

## 2024-02-17 NOTE — Progress Notes (Deleted)
 Cardiology Office Note:    Date:  02/17/2024   ID:  Kathryn Oconnell, DOB March 19, 1966, MRN [id number]  PCP:  Edman Meade PEDLAR, FNP  Cardiologist:  None  Electrophysiologist:  None   Referring MD: Norine Manuelita LABOR, MD   No chief complaint on file. ***  History of Present Illness:    Kathryn Oconnell is a 58 y.o. female with a hx of hypertension, T2DM, hyperlipidemia, CKD stage III, COPD, hypothyroidism, tobacco use who was referred by Dr. Norine for evaluation of aortic regurgitation.  She was admitted at [facility] 12/2023 with altered mental status.  Cardiology was consulted for elevated troponins.  Recommended Lexiscan Myoview and echocardiogram.  Echocardiogram showed normal systolic function, mild to moderate aortic regurgitation.  Lexiscan Myoview 01/2024 showed normal perfusion.  Past Medical History:  Diagnosis Date   ADHD    Anxiety    ASCUS of cervix with negative high risk HPV 05/14/2020   05/2020 ASCUS negative HPV, repeat pap in 3 years with HPV, 5 year CIN3+ risk is 0.27% per ASCCP guidelines    Chronic kidney disease    COPD (chronic obstructive pulmonary disease) (HCC)    Depression    Diabetes mellitus without complication (HCC)    Eczema    Graves disease    HSV-2 infection    Hyperlipidemia    Hypertension    Hypothyroid    Lichen planus    Lung nodule    Menopausal vaginal dryness 02/24/2014   Migraines    Neuropathy    Tobacco use    Trouble in sleeping 06/25/2015    Past Surgical History:  Procedure Laterality Date   CATARACT EXTRACTION Left 11/2023   CESAREAN SECTION     x 2   FOOT SURGERY     5th digit left foot / Great toe, 2ND TOE   THYROID  SURGERY      Current Medications: No outpatient medications have been marked as taking for the 02/18/24 encounter (Appointment) with Kate Lonni CROME, MD.     Allergies:   Erythromycin, Imitrex [sumatriptan], Sulfa antibiotics, and Topamax [topiramate]   Social History   Socioeconomic  History   Marital status: Widowed    Spouse name: Not on file   Number of children: Not on file   Years of education: Not on file   Highest education level: Not on file  Occupational History   Not on file  Tobacco Use   Smoking status: Every Day    Current packs/day: 0.50    Average packs/day: 0.5 packs/day for 35.0 years (17.5 ttl pk-yrs)    Types: Cigarettes   Smokeless tobacco: Never   Tobacco comments:    smokes 10 cig daily  Vaping Use   Vaping status: Former  Substance and Sexual Activity   Alcohol  use: No   Drug use: No   Sexual activity: Not Currently    Birth control/protection: Post-menopausal, Abstinence  Other Topics Concern   Not on file  Social History Narrative   Not on file   Social Drivers of Health   Financial Resource Strain: Low Risk (12/13/2023)   Received from [facility]   Overall Financial Resource Strain (CARDIA)    How hard is it for you to pay for the very basics like food, housing, medical care, and heating?: Not hard at all  Recent Concern: Financial Resource Strain - High Risk (10/02/2023)   Received from [facility]   Overall Financial Resource Strain (CARDIA)    Difficulty of  Paying Living Expenses: Very hard  Food Insecurity: No Food Insecurity (12/14/2023)   Hunger Vital Sign    Worried About Running Out of Food in the Last Year: Never true    Ran Out of Food in the Last Year: Never true  Transportation Needs: No Transportation Needs (12/14/2023)   PRAPARE - Administrator, Civil Service (Medical): No    Lack of Transportation (Non-Medical): No  Recent Concern: Transportation Needs - Unmet Transportation Needs (10/02/2023)   Received from [facility] - Transportation    In the past 12 months, has lack of transportation kept you from medical appointments or from getting medications?: Yes    Lack of Transportation (Non-Medical): No  Physical Activity: Sufficiently Active  (02/26/2023)   Exercise Vital Sign    Days of Exercise per Week: 5 days    Minutes of Exercise per Session: 30 min  Stress: Stress Concern Present (02/26/2023)   [employer] of Occupational [employer] - Occupational Stress Questionnaire    Feeling of Stress : To some extent  Social Connections: Moderately Isolated (02/26/2023)   Social Connection and Isolation Panel    Frequency of Communication with Friends and Family: More than three times a week    Frequency of Social Gatherings with Friends and Family: More than three times a week    Attends Religious Services: 1 to 4 times per year    Active Member of [employer] or Organizations: No    Attends Banker Meetings: Patient declined    Marital Status: Widowed     Family History: The patient's ***family history includes Asthma in her daughter; Cancer in her father, mother, and paternal grandmother; Diabetes in her paternal grandfather; Gout in her brother; Heart attack in her maternal grandfather, maternal grandmother, and son; Heart attack (age of onset: 71) in her father; Heart disease in her maternal grandfather and maternal grandmother; Hyperlipidemia in her mother; Hypertension in her father and paternal uncle; Migraines in her son; Stroke in her mother; Thyroid  disease in her mother.  ROS:   Please see the history of present illness.    *** All other systems reviewed and are negative.  EKGs/Labs/Other Studies Reviewed:    The following studies were reviewed today: ***  EKG:  EKG is *** ordered today.  The ekg ordered today demonstrates ***  Recent Labs: No results found for requested labs within last 365 days.  Recent Lipid Panel    Component Value Date/Time   CHOL 259 (H) 01/24/2022 1615   TRIG 134 01/24/2022 1615   HDL 70 01/24/2022 1615   CHOLHDL 3.7 01/24/2022 1615   CHOLHDL 3.5 08/11/2020 1642   VLDL 22 08/11/2020 1642   LDLCALC 165 (H) 01/24/2022 1615   LDLCALC 110 (H) 10/08/2019 1445   LDLDIRECT 95  10/04/2015 1427    Physical Exam:    VS:  LMP 06/18/2013     Wt Readings from Last 3 Encounters:  02/14/24 136 lb 12.8 oz (62.1 kg)  12/04/23 130 lb (59 kg)  10/16/23 140 lb 12.8 oz (63.9 kg)     GEN: *** Well nourished, well developed in no acute distress HEENT: Normal NECK: No JVD; No carotid bruits LYMPHATICS: No lymphadenopathy CARDIAC: ***RRR, no murmurs, rubs, gallops RESPIRATORY:  Clear to auscultation without rales, wheezing or rhonchi  ABDOMEN: Soft, non-tender, non-distended MUSCULOSKELETAL:  No edema; No deformity  SKIN: Warm and dry NEUROLOGIC:  Alert and oriented x 3 PSYCHIATRIC:  Normal affect   ASSESSMENT:  No diagnosis found. PLAN:    Aortic regurgitation: Mild to moderate on echocardiogram 12/13/2023.  Will monitor, plan to repeat echocardiogram in 1 year  Hypertension: On losartan 25 mg daily, amlodipine  10 mg daily  Hyperlipidemia: On atorvastatin  20 mg daily  T2DM:  RTC in***   Medication Adjustments/Labs and Tests Ordered: Current medicines are reviewed at length with the patient today.  Concerns regarding medicines are outlined above.  No orders of the defined types were placed in this encounter.  No orders of the defined types were placed in this encounter.   There are no Patient Instructions on file for this visit.   Signed, Lonni LITTIE Nanas, MD  02/17/2024 9:18 PM     [facility]

## 2024-02-18 ENCOUNTER — Ambulatory Visit: Attending: Cardiology | Admitting: Cardiology

## 2024-02-21 ENCOUNTER — Other Ambulatory Visit

## 2024-02-21 ENCOUNTER — Other Ambulatory Visit: Payer: Self-pay | Admitting: Family Medicine

## 2024-02-21 MED ORDER — SERTRALINE HCL 100 MG PO TABS: 100.0000 mg | ORAL_TABLET | Freq: Two times a day (BID) | ORAL | 1 refills | Status: AC

## 2024-02-21 NOTE — Telephone Encounter (Signed)
 Copied from CRM [id number]. Topic: Clinical - Medication Refill >> Feb 21, 2024  3:12 PM Jasmin G wrote: Medication: sertraline  (ZOLOFT ) 100 MG tablet  Has the patient contacted their pharmacy? Yes (Agent: If no, request that the patient contact the pharmacy for the refill. If patient does not wish to contact the pharmacy document the reason why and proceed with request.) (Agent: If yes, when and what did the pharmacy advise?)  This is the patient's preferred pharmacy:  [facility] - [place], [place] - [address] [place] [address] Phone: [phone] Fax: [phone]  Is this the correct pharmacy for this prescription? Yes If no, delete pharmacy and type the correct one.   Has the prescription been filled recently? Yes  Is the patient out of the medication? Yes  Has the patient been seen for an appointment in the last year OR does the patient have an upcoming appointment? Yes  Can we respond through MyChart? No  Agent: Please be advised that Rx refills may take up to 3 business days. We ask that you follow-up with your pharmacy.

## 2024-03-03 ENCOUNTER — Ambulatory Visit: Admitting: Family Medicine

## 2024-03-04 ENCOUNTER — Other Ambulatory Visit (HOSPITAL_COMMUNITY)

## 2024-03-05 ENCOUNTER — Ambulatory Visit (INDEPENDENT_AMBULATORY_CARE_PROVIDER_SITE_OTHER): Payer: Self-pay | Admitting: Podiatry

## 2024-03-05 ENCOUNTER — Encounter: Payer: Self-pay | Admitting: Podiatry

## 2024-03-05 DIAGNOSIS — M79672 Pain in left foot: Secondary | ICD-10-CM | POA: Diagnosis not present

## 2024-03-05 DIAGNOSIS — B351 Tinea unguium: Secondary | ICD-10-CM

## 2024-03-05 DIAGNOSIS — M79671 Pain in right foot: Secondary | ICD-10-CM | POA: Diagnosis not present

## 2024-03-05 MED ORDER — CICLOPIROX 8 % EX SOLN: Freq: Every day | CUTANEOUS | 0 refills | Status: DC

## 2024-03-05 MED ORDER — CICLOPIROX 8 % EX SOLN: Freq: Every day | CUTANEOUS | 9 refills | Status: AC

## 2024-03-05 NOTE — Progress Notes (Signed)
 Patient presents with complaint of painful nails 1 through 5 bilaterally with discoloration and thickening.  Also complains of hyperkeratotic lesions on the feet bilaterally.  These been getting worse.  She has stage III kidney disease.   Physical exam:  General appearance: Pleasant, and in no acute distress. AOx3.  Vascular: Pedal pulses: DP 2/4 bilaterally, PT 2/4 bilaterally.  Minimal edema lower legs bilaterally. Capillary fill time immediate bilaterally.  Neurological: Light touch intact feet bilaterally.  Normal Achilles reflex bilaterally.  No clonus or spasticity noted.  [place]  Dermatologic:   Thick dystrophic and mycotic nails 1 through 5 bilaterally with discoloration and subungual debris and curvature of the nails.  Tenderness with pressure on the nails.  Skin normal temperature bilaterally.  Skin normal color, tone, and texture bilaterally.  Keratotic lesions on the lateral aspect of the fifth metatarsal base left and under the hallux bilaterally.  Musculoskeletal: Hammertoes 2 through 5 bilaterally.  Met adductus bilaterally.  Normal muscle strength lower extremity bilaterally    Diagnosis: 1.  Onychomycosis 1 through 5 bilaterally 2.  Pain in feet bilaterally.  Plan: -New patient office visit for evaluation and management level 3. - Discussed with the onychomycotic nails.  With the kidney disease we cannot use oral Lamisil so instead we will try topicals.  Will do a prescription for Penlac social product uses for about 4 months and will reassess if it is not working we will switch her to another 1. -Discussed hyperkeratotic lesions we debrided these today.  She can use pumice stone or similar abrasive on these.  Keep the skin well moisturized.  Wear thick socks with padding to cushion these areas off. -Dispense silicone sleeve for the great toe right to help offload hyperkeratotic lesion   Return 4 months follow-up nail fungus

## 2024-03-10 ENCOUNTER — Other Ambulatory Visit: Payer: Self-pay | Admitting: Adult Health

## 2024-03-17 ENCOUNTER — Other Ambulatory Visit: Payer: Self-pay

## 2024-03-17 DIAGNOSIS — L438 Other lichen planus: Secondary | ICD-10-CM

## 2024-03-17 MED ORDER — AZELAIC ACID 15 % EX GEL: CUTANEOUS | 6 refills | Status: AC

## 2024-03-17 NOTE — Progress Notes (Signed)
Fax received requesting refills

## 2024-03-18 ENCOUNTER — Ambulatory Visit: Admitting: Dermatology

## 2024-04-07 ENCOUNTER — Other Ambulatory Visit (HOSPITAL_COMMUNITY)

## 2024-04-07 ENCOUNTER — Other Ambulatory Visit: Payer: Self-pay | Admitting: Medical Genetics

## 2024-04-07 DIAGNOSIS — Z006 Encounter for examination for normal comparison and control in clinical research program: Secondary | ICD-10-CM

## 2024-04-25 ENCOUNTER — Ambulatory Visit

## 2024-04-25 ENCOUNTER — Other Ambulatory Visit: Payer: Self-pay | Admitting: Dermatology

## 2024-04-25 DIAGNOSIS — L661 Lichen planopilaris, unspecified: Secondary | ICD-10-CM

## 2024-04-30 ENCOUNTER — Encounter: Payer: Self-pay | Admitting: Internal Medicine

## 2024-04-30 ENCOUNTER — Ambulatory Visit: Admitting: Internal Medicine

## 2024-04-30 VITALS — BP 138/64 | HR 58 | Ht 64.0 in | Wt 142.8 lb

## 2024-04-30 DIAGNOSIS — I351 Nonrheumatic aortic (valve) insufficiency: Secondary | ICD-10-CM

## 2024-04-30 DIAGNOSIS — I5A Non-ischemic myocardial injury (non-traumatic): Secondary | ICD-10-CM | POA: Diagnosis not present

## 2024-04-30 DIAGNOSIS — Z136 Encounter for screening for cardiovascular disorders: Secondary | ICD-10-CM

## 2024-04-30 NOTE — Patient Instructions (Addendum)
 Medication Instructions:  Your physician recommends that you continue on your current medications as directed. Please refer to the Current Medication list given to you today.   Labwork: None  Testing/Procedures: Your physician has requested that you have an echocardiogram in a year. Echocardiography is a painless test that uses sound waves to create images of your heart. It provides your doctor with information about the size and shape of your heart and how well your heart's chambers and valves are working. This procedure takes approximately one hour. There are no restrictions for this procedure. Please do NOT wear cologne, perfume, aftershave, or lotions (deodorant is allowed). Please arrive 15 minutes prior to your appointment time.  Please note: We ask at that you not bring children with you during ultrasound (echo/ vascular) testing. Due to room size and safety concerns, children are not allowed in the ultrasound rooms during exams. Our front office staff cannot provide observation of children in our lobby area while testing is being conducted. An adult accompanying a patient to their appointment will only be allowed in the ultrasound room at the discretion of the ultrasound technician under special circumstances. We apologize for any inconvenience.   Follow-Up: Your physician recommends that you schedule a follow-up appointment in: 1 year. You will receive a reminder call in about 8 months reminding you to schedule your appointment. If you don't receive this call, please contact our office.   Any Other Special Instructions Will Be Listed Below (If Applicable). Thank you for choosing [facility]!     If you need a refill on your cardiac medications before your next appointment, please call your pharmacy.

## 2024-04-30 NOTE — Progress Notes (Unsigned)
 "    Cardiology Office Note  Date: 04/30/2024   ID: Kathryn Oconnell, DOB 26-Oct-1965, MRN [id number]  PCP:  Edman Meade PEDLAR, FNP  Cardiologist:  None Electrophysiologist:  None   History of Present Illness: Kathryn Oconnell is a 59 y.o. female  Past Medical History:  Diagnosis Date   ADHD    Anxiety    ASCUS of cervix with negative high risk HPV 05/14/2020   05/2020 ASCUS negative HPV, repeat pap in 3 years with HPV, 5 year CIN3+ risk is 0.27% per ASCCP guidelines    Chronic kidney disease    COPD (chronic obstructive pulmonary disease) (HCC)    Depression    Diabetes mellitus without complication (HCC)    Eczema    Graves disease    HSV-2 infection    Hyperlipidemia    Hypertension    Hypothyroid    Lichen planus    Lung nodule    Menopausal vaginal dryness 02/24/2014   Migraines    Neuropathy    Tobacco use    Trouble in sleeping 06/25/2015    Past Surgical History:  Procedure Laterality Date   CATARACT EXTRACTION Left 11/2023   CESAREAN SECTION     x 2   FOOT SURGERY     5th digit left foot / Great toe, 2ND TOE   THYROID  SURGERY      Current Outpatient Medications  Medication Sig Dispense Refill   amphetamine-dextroamphetamine (ADDERALL) 5 MG tablet Take 1 tablet by mouth daily.     aspirin 81 MG tablet Take 81 mg by mouth daily.     atorvastatin  (LIPITOR) 80 MG tablet TAKE 1 TABLET BY MOUTH EVERY DAY 90 tablet 3   Azelaic Acid  15 % gel Apply to affected areas face every morning. 50 g 6   Continuous Blood Gluc Sensor (DEXCOM G6 SENSOR) MISC 1 Device by Does not apply route as directed. 3 each 11   Continuous Blood Gluc Transmit (DEXCOM G6 TRANSMITTER) MISC 1 Device by Does not apply route as directed. 1 each 11   cyclobenzaprine  (FLEXERIL ) 10 MG tablet Take 1 tablet (10 mg total) by mouth 3 (three) times daily as needed. 30 tablet 0   glucose blood test strip 1 each by Other route 4 (four) times daily. Use as instructed to check blood sugar four times daily.      GVOKE HYPOPEN 2-PACK 0.5 MG/0.1ML SOAJ Inject 0.5 mg into the skin.     HYDROmorphone (DILAUDID) 4 MG tablet Take 4 mg by mouth 3 (three) times daily as needed.     HYDROQUINONE-HC-TRETINOIN  EX Apply 1 Application topically at bedtime.     insulin  glargine (LANTUS  SOLOSTAR) 100 UNIT/ML Solostar Pen Inject 15 Units into the skin daily. 15 mL 1   insulin  lispro (HUMALOG ) 100 UNIT/ML KwikPen PLEASE SEE ATTACHED FOR DETAILED DIRECTIONS     Insulin  Pen Needle (BD PEN NEEDLE MICRO U/F) 32G X 6 MM MISC USE AS DIRECTED 100 each 11   ipratropium (ATROVENT ) 0.06 % nasal spray PLACE 2 SPRAYS INTO THE NOSE 3 (THREE) TIMES DAILY. 15 mL 1   levocetirizine (XYZAL ) 5 MG tablet Take 1 tablet (5 mg total) by mouth every evening. 30 tablet 3   levothyroxine  (SYNTHROID ) 112 MCG tablet Take 112 mcg by mouth every morning.     LORazepam  (ATIVAN ) 1 MG tablet Take 1 tablet (1 mg total) by mouth 3 (three) times daily as needed for anxiety. 90 tablet 0   metoprolol  tartrate (LOPRESSOR ) 25 MG tablet  Take 1 tablet (25 mg total) by mouth 2 (two) times daily. 180 tablet 0   minoxidil  (LONITEN ) 2.5 MG tablet TAKE 1 TABLET BY MOUTH EVERY DAY 30 tablet 0   nystatin  ointment (MYCOSTATIN ) APPLY TO AFFECTED AREA TWICE A DAY 30 g 0   omeprazole (PRILOSEC) 40 MG capsule Take 40 mg by mouth every morning.     pregabalin  (LYRICA ) 25 MG capsule Take 25 mg by mouth 4 (four) times daily.     sertraline  (ZOLOFT ) 100 MG tablet Take 1 tablet (100 mg total) by mouth 2 (two) times daily. 180 tablet 1   sodium bicarbonate 650 MG tablet Take 650 mg by mouth 2 (two) times daily.     tacrolimus  (PROTOPIC ) 0.1 % ointment Apply topically 2 (two) times daily. 30 g 2   triamcinolone  (KENALOG ) 0.025 % cream APPLY TOPICALLY TWICE DAILY FOR 1 WEEK TO DARK SPOTS AT FACE. 30 g 0   triamcinolone  ointment (KENALOG ) 0.5 % APPLY 1 APPLICATION TOPICALLY 2 (TWO) TIMES DAILY. AS NEEDED FOR VULVA IRRITATION, CAN USE WITH NYSTATIN  30 g 0   valACYclovir  (VALTREX )  1000 MG tablet TAKE 1 TABLET BY MOUTH TWICE A DAY FOR 7 DAYS 20 tablet 1   Vitamin D , Ergocalciferol , (DRISDOL ) 1.25 MG (50000 UNIT) CAPS capsule Take 1 capsule (50,000 Units total) by mouth every 7 (seven) days. 5 capsule 10   No current facility-administered medications for this visit.   Allergies:  Erythromycin, Imitrex [sumatriptan], Sulfa antibiotics, and Topamax [topiramate]   Social History: The patient  reports that she has been smoking cigarettes. She has a 17.5 pack-year smoking history. She has never used smokeless tobacco. She reports that she does not drink alcohol  and does not use drugs.   Family History: The patient's family history includes Asthma in her daughter; Cancer in her father, mother, and paternal grandmother; Diabetes in her paternal grandfather; Gout in her brother; Heart attack in her maternal grandfather, maternal grandmother, and son; Heart attack (age of onset: 23) in her father; Heart disease in her maternal grandfather and maternal grandmother; Hyperlipidemia in her mother; Hypertension in her father and paternal uncle; Migraines in her son; Stroke in her mother; Thyroid  disease in her mother.   ROS:  Please see the history of present illness. Otherwise, complete review of systems is positive for none  All other systems are reviewed and negative.   Physical Exam: VS:  BP 138/64   Ht 5' 4 (1.626 m)   Wt 142 lb 12.8 oz (64.8 kg)   LMP 06/18/2013   BMI 24.51 kg/m , BMI Body mass index is 24.51 kg/m.  Wt Readings from Last 3 Encounters:  04/30/24 142 lb 12.8 oz (64.8 kg)  02/14/24 136 lb 12.8 oz (62.1 kg)  12/04/23 130 lb (59 kg)    General: Patient appears comfortable at rest. HEENT: Conjunctiva and lids normal, oropharynx clear with moist mucosa. Neck: Supple, no elevated JVP or carotid bruits, no thyromegaly. Lungs: Clear to auscultation, nonlabored breathing at rest. Cardiac: Regular rate and rhythm, no S3 or significant systolic murmur, no pericardial  rub. Abdomen: Soft, nontender, no hepatomegaly, bowel sounds present, no guarding or rebound. Extremities: No pitting edema, distal pulses 2+. Skin: Warm and dry. Musculoskeletal: No kyphosis. Neuropsychiatric: Alert and oriented x3, affect grossly appropriate.  Recent Labwork: No results found for requested labs within last 365 days.     Component Value Date/Time   CHOL 259 (H) 01/24/2022 1615   TRIG 134 01/24/2022 1615   HDL 70 01/24/2022  1615   CHOLHDL 3.7 01/24/2022 1615   CHOLHDL 3.5 08/11/2020 1642   VLDL 22 08/11/2020 1642   LDLCALC 165 (H) 01/24/2022 1615   LDLCALC 110 (H) 10/08/2019 1445   LDLDIRECT 95 10/04/2015 1427    Other Studies Reviewed Today:   Assessment and Plan:         Medication Adjustments/Labs and Tests Ordered: Current medicines are reviewed at length with the patient today.  Concerns regarding medicines are outlined above.    Disposition:  Follow up {follow up:15908}  Signed Bettina Warn Priya Demitris Pokorny, MD, 04/30/2024 4:15 PM    [facility] at [facility] [address] [place], [place], [place] [address]  "

## 2024-05-01 DIAGNOSIS — I5A Non-ischemic myocardial injury (non-traumatic): Secondary | ICD-10-CM | POA: Insufficient documentation

## 2024-05-01 DIAGNOSIS — I351 Nonrheumatic aortic (valve) insufficiency: Secondary | ICD-10-CM | POA: Insufficient documentation

## 2024-07-04 ENCOUNTER — Ambulatory Visit: Admitting: Podiatry
